# Patient Record
Sex: Female | Born: 1955
Health system: Southern US, Community
[De-identification: ages and names within clinical notes are randomized; demographics above are authoritative.]

## PROBLEM LIST (undated history)

## (undated) DIAGNOSIS — K5792 Diverticulitis of intestine, part unspecified, without perforation or abscess without bleeding: Secondary | ICD-10-CM

## (undated) DIAGNOSIS — K224 Dyskinesia of esophagus: Secondary | ICD-10-CM

## (undated) DIAGNOSIS — Z9882 Breast implant status: Secondary | ICD-10-CM

## (undated) DIAGNOSIS — R011 Cardiac murmur, unspecified: Secondary | ICD-10-CM

## (undated) DIAGNOSIS — E039 Hypothyroidism, unspecified: Secondary | ICD-10-CM

## (undated) DIAGNOSIS — F909 Attention-deficit hyperactivity disorder, unspecified type: Secondary | ICD-10-CM

## (undated) DIAGNOSIS — Z5189 Encounter for other specified aftercare: Secondary | ICD-10-CM

## (undated) DIAGNOSIS — G8929 Other chronic pain: Secondary | ICD-10-CM

## (undated) DIAGNOSIS — Z9889 Other specified postprocedural states: Secondary | ICD-10-CM

## (undated) DIAGNOSIS — I219 Acute myocardial infarction, unspecified: Secondary | ICD-10-CM

## (undated) DIAGNOSIS — F32A Depression, unspecified: Secondary | ICD-10-CM

## (undated) DIAGNOSIS — N3010 Interstitial cystitis (chronic) without hematuria: Secondary | ICD-10-CM

## (undated) DIAGNOSIS — M1990 Unspecified osteoarthritis, unspecified site: Secondary | ICD-10-CM

## (undated) DIAGNOSIS — G610 Guillain-Barre syndrome: Secondary | ICD-10-CM

## (undated) DIAGNOSIS — I341 Nonrheumatic mitral (valve) prolapse: Secondary | ICD-10-CM

## (undated) DIAGNOSIS — N189 Chronic kidney disease, unspecified: Secondary | ICD-10-CM

## (undated) DIAGNOSIS — F419 Anxiety disorder, unspecified: Secondary | ICD-10-CM

## (undated) DIAGNOSIS — T7840XA Allergy, unspecified, initial encounter: Secondary | ICD-10-CM

## (undated) DIAGNOSIS — I639 Cerebral infarction, unspecified: Secondary | ICD-10-CM

## (undated) DIAGNOSIS — I10 Essential (primary) hypertension: Secondary | ICD-10-CM

## (undated) DIAGNOSIS — K219 Gastro-esophageal reflux disease without esophagitis: Secondary | ICD-10-CM

## (undated) DIAGNOSIS — R519 Headache, unspecified: Secondary | ICD-10-CM

## (undated) DIAGNOSIS — D649 Anemia, unspecified: Secondary | ICD-10-CM

## (undated) DIAGNOSIS — F09 Unspecified mental disorder due to known physiological condition: Secondary | ICD-10-CM

## (undated) DIAGNOSIS — B019 Varicella without complication: Secondary | ICD-10-CM

## (undated) DIAGNOSIS — R51 Headache: Secondary | ICD-10-CM

## (undated) DIAGNOSIS — J45909 Unspecified asthma, uncomplicated: Secondary | ICD-10-CM

## (undated) HISTORY — DX: Unspecified osteoarthritis, unspecified site: M19.90

## (undated) HISTORY — DX: Interstitial cystitis (chronic) without hematuria: N30.10

## (undated) HISTORY — DX: Other chronic pain: G89.29

## (undated) HISTORY — DX: Anxiety disorder, unspecified: F41.9

## (undated) HISTORY — DX: Headache, unspecified: R51.9

## (undated) HISTORY — PX: BELPHAROPTOSIS REPAIR: SHX369

## (undated) HISTORY — DX: Breast implant status: Z98.82

## (undated) HISTORY — PX: CARPAL TUNNEL RELEASE: SHX101

## (undated) HISTORY — PX: ABDOMINAL HYSTERECTOMY: SHX81

## (undated) HISTORY — DX: Dyskinesia of esophagus: K22.4

## (undated) HISTORY — PX: BREAST SURGERY: SHX581

## (undated) HISTORY — DX: Essential (primary) hypertension: I10

## (undated) HISTORY — DX: Acute myocardial infarction, unspecified: I21.9

## (undated) HISTORY — DX: Guillain-Barre syndrome: G61.0

## (undated) HISTORY — PX: TONSILLECTOMY: SUR1361

## (undated) HISTORY — DX: Headache: R51

## (undated) HISTORY — DX: Allergy, unspecified, initial encounter: T78.40XA

## (undated) HISTORY — DX: Varicella without complication: B01.9

## (undated) HISTORY — PX: COSMETIC SURGERY: SHX468

## (undated) HISTORY — PX: EYE SURGERY: SHX253

## (undated) HISTORY — PX: COLON SURGERY: SHX602

## (undated) HISTORY — DX: Hypothyroidism, unspecified: E03.9

## (undated) HISTORY — PX: TUBAL LIGATION: SHX77

## (undated) HISTORY — DX: Encounter for other specified aftercare: Z51.89

## (undated) HISTORY — DX: Depression, unspecified: F32.A

## (undated) HISTORY — PX: SPINE SURGERY: SHX786

## (undated) HISTORY — PX: AUGMENTATION MAMMAPLASTY: SUR837

---

## 1980-05-14 DIAGNOSIS — G610 Guillain-Barre syndrome: Secondary | ICD-10-CM

## 1980-05-14 HISTORY — DX: Guillain-Barre syndrome: G61.0

## 1983-05-15 DIAGNOSIS — I341 Nonrheumatic mitral (valve) prolapse: Secondary | ICD-10-CM

## 1983-05-15 HISTORY — DX: Nonrheumatic mitral (valve) prolapse: I34.1

## 2005-05-14 HISTORY — PX: VESICO-VAGINAL FISTULA REPAIR: SHX5129

## 2005-05-14 HISTORY — PX: BOWEL RESECTION: SHX1257

## 2005-05-14 HISTORY — PX: SMALL INTESTINE SURGERY: SHX150

## 2005-05-14 HISTORY — PX: APPENDECTOMY: SHX54

## 2005-08-12 DIAGNOSIS — K224 Dyskinesia of esophagus: Secondary | ICD-10-CM | POA: Insufficient documentation

## 2010-05-14 HISTORY — PX: CHOLECYSTECTOMY: SHX55

## 2013-03-17 ENCOUNTER — Encounter: Payer: Self-pay | Admitting: Emergency Medicine

## 2013-03-17 ENCOUNTER — Emergency Department: Payer: Managed Care, Other (non HMO)

## 2013-03-17 ENCOUNTER — Emergency Department (INDEPENDENT_AMBULATORY_CARE_PROVIDER_SITE_OTHER): Payer: Managed Care, Other (non HMO)

## 2013-03-17 ENCOUNTER — Emergency Department (INDEPENDENT_AMBULATORY_CARE_PROVIDER_SITE_OTHER)
Admission: EM | Admit: 2013-03-17 | Discharge: 2013-03-17 | Disposition: A | Discharge status: Home / Self Care | Payer: Managed Care, Other (non HMO) | Source: Home / Self Care | Attending: Emergency Medicine | Admitting: Emergency Medicine

## 2013-03-17 DIAGNOSIS — R079 Chest pain, unspecified: Secondary | ICD-10-CM

## 2013-03-17 DIAGNOSIS — R1011 Right upper quadrant pain: Secondary | ICD-10-CM

## 2013-03-17 DIAGNOSIS — R1013 Epigastric pain: Secondary | ICD-10-CM

## 2013-03-17 DIAGNOSIS — K2970 Gastritis, unspecified, without bleeding: Secondary | ICD-10-CM

## 2013-03-17 HISTORY — DX: Nonrheumatic mitral (valve) prolapse: I34.1

## 2013-03-17 HISTORY — DX: Unspecified asthma, uncomplicated: J45.909

## 2013-03-17 HISTORY — DX: Diverticulitis of intestine, part unspecified, without perforation or abscess without bleeding: K57.92

## 2013-03-17 HISTORY — DX: Gastro-esophageal reflux disease without esophagitis: K21.9

## 2013-03-17 LAB — POCT CBC W AUTO DIFF (K'VILLE URGENT CARE)

## 2013-03-17 LAB — POCT URINALYSIS DIP (MANUAL ENTRY)
Bilirubin, UA: NEGATIVE
Blood, UA: NEGATIVE
Glucose, UA: NEGATIVE
Leukocytes, UA: NEGATIVE
Nitrite, UA: NEGATIVE
Protein Ur, POC: NEGATIVE
Spec Grav, UA: 1.02 (ref 1.005–1.03)
Urobilinogen, UA: 0.2 (ref 0–1)
pH, UA: 7.5 (ref 5–8)

## 2013-03-17 MED ORDER — PROMETHAZINE HCL 25 MG/ML IJ SOLN
50.0000 mg | Freq: Once | INTRAMUSCULAR | Status: AC
  Administered 2013-03-17: 50 mg via INTRAMUSCULAR

## 2013-03-17 MED ORDER — KETOROLAC TROMETHAMINE 60 MG/2ML IM SOLN
60.0000 mg | Freq: Once | INTRAMUSCULAR | Status: AC
  Administered 2013-03-17: 60 mg via INTRAMUSCULAR

## 2013-03-17 NOTE — ED Notes (Signed)
15 minutes after medication administration she became diaphoretic and pale. Feet elevated, cold compress applied and given beverage. BP 116/74, HR 62 02 98%. Within 5 minutes her color returned to normal and she felt much improved.

## 2013-03-17 NOTE — ED Notes (Signed)
Pt c/o RUQ abd pain radiating to her back, SOB, nausea, LT sided chest pain, HA, LT arm burning, numbness in her extremities x 3wks, worse x 1 day. She took flexeril this morning with no relief.
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
## 2013-03-18 LAB — COMPLETE METABOLIC PANEL WITH GFR
ALT: <8 U/L (ref 0–35)
AST: 14 U/L (ref 0–37)
Albumin: 4.1 g/dL (ref 3.5–5.2)
Alkaline Phosphatase: 65 U/L (ref 39–117)
BUN: 12 mg/dL (ref 6–23)
CO2: 27 mEq/L (ref 19–32)
Calcium: 9 mg/dL (ref 8.4–10.5)
Chloride: 101 mEq/L (ref 96–112)
Creat: 0.89 mg/dL (ref 0.50–1.10)
GFR, Est African American: 83 mL/min
GFR, Est Non African American: 72 mL/min
Glucose, Bld: 79 mg/dL (ref 70–99)
Potassium: 4 mEq/L (ref 3.5–5.3)
Sodium: 138 mEq/L (ref 135–145)
Total Bilirubin: 0.4 mg/dL (ref 0.3–1.2)
Total Protein: 7 g/dL (ref 6.0–8.3)

## 2013-03-18 LAB — AMYLASE: Amylase: 38 U/L (ref 0–105)

## 2013-03-18 LAB — LIPASE: Lipase: <10 U/L (ref 0–75)

## 2013-03-21 ENCOUNTER — Telehealth: Payer: Self-pay | Admitting: Emergency Medicine

## 2014-05-14 HISTORY — PX: CARDIAC CATHETERIZATION: SHX172

## 2015-08-19 ENCOUNTER — Ambulatory Visit (INDEPENDENT_AMBULATORY_CARE_PROVIDER_SITE_OTHER): Payer: 59 | Admitting: Primary Care

## 2015-08-19 ENCOUNTER — Encounter: Payer: Self-pay | Admitting: Primary Care

## 2015-08-19 VITALS — BP 142/82 | HR 81 | Temp 98.3°F | Ht 63.0 in | Wt 147.8 lb

## 2015-08-19 DIAGNOSIS — K224 Dyskinesia of esophagus: Secondary | ICD-10-CM | POA: Diagnosis not present

## 2015-08-19 DIAGNOSIS — G8929 Other chronic pain: Secondary | ICD-10-CM | POA: Insufficient documentation

## 2015-08-19 DIAGNOSIS — E039 Hypothyroidism, unspecified: Secondary | ICD-10-CM

## 2015-08-19 DIAGNOSIS — K219 Gastro-esophageal reflux disease without esophagitis: Secondary | ICD-10-CM | POA: Diagnosis not present

## 2015-08-19 DIAGNOSIS — R519 Headache, unspecified: Secondary | ICD-10-CM | POA: Insufficient documentation

## 2015-08-19 DIAGNOSIS — J45909 Unspecified asthma, uncomplicated: Secondary | ICD-10-CM | POA: Insufficient documentation

## 2015-08-19 DIAGNOSIS — I10 Essential (primary) hypertension: Secondary | ICD-10-CM

## 2015-08-19 DIAGNOSIS — R51 Headache: Secondary | ICD-10-CM

## 2015-08-19 DIAGNOSIS — E2839 Other primary ovarian failure: Secondary | ICD-10-CM

## 2015-08-19 DIAGNOSIS — F09 Unspecified mental disorder due to known physiological condition: Secondary | ICD-10-CM | POA: Insufficient documentation

## 2015-08-19 MED ORDER — AMLODIPINE BESYLATE 10 MG PO TABS: 10.0000 mg | ORAL_TABLET | Freq: Every day | ORAL | Status: DC

## 2015-08-19 MED ORDER — OLMESARTAN MEDOXOMIL 20 MG PO TABS: 20.0000 mg | ORAL_TABLET | Freq: Two times a day (BID) | ORAL | Status: DC

## 2015-08-19 MED ORDER — PANTOPRAZOLE SODIUM 40 MG PO TBEC: 40.0000 mg | DELAYED_RELEASE_TABLET | Freq: Two times a day (BID) | ORAL | Status: DC

## 2015-08-19 NOTE — Assessment & Plan Note (Signed)
Managed on levothyroxine 100 mcg. Last TSH normal in July 2017. Continue current dose.

## 2015-08-19 NOTE — Assessment & Plan Note (Signed)
Long history of, also with nutcracker esophagus. Managed on pantoprazole 40 mg BID as her symptoms are severe.

## 2015-08-19 NOTE — Assessment & Plan Note (Signed)
Managed per GYN on Estradiol since 2009. Complete hysterectomy in 2009.

## 2015-08-19 NOTE — Assessment & Plan Note (Signed)
Managed on Benicar BID and Amlodipine 5 mg daily. BP slightly above goal today, patient has not had second dose of Benicar today, also feeling anxious. Will continue to monitor.

## 2015-08-19 NOTE — Progress Notes (Signed)
Pre visit review using our clinic review tool, if applicable. No additional management support is needed unless otherwise documented below in the visit note. 

## 2015-08-19 NOTE — Assessment & Plan Note (Addendum)
Sounds to be uncontrolled as she's using her albuterol 1-2 times daily. Offered to try other long acting inhalers, she declines. Exam without wheezing today. Follows with Pulmonology in [place].
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
## 2015-08-19 NOTE — Patient Instructions (Signed)
Refills of your medication were sent to [facility].  I will obtain your records for review of your most recent labs.  It was a pleasure to meet you today! Please don't hesitate to call me with any questions. Welcome to [facility]!
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
## 2015-10-13 ENCOUNTER — Encounter: Payer: Self-pay | Admitting: *Deleted

## 2015-10-13 ENCOUNTER — Encounter: Payer: Self-pay | Admitting: Primary Care

## 2015-10-13 ENCOUNTER — Ambulatory Visit (INDEPENDENT_AMBULATORY_CARE_PROVIDER_SITE_OTHER): Payer: 59 | Admitting: Primary Care

## 2015-10-13 VITALS — BP 122/78 | HR 88 | Temp 97.8°F | Ht 63.0 in | Wt 146.1 lb

## 2015-10-13 DIAGNOSIS — R1011 Right upper quadrant pain: Secondary | ICD-10-CM

## 2015-10-13 DIAGNOSIS — J4540 Moderate persistent asthma, uncomplicated: Secondary | ICD-10-CM | POA: Diagnosis not present

## 2015-10-13 LAB — CBC WITH DIFFERENTIAL/PLATELET
Basophils Absolute: 0 10*3/uL (ref 0.0–0.1)
Basophils Relative: 0.5 % (ref 0.0–3.0)
Eosinophils Absolute: 0.1 10*3/uL (ref 0.0–0.7)
Eosinophils Relative: 1 % (ref 0.0–5.0)
HCT: 35.2 % — ABNORMAL LOW (ref 36.0–46.0)
Hemoglobin: 11.7 g/dL — ABNORMAL LOW (ref 12.0–15.0)
Lymphocytes Relative: 43.4 % (ref 12.0–46.0)
Lymphs Abs: 2.4 10*3/uL (ref 0.7–4.0)
MCHC: 33.4 g/dL (ref 30.0–36.0)
MCV: 93.1 fl (ref 78.0–100.0)
Monocytes Absolute: 0.4 10*3/uL (ref 0.1–1.0)
Monocytes Relative: 7.4 % (ref 3.0–12.0)
Neutro Abs: 2.6 10*3/uL (ref 1.4–7.7)
Neutrophils Relative %: 47.7 % (ref 43.0–77.0)
Platelets: 263 10*3/uL (ref 150.0–400.0)
RBC: 3.78 Mil/uL — ABNORMAL LOW (ref 3.87–5.11)
RDW: 14.6 % (ref 11.5–15.5)
WBC: 5.6 10*3/uL (ref 4.0–10.5)

## 2015-10-13 LAB — COMPREHENSIVE METABOLIC PANEL
ALT: 13 U/L (ref 0–35)
AST: 18 U/L (ref 0–37)
Albumin: 4.3 g/dL (ref 3.5–5.2)
Alkaline Phosphatase: 69 U/L (ref 39–117)
BUN: 17 mg/dL (ref 6–23)
CO2: 28 mEq/L (ref 19–32)
Calcium: 9.2 mg/dL (ref 8.4–10.5)
Chloride: 102 mEq/L (ref 96–112)
Creatinine, Ser: 1.14 mg/dL (ref 0.40–1.20)
GFR: 51.67 mL/min — ABNORMAL LOW (ref >60.00)
Glucose, Bld: 73 mg/dL (ref 70–99)
Potassium: 4.3 mEq/L (ref 3.5–5.1)
Sodium: 137 mEq/L (ref 135–145)
Total Bilirubin: 0.3 mg/dL (ref 0.2–1.2)
Total Protein: 7.4 g/dL (ref 6.0–8.3)

## 2015-10-13 LAB — LIPASE: Lipase: 8 U/L — ABNORMAL LOW (ref 11.0–59.0)

## 2015-10-13 MED ORDER — BUDESONIDE 180 MCG/ACT IN AEPB: 1.0000 | INHALATION_SPRAY | Freq: Two times a day (BID) | RESPIRATORY_TRACT | Status: DC

## 2015-10-13 NOTE — Progress Notes (Signed)
Pre visit review using our clinic review tool, if applicable. No additional management support is needed unless otherwise documented below in the visit note. 

## 2015-10-13 NOTE — Patient Instructions (Signed)
Complete lab work prior to leaving today. I will notify you of your results once received.   Start Pulmicort inhaler. Inhale 1 puff into the lungs twice daily every day for asthma.   Use the albuterol inhaler only as needed for wheezing and/or shortness of breath.  Please call me if you experience any reactions similar to the [facility]. I will call you for an update in several weeks.  It was a pleasure to see you today!  Asthma, Adult Asthma is a recurring condition in which the airways tighten and narrow. Asthma can make it difficult to breathe. It can cause coughing, wheezing, and shortness of breath. Asthma episodes, also called asthma attacks, range from minor to life-threatening. Asthma cannot be cured, but medicines and lifestyle changes can help control it. CAUSES Asthma is believed to be caused by inherited (genetic) and environmental factors, but its exact cause is unknown. Asthma may be triggered by allergens, lung infections, or irritants in the air. Asthma triggers are different for each person. Common triggers include:   Animal dander.  Dust mites.  Cockroaches.  Pollen from trees or grass.  Mold.  Smoke.  Air pollutants such as dust, household cleaners, hair sprays, aerosol sprays, paint fumes, strong chemicals, or strong odors.  Cold air, weather changes, and winds (which increase molds and pollens in the air).  Strong emotional expressions such as crying or laughing hard.  Stress.  Certain medicines (such as aspirin) or types of drugs (such as beta-blockers).  Sulfites in foods and drinks. Foods and drinks that may contain sulfites include dried fruit, potato chips, and sparkling grape juice.  Infections or inflammatory conditions such as the flu, a cold, or an inflammation of the nasal membranes (rhinitis).  Gastroesophageal reflux disease (GERD).  Exercise or strenuous activity. SYMPTOMS Symptoms may occur immediately after asthma is triggered or many hours  later. Symptoms include:  Wheezing.  Excessive nighttime or early morning coughing.  Frequent or severe coughing with a common cold.  Chest tightness.  Shortness of breath. DIAGNOSIS  The diagnosis of asthma is made by a review of your medical history and a physical exam. Tests may also be performed. These may include:  Lung function studies. These tests show how much air you breathe in and out.  Allergy tests.  Imaging tests such as X-rays. TREATMENT  Asthma cannot be cured, but it can usually be controlled. Treatment involves identifying and avoiding your asthma triggers. It also involves medicines. There are 2 classes of medicine used for asthma treatment:   Controller medicines. These prevent asthma symptoms from occurring. They are usually taken every day.  Reliever or rescue medicines. These quickly relieve asthma symptoms. They are used as needed and provide short-term relief. Your health care provider will help you create an asthma action plan. An asthma action plan is a written plan for managing and treating your asthma attacks. It includes a list of your asthma triggers and how they may be avoided. It also includes information on when medicines should be taken and when their dosage should be changed. An action plan may also involve the use of a device called a peak flow meter. A peak flow meter measures how well the lungs are working. It helps you monitor your condition. HOME CARE INSTRUCTIONS   Take medicines only as directed by your health care provider. Speak with your health care provider if you have questions about how or when to take the medicines.  Use a peak flow meter as directed by  your health care provider. Record and keep track of readings.  Understand and use the action plan to help minimize or stop an asthma attack without needing to seek medical care.  Control your home environment in the following ways to help prevent asthma attacks:  Do not smoke. Avoid  being exposed to secondhand smoke.  Change your heating and air conditioning filter regularly.  Limit your use of fireplaces and wood stoves.  Get rid of pests (such as roaches and mice) and their droppings.  Throw away plants if you see mold on them.  Clean your floors and dust regularly. Use unscented cleaning products.  Try to have someone else vacuum for you regularly. Stay out of rooms while they are being vacuumed and for a short while afterward. If you vacuum, use a dust mask from a hardware store, a double-layered or microfilter vacuum cleaner bag, or a vacuum cleaner with a HEPA filter.  Replace carpet with wood, tile, or vinyl flooring. Carpet can trap dander and dust.  Use allergy-proof pillows, mattress covers, and box spring covers.  Wash bed sheets and blankets every week in hot water and dry them in a dryer.  Use blankets that are made of polyester or cotton.  Clean bathrooms and kitchens with bleach. If possible, have someone repaint the walls in these rooms with mold-resistant paint. Keep out of the rooms that are being cleaned and painted.  Wash hands frequently. SEEK MEDICAL CARE IF:   You have wheezing, shortness of breath, or a cough even if taking medicine to prevent attacks.  The colored mucus you cough up (sputum) is thicker than usual.  Your sputum changes from clear or white to yellow, green, gray, or bloody.  You have any problems that may be related to the medicines you are taking (such as a rash, itching, swelling, or trouble breathing).  You are using a reliever medicine more than 2-3 times per week.  Your peak flow is still at 50-79% of your personal best after following your action plan for 1 hour.  You have a fever. SEEK IMMEDIATE MEDICAL CARE IF:   You seem to be getting worse and are unresponsive to treatment during an asthma attack.  You are short of breath even at rest.  You get short of breath when doing very little physical  activity.  You have difficulty eating, drinking, or talking due to asthma symptoms.  You develop chest pain.  You develop a fast heartbeat.  You have a bluish color to your lips or fingernails.  You are light-headed, dizzy, or faint.  Your peak flow is less than 50% of your personal best.   This information is not intended to replace advice given to you by your health care provider. Make sure you discuss any questions you have with your health care provider.   Document Released: 04/30/2005 Document Revised: 01/19/2015 Document Reviewed: 11/27/2012 Elsevier Interactive Patient Education [employer].

## 2015-10-13 NOTE — Assessment & Plan Note (Signed)
Uncontrolled, using albuterol inhaler 4 times daily. Discussed importance of controller inhaler, she is agreeable. No wheezing on exam. Will start with low dose Pulmicort twice daily as she had a reaction to Breo in the past. Will follow up with her in 4 weeks for re-evaluation.

## 2015-10-13 NOTE — Progress Notes (Signed)
Subjective:    Patient ID: Katie Robbins, female    DOB: 11-Feb-1956, 60 y.o.   MRN: [id number]  HPI  Katie Robbins is a 60 year old female who presents today with a chief complaint shortness of breath and feeling "off". She follows with pulmonology in [facility] and was provided with a prescription of Breo several weeks ago. She developed a rash and swelling to her neck after she used it for several times. She has since stopped using.   She's noticed an increase in her shortness of breath intermittently for the past several months. She will become short of breath with vacuuming her house some days, and no problem other days. She is currently using her albuterol inhaler 4 times daily.  She doesn't feel very well and would like basic lab work. Yesterday she noticed RUQ abdominal pain that was sharp and nagging in nature. Today she's feeling better. Denies nausea, vomiting, constipation, diarrhea, bloody stools. She had a cholecystomy in the past.   Review of Systems  Constitutional: Positive for fatigue. Negative for fever.  HENT: Negative for rhinorrhea.   Respiratory: Positive for shortness of breath and wheezing.   Cardiovascular: Negative for chest pain.  Gastrointestinal: Negative for nausea, vomiting, abdominal pain, diarrhea and constipation.  Neurological: Negative for dizziness.       Past Medical History  Diagnosis Date  . Asthma   . Diverticulitis   . Hypothyroidism   . Mitral prolapse 1985  . GERD (gastroesophageal reflux disease)   . Interstitial cystitis   . H/O breast implant   . Arthritis   . Chicken pox   . Chronic headaches   . Essential hypertension   . Nutcracker esophagus      Social History   Social History  . Marital Status: Married    Spouse Name: N/A  . Number of Children: N/A  . Years of Education: N/A   Occupational History  . Not on file.   Social History Main Topics  . Smoking status: Never Smoker   . Smokeless tobacco: Not on file  .  Alcohol Use: Yes  . Drug Use: No  . Sexual Activity: Not on file   Other Topics Concern  . Not on file   Social History Narrative    Past Surgical History  Procedure Laterality Date  . Cholecystectomy  2012  . Appendectomy  2007  . Abdominal hysterectomy  2000/2009  . Tonsillectomy    . Small intestine surgery  2007    Bowel resection  . Cholecystectomy  2012  . Cardiac catheterization  2016    Clean  . Vesico-vaginal fistula repair  2007    Family History  Problem Relation Age of Onset  . Hypertension Mother   . GER disease Mother     Allergies  Allergen Reactions  . Nitrofurantoin Hives  . Sulfamethoxazole-Trimethoprim Hives  . Vicodin [Hydrocodone-Acetaminophen] Swelling    Puffy eyes and face  . Bactrim [Sulfamethoxazole-Trimethoprim]   . Cefdinir     Other reaction(s): Other (See Comments) Headache  . Hydrocodone   . Macrobid [Nitrofurantoin Macrocrystal]   . Sulfa Antibiotics   . Erythromycin Rash    Current Outpatient Prescriptions on File Prior to Visit  Medication Sig Dispense Refill  . albuterol (PROAIR HFA) 108 (90 BASE) MCG/ACT inhaler Inhale into the lungs every 6 (six) hours as needed for wheezing or shortness of breath.    . ALPRAZolam (XANAX) 0.5 MG tablet Take 0.5 mg by mouth daily as needed.    Marland Kitchen  amLODipine (NORVASC) 10 MG tablet Take 1 tablet (10 mg total) by mouth daily. 30 tablet 5  . amphetamine-dextroamphetamine (ADDERALL) 20 MG tablet Taking 1/2 to 1 tablet 4 times a day needed    . cyanocobalamin (,VITAMIN B-12,) 1000 MCG/ML injection Inject into the muscle.    . estradiol (ESTRACE) 1 MG tablet Take 1 mg by mouth daily.    Marland Kitchen levothyroxine (SYNTHROID, LEVOTHROID) 100 MCG tablet Take 100 mcg by mouth daily before breakfast.    . olmesartan (BENICAR) 20 MG tablet Take 1 tablet (20 mg total) by mouth 2 (two) times daily. 60 tablet 5  . oxyCODONE-acetaminophen (PERCOCET) 7.5-500 MG per tablet Take 1 tablet by mouth every 4 (four) hours as  needed for pain.    . pantoprazole (PROTONIX) 40 MG tablet Take 1 tablet (40 mg total) by mouth 2 (two) times daily. 60 tablet 5   No current facility-administered medications on file prior to visit.    BP 122/78 mmHg  Pulse 88  Temp(Src) 97.8 F (36.6 C) (Oral)  Ht 5\' 3"  (1.6 m)  Wt 146 lb 1.9 oz (66.28 kg)  BMI 25.89 kg/m2  SpO2 99%    Objective:   Physical Exam  Constitutional: She appears well-nourished.  HENT:  Right Ear: Tympanic membrane and ear canal normal.  Left Ear: Tympanic membrane and ear canal normal.  Nose: Right sinus exhibits no maxillary sinus tenderness and no frontal sinus tenderness. Left sinus exhibits no maxillary sinus tenderness and no frontal sinus tenderness.  Mouth/Throat: Oropharynx is clear and moist.  Eyes: Conjunctivae are normal.  Neck: Neck supple.  Cardiovascular: Normal rate and regular rhythm.   Pulmonary/Chest: Effort normal and breath sounds normal. She has no wheezes. She has no rales.  Abdominal: Soft. Bowel sounds are normal. There is no tenderness. There is no guarding, no tenderness at McBurney's point and negative Murphy's sign.  Lymphadenopathy:    She has no cervical adenopathy.  Skin: Skin is warm and dry.          Assessment & Plan:  Abdominal Pain:  Located to RUQ x 1 day. Improved today. History of cholecystomy. History of abdominal abscess in the past without any symptoms, so she is nervous that that will re-occur, especially since she's not feeling well. It is reasonable to obtain basic lab work today as it has been several years. CBC, CMP, Lipase pending. Exam unremarkable.

## 2015-10-25 ENCOUNTER — Other Ambulatory Visit: Payer: Self-pay | Admitting: Primary Care

## 2015-10-25 NOTE — Telephone Encounter (Signed)
Received faxed refill request from [facility] for  Modified Magic Mouthwash  #[id number]  Take 1 tablespoonful (15 mL's) every 4 to 6 hours as needed for pain or spasms.   Prescription has never been by [facility] yet. Last seen on 10/13/2015. No future appt.

## 2015-10-26 ENCOUNTER — Telehealth: Payer: Self-pay | Admitting: Primary Care

## 2015-10-26 MED ORDER — MAGIC MOUTHWASH: 15.0000 mL | Freq: Three times a day (TID) | ORAL | Status: DC | PRN

## 2015-10-26 NOTE — Telephone Encounter (Signed)
See previous refill encounter 

## 2015-10-26 NOTE — Telephone Encounter (Signed)
Message left for patient to return my call.  

## 2015-10-26 NOTE — Telephone Encounter (Signed)
Spoken to patient and she stated that medication was for her esophagus spasm.  Called in Rx to [facility].

## 2015-10-26 NOTE — Telephone Encounter (Signed)
Pt is returning your call, please call after lunch [phone] Thanks

## 2015-10-26 NOTE — Addendum Note (Signed)
Addended by: Jacqualin Combes on: 10/26/2015 12:57 PM   Modules accepted: Medications

## 2015-10-27 ENCOUNTER — Telehealth: Payer: Self-pay | Admitting: Primary Care

## 2015-10-27 NOTE — Telephone Encounter (Signed)
Message left for patient to return my call.  

## 2015-10-27 NOTE — Telephone Encounter (Signed)
-----   Message from Pleas Koch, NP sent at 10/13/2015  9:26 AM EDT ----- Regarding: Asthma Please check on Katie Robbins since we started her on Pulmicort for her asthma. Has she noticed a difference?

## 2015-10-28 NOTE — Telephone Encounter (Signed)
Message left for patient to return my call.  

## 2015-11-14 ENCOUNTER — Encounter: Payer: Self-pay | Admitting: Internal Medicine

## 2015-11-14 ENCOUNTER — Ambulatory Visit (INDEPENDENT_AMBULATORY_CARE_PROVIDER_SITE_OTHER): Payer: 59 | Admitting: Internal Medicine

## 2015-11-14 VITALS — BP 138/80 | HR 87 | Temp 97.2°F | Wt 152.0 lb

## 2015-11-14 DIAGNOSIS — N3001 Acute cystitis with hematuria: Secondary | ICD-10-CM | POA: Diagnosis not present

## 2015-11-14 DIAGNOSIS — R1084 Generalized abdominal pain: Secondary | ICD-10-CM | POA: Diagnosis not present

## 2015-11-14 DIAGNOSIS — N3000 Acute cystitis without hematuria: Secondary | ICD-10-CM | POA: Insufficient documentation

## 2015-11-14 DIAGNOSIS — R109 Unspecified abdominal pain: Secondary | ICD-10-CM | POA: Insufficient documentation

## 2015-11-14 LAB — POC URINALSYSI DIPSTICK (AUTOMATED)
Bilirubin, UA: NEGATIVE
Glucose, UA: NEGATIVE
Ketones, UA: NEGATIVE
Nitrite, UA: NEGATIVE
Protein, UA: NEGATIVE
Spec Grav, UA: 1.02
Urobilinogen, UA: 4
pH, UA: 6

## 2015-11-14 MED ORDER — CIPROFLOXACIN HCL 250 MG PO TABS: 250.0000 mg | ORAL_TABLET | Freq: Two times a day (BID) | ORAL | Status: DC

## 2015-11-14 NOTE — Addendum Note (Signed)
Addended by: Pilar Grammes on: 11/14/2015 03:29 PM   Modules accepted: Orders, SmartSet

## 2015-11-14 NOTE — Progress Notes (Signed)
Pre visit review using our clinic review tool, if applicable. No additional management support is needed unless otherwise documented below in the visit note. 

## 2015-11-14 NOTE — Assessment & Plan Note (Signed)
Generalized Has functional pain but is worse Due for colonoscopy  Wants to change from [facility]--- will refer here

## 2015-11-14 NOTE — Progress Notes (Signed)
Subjective:    Patient ID: Katie Robbins, female    DOB: Sep 26, 1955, 60 y.o.   MRN: [id number]  HPI Here due to urinary symptoms With friend Idelle Crouch she has UTI Was away last week Noticed some visible blood this AM (trace) Had pain "that goes up" into bladder after voiding Not really painful when urine coming out No fever but feels sweaty No urgency or increased frequency Doesn't usually have post coital sex  Sees Dr Jeffie Pollock regularly Gets scopes for IC  Current Outpatient Prescriptions on File Prior to Visit  Medication Sig Dispense Refill  . albuterol (PROAIR HFA) 108 (90 BASE) MCG/ACT inhaler Inhale into the lungs every 6 (six) hours as needed for wheezing or shortness of breath.    . ALPRAZolam (XANAX) 0.5 MG tablet Take 0.5 mg by mouth daily as needed.    Marland Kitchen amLODipine (NORVASC) 10 MG tablet Take 1 tablet (10 mg total) by mouth daily. 30 tablet 5  . amphetamine-dextroamphetamine (ADDERALL) 20 MG tablet Taking 1/2 to 1 tablet 4 times a day needed    . budesonide (PULMICORT) 180 MCG/ACT inhaler Inhale 1 puff into the lungs 2 (two) times daily. 1 Inhaler 5  . cyanocobalamin (,VITAMIN B-12,) 1000 MCG/ML injection Inject into the muscle.    . estradiol (ESTRACE) 1 MG tablet Take 1 mg by mouth daily.    Marland Kitchen levothyroxine (SYNTHROID, LEVOTHROID) 100 MCG tablet Take 100 mcg by mouth daily before breakfast.    . magic mouthwash SOLN Take 15 mLs by mouth 3 (three) times daily as needed for mouth pain. (Patient taking differently: Take 15 mLs by mouth 3 (three) times daily as needed for mouth pain (Modified Magic Mouthwash). ) 360 mL 0  . olmesartan (BENICAR) 20 MG tablet Take 1 tablet (20 mg total) by mouth 2 (two) times daily. 60 tablet 5  . oxyCODONE-acetaminophen (PERCOCET) 7.5-500 MG per tablet Take 1 tablet by mouth every 4 (four) hours as needed for pain.    . pantoprazole (PROTONIX) 40 MG tablet Take 1 tablet (40 mg total) by mouth 2 (two) times daily. 60 tablet 5   No  current facility-administered medications on file prior to visit.    Allergies  Allergen Reactions  . Nitrofurantoin Hives  . Sulfamethoxazole-Trimethoprim Hives  . Vicodin [Hydrocodone-Acetaminophen] Swelling    Puffy eyes and face  . Bactrim [Sulfamethoxazole-Trimethoprim]   . Cefdinir     Other reaction(s): Other (See Comments) Headache  . Hydrocodone   . Macrobid [Nitrofurantoin Macrocrystal]   . Sulfa Antibiotics   . Erythromycin Rash    Past Medical History  Diagnosis Date  . Asthma   . Diverticulitis   . Hypothyroidism   . Mitral prolapse 1985  . GERD (gastroesophageal reflux disease)   . Interstitial cystitis   . H/O breast implant   . Arthritis   . Chicken pox   . Chronic headaches   . Essential hypertension   . Nutcracker esophagus     Past Surgical History  Procedure Laterality Date  . Cholecystectomy  2012  . Appendectomy  2007  . Abdominal hysterectomy  2000/2009  . Tonsillectomy    . Small intestine surgery  2007    Bowel resection  . Cholecystectomy  2012  . Cardiac catheterization  2016    Clean  . Vesico-vaginal fistula repair  2007    Family History  Problem Relation Age of Onset  . Hypertension Mother   . GER disease Mother     Social History  Social History  . Marital Status: Married    Spouse Name: N/A  . Number of Children: N/A  . Years of Education: N/A   Occupational History  . Not on file.   Social History Main Topics  . Smoking status: Never Smoker   . Smokeless tobacco: Not on file  . Alcohol Use: Yes  . Drug Use: No  . Sexual Activity: Not on file   Other Topics Concern  . Not on file   Social History Narrative   Review of Systems Feels bloated but no N/V Ovaries are out Appetite is okay    Objective:   Physical Exam  Abdominal: Soft. She exhibits no distension. There is no rebound and no guarding.  Moderate tenderness around entire lower abdomen  Musculoskeletal:  No CVA tenderness            Assessment & Plan:

## 2015-11-14 NOTE — Patient Instructions (Signed)
Please start the antibiotic today and take 2 doses if you can. If your symptoms are gone by tomorrow--you can stop after 3 days.

## 2015-11-14 NOTE — Assessment & Plan Note (Signed)
Fairly classic presentation for bladder infection Trace blood and pain Will treat with antibiotic

## 2015-11-22 ENCOUNTER — Other Ambulatory Visit: Payer: Self-pay | Admitting: Primary Care

## 2015-11-22 DIAGNOSIS — E538 Deficiency of other specified B group vitamins: Secondary | ICD-10-CM

## 2015-11-22 DIAGNOSIS — D649 Anemia, unspecified: Secondary | ICD-10-CM

## 2015-11-22 DIAGNOSIS — Z0000 Encounter for general adult medical examination without abnormal findings: Secondary | ICD-10-CM

## 2015-11-22 DIAGNOSIS — I10 Essential (primary) hypertension: Secondary | ICD-10-CM

## 2015-11-22 DIAGNOSIS — E039 Hypothyroidism, unspecified: Secondary | ICD-10-CM

## 2015-11-23 ENCOUNTER — Other Ambulatory Visit (INDEPENDENT_AMBULATORY_CARE_PROVIDER_SITE_OTHER): Payer: 59

## 2015-11-23 DIAGNOSIS — E538 Deficiency of other specified B group vitamins: Secondary | ICD-10-CM

## 2015-11-23 DIAGNOSIS — E039 Hypothyroidism, unspecified: Secondary | ICD-10-CM | POA: Diagnosis not present

## 2015-11-23 DIAGNOSIS — D649 Anemia, unspecified: Secondary | ICD-10-CM

## 2015-11-23 DIAGNOSIS — Z0000 Encounter for general adult medical examination without abnormal findings: Secondary | ICD-10-CM | POA: Diagnosis not present

## 2015-11-23 DIAGNOSIS — I10 Essential (primary) hypertension: Secondary | ICD-10-CM | POA: Diagnosis not present

## 2015-11-23 LAB — COMPREHENSIVE METABOLIC PANEL
ALT: 12 U/L (ref 0–35)
AST: 18 U/L (ref 0–37)
Albumin: 4.1 g/dL (ref 3.5–5.2)
Alkaline Phosphatase: 72 U/L (ref 39–117)
BUN: 18 mg/dL (ref 6–23)
CO2: 28 mEq/L (ref 19–32)
Calcium: 9.2 mg/dL (ref 8.4–10.5)
Chloride: 99 mEq/L (ref 96–112)
Creatinine, Ser: 1.12 mg/dL (ref 0.40–1.20)
GFR: 52.72 mL/min — ABNORMAL LOW (ref >60.00)
Glucose, Bld: 101 mg/dL — ABNORMAL HIGH (ref 70–99)
Potassium: 4.5 mEq/L (ref 3.5–5.1)
Sodium: 135 mEq/L (ref 135–145)
Total Bilirubin: 0.4 mg/dL (ref 0.2–1.2)
Total Protein: 7.4 g/dL (ref 6.0–8.3)

## 2015-11-23 LAB — CBC
HCT: 34.7 % — ABNORMAL LOW (ref 36.0–46.0)
Hemoglobin: 11.8 g/dL — ABNORMAL LOW (ref 12.0–15.0)
MCHC: 34 g/dL (ref 30.0–36.0)
MCV: 92.5 fl (ref 78.0–100.0)
Platelets: 266 10*3/uL (ref 150.0–400.0)
RBC: 3.75 Mil/uL — ABNORMAL LOW (ref 3.87–5.11)
RDW: 13.5 % (ref 11.5–15.5)
WBC: 6.6 10*3/uL (ref 4.0–10.5)

## 2015-11-23 LAB — LIPID PANEL
Cholesterol: 202 mg/dL — ABNORMAL HIGH (ref 0–200)
HDL: 78.4 mg/dL (ref >39.00)
LDL Cholesterol: 107 mg/dL — ABNORMAL HIGH (ref 0–99)
NonHDL: 123.72
Total CHOL/HDL Ratio: 3
Triglycerides: 85 mg/dL (ref 0.0–149.0)
VLDL: 17 mg/dL (ref 0.0–40.0)

## 2015-11-23 LAB — HEMOGLOBIN A1C: Hgb A1c MFr Bld: 5.4 % (ref 4.6–6.5)

## 2015-11-23 LAB — TSH: TSH: 4.64 u[IU]/mL — ABNORMAL HIGH (ref 0.35–4.50)

## 2015-11-23 LAB — VITAMIN B12: Vitamin B-12: 998 pg/mL — ABNORMAL HIGH (ref 211–911)

## 2015-11-28 ENCOUNTER — Ambulatory Visit (INDEPENDENT_AMBULATORY_CARE_PROVIDER_SITE_OTHER): Payer: 59 | Admitting: Primary Care

## 2015-11-28 ENCOUNTER — Encounter: Payer: Self-pay | Admitting: Primary Care

## 2015-11-28 VITALS — BP 146/84 | HR 86 | Temp 98.3°F | Ht 63.0 in | Wt 149.8 lb

## 2015-11-28 DIAGNOSIS — K224 Dyskinesia of esophagus: Secondary | ICD-10-CM

## 2015-11-28 DIAGNOSIS — J45909 Unspecified asthma, uncomplicated: Secondary | ICD-10-CM

## 2015-11-28 DIAGNOSIS — Z0000 Encounter for general adult medical examination without abnormal findings: Secondary | ICD-10-CM | POA: Insufficient documentation

## 2015-11-28 DIAGNOSIS — K219 Gastro-esophageal reflux disease without esophagitis: Secondary | ICD-10-CM

## 2015-11-28 DIAGNOSIS — Z0100 Encounter for examination of eyes and vision without abnormal findings: Secondary | ICD-10-CM

## 2015-11-28 DIAGNOSIS — I10 Essential (primary) hypertension: Secondary | ICD-10-CM

## 2015-11-28 DIAGNOSIS — E039 Hypothyroidism, unspecified: Secondary | ICD-10-CM

## 2015-11-28 DIAGNOSIS — Z23 Encounter for immunization: Secondary | ICD-10-CM

## 2015-11-28 MED ORDER — AMLODIPINE BESYLATE 10 MG PO TABS: 10.0000 mg | ORAL_TABLET | Freq: Every day | ORAL | Status: DC

## 2015-11-28 MED ORDER — ZOSTER VACCINE LIVE 19400 UNT/0.65ML ~~LOC~~ SUSR: 0.6500 mL | Freq: Once | SUBCUTANEOUS | Status: DC

## 2015-11-28 MED ORDER — LEVOTHYROXINE SODIUM 112 MCG PO TABS: 112.0000 ug | ORAL_TABLET | Freq: Every day | ORAL | Status: DC

## 2015-11-28 NOTE — Assessment & Plan Note (Signed)
Stable today in clinic. Continue Benicar and amlodipine.

## 2015-11-28 NOTE — Patient Instructions (Addendum)
You will be contacted regarding your referral to GI and Optometry.  Please let us know if you have not heard back within one week.   Take the Shingles vaccination prescription to the pharmacy for administration. I recommend this as it will prevent shingles.  Complete your mammogram as scheduled.  We've increased your levothyroxine from 100 mcg to 112 mcg. Take 1 tablet by mouth daily, on an empty stomach, with a full glass of water.  Schedule a lab only appointment in 6 weeks for recheck of your Thyroid and Kidney Function.  Follow up in 1 year for repeat physical or sooner if needed.  It was a pleasure to see you today!

## 2015-11-28 NOTE — Assessment & Plan Note (Signed)
TD up-to-date. Prescription for Zostavax printed and handed to patient. Fair diet, does not routinely exercise. Discussed the importance of a healthy diet and regular exercise in order for weight loss and to reduce risk of other medical diseases. Follows with GYN annually for mammogram and annual pelvic exam. Colonoscopy up-to-date per GI. Labs stable. Exam unremarkable. Repeat TSH in 6 weeks. Follow-up in one year.

## 2015-11-28 NOTE — Assessment & Plan Note (Signed)
Would like to establish with GI closer to home as she currently drives to [facility]. Referral placed to GI in [facility] for her to establish.

## 2015-11-28 NOTE — Progress Notes (Signed)
Subjective:    Patient ID: Katie Robbins, female    DOB: 20-Nov-1955, 60 y.o.   MRN: [id number]  HPI  Katie Robbins is a 60 year old female who presents today for complete physical.  Immunizations: -Tetanus: Completed in October 2016 -Influenza: Did not complete last season -Shingles: Has never completed.   Diet: She endorses a healthy diet. Breakfast: Eggs and rice/noodles, fruit, tea Lunch: Left overs, fruit Dinner: Chicken, steak, vegetables, pasta Snacks: None Desserts: Rarely Beverages: Water, occasional ginger ale  Exercise: She does not currently exercise Eye exam: Completed several years ago. Has noticed changes in vision, has not established with optometrist.  Dental exam: Completes semi-annually Colonoscopy: Completed. Up to date. Follows with [facility], would like to see GI more locally.  Pap Smear: Hysterectomy, follows with GYN. Mammogram: Due in August, follows with GYN.   Review of Systems  Constitutional: Positive for fatigue. Negative for unexpected weight change.  HENT: Negative for rhinorrhea and sore throat.   Respiratory: Negative for cough and shortness of breath.   Cardiovascular: Negative for chest pain.  Gastrointestinal: Negative for diarrhea and constipation.       Recent diarrhea and bloating with improvements after probiotics.   Genitourinary: Negative for difficulty urinating and menstrual problem.  Musculoskeletal: Negative for myalgias and arthralgias.  Skin:       Mild redness to anterior neck  Allergic/Immunologic: Negative for environmental allergies.  Neurological: Negative for dizziness, numbness and headaches.  Psychiatric/Behavioral:       Denies concerns for anxiety or depression.       Past Medical History  Diagnosis Date  . Asthma   . Diverticulitis   . Hypothyroidism   . Mitral prolapse 1985  . GERD (gastroesophageal reflux disease)   . Interstitial cystitis   . H/O breast implant   . Arthritis   . Chicken  pox   . Chronic headaches   . Essential hypertension   . Nutcracker esophagus      Social History   Social History  . Marital Status: Married    Spouse Name: N/A  . Number of Children: N/A  . Years of Education: N/A   Occupational History  . Not on file.   Social History Main Topics  . Smoking status: Never Smoker   . Smokeless tobacco: Not on file  . Alcohol Use: Yes  . Drug Use: No  . Sexual Activity: Not on file   Other Topics Concern  . Not on file   Social History Narrative    Past Surgical History  Procedure Laterality Date  . Cholecystectomy  2012  . Appendectomy  2007  . Abdominal hysterectomy  2000/2009  . Tonsillectomy    . Small intestine surgery  2007    Bowel resection  . Cholecystectomy  2012  . Cardiac catheterization  2016    Clean  . Vesico-vaginal fistula repair  2007    Family History  Problem Relation Age of Onset  . Hypertension Mother   . GER disease Mother     Allergies  Allergen Reactions  . Nitrofurantoin Hives  . Sulfamethoxazole-Trimethoprim Hives  . Vicodin [Hydrocodone-Acetaminophen] Swelling    Puffy eyes and face  . Bactrim [Sulfamethoxazole-Trimethoprim]   . Cefdinir     Other reaction(s): Other (See Comments) Headache  . Hydrocodone   . Macrobid [Nitrofurantoin Macrocrystal]   . Sulfa Antibiotics   . Erythromycin Rash    Current Outpatient Prescriptions on File Prior to Visit  Medication Sig Dispense Refill  .  albuterol (PROAIR HFA) 108 (90 BASE) MCG/ACT inhaler Inhale into the lungs every 6 (six) hours as needed for wheezing or shortness of breath.    . ALPRAZolam (XANAX) 0.5 MG tablet Take 0.5 mg by mouth daily as needed.    Marland Kitchen amphetamine-dextroamphetamine (ADDERALL) 20 MG tablet Taking 1/2 to 1 tablet 4 times a day needed    . budesonide (PULMICORT) 180 MCG/ACT inhaler Inhale 1 puff into the lungs 2 (two) times daily. 1 Inhaler 5  . cyanocobalamin (,VITAMIN B-12,) 1000 MCG/ML injection Inject into the muscle.     . estradiol (ESTRACE) 1 MG tablet Take 1 mg by mouth daily.    . magic mouthwash SOLN Take 15 mLs by mouth 3 (three) times daily as needed for mouth pain. (Patient taking differently: Take 15 mLs by mouth 3 (three) times daily as needed for mouth pain (Modified Magic Mouthwash). ) 360 mL 0  . olmesartan (BENICAR) 20 MG tablet Take 1 tablet (20 mg total) by mouth 2 (two) times daily. 60 tablet 5  . oxyCODONE-acetaminophen (PERCOCET) 7.5-500 MG per tablet Take 1 tablet by mouth every 4 (four) hours as needed for pain.    . pantoprazole (PROTONIX) 40 MG tablet Take 1 tablet (40 mg total) by mouth 2 (two) times daily. 60 tablet 5   No current facility-administered medications on file prior to visit.    BP 146/84 mmHg  Pulse 86  Temp(Src) 98.3 F (36.8 C) (Oral)  Ht 5\' 3"  (1.6 m)  Wt 149 lb 12.8 oz (67.949 kg)  BMI 26.54 kg/m2  SpO2 98%    Objective:   Physical Exam  Constitutional: She is oriented to person, place, and time. She appears well-nourished.  HENT:  Right Ear: Tympanic membrane and ear canal normal.  Left Ear: Tympanic membrane and ear canal normal.  Nose: Nose normal.  Mouth/Throat: Oropharynx is clear and moist.  Eyes: Conjunctivae and EOM are normal. Pupils are equal, round, and reactive to light.  Neck: Neck supple. No thyromegaly present.  Cardiovascular: Normal rate and regular rhythm.   No murmur heard. Pulmonary/Chest: Effort normal and breath sounds normal. She has no rales.  Abdominal: Soft. Bowel sounds are normal. There is no tenderness.  Musculoskeletal: Normal range of motion.  Lymphadenopathy:    She has no cervical adenopathy.  Neurological: She is alert and oriented to person, place, and time. She has normal reflexes. No cranial nerve deficit.  Skin: Skin is warm and dry. No rash noted.  Very mild erythema to anterior neck, appears that she has been rubbing.  Psychiatric: She has a normal mood and affect.          Assessment & Plan:

## 2015-11-28 NOTE — Assessment & Plan Note (Signed)
Much improvement since initiation of Pulmicort. No wheezing on exam. Continue Pulmicort.

## 2015-11-28 NOTE — Progress Notes (Signed)
Pre visit review using our clinic review tool, if applicable. No additional management support is needed unless otherwise documented below in the visit note. 

## 2015-11-28 NOTE — Assessment & Plan Note (Signed)
TSH slightly above goal on current regimen of levothyroxine 100 g. Given symptoms of fatigue and hair thinning we will increase dose to 112 g daily. Repeat TSH in 6 weeks.

## 2016-01-05 ENCOUNTER — Other Ambulatory Visit: Payer: Self-pay | Admitting: Primary Care

## 2016-01-05 DIAGNOSIS — I10 Essential (primary) hypertension: Secondary | ICD-10-CM

## 2016-01-09 ENCOUNTER — Other Ambulatory Visit (INDEPENDENT_AMBULATORY_CARE_PROVIDER_SITE_OTHER): Payer: 59

## 2016-01-09 DIAGNOSIS — K224 Dyskinesia of esophagus: Secondary | ICD-10-CM | POA: Diagnosis not present

## 2016-01-09 DIAGNOSIS — E039 Hypothyroidism, unspecified: Secondary | ICD-10-CM

## 2016-01-09 DIAGNOSIS — I10 Essential (primary) hypertension: Secondary | ICD-10-CM | POA: Diagnosis not present

## 2016-01-09 LAB — RENAL FUNCTION PANEL
Albumin: 4.1 g/dL (ref 3.5–5.2)
BUN: 17 mg/dL (ref 6–23)
CO2: 28 mEq/L (ref 19–32)
Calcium: 8.9 mg/dL (ref 8.4–10.5)
Chloride: 102 mEq/L (ref 96–112)
Creatinine, Ser: 1.07 mg/dL (ref 0.40–1.20)
GFR: 55.54 mL/min — ABNORMAL LOW (ref >60.00)
Glucose, Bld: 101 mg/dL — ABNORMAL HIGH (ref 70–99)
Phosphorus: 4.5 mg/dL (ref 2.3–4.6)
Potassium: 4.2 mEq/L (ref 3.5–5.1)
Sodium: 137 mEq/L (ref 135–145)

## 2016-01-09 LAB — TSH: TSH: 4.12 u[IU]/mL (ref 0.35–4.50)

## 2016-01-19 ENCOUNTER — Encounter: Payer: Self-pay | Admitting: Primary Care

## 2016-01-30 ENCOUNTER — Encounter: Payer: Self-pay | Admitting: Primary Care

## 2016-02-22 ENCOUNTER — Other Ambulatory Visit: Payer: Self-pay | Admitting: Primary Care

## 2016-02-22 MED ORDER — MAGIC MOUTHWASH
15.0000 mL | Freq: Three times a day (TID) | ORAL | 0 refills | Status: DC | PRN
Start: 2016-02-22 — End: 2016-11-21

## 2016-02-22 NOTE — Telephone Encounter (Signed)
Received faxed refill request for  Modified magic mouthwash   Last prescribed on 10/26/2015. Last seen on 11/28/2015. Next CPE on 11/28/2016.

## 2016-02-23 NOTE — Telephone Encounter (Signed)
Called in medication to the pharmacy as instructed. 

## 2016-03-06 ENCOUNTER — Other Ambulatory Visit: Payer: Self-pay | Admitting: Primary Care

## 2016-03-06 DIAGNOSIS — I10 Essential (primary) hypertension: Secondary | ICD-10-CM

## 2016-04-11 ENCOUNTER — Encounter: Payer: Self-pay | Admitting: Primary Care

## 2016-04-11 ENCOUNTER — Ambulatory Visit (INDEPENDENT_AMBULATORY_CARE_PROVIDER_SITE_OTHER): Payer: 59 | Admitting: Primary Care

## 2016-04-11 VITALS — BP 148/98 | HR 81 | Temp 98.1°F | Ht 63.0 in | Wt 150.0 lb

## 2016-04-11 DIAGNOSIS — M5010 Cervical disc disorder with radiculopathy, unspecified cervical region: Secondary | ICD-10-CM

## 2016-04-11 MED ORDER — PREDNISONE 20 MG PO TABS: ORAL_TABLET | ORAL | 0 refills | Status: DC

## 2016-04-11 MED ORDER — BUDESONIDE 180 MCG/ACT IN AEPB: 1.0000 | INHALATION_SPRAY | Freq: Two times a day (BID) | RESPIRATORY_TRACT | 5 refills | Status: DC

## 2016-04-11 NOTE — Progress Notes (Signed)
   Subjective:    Patient ID: Katie Robbins, female    DOB: 12-Jan-1956, 60 y.o.   MRN: [id number]  HPI  Katie Robbins is a 60 year old female with history of chronic neck pain, chronic headaches, TBI who presents today with a chief complaint of neck pain and swelling. Her symptoms are located to the bilateral lower neck that have been present intermittently since September 2017. She's also experiencing numbness/tingling to her upper and lower extremities. Simple activities like yawning and brushing her teeth will cause neck pain with numbness/tigling. She denies recent injury/trauma.   She was following with a neurologist in [facility] and once received injections to her neck with improvement. She is following with pain management who ordered a CT/MRI of her neck and chest in November 2017. MRI with evidence of cervical disc degeneration with numerous bulging discs. She was told that she needed to start swimming and exercising her neck in response to the swelling.  She would like to see a local specialist for her symptoms.   Review of Systems  Constitutional: Negative for fever.  Musculoskeletal: Positive for neck pain and neck stiffness.       Neck swelling  Neurological: Positive for numbness.       Objective:   Physical Exam  Constitutional: She is oriented to person, place, and time. She appears well-nourished.  Eyes: EOM are normal. Pupils are equal, round, and reactive to light.  Neck: Muscular tenderness present. No spinous process tenderness present. No neck rigidity. Decreased range of motion present.    Moderate swelling to bilateral lower neck/shoulders  Cardiovascular: Normal rate and regular rhythm.   Pulmonary/Chest: Effort normal and breath sounds normal.  Neurological: She is alert and oriented to person, place, and time. No cranial nerve deficit.  Skin: Skin is warm and dry.          Assessment & Plan:

## 2016-04-11 NOTE — Patient Instructions (Signed)
You will be contacted regarding your referral to Neurosurgery.  Please let us know if you have not heard back within one week.   Start Prednisone. Take 3 tablets for 3 days, then 2 tablets for 3 days, then 1 tablet for 3 days.  It was a pleasure to see you today!

## 2016-04-11 NOTE — Progress Notes (Signed)
Pre visit review using our clinic review tool, if applicable. No additional management support is needed unless otherwise documented below in the visit note. 

## 2016-04-12 DIAGNOSIS — G8929 Other chronic pain: Secondary | ICD-10-CM | POA: Insufficient documentation

## 2016-04-12 DIAGNOSIS — M5010 Cervical disc disorder with radiculopathy, unspecified cervical region: Secondary | ICD-10-CM | POA: Insufficient documentation

## 2016-04-12 NOTE — Assessment & Plan Note (Addendum)
Recent MRI with cervical disc disease and bulging discs. Swelling doesn't appear to be muscle spasm upon palpation. Moderate to severe decrease in ROM to neck. Will treat with prednisone taper given radiculopathy. Referral placed to neurosurgery for further evaluation of disc disease and symptoms.

## 2016-04-16 ENCOUNTER — Other Ambulatory Visit: Payer: Self-pay | Admitting: Primary Care

## 2016-04-16 DIAGNOSIS — K219 Gastro-esophageal reflux disease without esophagitis: Secondary | ICD-10-CM

## 2016-04-20 ENCOUNTER — Telehealth: Payer: Self-pay

## 2016-04-20 DIAGNOSIS — M5010 Cervical disc disorder with radiculopathy, unspecified cervical region: Secondary | ICD-10-CM

## 2016-04-20 NOTE — Telephone Encounter (Signed)
Spoken and notified patient of [name]'s comments. Patient verbalized understanding.  Patient stated that she does not want to add anything else. She stated she will be okay then, she does have some pain medication if needed. Patient stated no appointment has been made yet.

## 2016-04-20 NOTE — Telephone Encounter (Signed)
Pt left v/m;pt was seen 04/11/16; pt finished prednisone dose pak 04/19/16. The first day of taking one pill of prednisone pt said she began to feel worse again. Now pt is in a lot of pain (pt has pain medication) and feeling miserable. Pt wants to know if could get refill of prednisone. Pt request cb. [facility].

## 2016-04-20 NOTE — Telephone Encounter (Signed)
Please notify patient that it's not safe to be on prednisone longer than she's already done. We can try something like diclofenac (NSAID) and muscle relaxer like robaxin. I see she's had a reaction to Tizanidine before. When is she seeing neurosurgery?

## 2016-04-20 NOTE — Telephone Encounter (Signed)
Katie Robbins, will you please check on the status of her neurosurgery referral? Please update patient. Thanks.

## 2016-04-25 ENCOUNTER — Telehealth: Payer: Self-pay | Admitting: Primary Care

## 2016-04-25 NOTE — Telephone Encounter (Signed)
Opened in error

## 2016-04-25 NOTE — Telephone Encounter (Signed)
Spoke with pt she is aware she has an appointment with Dr Saintclair Halsted @ [facility] Tuesday 05/01/16 @ 10:45.  She is also aware that if she gets worse to go to er that dr cram is on call today.  She stated she didn't want any additional meds  She has valium and percet   Pt wanted to thank you so much for all your help

## 2016-04-25 NOTE — Telephone Encounter (Signed)
Pt called checking on referral  She stated her previous md mailed records on 12/6.  If you receive any of pt records please give to me or marion  So we can send to neurosurgery to get her an appointment.  I also [facility] she stated she was getting worse

## 2016-04-25 NOTE — Telephone Encounter (Signed)
Patient Name: Katie Robbins DOB: 23-Feb-1956 Initial Comment having almost constant numbness in her extremities, swelling in throat and neck, headache, Nurse Assessment Nurse: Ronnald Ramp, RN, [facility] Date/Time (Eastern Time): 04/25/2016 10:03:10 AM Confirm and document reason for call. If symptomatic, describe symptoms. ---[name] states she has chronic neck pain and swelling. She has been referred to Neurosurgery but still waiting for paper work from previous treating doctor. She has continued to have numbness in her extremities. Does the patient have any new or worsening symptoms? ---Yes Will a triage be completed? ---No Select reason for no triage. ---Patient declined Please document clinical information provided and list any resource used. ---[name] She has been treated for this and had pain medication. She is mainly calling to see if there is any way to speed up the referral to the Neurosurgery. She also mentioned that she felt better when she was treated with Prednisone but she understands if [name] does not want to represcribe that. She is anxious about her symptoms worsening and how long she may have to wait for referral. Told caller I would forward the message to the provider to see if there were any further recommendations. Guidelines Guideline Title Affirmed Question Affirmed Notes Final Disposition User Clinical Call Ronnald Ramp, RN, [employer]

## 2016-04-25 NOTE — Telephone Encounter (Signed)
Noted. Spoke with Shirlean Mylar who is checking in on status of records for referral.

## 2016-04-25 NOTE — Telephone Encounter (Signed)
Noted  

## 2016-05-01 DIAGNOSIS — M5030 Other cervical disc degeneration, unspecified cervical region: Secondary | ICD-10-CM | POA: Insufficient documentation

## 2016-05-16 DIAGNOSIS — M9981 Other biomechanical lesions of cervical region: Secondary | ICD-10-CM | POA: Diagnosis not present

## 2016-05-16 DIAGNOSIS — G5603 Carpal tunnel syndrome, bilateral upper limbs: Secondary | ICD-10-CM | POA: Diagnosis not present

## 2016-05-16 DIAGNOSIS — G5600 Carpal tunnel syndrome, unspecified upper limb: Secondary | ICD-10-CM | POA: Insufficient documentation

## 2016-05-16 DIAGNOSIS — M4800 Spinal stenosis, site unspecified: Secondary | ICD-10-CM | POA: Insufficient documentation

## 2016-05-23 DIAGNOSIS — R300 Dysuria: Secondary | ICD-10-CM | POA: Diagnosis not present

## 2016-05-23 DIAGNOSIS — N3010 Interstitial cystitis (chronic) without hematuria: Secondary | ICD-10-CM | POA: Diagnosis not present

## 2016-05-23 DIAGNOSIS — R102 Pelvic and perineal pain: Secondary | ICD-10-CM | POA: Diagnosis not present

## 2016-06-05 DIAGNOSIS — M542 Cervicalgia: Secondary | ICD-10-CM | POA: Diagnosis not present

## 2016-06-05 DIAGNOSIS — G5603 Carpal tunnel syndrome, bilateral upper limbs: Secondary | ICD-10-CM | POA: Diagnosis not present

## 2016-06-05 DIAGNOSIS — M5023 Other cervical disc displacement, cervicothoracic region: Secondary | ICD-10-CM | POA: Diagnosis not present

## 2016-06-09 DIAGNOSIS — Z01812 Encounter for preprocedural laboratory examination: Secondary | ICD-10-CM | POA: Diagnosis not present

## 2016-06-11 DIAGNOSIS — M5011 Cervical disc disorder with radiculopathy,  high cervical region: Secondary | ICD-10-CM | POA: Diagnosis not present

## 2016-06-11 DIAGNOSIS — G5601 Carpal tunnel syndrome, right upper limb: Secondary | ICD-10-CM | POA: Diagnosis not present

## 2016-06-11 DIAGNOSIS — M4803 Spinal stenosis, cervicothoracic region: Secondary | ICD-10-CM | POA: Diagnosis not present

## 2016-06-11 DIAGNOSIS — M4722 Other spondylosis with radiculopathy, cervical region: Secondary | ICD-10-CM | POA: Diagnosis not present

## 2016-06-11 DIAGNOSIS — M47892 Other spondylosis, cervical region: Secondary | ICD-10-CM | POA: Diagnosis not present

## 2016-06-11 HISTORY — PX: NECK SURGERY: SHX720

## 2016-07-17 DIAGNOSIS — M5023 Other cervical disc displacement, cervicothoracic region: Secondary | ICD-10-CM | POA: Diagnosis not present

## 2016-08-28 DIAGNOSIS — M542 Cervicalgia: Secondary | ICD-10-CM | POA: Diagnosis not present

## 2016-08-31 ENCOUNTER — Other Ambulatory Visit: Payer: Self-pay | Admitting: Primary Care

## 2016-08-31 DIAGNOSIS — I10 Essential (primary) hypertension: Secondary | ICD-10-CM

## 2016-09-05 ENCOUNTER — Encounter: Payer: Self-pay | Admitting: Primary Care

## 2016-09-05 ENCOUNTER — Ambulatory Visit (INDEPENDENT_AMBULATORY_CARE_PROVIDER_SITE_OTHER): Payer: 59 | Admitting: Primary Care

## 2016-09-05 VITALS — BP 156/84 | HR 86 | Temp 97.7°F | Ht 63.0 in | Wt 150.1 lb

## 2016-09-05 DIAGNOSIS — M069 Rheumatoid arthritis, unspecified: Secondary | ICD-10-CM

## 2016-09-05 DIAGNOSIS — M2550 Pain in unspecified joint: Secondary | ICD-10-CM | POA: Diagnosis not present

## 2016-09-05 DIAGNOSIS — M2540 Effusion, unspecified joint: Secondary | ICD-10-CM

## 2016-09-05 MED ORDER — PREDNISONE 10 MG PO TABS: ORAL_TABLET | ORAL | 0 refills | Status: DC

## 2016-09-05 NOTE — Progress Notes (Signed)
Pre visit review using our clinic review tool, if applicable. No additional management support is needed unless otherwise documented below in the visit note. 

## 2016-09-05 NOTE — Patient Instructions (Addendum)
Start prednisone tablets. Take three tablets for 3 days, then two tablets for 3 days, then one tablet for 5 days.  Refrain from taking any Ibuprofen (Motrin, Advil), naproxen (Aleve) medications while taking prednisone.   It was a pleasure to see you today!

## 2016-09-05 NOTE — Assessment & Plan Note (Signed)
Long history of of RA, infrequent flares overall. Exam today consistent of acute flare. Discussed effects of frequent/long term prednisone use and discouraged this. She agrees ans would just like doses for infrequent flares. Will agree to provide dose today given level of inflammation and failure on Rx NSAID's. Will monitor frequency and send to rheumatology if flares become recurrent.

## 2016-09-05 NOTE — Progress Notes (Signed)
Subjective:    Patient ID: Katie Robbins, female    DOB: 03-16-1956, 61 y.o.   MRN: [id number]  HPI  [name] is a 61 year old female with a history of chronic neck pain and rheumatoid arthritis who presents today with a chief complaint of arthritis. She underwent surgical intervention to her C3-7 in December 2017. She does wear a c-collar intermittently. She does continue to experience some pain to her neck which is mostly muscular.   She thinks that her RA has been acting up for the past 4-6 months as she will wake up with full body pain and moderate swelling to the MCP joints of her bilateral hands. She followed with a rheumatologist in [place] in the past and was managed on numerous medications (Voltaren cream, Meloxicam, etc) without much improvement. She does wear braces intermittently with improvement. She felt really well when she was managed on prednisone for her visit for acute neck pain in December 2017. Most of her pain is to the bilateral hips, knees, hands, elbows.   Review of Systems  Musculoskeletal: Positive for arthralgias and joint swelling.  Neurological: Negative for numbness.       Past Medical History:  Diagnosis Date  . Arthritis   . Asthma   . Chicken pox   . Chronic headaches   . Diverticulitis   . Essential hypertension   . GERD (gastroesophageal reflux disease)   . H/O breast implant   . Hypothyroidism   . Interstitial cystitis   . Mitral prolapse 1985  . Nutcracker esophagus      Social History   Social History  . Marital status: Married    Spouse name: N/A  . Number of children: N/A  . Years of education: N/A   Occupational History  . Not on file.   Social History Main Topics  . Smoking status: Never Smoker  . Smokeless tobacco: Never Used  . Alcohol use Yes  . Drug use: No  . Sexual activity: Not on file   Other Topics Concern  . Not on file   Social History Narrative  . No narrative on file    Past Surgical History:    Procedure Laterality Date  . ABDOMINAL HYSTERECTOMY  2000/2009  . APPENDECTOMY  2007  . CARDIAC CATHETERIZATION  2016   Clean  . CHOLECYSTECTOMY  2012  . CHOLECYSTECTOMY  2012  . SMALL INTESTINE SURGERY  2007   Bowel resection  . TONSILLECTOMY    . VESICO-VAGINAL FISTULA REPAIR  2007    Family History  Problem Relation Age of Onset  . Hypertension Mother   . GER disease Mother     Allergies  Allergen Reactions  . Nitrofurantoin Hives  . Sulfamethoxazole-Trimethoprim Hives  . Tizanidine Other (See Comments)    Bad Mood changs  . Vicodin [Hydrocodone-Acetaminophen] Swelling    Puffy eyes and face  . Bactrim [Sulfamethoxazole-Trimethoprim]   . Cefdinir     Other reaction(s): Other (See Comments) Headache  . Hydrocodone   . Macrobid [Nitrofurantoin Macrocrystal]   . Sulfa Antibiotics   . Erythromycin Rash    Current Outpatient Prescriptions on File Prior to Visit  Medication Sig Dispense Refill  . albuterol (PROAIR HFA) 108 (90 BASE) MCG/ACT inhaler Inhale into the lungs every 6 (six) hours as needed for wheezing or shortness of breath.    Marland Kitchen amLODipine (NORVASC) 10 MG tablet Take 1 tablet (10 mg total) by mouth daily. 90 tablet 3  . amphetamine-dextroamphetamine (ADDERALL) 20 MG tablet Taking  1/2 to 1 tablet 4 times a day needed    . budesonide (PULMICORT) 180 MCG/ACT inhaler Inhale 1 puff into the lungs 2 (two) times daily. 1 Inhaler 5  . cyanocobalamin (,VITAMIN B-12,) 1000 MCG/ML injection Inject into the muscle.    . estradiol (ESTRACE) 1 MG tablet Take 1 mg by mouth daily.    Marland Kitchen levothyroxine (SYNTHROID, LEVOTHROID) 112 MCG tablet Take 1 tablet (112 mcg total) by mouth daily. 90 tablet 3  . magic mouthwash SOLN Take 15 mLs by mouth 3 (three) times daily as needed for mouth pain (Modified Magic Mouthwash). 360 mL 0  . olmesartan (BENICAR) 20 MG tablet TAKE 1 TABLET BY MOUTH TWICE A DAY 180 tablet 1  . pantoprazole (PROTONIX) 40 MG tablet TAKE 1 TABLET BY MOUTH TWICE  A DAY 60 tablet 5  . Zoster Vaccine Live, PF, (ZOSTAVAX) 91694 UNT/0.65ML injection Inject 19,400 Units into the skin once. (Patient not taking: Reported on 04/11/2016) 1 each 0   No current facility-administered medications on file prior to visit.     BP (!) 156/84   Pulse 86   Temp 97.7 F (36.5 C) (Oral)   Ht 5\' 3"  (1.6 m)   Wt 150 lb 1.9 oz (68.1 kg)   SpO2 98%   BMI 26.59 kg/m    Objective:   Physical Exam  Constitutional: She appears well-nourished.  Neck: Neck supple.  Cardiovascular: Normal rate and regular rhythm.   Pulmonary/Chest: Effort normal and breath sounds normal.  Musculoskeletal:  Moderate swelling to MCP joint of right hand to digits 2-4, left hand to digits 2-3. Decrease in ROM. No erythema.  Skin: Skin is warm and dry.          Assessment & Plan:

## 2016-09-25 DIAGNOSIS — I10 Essential (primary) hypertension: Secondary | ICD-10-CM | POA: Diagnosis not present

## 2016-09-25 DIAGNOSIS — M5023 Other cervical disc displacement, cervicothoracic region: Secondary | ICD-10-CM | POA: Diagnosis not present

## 2016-10-02 ENCOUNTER — Other Ambulatory Visit: Payer: Self-pay | Admitting: Primary Care

## 2016-10-02 DIAGNOSIS — K219 Gastro-esophageal reflux disease without esophagitis: Secondary | ICD-10-CM

## 2016-10-22 ENCOUNTER — Telehealth: Payer: Self-pay

## 2016-10-22 DIAGNOSIS — M069 Rheumatoid arthritis, unspecified: Secondary | ICD-10-CM

## 2016-10-22 MED ORDER — PREDNISONE 20 MG PO TABS: ORAL_TABLET | ORAL | 0 refills | Status: DC

## 2016-10-22 NOTE — Telephone Encounter (Signed)
Pt left v/m;pt seen 09/05/16; pt request stronger dose pk of prednisone and a referral to rheumatologist. Pt said the pain is very bad; pt having difficulty lifting herself out of the bed in the mornings due to pain. Pt request cb. [facility].

## 2016-10-22 NOTE — Telephone Encounter (Signed)
Spoken and notified patient of [name]'s comments. Patient verbalized understanding. 

## 2016-10-22 NOTE — Telephone Encounter (Signed)
Message left for patient to return my call.  

## 2016-10-22 NOTE — Telephone Encounter (Signed)
Noted, prednisone course sent to pharmacy. Please notify her that I also placed a referral for rheumatology.

## 2016-11-05 ENCOUNTER — Other Ambulatory Visit: Payer: Self-pay | Admitting: Primary Care

## 2016-11-05 DIAGNOSIS — M542 Cervicalgia: Secondary | ICD-10-CM | POA: Diagnosis not present

## 2016-11-05 DIAGNOSIS — M5023 Other cervical disc displacement, cervicothoracic region: Secondary | ICD-10-CM | POA: Diagnosis not present

## 2016-11-05 DIAGNOSIS — Z0000 Encounter for general adult medical examination without abnormal findings: Secondary | ICD-10-CM

## 2016-11-05 DIAGNOSIS — E039 Hypothyroidism, unspecified: Secondary | ICD-10-CM

## 2016-11-05 DIAGNOSIS — E538 Deficiency of other specified B group vitamins: Secondary | ICD-10-CM

## 2016-11-19 DIAGNOSIS — M542 Cervicalgia: Secondary | ICD-10-CM | POA: Diagnosis not present

## 2016-11-20 DIAGNOSIS — Z86010 Personal history of colonic polyps: Secondary | ICD-10-CM | POA: Diagnosis not present

## 2016-11-20 DIAGNOSIS — R1310 Dysphagia, unspecified: Secondary | ICD-10-CM | POA: Diagnosis not present

## 2016-11-20 DIAGNOSIS — K219 Gastro-esophageal reflux disease without esophagitis: Secondary | ICD-10-CM | POA: Diagnosis not present

## 2016-11-21 ENCOUNTER — Other Ambulatory Visit: Payer: Self-pay | Admitting: Primary Care

## 2016-11-21 MED ORDER — MAGIC MOUTHWASH: 15.0000 mL | Freq: Three times a day (TID) | ORAL | 0 refills | Status: DC | PRN

## 2016-11-21 NOTE — Telephone Encounter (Signed)
Received faxed refill request for magic mouthwash solution   Last prescribed on 02/23/2016. Last seen on 09/05/2016

## 2016-11-22 DIAGNOSIS — M542 Cervicalgia: Secondary | ICD-10-CM | POA: Diagnosis not present

## 2016-11-22 NOTE — Telephone Encounter (Signed)
Called in medication to the pharmacy as instructed. 

## 2016-11-23 ENCOUNTER — Other Ambulatory Visit (INDEPENDENT_AMBULATORY_CARE_PROVIDER_SITE_OTHER): Payer: 59

## 2016-11-23 DIAGNOSIS — Z0000 Encounter for general adult medical examination without abnormal findings: Secondary | ICD-10-CM | POA: Diagnosis not present

## 2016-11-23 DIAGNOSIS — E538 Deficiency of other specified B group vitamins: Secondary | ICD-10-CM

## 2016-11-23 DIAGNOSIS — E039 Hypothyroidism, unspecified: Secondary | ICD-10-CM

## 2016-11-23 LAB — TSH: TSH: 2.41 mIU/L

## 2016-11-24 LAB — LIPID PANEL
Cholesterol: 187 mg/dL (ref <200)
HDL: 72 mg/dL (ref >50)
LDL Cholesterol: 93 mg/dL (ref <100)
Total CHOL/HDL Ratio: 2.6 Ratio (ref <5.0)
Triglycerides: 109 mg/dL (ref <150)
VLDL: 22 mg/dL (ref <30)

## 2016-11-24 LAB — COMPREHENSIVE METABOLIC PANEL
ALT: 8 U/L (ref 6–29)
AST: 13 U/L (ref 10–35)
Albumin: 4 g/dL (ref 3.6–5.1)
Alkaline Phosphatase: 63 U/L (ref 33–130)
BUN: 14 mg/dL (ref 7–25)
CO2: 22 mmol/L (ref 20–31)
Calcium: 8.9 mg/dL (ref 8.6–10.4)
Chloride: 103 mmol/L (ref 98–110)
Creat: 1.04 mg/dL — ABNORMAL HIGH (ref 0.50–0.99)
Glucose, Bld: 117 mg/dL — ABNORMAL HIGH (ref 65–99)
Potassium: 4.1 mmol/L (ref 3.5–5.3)
Sodium: 138 mmol/L (ref 135–146)
Total Bilirubin: 0.3 mg/dL (ref 0.2–1.2)
Total Protein: 6.5 g/dL (ref 6.1–8.1)

## 2016-11-24 LAB — VITAMIN B12: Vitamin B-12: 413 pg/mL (ref 200–1100)

## 2016-11-26 ENCOUNTER — Other Ambulatory Visit: Payer: Self-pay | Admitting: Primary Care

## 2016-11-26 DIAGNOSIS — E039 Hypothyroidism, unspecified: Secondary | ICD-10-CM

## 2016-11-26 DIAGNOSIS — M542 Cervicalgia: Secondary | ICD-10-CM | POA: Diagnosis not present

## 2016-11-28 ENCOUNTER — Encounter: Payer: Self-pay | Admitting: Primary Care

## 2016-11-28 ENCOUNTER — Ambulatory Visit (INDEPENDENT_AMBULATORY_CARE_PROVIDER_SITE_OTHER): Payer: 59 | Admitting: Primary Care

## 2016-11-28 VITALS — BP 124/84 | HR 80 | Temp 98.2°F | Ht 63.0 in | Wt 143.0 lb

## 2016-11-28 DIAGNOSIS — E039 Hypothyroidism, unspecified: Secondary | ICD-10-CM | POA: Diagnosis not present

## 2016-11-28 DIAGNOSIS — E538 Deficiency of other specified B group vitamins: Secondary | ICD-10-CM | POA: Diagnosis not present

## 2016-11-28 DIAGNOSIS — Z0000 Encounter for general adult medical examination without abnormal findings: Secondary | ICD-10-CM

## 2016-11-28 DIAGNOSIS — M069 Rheumatoid arthritis, unspecified: Secondary | ICD-10-CM

## 2016-11-28 DIAGNOSIS — M5010 Cervical disc disorder with radiculopathy, unspecified cervical region: Secondary | ICD-10-CM | POA: Diagnosis not present

## 2016-11-28 DIAGNOSIS — I10 Essential (primary) hypertension: Secondary | ICD-10-CM | POA: Diagnosis not present

## 2016-11-28 DIAGNOSIS — K219 Gastro-esophageal reflux disease without esophagitis: Secondary | ICD-10-CM

## 2016-11-28 MED ORDER — LEVOTHYROXINE SODIUM 112 MCG PO TABS: 112.0000 ug | ORAL_TABLET | Freq: Every day | ORAL | 3 refills | Status: DC

## 2016-11-28 MED ORDER — OLMESARTAN MEDOXOMIL 20 MG PO TABS: 20.0000 mg | ORAL_TABLET | Freq: Two times a day (BID) | ORAL | 1 refills | Status: DC

## 2016-11-28 MED ORDER — CYANOCOBALAMIN 1000 MCG/ML IJ SOLN: INTRAMUSCULAR | 3 refills | Status: DC

## 2016-11-28 NOTE — Assessment & Plan Note (Signed)
Will undergo repeat endoscopy soon per GI. Continue PPI.

## 2016-11-28 NOTE — Assessment & Plan Note (Signed)
Stable in the office today, continue Benicar.

## 2016-11-28 NOTE — Assessment & Plan Note (Signed)
Recent TSH unremarkable, continue levothyroxine 112 mcg.

## 2016-11-28 NOTE — Assessment & Plan Note (Signed)
Overall stable. Continue pulmicort and PRN albuterol.

## 2016-11-28 NOTE — Progress Notes (Signed)
Subjective:    Patient ID: Katie Robbins, female    DOB: October 26, 1955, 61 y.o.   MRN: [id number]  HPI  Ms. Manzer is a 61 year old female who presents today for complete physical.   Immunizations: -Tetanus: October 2016 -Influenza: Did not receive last season   Diet: She endorsees a healthy diet. Breakfast: Eggs, protein, rice/noddles Lunch: Left overs, sandwich Dinner: Meatloaf, pasta, meat, vegetables, potato Snacks: None Desserts: Occasionally. Beverages: Water, grape juice, ginger-ale, little Coke   Exercise: She does not currently exercise. Is active at home. Eye exam: Completed in late 2017 Dental exam: Completes semi-annually Colonoscopy: Due, scheduled for next week. Following with GI through [facility]. Pap Smear: Hysterectomy  Mammogram: Completed in August 2017. Following with GYN, scheduled for August 2018.   Review of Systems  Constitutional: Negative for unexpected weight change.  HENT: Negative for rhinorrhea.   Respiratory: Negative for cough and shortness of breath.   Cardiovascular: Negative for chest pain.       Intermittent palpitations, does have cardiology.   Gastrointestinal: Negative for constipation and diarrhea.  Genitourinary: Negative for difficulty urinating and menstrual problem.  Musculoskeletal: Negative for arthralgias and myalgias.       Significant improvement in neck pain since surgery  Skin: Negative for rash.  Allergic/Immunologic: Negative for environmental allergies.  Neurological: Negative for dizziness, numbness and headaches.       Intermittently tingling to the base of her neck, improved overall  Psychiatric/Behavioral:       Feels well managed on current regimen for ADHD.       Past Medical History:  Diagnosis Date  . Arthritis   . Asthma   . Chicken pox   . Chronic headaches   . Diverticulitis   . Essential hypertension   . GERD (gastroesophageal reflux disease)   . H/O breast implant   .  Hypothyroidism   . Interstitial cystitis   . Mitral prolapse 1985  . Nutcracker esophagus      Social History   Social History  . Marital status: Married    Spouse name: N/A  . Number of children: N/A  . Years of education: N/A   Occupational History  . Not on file.   Social History Main Topics  . Smoking status: Never Smoker  . Smokeless tobacco: Never Used  . Alcohol use Yes  . Drug use: No  . Sexual activity: Not on file   Other Topics Concern  . Not on file   Social History Narrative  . No narrative on file    Past Surgical History:  Procedure Laterality Date  . ABDOMINAL HYSTERECTOMY  2000/2009  . APPENDECTOMY  2007  . CARDIAC CATHETERIZATION  2016   Clean  . CHOLECYSTECTOMY  2012  . CHOLECYSTECTOMY  2012  . SMALL INTESTINE SURGERY  2007   Bowel resection  . TONSILLECTOMY    . VESICO-VAGINAL FISTULA REPAIR  2007    Family History  Problem Relation Age of Onset  . Hypertension Mother   . GER disease Mother     Allergies  Allergen Reactions  . Nitrofurantoin Hives  . Sulfamethoxazole-Trimethoprim Hives  . Tizanidine Other (See Comments)    Bad Mood changs  . Vicodin [Hydrocodone-Acetaminophen] Swelling    Puffy eyes and face  . Bactrim [Sulfamethoxazole-Trimethoprim]   . Hydrocodone   . Macrobid [Nitrofurantoin Macrocrystal]   . Sulfa Antibiotics   . Erythromycin Rash    Current Outpatient Prescriptions on File Prior to Visit  Medication Sig Dispense  Refill  . albuterol (PROAIR HFA) 108 (90 BASE) MCG/ACT inhaler Inhale into the lungs every 6 (six) hours as needed for wheezing or shortness of breath.    Marland Kitchen amLODipine (NORVASC) 10 MG tablet Take 1 tablet (10 mg total) by mouth daily. 90 tablet 3  . amphetamine-dextroamphetamine (ADDERALL) 20 MG tablet Taking 1/2 to 1 tablet 4 times a day needed    . budesonide (PULMICORT) 180 MCG/ACT inhaler Inhale 1 puff into the lungs 2 (two) times daily. 1 Inhaler 5  . cyanocobalamin (,VITAMIN B-12,) 1000  MCG/ML injection Inject into the muscle.    . diazepam (VALIUM) 5 MG tablet Take 5 mg by mouth.    . estradiol (ESTRACE) 1 MG tablet Take 1 mg by mouth daily.    Marland Kitchen levothyroxine (SYNTHROID, LEVOTHROID) 112 MCG tablet TAKE 1 TABLET BY MOUTH DAILY 90 tablet 1  . magic mouthwash SOLN Take 15 mLs by mouth 3 (three) times daily as needed for mouth pain (Modified Magic Mouthwash). 360 mL 0  . olmesartan (BENICAR) 20 MG tablet TAKE 1 TABLET BY MOUTH TWICE A DAY 180 tablet 1  . pantoprazole (PROTONIX) 40 MG tablet TAKE 1 TABLET BY MOUTH TWICE A DAY 60 tablet 5   No current facility-administered medications on file prior to visit.     BP 124/84   Pulse 80   Temp 98.2 F (36.8 C) (Oral)   Ht 5\' 3"  (1.6 m)   Wt 143 lb (64.9 kg)   SpO2 98%   BMI 25.33 kg/m    Objective:   Physical Exam  Constitutional: She is oriented to person, place, and time. She appears well-nourished.  HENT:  Right Ear: Tympanic membrane and ear canal normal.  Left Ear: Tympanic membrane and ear canal normal.  Nose: Nose normal.  Mouth/Throat: Oropharynx is clear and moist.  Eyes: Pupils are equal, round, and reactive to light. Conjunctivae and EOM are normal.  Neck: Neck supple. No thyromegaly present.  Improved ROM to neck since surgery  Cardiovascular: Normal rate and regular rhythm.   No murmur heard. Pulmonary/Chest: Effort normal and breath sounds normal. She has no rales.  Abdominal: Soft. Bowel sounds are normal. There is no tenderness.  Musculoskeletal: Normal range of motion.  Lymphadenopathy:    She has no cervical adenopathy.  Neurological: She is alert and oriented to person, place, and time. She has normal reflexes. No cranial nerve deficit.  Skin: Skin is warm and dry. No rash noted.  Psychiatric: She has a normal mood and affect.          Assessment & Plan:

## 2016-11-28 NOTE — Assessment & Plan Note (Signed)
Referral to rheumatology made last visit, she is working to set up an appointment.

## 2016-11-28 NOTE — Assessment & Plan Note (Signed)
Immunizations UTD. Colonoscopy UTD. Mammogram due in August, she will schedule. Discussed the importance of a healthy diet and regular exercise in order for weight loss, and to reduce the risk of other medical problems. Exam unremarkable. Labs unremarkable. Follow up in 1 year for annual exam.

## 2016-11-28 NOTE — Assessment & Plan Note (Signed)
Significantly improved since surgery, no longer taking pain medications. Working with physical therapy.

## 2016-11-28 NOTE — Patient Instructions (Signed)
Continue your efforts towards a healthy diet and exercise.  Labs look good. Monitor your blood pressure if you reduce to 1 Benicar daily as discussed.  I sent refills to your pharmacy for B12, synthroid, and Benicar.  Follow up in 1 year for your annual exam or sooner if needed.  You look great! Nice to see you!

## 2016-11-29 DIAGNOSIS — M542 Cervicalgia: Secondary | ICD-10-CM | POA: Diagnosis not present

## 2016-12-05 DIAGNOSIS — Z01818 Encounter for other preprocedural examination: Secondary | ICD-10-CM | POA: Diagnosis not present

## 2016-12-05 DIAGNOSIS — Z8371 Family history of colonic polyps: Secondary | ICD-10-CM | POA: Diagnosis not present

## 2016-12-05 LAB — HM COLONOSCOPY

## 2016-12-10 DIAGNOSIS — M542 Cervicalgia: Secondary | ICD-10-CM | POA: Diagnosis not present

## 2016-12-13 ENCOUNTER — Telehealth: Payer: Self-pay

## 2016-12-13 DIAGNOSIS — M542 Cervicalgia: Secondary | ICD-10-CM | POA: Diagnosis not present

## 2016-12-13 DIAGNOSIS — M069 Rheumatoid arthritis, unspecified: Secondary | ICD-10-CM

## 2016-12-13 MED ORDER — PREDNISONE 20 MG PO TABS: ORAL_TABLET | ORAL | 0 refills | Status: DC

## 2016-12-13 NOTE — Telephone Encounter (Signed)
Patient states that she has given Rosaria Ferries some paperwork from x-rays in the past in [place] to fax in support of an appointment with ? Dr. Estanislado Pandy but she has not heard anything about an appointment yet.

## 2016-12-13 NOTE — Telephone Encounter (Signed)
Pt left v/m requesting refill prednisone pak (the strong one) for RA. In v/m pt said Allie Bossier NP has called in prednisone before. Pt in a lot of pain and request prednisone until can see RA doctor. Last annual exam on 11/28/16.Pt request cb. [place]

## 2016-12-13 NOTE — Telephone Encounter (Signed)
When is her appointment with the rheumatologist?  I'll send in one last refill, but she'll have to establish with rheumatology for management of symptoms as prednisone like this is not best practice.

## 2016-12-14 NOTE — Telephone Encounter (Signed)
It is Shirlean Mylar that is working with this patient to get her an appointment with Dr Estanislado Pandy. Their office wants the patients previous records for review before they will see the patient. Her records were just received and faxed to Dr Arlean Hopping office.

## 2016-12-14 NOTE — Telephone Encounter (Signed)
Left message asking pt to call office  °

## 2016-12-14 NOTE — Telephone Encounter (Signed)
Katie Robbins, can you help? Do you know anything about her appointment?

## 2016-12-17 DIAGNOSIS — M542 Cervicalgia: Secondary | ICD-10-CM | POA: Diagnosis not present

## 2016-12-19 DIAGNOSIS — M542 Cervicalgia: Secondary | ICD-10-CM | POA: Diagnosis not present

## 2016-12-24 NOTE — Telephone Encounter (Signed)
Faxed notes to April @ dr deveshwar office.

## 2016-12-27 DIAGNOSIS — R278 Other lack of coordination: Secondary | ICD-10-CM | POA: Diagnosis not present

## 2016-12-27 DIAGNOSIS — M62838 Other muscle spasm: Secondary | ICD-10-CM | POA: Diagnosis not present

## 2016-12-27 DIAGNOSIS — M542 Cervicalgia: Secondary | ICD-10-CM | POA: Diagnosis not present

## 2016-12-27 DIAGNOSIS — M6281 Muscle weakness (generalized): Secondary | ICD-10-CM | POA: Diagnosis not present

## 2016-12-31 ENCOUNTER — Telehealth: Payer: Self-pay | Admitting: Primary Care

## 2016-12-31 DIAGNOSIS — M542 Cervicalgia: Secondary | ICD-10-CM | POA: Diagnosis not present

## 2016-12-31 NOTE — Telephone Encounter (Signed)
Patient said she's having a hard time getting in with Dr.Ramicon. Patient was referred by our office to her for rheumatology. Patient is worried that they won't be able to get enough records from [place] and she's not sure if they're willing to take her on as a patient. Patient is asking for Vallarie Mare to call their office as a courtesy call  and try to speed up the process to see her for her rheumatoid arthritis. Patient said she's in a lot of pain when the prednisone wears off.  Patient said Vallarie Mare doesn't need to call her back unless Vallarie Mare wants to call her back.

## 2017-01-01 ENCOUNTER — Encounter: Payer: Self-pay | Admitting: Primary Care

## 2017-01-02 DIAGNOSIS — M62838 Other muscle spasm: Secondary | ICD-10-CM | POA: Diagnosis not present

## 2017-01-02 DIAGNOSIS — M6281 Muscle weakness (generalized): Secondary | ICD-10-CM | POA: Diagnosis not present

## 2017-01-02 DIAGNOSIS — R102 Pelvic and perineal pain: Secondary | ICD-10-CM | POA: Diagnosis not present

## 2017-01-02 NOTE — Telephone Encounter (Signed)
Katie Robbins, I can see that in the Referral she has been accepted as a patient but they have not scheduled her yet. I left them a voicemail and sent a message to please schedule her ASAP. I also called the patient to let her know that she has been accepted and they should be calling her soon.

## 2017-01-02 NOTE — Telephone Encounter (Signed)
Katie Robbins, can you help me with this?

## 2017-01-02 NOTE — Telephone Encounter (Signed)
Thank you, much appreciated.

## 2017-01-03 DIAGNOSIS — M542 Cervicalgia: Secondary | ICD-10-CM | POA: Diagnosis not present

## 2017-01-07 DIAGNOSIS — R300 Dysuria: Secondary | ICD-10-CM | POA: Diagnosis not present

## 2017-01-07 DIAGNOSIS — R102 Pelvic and perineal pain: Secondary | ICD-10-CM | POA: Diagnosis not present

## 2017-01-07 DIAGNOSIS — B9620 Unspecified Escherichia coli [E. coli] as the cause of diseases classified elsewhere: Secondary | ICD-10-CM | POA: Diagnosis not present

## 2017-01-07 DIAGNOSIS — M6281 Muscle weakness (generalized): Secondary | ICD-10-CM | POA: Diagnosis not present

## 2017-01-07 DIAGNOSIS — N390 Urinary tract infection, site not specified: Secondary | ICD-10-CM | POA: Diagnosis not present

## 2017-01-07 DIAGNOSIS — M62838 Other muscle spasm: Secondary | ICD-10-CM | POA: Diagnosis not present

## 2017-01-09 DIAGNOSIS — Z1231 Encounter for screening mammogram for malignant neoplasm of breast: Secondary | ICD-10-CM | POA: Diagnosis not present

## 2017-01-10 DIAGNOSIS — M542 Cervicalgia: Secondary | ICD-10-CM | POA: Diagnosis not present

## 2017-01-11 ENCOUNTER — Ambulatory Visit (INDEPENDENT_AMBULATORY_CARE_PROVIDER_SITE_OTHER): Payer: 59 | Admitting: Primary Care

## 2017-01-11 ENCOUNTER — Encounter: Payer: Self-pay | Admitting: Primary Care

## 2017-01-11 VITALS — BP 126/86 | HR 78 | Temp 98.2°F | Ht 63.0 in | Wt 138.4 lb

## 2017-01-11 DIAGNOSIS — R739 Hyperglycemia, unspecified: Secondary | ICD-10-CM

## 2017-01-11 DIAGNOSIS — M5010 Cervical disc disorder with radiculopathy, unspecified cervical region: Secondary | ICD-10-CM | POA: Diagnosis not present

## 2017-01-11 DIAGNOSIS — I10 Essential (primary) hypertension: Secondary | ICD-10-CM | POA: Diagnosis not present

## 2017-01-11 DIAGNOSIS — K219 Gastro-esophageal reflux disease without esophagitis: Secondary | ICD-10-CM

## 2017-01-11 DIAGNOSIS — E538 Deficiency of other specified B group vitamins: Secondary | ICD-10-CM

## 2017-01-11 DIAGNOSIS — J4540 Moderate persistent asthma, uncomplicated: Secondary | ICD-10-CM | POA: Diagnosis not present

## 2017-01-11 LAB — VITAMIN B12: Vitamin B-12: 405 pg/mL (ref 211–911)

## 2017-01-11 LAB — HEMOGLOBIN A1C: Hgb A1c MFr Bld: 5.7 % (ref 4.6–6.5)

## 2017-01-11 MED ORDER — BUDESONIDE 180 MCG/ACT IN AEPB: 2.0000 | INHALATION_SPRAY | Freq: Two times a day (BID) | RESPIRATORY_TRACT | 5 refills | Status: DC

## 2017-01-11 NOTE — Progress Notes (Signed)
Subjective:    Patient ID: Katie Robbins, female    DOB: 1955/10/06, 61 y.o.   MRN: [id number]  HPI  Ms. Coghill is a 61 year old female with a history of rheumatoid arthritis, hypothyroidism, asthma, hypertension who presents today with multiple complaints.  1) Shortness of Breath: Also with chest pain, swelling and pain to her hands/fingers. Her chest pain and shortness of breath began three weeks ago. She will experience shortness of breath with walking upstairs, cooking dinner. Her chest pain is located to the left chest that is intermittent with rest and exertion. She is following with cardiology and has not called to notify them of her symptoms. She denies esophageal burning, abdominal pain, radiation of pain.   History of cardiac catheterization in February 2016 that was clear. She is on Adderall 20 mg for which she's been taking 1 tablet twice daily. She did find out that her daughter is pregnant with twins three weeks ago and she's afraid that she won't be physically able to help as she's very slow moving. She thinks most of her symptoms are from anxiety for that reason.   She's been compliant to her Pulmicort 180 mcg twice daily and is using the albuterol inhaler three times daily, every day. She thinks she's noticed wheezing. He's tried Advair in the past which made her "feel bad".   Wt Readings from Last 3 Encounters:  01/11/17 138 lb 6.4 oz (62.8 kg)  11/28/16 143 lb (64.9 kg)  09/05/16 150 lb 1.9 oz (68.1 kg)     Review of Systems  Constitutional: Negative for fatigue and fever.  HENT: Negative for congestion.   Respiratory: Positive for shortness of breath and wheezing. Negative for cough.   Cardiovascular: Positive for chest pain.  Gastrointestinal: Negative for abdominal pain.       Denies esophageal reflux symptoms  Musculoskeletal: Positive for arthralgias.  Skin: Negative for color change.  Psychiatric/Behavioral: The patient is nervous/anxious.        Past  Medical History:  Diagnosis Date  . Arthritis   . Asthma   . Chicken pox   . Chronic headaches   . Diverticulitis   . Essential hypertension   . GERD (gastroesophageal reflux disease)   . H/O breast implant   . Hypothyroidism   . Interstitial cystitis   . Mitral prolapse 1985  . Nutcracker esophagus      Social History   Social History  . Marital status: Married    Spouse name: N/A  . Number of children: N/A  . Years of education: N/A   Occupational History  . Not on file.   Social History Main Topics  . Smoking status: Never Smoker  . Smokeless tobacco: Never Used  . Alcohol use Yes  . Drug use: No  . Sexual activity: Not on file   Other Topics Concern  . Not on file   Social History Narrative  . No narrative on file    Past Surgical History:  Procedure Laterality Date  . ABDOMINAL HYSTERECTOMY  2000/2009  . APPENDECTOMY  2007  . CARDIAC CATHETERIZATION  2016   Clean  . CHOLECYSTECTOMY  2012  . CHOLECYSTECTOMY  2012  . SMALL INTESTINE SURGERY  2007   Bowel resection  . TONSILLECTOMY    . VESICO-VAGINAL FISTULA REPAIR  2007    Family History  Problem Relation Age of Onset  . Hypertension Mother   . GER disease Mother     Allergies  Allergen Reactions  . Nitrofurantoin  Hives  . Sulfamethoxazole-Trimethoprim Hives  . Tizanidine Other (See Comments)    Bad Mood changs  . Vicodin [Hydrocodone-Acetaminophen] Swelling    Puffy eyes and face  . Bactrim [Sulfamethoxazole-Trimethoprim]   . Hydrocodone   . Macrobid [Nitrofurantoin Macrocrystal]   . Sulfa Antibiotics   . Erythromycin Rash    Current Outpatient Prescriptions on File Prior to Visit  Medication Sig Dispense Refill  . albuterol (PROAIR HFA) 108 (90 BASE) MCG/ACT inhaler Inhale into the lungs every 6 (six) hours as needed for wheezing or shortness of breath.    Marland Kitchen amLODipine (NORVASC) 10 MG tablet Take 1 tablet (10 mg total) by mouth daily. 90 tablet 3  . amphetamine-dextroamphetamine  (ADDERALL) 20 MG tablet Taking 1/2 to 1 tablet 4 times a day needed    . cyanocobalamin (,VITAMIN B-12,) 1000 MCG/ML injection Inject 1 ml into the muscle every 3-4 weeks. 14 mL 3  . diazepam (VALIUM) 5 MG tablet Take 5 mg by mouth.    . estradiol (ESTRACE) 1 MG tablet Take 1 mg by mouth daily.    Marland Kitchen levothyroxine (SYNTHROID, LEVOTHROID) 112 MCG tablet Take 1 tablet (112 mcg total) by mouth daily. 90 tablet 3  . magic mouthwash SOLN Take 15 mLs by mouth 3 (three) times daily as needed for mouth pain (Modified Magic Mouthwash). 360 mL 0  . olmesartan (BENICAR) 20 MG tablet Take 1 tablet (20 mg total) by mouth 2 (two) times daily. 180 tablet 1  . pantoprazole (PROTONIX) 40 MG tablet TAKE 1 TABLET BY MOUTH TWICE A DAY 60 tablet 5   No current facility-administered medications on file prior to visit.     BP 126/86   Pulse 78   Temp 98.2 F (36.8 C) (Oral)   Ht 5\' 3"  (1.6 m)   Wt 138 lb 6.4 oz (62.8 kg)   SpO2 98%   BMI 24.52 kg/m    Objective:   Physical Exam  Constitutional: She is oriented to person, place, and time. She appears well-nourished.  Neck: Neck supple.  Cardiovascular: Normal rate and regular rhythm.   Pulmonary/Chest: Effort normal and breath sounds normal.  Neurological: She is alert and oriented to person, place, and time.  Skin: Skin is warm and dry.  Psychiatric: She has a normal mood and affect.          Assessment & Plan:  Shortness of breath and chest pain:  Normal cardiac catheterization in 2016. Offered EKG today, she declines. She would like to speak with her cardiologist first as she has a history of abnormal EKG's and feels like her chest pain is secondary to anxiety. Recommended she discuss Adderall use with her cardiologist. Check A1c given history of hyperglycemia on recent labs. Check vitamin B12 given significant drop from the past 1 year. We will increase Pulmicort given shortness of breath and history of asthma. She will update if no  improvement. Recommended she discuss anxiety with her psychiatrist.  Sheral Flow, NP

## 2017-01-11 NOTE — Patient Instructions (Signed)
Start taking 2 puffs of your Pulmicort twice daily. Please update me if no improvement in 2 weeks.   Use the albuterol inhaler only as needed for shortness of breath.  Call to schedule an appointment with your cardiologist.   Complete lab work prior to leaving today. I will notify you of your results once received.   It was a pleasure to see you today!

## 2017-01-11 NOTE — Assessment & Plan Note (Signed)
Denies symptoms, managed on pantoprazole.

## 2017-01-11 NOTE — Assessment & Plan Note (Signed)
Compliant to Pulmicort daily, frequent use of albuterol. Symptoms of shortness of breath could be secondary to uncontrolled asthma. Exam today without wheezing.   Will increase Pulmicort to 2 puffs twice a day. Discussed to use albuterol sparingly, only as needed. She will update in 2 weeks if no improvement in shortness of breath. She cannot tolerate Advair.

## 2017-01-11 NOTE — Assessment & Plan Note (Signed)
Continued improvement in pain, seeing physical therapy.

## 2017-01-11 NOTE — Assessment & Plan Note (Signed)
Stable in the office today, continue current regimen. 

## 2017-02-21 DIAGNOSIS — M542 Cervicalgia: Secondary | ICD-10-CM | POA: Diagnosis not present

## 2017-02-25 DIAGNOSIS — M542 Cervicalgia: Secondary | ICD-10-CM | POA: Diagnosis not present

## 2017-02-28 DIAGNOSIS — M542 Cervicalgia: Secondary | ICD-10-CM | POA: Diagnosis not present

## 2017-03-06 ENCOUNTER — Ambulatory Visit: Payer: Self-pay | Admitting: *Deleted

## 2017-03-06 ENCOUNTER — Telehealth: Payer: Self-pay | Admitting: *Deleted

## 2017-03-06 ENCOUNTER — Encounter: Payer: Self-pay | Admitting: *Deleted

## 2017-03-06 DIAGNOSIS — I10 Essential (primary) hypertension: Secondary | ICD-10-CM

## 2017-03-06 MED ORDER — OLMESARTAN MEDOXOMIL 20 MG PO TABS: 20.0000 mg | ORAL_TABLET | Freq: Two times a day (BID) | ORAL | 1 refills | Status: DC

## 2017-03-06 NOTE — Telephone Encounter (Signed)
This encounter was created in error - please disregard.

## 2017-03-06 NOTE — Addendum Note (Signed)
Addended by: Jacqualin Combes on: 03/06/2017 05:24 PM   Modules accepted: Orders

## 2017-03-06 NOTE — Telephone Encounter (Signed)
Per Anda Kraft, okay to send Benicar for patient since she is in [place].

## 2017-03-06 NOTE — Telephone Encounter (Signed)
Confirm with [facility] that Rx is ready for pick up for patient.

## 2017-03-06 NOTE — Telephone Encounter (Signed)
Message routed to [facility] regarding medication refill

## 2017-04-02 ENCOUNTER — Other Ambulatory Visit: Payer: Self-pay | Admitting: Primary Care

## 2017-04-02 NOTE — Telephone Encounter (Signed)
Received faxed refill request for albuterol (PROAIR HFA) 108 (90 BASE) MCG/ACT inhaler  Medication have not been prescribed by Anda Kraft. Last seen on 01/11/2017

## 2017-04-06 NOTE — Telephone Encounter (Signed)
How's she doing since we increased her pulmicort dose to 2 puffs BID? How often is she using the albuterol inhaler?

## 2017-04-08 ENCOUNTER — Other Ambulatory Visit: Payer: Self-pay | Admitting: Primary Care

## 2017-04-08 NOTE — Telephone Encounter (Signed)
I believe she's following with GYN, please send refill request to GYN. History of hysterectomy, not sure if she's using this for vasomotor symptoms from menopause.

## 2017-04-08 NOTE — Telephone Encounter (Signed)
Ok to refill? Electronically refill request for medroxyPROGESTERone (DEPO-PROVERA) 150 MG/ML injection   Medication have not been prescribed by Anda Kraft. Last seen on 01/11/2017

## 2017-04-09 DIAGNOSIS — M542 Cervicalgia: Secondary | ICD-10-CM | POA: Diagnosis not present

## 2017-04-09 DIAGNOSIS — R030 Elevated blood-pressure reading, without diagnosis of hypertension: Secondary | ICD-10-CM | POA: Diagnosis not present

## 2017-04-10 DIAGNOSIS — M542 Cervicalgia: Secondary | ICD-10-CM | POA: Diagnosis not present

## 2017-04-12 NOTE — Telephone Encounter (Signed)
Message left for patient to return my call on 04/08/2017 and 04/12/2017

## 2017-04-15 DIAGNOSIS — M542 Cervicalgia: Secondary | ICD-10-CM | POA: Diagnosis not present

## 2017-04-16 DIAGNOSIS — Z01419 Encounter for gynecological examination (general) (routine) without abnormal findings: Secondary | ICD-10-CM | POA: Diagnosis not present

## 2017-04-16 NOTE — Telephone Encounter (Signed)
Message left for patient to return my call.  

## 2017-04-17 ENCOUNTER — Encounter: Payer: Self-pay | Admitting: Primary Care

## 2017-04-17 ENCOUNTER — Ambulatory Visit: Payer: 59 | Admitting: Primary Care

## 2017-04-17 VITALS — BP 144/78 | HR 99 | Temp 98.2°F | Wt 140.8 lb

## 2017-04-17 DIAGNOSIS — M5010 Cervical disc disorder with radiculopathy, unspecified cervical region: Secondary | ICD-10-CM

## 2017-04-17 DIAGNOSIS — J4540 Moderate persistent asthma, uncomplicated: Secondary | ICD-10-CM | POA: Diagnosis not present

## 2017-04-17 DIAGNOSIS — E039 Hypothyroidism, unspecified: Secondary | ICD-10-CM

## 2017-04-17 DIAGNOSIS — I10 Essential (primary) hypertension: Secondary | ICD-10-CM

## 2017-04-17 DIAGNOSIS — M069 Rheumatoid arthritis, unspecified: Secondary | ICD-10-CM | POA: Diagnosis not present

## 2017-04-17 LAB — COMPREHENSIVE METABOLIC PANEL
ALT: 8 U/L (ref 0–35)
AST: 16 U/L (ref 0–37)
Albumin: 4.4 g/dL (ref 3.5–5.2)
Alkaline Phosphatase: 66 U/L (ref 39–117)
BUN: 13 mg/dL (ref 6–23)
CO2: 28 mEq/L (ref 19–32)
Calcium: 8.9 mg/dL (ref 8.4–10.5)
Chloride: 103 mEq/L (ref 96–112)
Creatinine, Ser: 1.16 mg/dL (ref 0.40–1.20)
GFR: 50.39 mL/min — ABNORMAL LOW (ref >60.00)
Glucose, Bld: 123 mg/dL — ABNORMAL HIGH (ref 70–99)
Potassium: 3.9 mEq/L (ref 3.5–5.1)
Sodium: 139 mEq/L (ref 135–145)
Total Bilirubin: 0.4 mg/dL (ref 0.2–1.2)
Total Protein: 7.2 g/dL (ref 6.0–8.3)

## 2017-04-17 LAB — TSH: TSH: 1.35 u[IU]/mL (ref 0.35–4.50)

## 2017-04-17 MED ORDER — ALBUTEROL SULFATE HFA 108 (90 BASE) MCG/ACT IN AERS: 2.0000 | INHALATION_SPRAY | Freq: Four times a day (QID) | RESPIRATORY_TRACT | 5 refills | Status: DC | PRN

## 2017-04-17 NOTE — Assessment & Plan Note (Signed)
Repeat TSH pending

## 2017-04-17 NOTE — Progress Notes (Signed)
Subjective:    Patient ID: Katie Robbins, female    DOB: June 22, 1955, 61 y.o.   MRN: [id number]  HPI  Katie Robbins is a 61 year old female who presents today for medication refill.  1) Asthma: Currently managed on Pulmicort, 2 puffs BID, and albuterol PRN. She's compliant to her Pulmicort twice daily, using albuterol inhaler several times weekly, sometimes once daily.   2) Hypothyroidism: Currently managed on levothyroxine 112 mcg. TSH in July 2018 stable. She has noticed decrease in overall hair loss, and increased strength to her nails. She would like her TSH rechecked today.   3) Essential Hypertension: Currently managed on olmestartan 20 mg and Amlodipine 10 mg. She denies chest pain, dizziness.   BP Readings from Last 3 Encounters:  04/17/17 (!) 144/78  01/11/17 126/86  11/28/16 124/84     Review of Systems  Eyes: Negative for visual disturbance.  Respiratory: Negative for shortness of breath.   Cardiovascular: Negative for chest pain.  Musculoskeletal:       Following with PT for neck rehabilitation   Neurological: Negative for dizziness and headaches.       Past Medical History:  Diagnosis Date  . Arthritis   . Asthma   . Chicken pox   . Chronic headaches   . Diverticulitis   . Essential hypertension   . GERD (gastroesophageal reflux disease)   . H/O breast implant   . Hypothyroidism   . Interstitial cystitis   . Mitral prolapse 1985  . Nutcracker esophagus      Social History   Socioeconomic History  . Marital status: Married    Spouse name: Not on file  . Number of children: Not on file  . Years of education: Not on file  . Highest education level: Not on file  Social Needs  . Financial resource strain: Not on file  . Food insecurity - worry: Not on file  . Food insecurity - inability: Not on file  . Transportation needs - medical: Not on file  . Transportation needs - non-medical: Not on file  Occupational History  . Not on file  Tobacco Use    . Smoking status: Never Smoker  . Smokeless tobacco: Never Used  Substance and Sexual Activity  . Alcohol use: Yes  . Drug use: No  . Sexual activity: Not on file  Other Topics Concern  . Not on file  Social History Narrative  . Not on file    Past Surgical History:  Procedure Laterality Date  . ABDOMINAL HYSTERECTOMY  2000/2009  . APPENDECTOMY  2007  . CARDIAC CATHETERIZATION  2016   Clean  . CHOLECYSTECTOMY  2012  . CHOLECYSTECTOMY  2012  . SMALL INTESTINE SURGERY  2007   Bowel resection  . TONSILLECTOMY    . VESICO-VAGINAL FISTULA REPAIR  2007    Family History  Problem Relation Age of Onset  . Hypertension Mother   . GER disease Mother     Allergies  Allergen Reactions  . Nitrofurantoin Hives  . Sulfamethoxazole-Trimethoprim Hives  . Tizanidine Other (See Comments)    Bad Mood changs  . Vicodin [Hydrocodone-Acetaminophen] Swelling    Puffy eyes and face  . Bactrim [Sulfamethoxazole-Trimethoprim]   . Hydrocodone   . Macrobid [Nitrofurantoin Macrocrystal]   . Sulfa Antibiotics   . Erythromycin Rash    Current Outpatient Medications on File Prior to Visit  Medication Sig Dispense Refill  . amLODipine (NORVASC) 10 MG tablet Take 1 tablet (10 mg total) by mouth daily.  90 tablet 3  . amphetamine-dextroamphetamine (ADDERALL) 20 MG tablet Taking 1/2 to 1 tablet 4 times a day needed    . BD PEN NEEDLE NANO U/F 32G X 4 MM MISC USE ONE  4 TIMES DAILY WITH  INSULIN  PEN 300 each 1  . budesonide (PULMICORT) 180 MCG/ACT inhaler Inhale 2 puffs into the lungs 2 (two) times daily. 1 Inhaler 5  . cyanocobalamin (,VITAMIN B-12,) 1000 MCG/ML injection Inject 1 ml into the muscle every 3-4 weeks. 14 mL 3  . diazepam (VALIUM) 5 MG tablet Take 5 mg by mouth.    . estradiol (ESTRACE) 1 MG tablet Take 1 mg by mouth daily.    Marland Kitchen levothyroxine (SYNTHROID, LEVOTHROID) 112 MCG tablet Take 1 tablet (112 mcg total) by mouth daily. 90 tablet 3  . magic mouthwash SOLN Take 15 mLs by  mouth 3 (three) times daily as needed for mouth pain (Modified Magic Mouthwash). 360 mL 0  . olmesartan (BENICAR) 20 MG tablet Take 1 tablet (20 mg total) by mouth 2 (two) times daily. 60 tablet 1  . pantoprazole (PROTONIX) 40 MG tablet TAKE 1 TABLET BY MOUTH TWICE A DAY 60 tablet 5   No current facility-administered medications on file prior to visit.     BP (!) 144/78 (BP Location: Left Arm, Patient Position: Sitting, Cuff Size: Normal)   Pulse 99   Temp 98.2 F (36.8 C) (Oral)   Wt 140 lb 12 oz (63.8 kg)   SpO2 99%   BMI 24.93 kg/m    Objective:   Physical Exam  Constitutional: She appears well-nourished.  Neck: Neck supple.  Cardiovascular: Normal rate and regular rhythm.  Pulmonary/Chest: Effort normal and breath sounds normal. She has no wheezes.  Skin: Skin is warm and dry.          Assessment & Plan:

## 2017-04-17 NOTE — Assessment & Plan Note (Signed)
Lungs clear today. Overall reduced use of albuterol, using appropriately, refills sent to pharmacy.

## 2017-04-17 NOTE — Assessment & Plan Note (Addendum)
Released from neurosurgery, now following with physical therapy. Doing well. Will be seeing local neurologist.

## 2017-04-17 NOTE — Patient Instructions (Signed)
Complete lab work prior to leaving today. I will notify you of your results once received.   I sent refills for your albuterol to the pharmacy.   Have the pharmacy notify me when you are due for your pulmicort.  We will see you this summer for your physical! It was a pleasure to see you today!

## 2017-04-17 NOTE — Assessment & Plan Note (Signed)
Slightly above goal, normal readings on prior visits and at home. Continue current regimen.

## 2017-04-17 NOTE — Assessment & Plan Note (Signed)
Appointment scheduled in January 2019.

## 2017-04-18 ENCOUNTER — Encounter: Payer: Self-pay | Admitting: Primary Care

## 2017-04-18 DIAGNOSIS — M542 Cervicalgia: Secondary | ICD-10-CM | POA: Diagnosis not present
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
## 2017-05-29 ENCOUNTER — Ambulatory Visit: Payer: 59 | Admitting: Rheumatology

## 2017-05-29 ENCOUNTER — Ambulatory Visit (INDEPENDENT_AMBULATORY_CARE_PROVIDER_SITE_OTHER): Payer: Self-pay

## 2017-05-29 ENCOUNTER — Encounter: Payer: Self-pay | Admitting: Rheumatology

## 2017-05-29 VITALS — BP 122/68 | HR 76 | Resp 14 | Ht 63.0 in | Wt 135.0 lb

## 2017-05-29 DIAGNOSIS — M79641 Pain in right hand: Secondary | ICD-10-CM | POA: Diagnosis not present

## 2017-05-29 DIAGNOSIS — M5030 Other cervical disc degeneration, unspecified cervical region: Secondary | ICD-10-CM

## 2017-05-29 DIAGNOSIS — R5383 Other fatigue: Secondary | ICD-10-CM

## 2017-05-29 DIAGNOSIS — I10 Essential (primary) hypertension: Secondary | ICD-10-CM

## 2017-05-29 DIAGNOSIS — M25562 Pain in left knee: Secondary | ICD-10-CM | POA: Diagnosis not present

## 2017-05-29 DIAGNOSIS — Z8639 Personal history of other endocrine, nutritional and metabolic disease: Secondary | ICD-10-CM | POA: Diagnosis not present

## 2017-05-29 DIAGNOSIS — Z8679 Personal history of other diseases of the circulatory system: Secondary | ICD-10-CM

## 2017-05-29 DIAGNOSIS — M25552 Pain in left hip: Secondary | ICD-10-CM

## 2017-05-29 DIAGNOSIS — Z8709 Personal history of other diseases of the respiratory system: Secondary | ICD-10-CM

## 2017-05-29 DIAGNOSIS — N3010 Interstitial cystitis (chronic) without hematuria: Secondary | ICD-10-CM

## 2017-05-29 DIAGNOSIS — Z9889 Other specified postprocedural states: Secondary | ICD-10-CM

## 2017-05-29 DIAGNOSIS — M19041 Primary osteoarthritis, right hand: Secondary | ICD-10-CM | POA: Diagnosis not present

## 2017-05-29 DIAGNOSIS — Z8739 Personal history of other diseases of the musculoskeletal system and connective tissue: Secondary | ICD-10-CM

## 2017-05-29 DIAGNOSIS — M79642 Pain in left hand: Secondary | ICD-10-CM

## 2017-05-29 DIAGNOSIS — Z9882 Breast implant status: Secondary | ICD-10-CM

## 2017-05-29 DIAGNOSIS — Z8719 Personal history of other diseases of the digestive system: Secondary | ICD-10-CM | POA: Diagnosis not present

## 2017-05-29 DIAGNOSIS — M19042 Primary osteoarthritis, left hand: Secondary | ICD-10-CM

## 2017-05-29 DIAGNOSIS — G8929 Other chronic pain: Secondary | ICD-10-CM

## 2017-05-29 DIAGNOSIS — K219 Gastro-esophageal reflux disease without esophagitis: Secondary | ICD-10-CM

## 2017-05-29 DIAGNOSIS — M1712 Unilateral primary osteoarthritis, left knee: Secondary | ICD-10-CM

## 2017-05-29 NOTE — Patient Instructions (Signed)
Knee Exercises Ask your health care provider which exercises are safe for you. Do exercises exactly as told by your health care provider and adjust them as directed. It is normal to feel mild stretching, pulling, tightness, or discomfort as you do these exercises, but you should stop right away if you feel sudden pain or your pain gets worse.Do not begin these exercises until told by your health care provider. STRETCHING AND RANGE OF MOTION EXERCISES These exercises warm up your muscles and joints and improve the movement and flexibility of your knee. These exercises also help to relieve pain, numbness, and tingling. Exercise A: Knee Extension, Prone 1. Lie on your abdomen on a bed. 2. Place your left / right knee just beyond the edge of the surface so your knee is not on the bed. You can put a towel under your left / right thigh just above your knee for comfort. 3. Relax your leg muscles and allow gravity to straighten your knee. You should feel a stretch behind your left / right knee. 4. Hold this position for __________ seconds. 5. Scoot up so your knee is supported between repetitions. Repeat __________ times. Complete this stretch __________ times a day. Exercise B: Knee Flexion, Active  1. Lie on your back with both knees straight. If this causes back discomfort, bend your left / right knee so your foot is flat on the floor. 2. Slowly slide your left / right heel back toward your buttocks until you feel a gentle stretch in the front of your knee or thigh. 3. Hold this position for __________ seconds. 4. Slowly slide your left / right heel back to the starting position. Repeat __________ times. Complete this exercise __________ times a day. Exercise C: Quadriceps, Prone  1. Lie on your abdomen on a firm surface, such as a bed or padded floor. 2. Bend your left / right knee and hold your ankle. If you cannot reach your ankle or pant leg, loop a belt around your foot and grab the belt  instead. 3. Gently pull your heel toward your buttocks. Your knee should not slide out to the side. You should feel a stretch in the front of your thigh and knee. 4. Hold this position for __________ seconds. Repeat __________ times. Complete this stretch __________ times a day. Exercise D: Hamstring, Supine 1. Lie on your back. 2. Loop a belt or towel over the ball of your left / right foot. The ball of your foot is on the walking surface, right under your toes. 3. Straighten your left / right knee and slowly pull on the belt to raise your leg until you feel a gentle stretch behind your knee. ? Do not let your left / right knee bend while you do this. ? Keep your other leg flat on the floor. 4. Hold this position for __________ seconds. Repeat __________ times. Complete this stretch __________ times a day. STRENGTHENING EXERCISES These exercises build strength and endurance in your knee. Endurance is the ability to use your muscles for a long time, even after they get tired. Exercise E: Quadriceps, Isometric  1. Lie on your back with your left / right leg extended and your other knee bent. Put a rolled towel or small pillow under your knee if told by your health care provider. 2. Slowly tense the muscles in the front of your left / right thigh. You should see your kneecap slide up toward your hip or see increased dimpling just above the knee. This   motion will push the back of the knee toward the floor. 3. For __________ seconds, keep the muscle as tight as you can without increasing your pain. 4. Relax the muscles slowly and completely. Repeat __________ times. Complete this exercise __________ times a day. Exercise F: Straight Leg Raises - Quadriceps 1. Lie on your back with your left / right leg extended and your other knee bent. 2. Tense the muscles in the front of your left / right thigh. You should see your kneecap slide up or see increased dimpling just above the knee. Your thigh may  even shake a bit. 3. Keep these muscles tight as you raise your leg 4-6 inches (10-15 cm) off the floor. Do not let your knee bend. 4. Hold this position for __________ seconds. 5. Keep these muscles tense as you lower your leg. 6. Relax your muscles slowly and completely after each repetition. Repeat __________ times. Complete this exercise __________ times a day. Exercise G: Hamstring, Isometric 1. Lie on your back on a firm surface. 2. Bend your left / right knee approximately __________ degrees. 3. Dig your left / right heel into the surface as if you are trying to pull it toward your buttocks. Tighten the muscles in the back of your thighs to dig as hard as you can without increasing any pain. 4. Hold this position for __________ seconds. 5. Release the tension gradually and allow your muscles to relax completely for __________ seconds after each repetition. Repeat __________ times. Complete this exercise __________ times a day. Exercise H: Hamstring Curls  If told by your health care provider, do this exercise while wearing ankle weights. Begin with __________ weights. Then increase the weight by 1 lb (0.5 kg) increments. Do not wear ankle weights that are more than __________. 1. Lie on your abdomen with your legs straight. 2. Bend your left / right knee as far as you can without feeling pain. Keep your hips flat against the floor. 3. Hold this position for __________ seconds. 4. Slowly lower your leg to the starting position.  Repeat __________ times. Complete this exercise __________ times a day. Exercise I: Squats (Quadriceps) 1. Stand in front of a table, with your feet and knees pointing straight ahead. You may rest your hands on the table for balance but not for support. 2. Slowly bend your knees and lower your hips like you are going to sit in a chair. ? Keep your weight over your heels, not over your toes. ? Keep your lower legs upright so they are parallel with the table  legs. ? Do not let your hips go lower than your knees. ? Do not bend lower than told by your health care provider. ? If your knee pain increases, do not bend as low. 3. Hold the squat position for __________ seconds. 4. Slowly push with your legs to return to standing. Do not use your hands to pull yourself to standing. Repeat __________ times. Complete this exercise __________ times a day. Exercise J: Wall Slides (Quadriceps)  1. Lean your back against a smooth wall or door while you walk your feet out 18-24 inches (46-61 cm) from it. 2. Place your feet hip-width apart. 3. Slowly slide down the wall or door until your knees bend __________ degrees. Keep your knees over your heels, not over your toes. Keep your knees in line with your hips. 4. Hold for __________ seconds. Repeat __________ times. Complete this exercise __________ times a day. Exercise K: Straight Leg Raises -   Hip Abductors 1. Lie on your side with your left / right leg in the top position. Lie so your head, shoulder, knee, and hip line up. You may bend your bottom knee to help you keep your balance. 2. Roll your hips slightly forward so your hips are stacked directly over each other and your left / right knee is facing forward. 3. Leading with your heel, lift your top leg 4-6 inches (10-15 cm). You should feel the muscles in your outer hip lifting. ? Do not let your foot drift forward. ? Do not let your knee roll toward the ceiling. 4. Hold this position for __________ seconds. 5. Slowly return your leg to the starting position. 6. Let your muscles relax completely after each repetition. Repeat __________ times. Complete this exercise __________ times a day. Exercise L: Straight Leg Raises - Hip Extensors 1. Lie on your abdomen on a firm surface. You can put a pillow under your hips if that is more comfortable. 2. Tense the muscles in your buttocks and lift your left / right leg about 4-6 inches (10-15 cm). Keep your knee  straight as you lift your leg. 3. Hold this position for __________ seconds. 4. Slowly lower your leg to the starting position. 5. Let your leg relax completely after each repetition. Repeat __________ times. Complete this exercise __________ times a day. This information is not intended to replace advice given to you by your health care provider. Make sure you discuss any questions you have with your health care provider. Document Released: 03/14/2005 Document Revised: 01/23/2016 Document Reviewed: 03/06/2015 Elsevier Interactive Patient Education  2018 [employer] Hand Exercises Hand exercises can be helpful to almost anyone. These exercises can strengthen the hands, improve flexibility and movement, and increase blood flow to the hands. These results can make work and daily tasks easier. Hand exercises can be especially helpful for people who have joint pain from arthritis or have nerve damage from overuse (carpal tunnel syndrome). These exercises can also help people who have injured a hand. Most of these hand exercises are fairly gentle stretching routines. You can do them often throughout the day. Still, it is a good idea to ask your health care provider which exercises would be best for you. Warming your hands before exercise may help to reduce stiffness. You can do this with gentle massage or by placing your hands in warm water for 15 minutes. Also, make sure you pay attention to your level of hand pain as you begin an exercise routine. Exercises Knuckle Bend Repeat this exercise 5-10 times with each hand. 1. Stand or sit with your arm, hand, and all five fingers pointed straight up. Make sure your wrist is straight. 2. Gently and slowly bend your fingers down and inward until the tips of your fingers are touching the tops of your palm. 3. Hold this position for a few seconds. 4. Extend your fingers out to their original position, all pointing straight up again.  Finger Fan Repeat this  exercise 5-10 times with each hand. 1. Hold your arm and hand out in front of you. Keep your wrist straight. 2. Squeeze your hand into a fist. 3. Hold this position for a few seconds. 4. Fan out, or spread apart, your hand and fingers as much as possible, stretching every joint fully.  Tabletop Repeat this exercise 5-10 times with each hand. 1. Stand or sit with your arm, hand, and all five fingers pointed straight up. Make sure your wrist is straight.   2. Gently and slowly bend your fingers at the knuckles where they meet the hand until your hand is making an upside-down L shape. Your fingers should form a tabletop. 3. Hold this position for a few seconds. 4. Extend your fingers out to their original position, all pointing straight up again.  Making Os Repeat this exercise 5-10 times with each hand. 1. Stand or sit with your arm, hand, and all five fingers pointed straight up. Make sure your wrist is straight. 2. Make an O shape by touching your pointer finger to your thumb. Hold for a few seconds. Then open your hand wide. 3. Repeat this motion with each finger on your hand.  Table Spread Repeat this exercise 5-10 times with each hand. 1. Place your hand on a table with your palm facing down. Make sure your wrist is straight. 2. Spread your fingers out as much as possible. Hold this position for a few seconds. 3. Slide your fingers back together again. Hold for a few seconds.  Ball Grip  Repeat this exercise 10-15 times with each hand. 1. Hold a tennis ball or another soft ball in your hand. 2. While slowly increasing pressure, squeeze the ball as hard as possible. 3. Squeeze as hard as you can for 3-5 seconds. 4. Relax and repeat.  Wrist Curls Repeat this exercise 10-15 times with each hand. 1. Sit in a chair that has armrests. 2. Hold a light weight in your hand, such as a dumbbell that weighs 1-3 pounds (0.5-1.4 kg). Ask your health care provider what weight would be best for  you. 3. Rest your hand just over the end of the chair arm with your palm facing up. 4. Gently pivot your wrist up and down while holding the weight. Do not twist your wrist from side to side.  Contact a health care provider if:  Your hand pain or discomfort gets much worse when you do an exercise.  Your hand pain or discomfort does not improve within 2 hours after you exercise. If you have any of these problems, stop doing these exercises right away. Do not do them again unless your health care provider says that you can. Get help right away if:  You develop sudden, severe hand pain. If this happens, stop doing these exercises right away. Do not do them again unless your health care provider says that you can. This information is not intended to replace advice given to you by your health care provider. Make sure you discuss any questions you have with your health care provider. Document Released: 04/11/2015 Document Revised: 10/06/2015 Document Reviewed: 11/08/2014 Elsevier Interactive Patient Education  2018 [employer]  

## 2017-06-01 LAB — URINALYSIS, ROUTINE W REFLEX MICROSCOPIC
Bilirubin Urine: NEGATIVE
Glucose, UA: NEGATIVE
Hgb urine dipstick: NEGATIVE
Hyaline Cast: NONE SEEN /LPF
Ketones, ur: NEGATIVE
Nitrite: NEGATIVE
Protein, ur: NEGATIVE
RBC / HPF: NONE SEEN /HPF (ref 0–2)
Specific Gravity, Urine: 1.02 (ref 1.001–1.03)
pH: 5.5 (ref 5.0–8.0)

## 2017-06-01 LAB — CYCLIC CITRUL PEPTIDE ANTIBODY, IGG: Cyclic Citrullin Peptide Ab: <16 UNITS

## 2017-06-01 LAB — CBC WITH DIFFERENTIAL/PLATELET
Basophils Absolute: 47 cells/uL (ref 0–200)
Basophils Relative: 0.8 %
Eosinophils Absolute: 59 cells/uL (ref 15–500)
Eosinophils Relative: 1 %
HCT: 37.2 % (ref 35.0–45.0)
Hemoglobin: 12.7 g/dL (ref 11.7–15.5)
Lymphs Abs: 1729 cells/uL (ref 850–3900)
MCH: 30.6 pg (ref 27.0–33.0)
MCHC: 34.1 g/dL (ref 32.0–36.0)
MCV: 89.6 fL (ref 80.0–100.0)
MPV: 9.4 fL (ref 7.5–12.5)
Monocytes Relative: 5.9 %
Neutro Abs: 3717 cells/uL (ref 1500–7800)
Neutrophils Relative %: 63 %
Platelets: 261 10*3/uL (ref 140–400)
RBC: 4.15 10*6/uL (ref 3.80–5.10)
RDW: 12.3 % (ref 11.0–15.0)
Total Lymphocyte: 29.3 %
WBC mixed population: 348 cells/uL (ref 200–950)
WBC: 5.9 10*3/uL (ref 3.8–10.8)

## 2017-06-01 LAB — ANTI-NUCLEAR AB-TITER (ANA TITER): ANA Titer 1: 1:40 {titer} — ABNORMAL HIGH

## 2017-06-01 LAB — HLA-B27 ANTIGEN: HLA-B27 Antigen: NEGATIVE

## 2017-06-01 LAB — SEDIMENTATION RATE: Sed Rate: 2 mm/h (ref 0–30)

## 2017-06-01 LAB — URIC ACID: Uric Acid, Serum: 4.5 mg/dL (ref 2.5–7.0)

## 2017-06-01 LAB — ANA: Anti Nuclear Antibody(ANA): POSITIVE — AB

## 2017-06-01 LAB — ANGIOTENSIN CONVERTING ENZYME: Angiotensin-Converting Enzyme: 58 U/L (ref 9–67)

## 2017-06-01 LAB — RHEUMATOID FACTOR: Rhuematoid fact SerPl-aCnc: <14 IU/mL (ref <14)

## 2017-06-03 NOTE — Progress Notes (Signed)
We'll discuss at follow-up visit

## 2017-06-04 ENCOUNTER — Telehealth: Payer: Self-pay | Admitting: *Deleted

## 2017-06-04 NOTE — Telephone Encounter (Signed)
Copied from [place] [phone]. Topic: General - Other >> Jun 04, 2017  7:51 AM Carolyn Stare wrote: Pt said she had labs ordered by her rheumatology and is asking Allie Bossier to look at the labs.   [phone]

## 2017-06-04 NOTE — Telephone Encounter (Signed)
Please notify patient that I have reviewed her labs.  She will need to have her rheumatologist decipher the meaning of these labs.  Looks like she is scheduled for follow-up with her rheumatologist in February. She can also call their office for results.

## 2017-06-05 NOTE — Telephone Encounter (Signed)
Spoken and notified patient of [name]'s comments. Patient verbalized understanding. 

## 2017-06-11 DIAGNOSIS — M542 Cervicalgia: Secondary | ICD-10-CM | POA: Diagnosis not present

## 2017-06-17 DIAGNOSIS — R1030 Lower abdominal pain, unspecified: Secondary | ICD-10-CM | POA: Diagnosis not present

## 2017-06-17 DIAGNOSIS — R102 Pelvic and perineal pain: Secondary | ICD-10-CM | POA: Diagnosis not present

## 2017-06-17 DIAGNOSIS — N3010 Interstitial cystitis (chronic) without hematuria: Secondary | ICD-10-CM | POA: Diagnosis not present

## 2017-06-19 DIAGNOSIS — M6281 Muscle weakness (generalized): Secondary | ICD-10-CM | POA: Diagnosis not present

## 2017-06-19 DIAGNOSIS — R1030 Lower abdominal pain, unspecified: Secondary | ICD-10-CM | POA: Diagnosis not present

## 2017-06-19 DIAGNOSIS — M62838 Other muscle spasm: Secondary | ICD-10-CM | POA: Diagnosis not present

## 2017-06-24 DIAGNOSIS — R102 Pelvic and perineal pain: Secondary | ICD-10-CM | POA: Diagnosis not present

## 2017-06-24 DIAGNOSIS — M6281 Muscle weakness (generalized): Secondary | ICD-10-CM | POA: Diagnosis not present

## 2017-06-24 DIAGNOSIS — M62838 Other muscle spasm: Secondary | ICD-10-CM | POA: Diagnosis not present

## 2017-06-27 NOTE — Progress Notes (Signed)
Office Visit Note  Patient: Katie Robbins             Date of Birth: Mar 26, 1956           MRN: [id number]             PCP: Pleas Koch, NP Referring: Pleas Koch, NP Visit Date: 07/09/2017 Occupation: '@GUAROCC'$ @    Subjective:  Other (BIL hand pain and swelling )   History of Present Illness: Katie Robbins is a 62 y.o. female with history of intermittent swelling in her hands.  She states recently she has not had any swelling in her hands.  Her left knee and left hip pain has resolved.  States usually her arthritis is migratory and the pain moves around.  Activities of Daily Living:  Patient reports morning stiffness for 0 minute.   Patient Denies nocturnal pain.  Difficulty dressing/grooming: Denies Difficulty climbing stairs: Denies Difficulty getting out of chair: Denies Difficulty using hands for taps, buttons, cutlery, and/or writing: Denies   Review of Systems  Constitutional: Positive for fatigue. Negative for night sweats, weight gain, weight loss and weakness.  HENT: Positive for mouth dryness. Negative for mouth sores, trouble swallowing, trouble swallowing and nose dryness.   Eyes: Positive for dryness. Negative for pain, redness and visual disturbance.  Respiratory: Negative for cough, shortness of breath and difficulty breathing.   Cardiovascular: Positive for hypertension. Negative for chest pain, palpitations, irregular heartbeat and swelling in legs/feet.  Gastrointestinal: Positive for heartburn. Negative for blood in stool, constipation and diarrhea.  Endocrine: Negative for increased urination.  Genitourinary: Negative for vaginal dryness.  Musculoskeletal: Positive for arthralgias, joint pain and joint swelling. Negative for myalgias, muscle weakness, morning stiffness, muscle tenderness and myalgias.  Skin: Negative for color change, rash, hair loss, skin tightness, ulcers and sensitivity to sunlight.  Allergic/Immunologic: Negative for  susceptible to infections.  Neurological: Negative for dizziness, memory loss and night sweats.  Hematological: Negative for swollen glands.  Psychiatric/Behavioral: Positive for depressed mood. Negative for sleep disturbance. The patient is nervous/anxious.     PMFS History:  Patient Active Problem List   Diagnosis Date Noted  . Primary osteoarthritis of left knee 05/29/2017  . S/P carpal tunnel release 05/29/2017  . Primary osteoarthritis of both hands 05/29/2017  . History of bilateral breast implants 05/29/2017  . Interstitial cystitis 05/29/2017  . History of mitral valve prolapse 05/29/2017  . History of diverticulitis 05/29/2017  . Cervical disc disorder with radiculopathy of cervical region 04/12/2016  . Preventative health care 11/28/2015  . Esophageal reflux 08/19/2015  . Essential hypertension 08/19/2015  . Hypothyroidism 08/19/2015  . Asthma 08/19/2015  . Cognitive disorder 08/19/2015  . Chronic headaches 08/19/2015  . Estrogen deficiency 08/19/2015    Past Medical History:  Diagnosis Date  . Arthritis   . Asthma   . Chicken pox   . Chronic headaches   . Diverticulitis   . Essential hypertension   . GERD (gastroesophageal reflux disease)   . H/O breast implant   . Hypothyroidism   . Interstitial cystitis   . Mitral prolapse 1985  . Nutcracker esophagus     Family History  Problem Relation Age of Onset  . Hypertension Mother   . GER disease Mother   . Polymyalgia rheumatica Mother   . Arthritis Mother   . GER disease Father    Past Surgical History:  Procedure Laterality Date  . ABDOMINAL HYSTERECTOMY  2000/2009  . APPENDECTOMY  2007  . CARDIAC CATHETERIZATION  2016   Clean  . CARPAL TUNNEL RELEASE Right   . CHOLECYSTECTOMY  2012  . CHOLECYSTECTOMY  2012  . NECK SURGERY  06/11/2016   plates and screws in neck   . SMALL INTESTINE SURGERY  2007   Bowel resection  . TONSILLECTOMY    . [place]  2007   Social History    Social History Narrative  . Not on file     Objective: Vital Signs: BP (!) 144/88 (BP Location: Left Arm, Patient Position: Sitting, Cuff Size: Normal)   Pulse 80   Resp 14   Ht '5\' 3"'$  (1.6 m)   Wt 136 lb (61.7 kg)   BMI 24.09 kg/m    Physical Exam  Constitutional: She is oriented to person, place, and time. She appears well-developed and well-nourished.  HENT:  Head: Normocephalic and atraumatic.  Eyes: Conjunctivae and EOM are normal.  Neck: Normal range of motion.  Cardiovascular: Normal rate, regular rhythm, normal heart sounds and intact distal pulses.  Pulmonary/Chest: Effort normal and breath sounds normal.  Abdominal: Soft. Bowel sounds are normal.  Lymphadenopathy:    She has no cervical adenopathy.  Neurological: She is alert and oriented to person, place, and time.  Skin: Skin is warm and dry. Capillary refill takes less than 2 seconds.  Psychiatric: She has a normal mood and affect. Her behavior is normal.  Nursing note and vitals reviewed.    Musculoskeletal Exam: She has limited range of motion of her C-spine.  Thoracic and lumbar spine good range of motion.  Shoulder joints elbow joints wrist joints with good range of motion.  She has DIP PIP and CMC thickening bilaterally consistent with osteoarthritis.  No synovitis was noted on MCPs or PIP joints.  Hip joints, knee joints, ankles with good range of motion with no synovitis.  CDAI Exam: No CDAI exam completed.    Investigation: No additional findings.  Component     Latest Ref Rng & Units 05/29/2017  ANA Pattern 1      SPECKLED (A)  ANA Titer 1     titer 1:40 (H)  RA Latex Turbid.     <82 IU/mL <50  Cyclic Citrullin Peptide Ab     UNITS <16  HLA-B27 Antigen     Negative Negative  Uric Acid, Serum     2.5 - 7.0 mg/dL 4.5  Sed Rate     0 - 30 mm/h 2  Angiotensin-Converting Enzyme     9 - 67 U/L 58  Anit Nuclear Antibody(ANA)     NEGATIVE POSITIVE (A)   CBC Latest Ref Rng & Units 05/29/2017  11/23/2015 10/13/2015  WBC 3.8 - 10.8 Thousand/uL 5.9 6.6 5.6  Hemoglobin 11.7 - 15.5 g/dL 12.7 11.8(L) 11.7(L)  Hematocrit 35.0 - 45.0 % 37.2 34.7(L) 35.2(L)  Platelets 140 - 400 Thousand/uL 261 266.0 263.0   CMP Latest Ref Rng & Units 04/17/2017 11/23/2016 01/09/2016  Glucose 70 - 99 mg/dL 123(H) 117(H) 101(H)  BUN 6 - 23 mg/dL '13 14 17  '$ Creatinine 0.40 - 1.20 mg/dL 1.16 1.04(H) 1.07  Sodium 135 - 145 mEq/L 139 138 137  Potassium 3.5 - 5.1 mEq/L 3.9 4.1 4.2  Chloride 96 - 112 mEq/L 103 103 102  CO2 19 - 32 mEq/L '28 22 28  '$ Calcium 8.4 - 10.5 mg/dL 8.9 8.9 8.9  Total Protein 6.0 - 8.3 g/dL 7.2 6.5 -  Total Bilirubin 0.2 - 1.2 mg/dL 0.4 0.3 -  Alkaline Phos 39 - 117 U/L 66  63 -  AST 0 - 37 U/L 16 13 -  ALT 0 - 35 U/L 8 8 -    Imaging: No results found.  Speciality Comments: No specialty comments available.    Procedures:  No procedures performed Allergies: Nitrofurantoin; Sulfamethoxazole-trimethoprim; Tizanidine; Vicodin [hydrocodone-acetaminophen]; Bactrim [sulfamethoxazole-trimethoprim]; Hydrocodone; Macrobid [nitrofurantoin macrocrystal]; Nexium [esomeprazole magnesium]; Prilosec [omeprazole]; Sulfa antibiotics; and Erythromycin   Assessment / Plan:     Visit Diagnoses: History of inflammatory arthritis - No synovitis on exam, RF -, CCP -ultrasound scheduledPreviously treated with MTX.  Patient has no synovitis on examination.  She gives history of intermittent swelling.  Although she has not had any episodes of swelling recently.  I have advised her to take some photographs of her hands in case she develops any joint swelling.  We will also schedule ultrasound of her bilateral hands to evaluate for synovitis.  Primary osteoarthritis of both hands: The clinical and radiographic examination is consistent with osteoarthritis of her hands.  A list of natural anti-inflammatories was given.  And muscle strengthening exercises and joint protection was discussed.  The  Primary  osteoarthritis of left knee: Doing better.  DDD (degenerative disc disease), cervical - C3-C7 fusion on 06/11/16, Dr. Saintclair Halsted.  She has limited range of motion.  S/P carpal tunnel release - Right  Other fatigue  Essential hypertension: Her blood pressure is slightly elevated.  Have advised her to monitor blood pressure closely.  History of diverticulitis  History of hypothyroidism  History of mitral valve prolapse  History of gastroesophageal reflux (GERD)  Interstitial cystitis: She is having a flare.   Orders: No orders of the defined types were placed in this encounter.  No orders of the defined types were placed in this encounter.   Face-to-face time spent with patient was 30 minutes.  Greater than 50% of time was spent in counseling and coordination of care.  Follow-Up Instructions: Return in about 6 months (around 01/06/2018) for Osteoarthritis,DDD.   Bo Merino, MD  Note - This record has been created using Editor, commissioning.  Chart creation errors have been sought, but may not always  have been located. Such creation errors do not reflect on  the standard of medical care.

## 2017-06-28 ENCOUNTER — Ambulatory Visit: Payer: Self-pay | Admitting: Rheumatology

## 2017-07-02 DIAGNOSIS — M6281 Muscle weakness (generalized): Secondary | ICD-10-CM | POA: Diagnosis not present

## 2017-07-02 DIAGNOSIS — M62838 Other muscle spasm: Secondary | ICD-10-CM | POA: Diagnosis not present

## 2017-07-02 DIAGNOSIS — R1030 Lower abdominal pain, unspecified: Secondary | ICD-10-CM | POA: Diagnosis not present

## 2017-07-09 ENCOUNTER — Encounter: Payer: Self-pay | Admitting: Rheumatology

## 2017-07-09 ENCOUNTER — Ambulatory Visit: Payer: 59 | Admitting: Rheumatology

## 2017-07-09 VITALS — BP 144/88 | HR 80 | Resp 14 | Ht 63.0 in | Wt 136.0 lb

## 2017-07-09 DIAGNOSIS — M19042 Primary osteoarthritis, left hand: Secondary | ICD-10-CM | POA: Diagnosis not present

## 2017-07-09 DIAGNOSIS — M5030 Other cervical disc degeneration, unspecified cervical region: Secondary | ICD-10-CM | POA: Diagnosis not present

## 2017-07-09 DIAGNOSIS — M1712 Unilateral primary osteoarthritis, left knee: Secondary | ICD-10-CM | POA: Diagnosis not present

## 2017-07-09 DIAGNOSIS — N3010 Interstitial cystitis (chronic) without hematuria: Secondary | ICD-10-CM

## 2017-07-09 DIAGNOSIS — R5383 Other fatigue: Secondary | ICD-10-CM

## 2017-07-09 DIAGNOSIS — Z8739 Personal history of other diseases of the musculoskeletal system and connective tissue: Secondary | ICD-10-CM

## 2017-07-09 DIAGNOSIS — Z8639 Personal history of other endocrine, nutritional and metabolic disease: Secondary | ICD-10-CM | POA: Diagnosis not present

## 2017-07-09 DIAGNOSIS — Z8679 Personal history of other diseases of the circulatory system: Secondary | ICD-10-CM

## 2017-07-09 DIAGNOSIS — I10 Essential (primary) hypertension: Secondary | ICD-10-CM

## 2017-07-09 DIAGNOSIS — Z9889 Other specified postprocedural states: Secondary | ICD-10-CM

## 2017-07-09 DIAGNOSIS — M19041 Primary osteoarthritis, right hand: Secondary | ICD-10-CM

## 2017-07-09 DIAGNOSIS — Z8719 Personal history of other diseases of the digestive system: Secondary | ICD-10-CM | POA: Diagnosis not present

## 2017-07-09 NOTE — Patient Instructions (Addendum)
Hand Exercises Hand exercises can be helpful to almost anyone. These exercises can strengthen the hands, improve flexibility and movement, and increase blood flow to the hands. These results can make work and daily tasks easier. Hand exercises can be especially helpful for people who have joint pain from arthritis or have nerve damage from overuse (carpal tunnel syndrome). These exercises can also help people who have injured a hand. Most of these hand exercises are fairly gentle stretching routines. You can do them often throughout the day. Still, it is a good idea to ask your health care provider which exercises would be best for you. Warming your hands before exercise may help to reduce stiffness. You can do this with gentle massage or by placing your hands in warm water for 15 minutes. Also, make sure you pay attention to your level of hand pain as you begin an exercise routine. Exercises Knuckle Bend Repeat this exercise 5-10 times with each hand. 1. Stand or sit with your arm, hand, and all five fingers pointed straight up. Make sure your wrist is straight. 2. Gently and slowly bend your fingers down and inward until the tips of your fingers are touching the tops of your palm. 3. Hold this position for a few seconds. 4. Extend your fingers out to their original position, all pointing straight up again.  Finger Fan Repeat this exercise 5-10 times with each hand. 1. Hold your arm and hand out in front of you. Keep your wrist straight. 2. Squeeze your hand into a fist. 3. Hold this position for a few seconds. 4. Fan out, or spread apart, your hand and fingers as much as possible, stretching every joint fully.  Tabletop Repeat this exercise 5-10 times with each hand. 1. Stand or sit with your arm, hand, and all five fingers pointed straight up. Make sure your wrist is straight. 2. Gently and slowly bend your fingers at the knuckles where they meet the hand until your hand is making an  upside-down L shape. Your fingers should form a tabletop. 3. Hold this position for a few seconds. 4. Extend your fingers out to their original position, all pointing straight up again.  Making Os Repeat this exercise 5-10 times with each hand. 1. Stand or sit with your arm, hand, and all five fingers pointed straight up. Make sure your wrist is straight. 2. Make an O shape by touching your pointer finger to your thumb. Hold for a few seconds. Then open your hand wide. 3. Repeat this motion with each finger on your hand.  Table Spread Repeat this exercise 5-10 times with each hand. 1. Place your hand on a table with your palm facing down. Make sure your wrist is straight. 2. Spread your fingers out as much as possible. Hold this position for a few seconds. 3. Slide your fingers back together again. Hold for a few seconds.  Ball Grip  Repeat this exercise 10-15 times with each hand. 1. Hold a tennis ball or another soft ball in your hand. 2. While slowly increasing pressure, squeeze the ball as hard as possible. 3. Squeeze as hard as you can for 3-5 seconds. 4. Relax and repeat.  Wrist Curls Repeat this exercise 10-15 times with each hand. 1. Sit in a chair that has armrests. 2. Hold a light weight in your hand, such as a dumbbell that weighs 1-3 pounds (0.5-1.4 kg). Ask your health care provider what weight would be best for you. 3. Rest your hand just over   just over the end of the chair arm with your palm facing up. 4. Gently pivot your wrist up and down while holding the weight. Do not twist your wrist from side to side.  Contact a health care provider if:  Your hand pain or discomfort gets much worse when you do an exercise.  Your hand pain or discomfort does not improve within 2 hours after you exercise. If you have any of these problems, stop doing these exercises right away. Do not do them again unless your health care provider says that you can. Get help right away if:  You  develop sudden, severe hand pain. If this happens, stop doing these exercises right away. Do not do them again unless your health care provider says that you can. This information is not intended to replace advice given to you by your health care provider. Make sure you discuss any questions you have with your health care provider. Document Released: 04/11/2015 Document Revised: 10/06/2015 Document Reviewed: 11/08/2014 Elsevier Interactive Patient Education  2018 Elsevier Inc. Natural anti-inflammatories  You can purchase these at [employer], [employer] or online.  . Turmeric (capsules)  . Ginger (ginger root or capsules)  . Omega 3 (Fish, flax seeds, chia seeds, walnuts, almonds)  . Tart cherry (dried or extract)   Patient should be under the care of a physician while taking these supplements. This may not be reproduced without the permission of Dr. Rilen Shukla.  

## 2017-07-10 ENCOUNTER — Ambulatory Visit: Payer: Self-pay | Admitting: Rheumatology

## 2017-07-16 DIAGNOSIS — M542 Cervicalgia: Secondary | ICD-10-CM | POA: Diagnosis not present

## 2017-07-16 DIAGNOSIS — R270 Ataxia, unspecified: Secondary | ICD-10-CM | POA: Diagnosis not present

## 2017-07-16 DIAGNOSIS — R32 Unspecified urinary incontinence: Secondary | ICD-10-CM | POA: Insufficient documentation

## 2017-07-17 ENCOUNTER — Ambulatory Visit: Payer: 59 | Admitting: Rheumatology

## 2017-07-17 ENCOUNTER — Ambulatory Visit (INDEPENDENT_AMBULATORY_CARE_PROVIDER_SITE_OTHER): Payer: Self-pay

## 2017-07-17 DIAGNOSIS — M79642 Pain in left hand: Secondary | ICD-10-CM

## 2017-07-17 DIAGNOSIS — M6281 Muscle weakness (generalized): Secondary | ICD-10-CM | POA: Diagnosis not present

## 2017-07-17 DIAGNOSIS — M62838 Other muscle spasm: Secondary | ICD-10-CM | POA: Diagnosis not present

## 2017-07-17 DIAGNOSIS — R1030 Lower abdominal pain, unspecified: Secondary | ICD-10-CM | POA: Diagnosis not present

## 2017-07-17 DIAGNOSIS — M79641 Pain in right hand: Secondary | ICD-10-CM | POA: Diagnosis not present

## 2017-07-17 NOTE — Progress Notes (Signed)
The ultrasound examination of bilateral hands did not show any evidence of inflammatory arthritis.  The findings were consistent with osteoarthritis.  She has severe CMC arthritis of bilateral hands.  Bilateral median nerves are within normal limits. Patient has been advised to contact us in case she gets increased swelling in her joints. Katie Robbins

## 2017-07-18 ENCOUNTER — Telehealth: Payer: Self-pay | Admitting: Primary Care

## 2017-07-18 DIAGNOSIS — K219 Gastro-esophageal reflux disease without esophagitis: Secondary | ICD-10-CM

## 2017-07-18 MED ORDER — PANTOPRAZOLE SODIUM 40 MG PO TBEC: 40.0000 mg | DELAYED_RELEASE_TABLET | Freq: Two times a day (BID) | ORAL | 5 refills | Status: DC

## 2017-07-18 NOTE — Telephone Encounter (Signed)
Copied from [place]. Topic: Quick Communication - Rx Refill/Question >> Jul 18, 2017 12:22 PM Ether Griffins B wrote: Medication: pantoprazole (PROTONIX) 40 MG tablet   Has the patient contacted their pharmacy? Yes.     (Agent: If no, request that the patient contact the pharmacy for the refill.)   Preferred Pharmacy (with phone number or street name): [place], [place]: Please be advised that RX refills may take up to 3 business days. We ask that you follow-up with your pharmacy.

## 2017-07-18 NOTE — Addendum Note (Signed)
Addended by: Matilde Sprang on: 07/18/2017 03:16 PM   Modules accepted: Orders

## 2017-07-18 NOTE — Telephone Encounter (Signed)
Patient called to verify how she takes the Protonix, she says "I take it twice a day." I advised the prescription would be sent to [facility].

## 2017-07-19 ENCOUNTER — Ambulatory Visit: Payer: 59 | Admitting: Primary Care

## 2017-07-19 ENCOUNTER — Encounter: Payer: Self-pay | Admitting: Primary Care

## 2017-07-19 VITALS — BP 130/82 | HR 78 | Temp 98.0°F | Ht 63.0 in | Wt 141.0 lb

## 2017-07-19 DIAGNOSIS — K047 Periapical abscess without sinus: Secondary | ICD-10-CM | POA: Diagnosis not present

## 2017-07-19 MED ORDER — AMOXICILLIN 875 MG PO TABS: 875.0000 mg | ORAL_TABLET | Freq: Two times a day (BID) | ORAL | 0 refills | Status: DC

## 2017-07-19 NOTE — Patient Instructions (Signed)
Start amoxicillin antibiotics for the infection. Take 1 tablet by mouth twice daily for 7 days.  Follow up with your dentist as scheduled.  It was a pleasure to see you today!

## 2017-07-19 NOTE — Progress Notes (Signed)
Subjective:    Patient ID: Katie Robbins, female    DOB: 15-Jun-1955, 62 y.o.   MRN: [id number]  HPI  Katie Robbins is a 62 year old female who presents today with a chief complaint of dental pain.   History of dental cap. She has been diagnosed with a fistula to her left lower molar that has been present for months. She has a dental appointment on March 19th for repair. She's noticed gum tenderness for months, and noticed puss to the left lower molar base about one week ago. She called her dentist who referred her here. She denies pain, fevers, facial swelling.    Review of Systems  Constitutional: Negative for fever.  HENT: Positive for dental problem. Negative for ear pain and facial swelling.        Past Medical History:  Diagnosis Date  . Arthritis   . Asthma   . Chicken pox   . Chronic headaches   . Diverticulitis   . Essential hypertension   . GERD (gastroesophageal reflux disease)   . H/O breast implant   . Hypothyroidism   . Interstitial cystitis   . Mitral prolapse 1985  . Nutcracker esophagus      Social History   Socioeconomic History  . Marital status: Married    Spouse name: Not on file  . Number of children: Not on file  . Years of education: Not on file  . Highest education level: Not on file  Social Needs  . Financial resource strain: Not on file  . Food insecurity - worry: Not on file  . Food insecurity - inability: Not on file  . Transportation needs - medical: Not on file  . Transportation needs - non-medical: Not on file  Occupational History  . Not on file  Tobacco Use  . Smoking status: Never Smoker  . Smokeless tobacco: Never Used  Substance and Sexual Activity  . Alcohol use: Yes    Comment: occasionally   . Drug use: No  . Sexual activity: Not on file  Other Topics Concern  . Not on file  Social History Narrative  . Not on file    Past Surgical History:  Procedure Laterality Date  . ABDOMINAL HYSTERECTOMY  2000/2009  .  APPENDECTOMY  2007  . CARDIAC CATHETERIZATION  2016   Clean  . CARPAL TUNNEL RELEASE Right   . CHOLECYSTECTOMY  2012  . CHOLECYSTECTOMY  2012  . NECK SURGERY  06/11/2016   plates and screws in neck   . SMALL INTESTINE SURGERY  2007   Bowel resection  . TONSILLECTOMY    . VESICO-VAGINAL FISTULA REPAIR  2007    Family History  Problem Relation Age of Onset  . Hypertension Mother   . GER disease Mother   . Polymyalgia rheumatica Mother   . Arthritis Mother   . GER disease Father     Allergies  Allergen Reactions  . Nitrofurantoin Hives  . Sulfamethoxazole-Trimethoprim Hives  . Tizanidine Other (See Comments)    Bad Mood changs  . Vicodin [Hydrocodone-Acetaminophen] Swelling    Puffy eyes and face  . Bactrim [Sulfamethoxazole-Trimethoprim]   . Hydrocodone   . Macrobid [Nitrofurantoin Macrocrystal]   . Nexium [Esomeprazole Magnesium]     Face swelling, hives   . Prilosec [Omeprazole]     Face swelling, hives   . Sulfa Antibiotics   . Erythromycin Rash    Current Outpatient Medications on File Prior to Visit  Medication Sig Dispense Refill  . albuterol (PROAIR  HFA) 108 (90 Base) MCG/ACT inhaler Inhale 2 puffs into the lungs every 6 (six) hours as needed for wheezing or shortness of breath. 1 Inhaler 5  . amLODipine (NORVASC) 10 MG tablet Take 1 tablet (10 mg total) by mouth daily. (Patient taking differently: Take 5 mg by mouth daily. ) 90 tablet 3  . amphetamine-dextroamphetamine (ADDERALL) 20 MG tablet Taking 1/2 to 1 tablet 4 times a day needed    . BD PEN NEEDLE NANO U/F 32G X 4 MM MISC USE ONE  4 TIMES DAILY WITH  INSULIN  PEN 300 each 1  . budesonide (PULMICORT) 180 MCG/ACT inhaler Inhale 2 puffs into the lungs 2 (two) times daily. 1 Inhaler 5  . cyanocobalamin (,VITAMIN B-12,) 1000 MCG/ML injection Inject 1 ml into the muscle every 3-4 weeks. 14 mL 3  . diazepam (VALIUM) 5 MG tablet Take 5 mg by mouth every 6 (six) hours as needed.     Marland Kitchen estradiol (ESTRACE) 1 MG  tablet Take 1 mg by mouth daily.    Marland Kitchen levothyroxine (SYNTHROID, LEVOTHROID) 112 MCG tablet Take 1 tablet (112 mcg total) by mouth daily. 90 tablet 3  . magic mouthwash SOLN Take 15 mLs by mouth 3 (three) times daily as needed for mouth pain (Modified Magic Mouthwash). 360 mL 0  . olmesartan (BENICAR) 20 MG tablet Take 1 tablet (20 mg total) by mouth 2 (two) times daily. 60 tablet 1  . pantoprazole (PROTONIX) 40 MG tablet Take 1 tablet (40 mg total) by mouth 2 (two) times daily. 60 tablet 5   No current facility-administered medications on file prior to visit.     BP 130/82   Pulse 78   Temp 98 F (36.7 C) (Oral)   Ht 5\' 3"  (1.6 m)   Wt 141 lb (64 kg)   SpO2 98%   BMI 24.98 kg/m    Objective:   Physical Exam  Constitutional: She appears well-nourished.  HENT:  Mild erythema to gumline of left lower molars, gap in between left lower molar of concern. No drainage.   Neck: Neck supple.  Cardiovascular: Normal rate and regular rhythm.  Pulmonary/Chest: Effort normal and breath sounds normal.  Lymphadenopathy:    She has no cervical adenopathy.          Assessment & Plan:  Dental Abscess:  History of cap to left lower molar of concern 17 years ago. Exam today with evidence of molar irritation.  Given expulsion of moderate amount of puss, coupled with exam, will treat. Rx for Amoxil course sent to pharmacy.  She will follow up with her dentist as scheduled.  Pleas Koch, NP

## 2017-07-23 DIAGNOSIS — M6281 Muscle weakness (generalized): Secondary | ICD-10-CM | POA: Diagnosis not present

## 2017-07-23 DIAGNOSIS — R102 Pelvic and perineal pain: Secondary | ICD-10-CM | POA: Diagnosis not present

## 2017-07-23 DIAGNOSIS — M62838 Other muscle spasm: Secondary | ICD-10-CM | POA: Diagnosis not present

## 2017-07-30 ENCOUNTER — Telehealth: Payer: Self-pay | Admitting: *Deleted

## 2017-07-30 DIAGNOSIS — R270 Ataxia, unspecified: Secondary | ICD-10-CM | POA: Diagnosis not present

## 2017-07-30 DIAGNOSIS — M50221 Other cervical disc displacement at C4-C5 level: Secondary | ICD-10-CM | POA: Diagnosis not present

## 2017-07-30 DIAGNOSIS — K047 Periapical abscess without sinus: Secondary | ICD-10-CM

## 2017-07-30 NOTE — Telephone Encounter (Signed)
Appreciate the update 

## 2017-07-30 NOTE — Telephone Encounter (Signed)
Copied from [place] [phone]. Topic: General - Other >> Jul 30, 2017 12:51 PM Cecelia Byars, NT wrote: Reason for CRM: Patient called and said she has an infection underneath the cap in here  tooth  and  gum she was given antibiotic   on 07/19/17 by Belenda Cruise for it, ihe tooth will be will be extracted and the infection has gone to the bone this is for information purposes ,the extraction will be on 08/20/17 she has a MRI on today ,she does not need more antbiotics

## 2017-08-01 DIAGNOSIS — M542 Cervicalgia: Secondary | ICD-10-CM | POA: Diagnosis not present

## 2017-08-01 DIAGNOSIS — R270 Ataxia, unspecified: Secondary | ICD-10-CM | POA: Diagnosis not present

## 2017-08-01 NOTE — Telephone Encounter (Signed)
Noted, did the dentist put her on any antibiotics after the procedure? Did she complete the 7 day course of Amoxil I put her on? I'll send something later today, just need some information first.

## 2017-08-01 NOTE — Telephone Encounter (Signed)
Pt states she is going to [place] soon and will be gone for 3 weeks. Pt very nervous that something will happen and she may need abx. She would feel better if she had a few more days of abx because the dentist did a lot of scraping. Pt has appt with Neurosurgeon at 11am. Please advise after 1:30pm.  Call back [place], [place] - [place] [phone] (Phone) [phone] (Fax)

## 2017-08-02 ENCOUNTER — Encounter: Payer: Self-pay | Admitting: Primary Care

## 2017-08-02 NOTE — Telephone Encounter (Signed)
Left message to call office.  PEC, please advise pt of [name]'s message below and let us know her responses. Thanks

## 2017-08-05 ENCOUNTER — Encounter: Payer: Self-pay | Admitting: Primary Care

## 2017-08-06 NOTE — Telephone Encounter (Signed)
Message left for patient to return my call.  

## 2017-08-07 ENCOUNTER — Other Ambulatory Visit: Payer: 59 | Admitting: Rheumatology

## 2017-08-07 NOTE — Telephone Encounter (Signed)
Noted  

## 2017-08-07 NOTE — Telephone Encounter (Signed)
Spoken to patient. She stated that the dentist did not give her any antibiotics. Yes, she did complete the 7 day course.  She stated that she will take the word of her dentist that she will not be needing antibiotics. She stated she feels fine and just have a dry socket.

## 2017-08-19 DIAGNOSIS — M542 Cervicalgia: Secondary | ICD-10-CM | POA: Diagnosis not present

## 2017-08-26 ENCOUNTER — Other Ambulatory Visit: Payer: Self-pay | Admitting: Primary Care

## 2017-08-26 ENCOUNTER — Other Ambulatory Visit: Payer: Self-pay | Admitting: *Deleted

## 2017-08-26 DIAGNOSIS — K224 Dyskinesia of esophagus: Secondary | ICD-10-CM

## 2017-08-26 MED ORDER — AMLODIPINE BESYLATE 10 MG PO TABS: 10.0000 mg | ORAL_TABLET | Freq: Every day | ORAL | 0 refills | Status: DC

## 2017-08-26 NOTE — Telephone Encounter (Signed)
Rx for amlodipine refilled per protocol.  Rx refill request: magic mouth wash  LOV: 04/17/17  PCP: [place]: verified

## 2017-08-26 NOTE — Telephone Encounter (Signed)
Copied from [place] [id number]. Topic: Quick Communication - Rx Refill/Question >> Aug 26, 2017 10:09 AM Bea Graff, NT wrote: Medication: amLODipine (NORVASC) 5mg  60 tablets, magic mouthwash SOLN Has the patient contacted their pharmacy? Yes.   (Agent: If no, request that the patient contact the pharmacy for the refill.) Preferred Pharmacy (with phone number or street name): [place], [place] [phone] (Phone) [phone] (Fax)   Agent: Please be advised that RX refills may take up to 3 business days. We ask that you follow-up with your pharmacy.

## 2017-08-28 MED ORDER — MAGIC MOUTHWASH: 15.0000 mL | Freq: Three times a day (TID) | ORAL | 0 refills | Status: DC | PRN

## 2017-08-28 NOTE — Telephone Encounter (Signed)
Noted, can we fax this to her pharmacy? It won't let me send electronically? Rx placed in Chan's inbox.

## 2017-08-28 NOTE — Telephone Encounter (Signed)
Faxed Rx as instructed. °

## 2017-09-04 ENCOUNTER — Telehealth: Payer: Self-pay | Admitting: Primary Care

## 2017-09-04 DIAGNOSIS — K224 Dyskinesia of esophagus: Secondary | ICD-10-CM

## 2017-09-04 NOTE — Telephone Encounter (Signed)
Her blood pressure has been high and borderline high on prior visits. Has she always been breaking Norvasc 10 mg in half to take 5 mg? When did she start doing that? Does she check her BP at home? What's it running?

## 2017-09-04 NOTE — Telephone Encounter (Signed)
Received faxed from [place] stating  Patient was prevoiusly taking Norvasc 10 mg and breaking it in half. She would now like the 5 mg tablet due to having to waste the other half of the 10 mg tablet. Please advise.  It was last refilled on 08/26/2017 and sig was take 1 tablet (10 mg total) by mouth daily

## 2017-09-05 NOTE — Telephone Encounter (Signed)
Message left for patient to return my call.  

## 2017-09-06 MED ORDER — AMLODIPINE BESYLATE 5 MG PO TABS: ORAL_TABLET | ORAL | 1 refills | Status: DC

## 2017-09-06 NOTE — Telephone Encounter (Signed)
Spoken to patient. Patient stated that she take this for her esophageal spasm not blood presssure. She will most of the time break the 10 mg in half but there days she take a whole 10 mg.  She has been doing this for while. No, she does not check her BP at home.

## 2017-09-06 NOTE — Telephone Encounter (Signed)
Noted, please apologize as I did overlook that detail.  I'll send Amlodipine 5 mg, but I want her to start monitoring her BP at home and report readings at or above 135/90. We will see her this summer for her CPE.

## 2017-09-06 NOTE — Telephone Encounter (Signed)
Message left for patient to return my call.  

## 2017-09-09 NOTE — Telephone Encounter (Signed)
Message left for patient to return my call.  

## 2017-09-12 NOTE — Telephone Encounter (Signed)
Message left for patient to return my call.  

## 2017-09-30 DIAGNOSIS — M542 Cervicalgia: Secondary | ICD-10-CM | POA: Diagnosis not present

## 2017-10-08 DIAGNOSIS — M6281 Muscle weakness (generalized): Secondary | ICD-10-CM | POA: Diagnosis not present

## 2017-10-08 DIAGNOSIS — M62838 Other muscle spasm: Secondary | ICD-10-CM | POA: Diagnosis not present

## 2017-10-08 DIAGNOSIS — R1030 Lower abdominal pain, unspecified: Secondary | ICD-10-CM | POA: Diagnosis not present

## 2017-10-09 ENCOUNTER — Encounter

## 2017-10-09 ENCOUNTER — Encounter: Payer: Self-pay | Admitting: Neurology

## 2017-10-09 ENCOUNTER — Other Ambulatory Visit: Payer: Self-pay

## 2017-10-09 ENCOUNTER — Ambulatory Visit: Payer: 59 | Admitting: Neurology

## 2017-10-09 ENCOUNTER — Encounter: Payer: Self-pay | Admitting: Psychology

## 2017-10-09 VITALS — BP 156/83 | HR 84 | Ht 63.75 in | Wt 141.0 lb

## 2017-10-09 DIAGNOSIS — R413 Other amnesia: Secondary | ICD-10-CM

## 2017-10-09 NOTE — Patient Instructions (Signed)
We will get Neuropsychological testing set up.
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
## 2017-10-11 LAB — COPPER, SERUM: Copper: 213 ug/dL — ABNORMAL HIGH (ref 72–166)

## 2017-10-11 LAB — B. BURGDORFI ANTIBODIES: Lyme IgG/IgM Ab: <0.91 {ISR} (ref 0.00–0.90)

## 2017-10-11 LAB — RPR: RPR Ser Ql: NONREACTIVE

## 2017-10-11 LAB — SEDIMENTATION RATE: Sed Rate: 2 mm/hr (ref 0–40)

## 2017-10-11 LAB — HIV ANTIBODY (ROUTINE TESTING W REFLEX): HIV Screen 4th Generation wRfx: NONREACTIVE

## 2017-10-14 ENCOUNTER — Telehealth: Payer: Self-pay | Admitting: Neurology

## 2017-10-14 NOTE — Telephone Encounter (Signed)
I have received the results of the MRI of the brain and cervical spine done by Dr. Saintclair Halsted.  MRI of the brain was done without contrast and shows no acute or reversible finding, chronic small vessel ischemia changes affecting the pons, cerebellum, thalamus, basal ganglia, and hemispheric white matter, progressive from 2015.  White matter changes are felt to be mild to moderate in nature.  MRI of the cervical spine was done on 30 July 2017.  This shows C3-4 and C5-C7 fusion without residual stenosis mild left foraminal stenosis at the C2-3 level with advanced facet arthrosis.  The degree of white matter changes is not severe, but certainly could be a factor in part with the memory disturbance and gait instability.

## 2017-10-23 DIAGNOSIS — R1030 Lower abdominal pain, unspecified: Secondary | ICD-10-CM | POA: Diagnosis not present

## 2017-10-23 DIAGNOSIS — M62838 Other muscle spasm: Secondary | ICD-10-CM | POA: Diagnosis not present

## 2017-10-23 DIAGNOSIS — M6281 Muscle weakness (generalized): Secondary | ICD-10-CM | POA: Diagnosis not present

## 2017-10-29 ENCOUNTER — Encounter: Payer: Self-pay | Admitting: Primary Care

## 2017-10-30 ENCOUNTER — Encounter: Payer: Self-pay | Admitting: Primary Care

## 2017-10-31 ENCOUNTER — Other Ambulatory Visit: Payer: Self-pay | Admitting: Primary Care

## 2017-10-31 DIAGNOSIS — R7303 Prediabetes: Secondary | ICD-10-CM

## 2017-10-31 DIAGNOSIS — I10 Essential (primary) hypertension: Secondary | ICD-10-CM

## 2017-10-31 DIAGNOSIS — E039 Hypothyroidism, unspecified: Secondary | ICD-10-CM

## 2017-11-20 DIAGNOSIS — M6281 Muscle weakness (generalized): Secondary | ICD-10-CM | POA: Diagnosis not present

## 2017-11-20 DIAGNOSIS — M62838 Other muscle spasm: Secondary | ICD-10-CM | POA: Diagnosis not present

## 2017-11-20 DIAGNOSIS — R1030 Lower abdominal pain, unspecified: Secondary | ICD-10-CM | POA: Diagnosis not present

## 2017-11-22 ENCOUNTER — Encounter: Payer: Self-pay | Admitting: Primary Care

## 2017-11-22 DIAGNOSIS — N3010 Interstitial cystitis (chronic) without hematuria: Secondary | ICD-10-CM

## 2017-11-22 DIAGNOSIS — E2839 Other primary ovarian failure: Secondary | ICD-10-CM

## 2017-11-25 ENCOUNTER — Other Ambulatory Visit (INDEPENDENT_AMBULATORY_CARE_PROVIDER_SITE_OTHER): Payer: 59

## 2017-11-25 DIAGNOSIS — E039 Hypothyroidism, unspecified: Secondary | ICD-10-CM | POA: Diagnosis not present

## 2017-11-25 DIAGNOSIS — E2839 Other primary ovarian failure: Secondary | ICD-10-CM | POA: Diagnosis not present

## 2017-11-25 DIAGNOSIS — R7303 Prediabetes: Secondary | ICD-10-CM | POA: Diagnosis not present

## 2017-11-25 DIAGNOSIS — N3010 Interstitial cystitis (chronic) without hematuria: Secondary | ICD-10-CM

## 2017-11-25 DIAGNOSIS — I10 Essential (primary) hypertension: Secondary | ICD-10-CM | POA: Diagnosis not present

## 2017-11-25 LAB — HEMOGLOBIN A1C: Hgb A1c MFr Bld: 5.7 % (ref 4.6–6.5)

## 2017-11-25 LAB — URINALYSIS, ROUTINE W REFLEX MICROSCOPIC
Bilirubin Urine: NEGATIVE
Hgb urine dipstick: NEGATIVE
Ketones, ur: NEGATIVE
Leukocytes, UA: NEGATIVE
Nitrite: NEGATIVE
Specific Gravity, Urine: 1.025 (ref 1.000–1.030)
Total Protein, Urine: NEGATIVE
Urine Glucose: NEGATIVE
Urobilinogen, UA: 0.2 (ref 0.0–1.0)
pH: 6 (ref 5.0–8.0)

## 2017-11-25 LAB — LIPID PANEL
Cholesterol: 190 mg/dL (ref 0–200)
HDL: 85.1 mg/dL (ref >39.00)
LDL Cholesterol: 82 mg/dL (ref 0–99)
NonHDL: 104.92
Total CHOL/HDL Ratio: 2
Triglycerides: 113 mg/dL (ref 0.0–149.0)
VLDL: 22.6 mg/dL (ref 0.0–40.0)

## 2017-11-25 LAB — COMPREHENSIVE METABOLIC PANEL
ALT: 9 U/L (ref 0–35)
AST: 12 U/L (ref 0–37)
Albumin: 4 g/dL (ref 3.5–5.2)
Alkaline Phosphatase: 75 U/L (ref 39–117)
BUN: 17 mg/dL (ref 6–23)
CO2: 27 mEq/L (ref 19–32)
Calcium: 9 mg/dL (ref 8.4–10.5)
Chloride: 103 mEq/L (ref 96–112)
Creatinine, Ser: 1.18 mg/dL (ref 0.40–1.20)
GFR: 49.31 mL/min — ABNORMAL LOW (ref >60.00)
Glucose, Bld: 114 mg/dL — ABNORMAL HIGH (ref 70–99)
Potassium: 4.2 mEq/L (ref 3.5–5.1)
Sodium: 139 mEq/L (ref 135–145)
Total Bilirubin: 0.3 mg/dL (ref 0.2–1.2)
Total Protein: 7.2 g/dL (ref 6.0–8.3)

## 2017-11-25 LAB — TSH: TSH: 3.22 u[IU]/mL (ref 0.35–4.50)

## 2017-11-27 DIAGNOSIS — M542 Cervicalgia: Secondary | ICD-10-CM | POA: Diagnosis not present

## 2017-11-27 DIAGNOSIS — M62838 Other muscle spasm: Secondary | ICD-10-CM | POA: Diagnosis not present

## 2017-11-27 DIAGNOSIS — M6281 Muscle weakness (generalized): Secondary | ICD-10-CM | POA: Diagnosis not present

## 2017-11-27 DIAGNOSIS — R1030 Lower abdominal pain, unspecified: Secondary | ICD-10-CM | POA: Diagnosis not present

## 2017-11-29 ENCOUNTER — Encounter: Payer: 59 | Admitting: Primary Care

## 2017-11-30 LAB — ESTROGENS, TOTAL: Estrogen: 729.5 pg/mL — ABNORMAL HIGH

## 2017-12-02 ENCOUNTER — Ambulatory Visit (INDEPENDENT_AMBULATORY_CARE_PROVIDER_SITE_OTHER): Payer: 59 | Admitting: Primary Care

## 2017-12-02 ENCOUNTER — Encounter: Payer: Self-pay | Admitting: Primary Care

## 2017-12-02 VITALS — BP 140/78 | HR 77 | Temp 98.3°F | Ht 63.75 in | Wt 143.8 lb

## 2017-12-02 DIAGNOSIS — E039 Hypothyroidism, unspecified: Secondary | ICD-10-CM

## 2017-12-02 DIAGNOSIS — Z0000 Encounter for general adult medical examination without abnormal findings: Secondary | ICD-10-CM | POA: Diagnosis not present

## 2017-12-02 DIAGNOSIS — G8929 Other chronic pain: Secondary | ICD-10-CM

## 2017-12-02 DIAGNOSIS — N3010 Interstitial cystitis (chronic) without hematuria: Secondary | ICD-10-CM | POA: Diagnosis not present

## 2017-12-02 DIAGNOSIS — J4540 Moderate persistent asthma, uncomplicated: Secondary | ICD-10-CM

## 2017-12-02 DIAGNOSIS — I10 Essential (primary) hypertension: Secondary | ICD-10-CM

## 2017-12-02 DIAGNOSIS — K219 Gastro-esophageal reflux disease without esophagitis: Secondary | ICD-10-CM

## 2017-12-02 DIAGNOSIS — R51 Headache: Secondary | ICD-10-CM

## 2017-12-02 DIAGNOSIS — M5010 Cervical disc disorder with radiculopathy, unspecified cervical region: Secondary | ICD-10-CM

## 2017-12-02 DIAGNOSIS — R519 Headache, unspecified: Secondary | ICD-10-CM

## 2017-12-02 DIAGNOSIS — F09 Unspecified mental disorder due to known physiological condition: Secondary | ICD-10-CM

## 2017-12-02 DIAGNOSIS — E2839 Other primary ovarian failure: Secondary | ICD-10-CM

## 2017-12-02 MED ORDER — BUDESONIDE 180 MCG/ACT IN AEPB: 2.0000 | INHALATION_SPRAY | Freq: Two times a day (BID) | RESPIRATORY_TRACT | 5 refills | Status: DC

## 2017-12-02 MED ORDER — BUDESONIDE-FORMOTEROL FUMARATE 160-4.5 MCG/ACT IN AERO: 2.0000 | INHALATION_SPRAY | Freq: Two times a day (BID) | RESPIRATORY_TRACT | 2 refills | Status: DC

## 2017-12-02 NOTE — Assessment & Plan Note (Signed)
Overall doing well. Stopped amlodipine yesterday due to ankle edema, taking for nutcracker esophagus.

## 2017-12-02 NOTE — Progress Notes (Signed)
Subjective:    Patient ID: Katie Robbins, female    DOB: 03/18/1956, 62 y.o.   MRN: [id number]  HPI  Katie Robbins is a 62 year old female who presents today for complete physical. She is following with cardiology, psychiatry, gynecology, Urology, GI.   She stopped her Norvasc this morning due to ankle edema. She is compliant to her olmesartan 20 mg BID. She will see her cardiologist tomorrow to discuss symptoms of exertional shortness of breath. She is compliant to her Pulmicort as prescribed, also using albuterol up to several times daily. She once tried Advair but didn't like the way it made her feel.   Immunizations: -Tetanus: Completed in 2016 -Influenza: Did not complete last season -Pneumonia: Completed in 2016   Diet: She endorses a healthy diet. Breakfast: Eggs, cereal, bagel or english muffin (jam, cream cheese) Lunch: Left overs, restaurants  Dinner: Meat, vegetable, starch Snacks: None Desserts: Nightly, small portions (candy, cookie, ice cream) Beverages: Water, small amount of Coke, alcohol several times weekly   Exercise: Active, no regular exercise routine. Colonoscopy: Completed in 2018 Pap Smear: Hysterectomy  Mammogram: Completed in August 2018. Follows with GYN.   BP Readings from Last 3 Encounters:  12/02/17 140/78  10/09/17 (!) 156/83  07/19/17 130/82      Review of Systems  Constitutional: Negative for unexpected weight change.  HENT: Negative for rhinorrhea.   Respiratory: Positive for shortness of breath. Negative for cough.   Cardiovascular: Negative for chest pain.  Gastrointestinal: Negative for constipation and diarrhea.  Genitourinary: Negative for difficulty urinating.  Musculoskeletal: Negative for arthralgias and myalgias.  Skin: Negative for rash.  Allergic/Immunologic: Negative for environmental allergies.  Neurological: Negative for dizziness, numbness and headaches.  Psychiatric/Behavioral: The patient is not nervous/anxious.        Past Medical History:  Diagnosis Date  . Anxiety   . Arthritis   . Asthma   . Chicken pox   . Chronic headaches   . Diverticulitis   . Essential hypertension   . GERD (gastroesophageal reflux disease)   . H/O breast implant   . Hypothyroidism   . Interstitial cystitis   . Mitral prolapse 1985  . Nutcracker esophagus      Social History   Socioeconomic History  . Marital status: Married    Spouse name: [name]  . Number of children: 2  . Years of education: Some college  . Highest education level: Not on file  Occupational History  . Occupation: Retired  Scientific laboratory technician  . Financial resource strain: Not on file  . Food insecurity:    Worry: Not on file    Inability: Not on file  . Transportation needs:    Medical: Not on file    Non-medical: Not on file  Tobacco Use  . Smoking status: Never Smoker  . Smokeless tobacco: Never Used  Substance and Sexual Activity  . Alcohol use: Yes    Comment: socially  . Drug use: No  . Sexual activity: Not on file  Lifestyle  . Physical activity:    Days per week: Not on file    Minutes per session: Not on file  . Stress: Not on file  Relationships  . Social connections:    Talks on phone: Not on file    Gets together: Not on file    Attends religious service: Not on file    Active member of club or organization: Not on file    Attends meetings of clubs or organizations: Not on  file    Relationship status: Not on file  . Intimate partner violence:    Fear of current or ex partner: Not on file    Emotionally abused: Not on file    Physically abused: Not on file    Forced sexual activity: Not on file  Other Topics Concern  . Not on file  Social History Narrative   Lives with husband   Caffeine use: rare   Right handed     Past Surgical History:  Procedure Laterality Date  . ABDOMINAL HYSTERECTOMY  2000/2009  . APPENDECTOMY  2007  . BOWEL RESECTION  2007  . CARDIAC CATHETERIZATION  2016   Clean  . CARPAL TUNNEL  RELEASE Right   . CHOLECYSTECTOMY  2012  . CHOLECYSTECTOMY  2012  . NECK SURGERY  06/11/2016   plates and screws in neck   . SMALL INTESTINE SURGERY  2007   Bowel resection  . TONSILLECTOMY    . VESICO-VAGINAL FISTULA REPAIR  2007    Family History  Problem Relation Age of Onset  . Hypertension Mother   . GER disease Mother   . Polymyalgia rheumatica Mother   . Arthritis Mother   . GER disease Father     Allergies  Allergen Reactions  . Bactrim [Sulfamethoxazole-Trimethoprim] Hives  . Macrobid [Nitrofurantoin Macrocrystal] Hives  . Tizanidine Other (See Comments)    Bad Mood changs  . Vicodin [Hydrocodone-Acetaminophen] Swelling    Puffy eyes and face  . Hydrocodone   . Nexium [Esomeprazole Magnesium]     Face swelling, hives   . Prilosec [Omeprazole]     Face swelling, hives   . Sulfa Antibiotics   . Erythromycin Rash    Current Outpatient Medications on File Prior to Visit  Medication Sig Dispense Refill  . amLODipine (NORVASC) 5 MG tablet Take 1 tablet by mouth once daily. 90 tablet 1  . amphetamine-dextroamphetamine (ADDERALL) 20 MG tablet Taking 1/2 to 1 tablet 4 times a day needed    . BD PEN NEEDLE NANO U/F 32G X 4 MM MISC USE ONE  4 TIMES DAILY WITH  INSULIN  PEN 300 each 1  . cyanocobalamin (,VITAMIN B-12,) 1000 MCG/ML injection Inject 1 ml into the muscle every 3-4 weeks. 14 mL 3  . diazepam (VALIUM) 5 MG tablet Take 5 mg by mouth every 6 (six) hours as needed.     Marland Kitchen estradiol (ESTRACE) 1 MG tablet Take 1 mg by mouth daily.    Marland Kitchen levothyroxine (SYNTHROID, LEVOTHROID) 112 MCG tablet Take 1 tablet (112 mcg total) by mouth daily. 90 tablet 3  . magic mouthwash SOLN Take 15 mLs by mouth 3 (three) times daily as needed for mouth pain (Modified Magic Mouthwash). 360 mL 0  . olmesartan (BENICAR) 20 MG tablet Take 1 tablet (20 mg total) by mouth 2 (two) times daily. 60 tablet 1  . pantoprazole (PROTONIX) 40 MG tablet Take 1 tablet (40 mg total) by mouth 2 (two) times  daily. 60 tablet 5  . albuterol (PROAIR HFA) 108 (90 Base) MCG/ACT inhaler Inhale 2 puffs into the lungs every 6 (six) hours as needed for wheezing or shortness of breath. (Patient not taking: Reported on 12/02/2017) 1 Inhaler 5   No current facility-administered medications on file prior to visit.     BP 140/78   Pulse 77   Temp 98.3 F (36.8 C) (Oral)   Ht 5' 3.75" (1.619 m)   Wt 143 lb 12 oz (65.2 kg)   SpO2 99%  BMI 24.87 kg/m    Objective:   Physical Exam  Constitutional: She is oriented to person, place, and time. She appears well-nourished.  HENT:  Mouth/Throat: No oropharyngeal exudate.  Eyes: Pupils are equal, round, and reactive to light. EOM are normal.  Neck: Neck supple. No thyromegaly present.  Cardiovascular: Normal rate and regular rhythm.  Respiratory: Effort normal and breath sounds normal.  GI: Soft. Bowel sounds are normal. There is no tenderness.  Musculoskeletal: Normal range of motion.  Neurological: She is alert and oriented to person, place, and time.  Skin: Skin is warm and dry.  Psychiatric: She has a normal mood and affect.           Assessment & Plan:

## 2017-12-02 NOTE — Assessment & Plan Note (Addendum)
Compliant to Pulmicort daily as prescribed, also using albuterol several times daily due to exertional SOB. She will be meeting with her cardiologist tomorrow, will have him rule out cardiac cause.   No wheezing on exam. Given excessive use of albuterol with temporary improvement, suspect she needs LABA/ICS combo. Rx for Symbicort sent to pharmacy.  She will update.

## 2017-12-02 NOTE — Assessment & Plan Note (Signed)
Recent TSH unremarkable. Continue levothyroxine 112 mcg.

## 2017-12-02 NOTE — Assessment & Plan Note (Signed)
Much improved since surgery. Doing well.

## 2017-12-02 NOTE — Assessment & Plan Note (Signed)
Overall stable. Following with neurology.

## 2017-12-02 NOTE — Patient Instructions (Addendum)
Stop budesonide (Pulmicort)  Start budesonide-formoterol (Symbicort). Inhale 2 puffs twice daily. Use the albuterol inhaler as needed as discussed.   Please update me regarding the new inhaler.   Please update me regarding your cardiology visit tomorrow.  Start exercising. You should be getting 150 minutes of exercise weekly.  Make sure to eat a healthy diet and get plenty of vegetables, fruit, whole grains, lean protein.  Ensure you are consuming 64 ounces of water daily.  Follow up in 1 year for your annual exam or sooner.  It was a pleasure to see you today!

## 2017-12-02 NOTE — Assessment & Plan Note (Addendum)
Slightly above goal today, she did stop her Amlodipine yesterday. She will be seeing her cardiologist tomorrow.  BMP unremarkable.

## 2017-12-02 NOTE — Assessment & Plan Note (Signed)
Patient requested for estrogen level to be checked which are around low 700's. She will discuss her fatigue and hot flashes with her GYN. Recommended she try to reduce her estradiol dose as she may be getting too much.

## 2017-12-02 NOTE — Assessment & Plan Note (Signed)
Following with Urology, overall stable.

## 2017-12-02 NOTE — Assessment & Plan Note (Signed)
Doing well on Adderall, tries to take 2 tablets daily rather than three. Following with psychiatry.

## 2017-12-02 NOTE — Assessment & Plan Note (Signed)
Immunizations UTD. Mammogram due this August, she will schedule through GYN. Colonoscopy UTD. Overall fair diet, recommended regular exercise. Exam unremarkable. Labs reviewed.  Follow up in 1 year for CPE.

## 2017-12-04 DIAGNOSIS — M62838 Other muscle spasm: Secondary | ICD-10-CM | POA: Diagnosis not present

## 2017-12-04 DIAGNOSIS — R102 Pelvic and perineal pain: Secondary | ICD-10-CM | POA: Diagnosis not present

## 2017-12-04 DIAGNOSIS — R1030 Lower abdominal pain, unspecified: Secondary | ICD-10-CM | POA: Diagnosis not present

## 2017-12-09 ENCOUNTER — Other Ambulatory Visit: Payer: Self-pay | Admitting: Primary Care

## 2017-12-09 DIAGNOSIS — I10 Essential (primary) hypertension: Secondary | ICD-10-CM

## 2017-12-10 ENCOUNTER — Telehealth: Payer: Self-pay | Admitting: Neurology

## 2017-12-10 DIAGNOSIS — R1030 Lower abdominal pain, unspecified: Secondary | ICD-10-CM | POA: Diagnosis not present

## 2017-12-10 DIAGNOSIS — M62838 Other muscle spasm: Secondary | ICD-10-CM | POA: Diagnosis not present

## 2017-12-10 DIAGNOSIS — M6281 Muscle weakness (generalized): Secondary | ICD-10-CM | POA: Diagnosis not present

## 2017-12-10 NOTE — Telephone Encounter (Signed)
I have received some medical records regarding this patient.  The patient had a neuropsychological evaluation done on 24 June 2013, that showed no abnormal findings, cognitive capacity was normal.  MRI of the cervical spine done on 22 May 2013 showed multilevel facet joint arthritis with mild central stenosis at C6-7 level, no spinal cord compression was noted.  Moderate right foraminal stenosis at the C3-4 level, mild to moderate foraminal narrowing is noted at the C6-7 level.    MRI of the brain was done on 05 June 2013.  This showed mild small vessel ischemic changes, otherwise unremarkable.  The patient had a EMG nerve conduction study done on 01 June 2013, this showed a subacute left C6 and C8 radiculopathy.

## 2017-12-11 ENCOUNTER — Other Ambulatory Visit: Payer: Self-pay | Admitting: Primary Care

## 2017-12-11 DIAGNOSIS — E039 Hypothyroidism, unspecified: Secondary | ICD-10-CM

## 2017-12-24 DIAGNOSIS — I10 Essential (primary) hypertension: Secondary | ICD-10-CM | POA: Diagnosis not present

## 2017-12-24 DIAGNOSIS — R0602 Shortness of breath: Secondary | ICD-10-CM | POA: Diagnosis not present

## 2017-12-25 DIAGNOSIS — M6281 Muscle weakness (generalized): Secondary | ICD-10-CM | POA: Diagnosis not present

## 2017-12-25 DIAGNOSIS — M62838 Other muscle spasm: Secondary | ICD-10-CM | POA: Diagnosis not present

## 2017-12-25 DIAGNOSIS — R1030 Lower abdominal pain, unspecified: Secondary | ICD-10-CM | POA: Diagnosis not present

## 2018-01-01 DIAGNOSIS — N3010 Interstitial cystitis (chronic) without hematuria: Secondary | ICD-10-CM | POA: Diagnosis not present

## 2018-01-02 DIAGNOSIS — R1030 Lower abdominal pain, unspecified: Secondary | ICD-10-CM | POA: Diagnosis not present

## 2018-01-02 DIAGNOSIS — N3010 Interstitial cystitis (chronic) without hematuria: Secondary | ICD-10-CM | POA: Diagnosis not present

## 2018-01-03 ENCOUNTER — Other Ambulatory Visit: Payer: Self-pay | Admitting: Primary Care

## 2018-01-03 DIAGNOSIS — I10 Essential (primary) hypertension: Secondary | ICD-10-CM

## 2018-01-06 ENCOUNTER — Telehealth: Payer: Self-pay | Admitting: Primary Care

## 2018-01-06 NOTE — Telephone Encounter (Signed)
Last prescribed on 08/28/2017 Last office visit on 12/02/2017. Will fax order.

## 2018-01-06 NOTE — Telephone Encounter (Signed)
Pt is requesting refill on 360 mouthwash. She's wondering if she can get 2 refills

## 2018-01-07 ENCOUNTER — Ambulatory Visit: Payer: 59 | Admitting: Rheumatology

## 2018-01-07 NOTE — Telephone Encounter (Signed)
Okay to print and fax.

## 2018-01-08 MED ORDER — MAGIC MOUTHWASH: 15.0000 mL | Freq: Three times a day (TID) | ORAL | 0 refills | Status: DC | PRN

## 2018-01-08 NOTE — Telephone Encounter (Signed)
Done

## 2018-01-14 ENCOUNTER — Emergency Department (HOSPITAL_COMMUNITY)
Admission: EM | Admit: 2018-01-14 | Discharge: 2018-01-14 | Disposition: A | Discharge status: Home / Self Care | Payer: 59 | Attending: Emergency Medicine | Admitting: Emergency Medicine

## 2018-01-14 ENCOUNTER — Encounter (HOSPITAL_COMMUNITY): Payer: Self-pay | Admitting: *Deleted

## 2018-01-14 ENCOUNTER — Telehealth: Payer: Self-pay

## 2018-01-14 ENCOUNTER — Emergency Department (HOSPITAL_COMMUNITY): Payer: 59

## 2018-01-14 DIAGNOSIS — Z79899 Other long term (current) drug therapy: Secondary | ICD-10-CM | POA: Diagnosis not present

## 2018-01-14 DIAGNOSIS — J45909 Unspecified asthma, uncomplicated: Secondary | ICD-10-CM | POA: Diagnosis not present

## 2018-01-14 DIAGNOSIS — E039 Hypothyroidism, unspecified: Secondary | ICD-10-CM | POA: Insufficient documentation

## 2018-01-14 DIAGNOSIS — R0602 Shortness of breath: Secondary | ICD-10-CM | POA: Insufficient documentation

## 2018-01-14 DIAGNOSIS — I10 Essential (primary) hypertension: Secondary | ICD-10-CM | POA: Insufficient documentation

## 2018-01-14 DIAGNOSIS — Z8709 Personal history of other diseases of the respiratory system: Secondary | ICD-10-CM

## 2018-01-14 DIAGNOSIS — R079 Chest pain, unspecified: Secondary | ICD-10-CM | POA: Diagnosis present

## 2018-01-14 LAB — CBC
HCT: 37.6 % (ref 36.0–46.0)
Hemoglobin: 12.1 g/dL (ref 12.0–15.0)
MCH: 30.5 pg (ref 26.0–34.0)
MCHC: 32.2 g/dL (ref 30.0–36.0)
MCV: 94.7 fL (ref 78.0–100.0)
Platelets: 220 10*3/uL (ref 150–400)
RBC: 3.97 MIL/uL (ref 3.87–5.11)
RDW: 13 % (ref 11.5–15.5)
WBC: 5 10*3/uL (ref 4.0–10.5)

## 2018-01-14 LAB — BRAIN NATRIURETIC PEPTIDE: B Natriuretic Peptide: 25.3 pg/mL (ref 0.0–100.0)

## 2018-01-14 LAB — BASIC METABOLIC PANEL
Anion gap: 10 (ref 5–15)
BUN: 13 mg/dL (ref 8–23)
CO2: 24 mmol/L (ref 22–32)
Calcium: 8.9 mg/dL (ref 8.9–10.3)
Chloride: 104 mmol/L (ref 98–111)
Creatinine, Ser: 1.13 mg/dL — ABNORMAL HIGH (ref 0.44–1.00)
GFR calc Af Amer: 59 mL/min — ABNORMAL LOW (ref >60)
GFR calc non Af Amer: 51 mL/min — ABNORMAL LOW (ref >60)
Glucose, Bld: 106 mg/dL — ABNORMAL HIGH (ref 70–99)
Potassium: 4.1 mmol/L (ref 3.5–5.1)
Sodium: 138 mmol/L (ref 135–145)

## 2018-01-14 LAB — I-STAT TROPONIN, ED: Troponin i, poc: 0.01 ng/mL (ref 0.00–0.08)

## 2018-01-14 LAB — D-DIMER, QUANTITATIVE: D-Dimer, Quant: 0.42 ug/mL-FEU (ref 0.00–0.50)

## 2018-01-14 LAB — SEDIMENTATION RATE: Sed Rate: 2 mm/hr (ref 0–22)

## 2018-01-14 MED ORDER — IPRATROPIUM-ALBUTEROL 0.5-2.5 (3) MG/3ML IN SOLN
3.0000 mL | Freq: Once | RESPIRATORY_TRACT | Status: AC
  Administered 2018-01-14: 3 mL via RESPIRATORY_TRACT
  Filled 2018-01-14: qty 3

## 2018-01-14 MED ORDER — ASPIRIN 81 MG PO CHEW: 324.0000 mg | CHEWABLE_TABLET | Freq: Once | ORAL | Status: DC

## 2018-01-14 NOTE — Telephone Encounter (Signed)
I spoke with pt; pt did not go to UC or ED; pt did not call for appt; pt is laying in bed; pt said when last seen the Norvasc was d/c.pt last seen 12/02/17.  BP this morning was 169/99; pt took Norvasc 5 mg and Valium 5 mg this morning at 9:00AM and now BP 172/97. No CP but does have H/A and SOB and dizziness and blurred vision. Pt is not sure if BP cuff is calibrated. Pt said stress test pt had was fine. Pt is not sure what is causing the symptoms and elevated BP. Advised pt should go to ED or UC for eval. Pt said she is not sure if she should take Adderall which was prescribed by psychiatrist because she does not want to crash. Advised pt to ck with psychiatrist about taking Adderall. Pt said she will go to [facility] ED, her husband will take her. FYI to Gentry Fitz NP.

## 2018-01-14 NOTE — ED Notes (Signed)
Pt left without receiving discharge papers.

## 2018-01-14 NOTE — ED Triage Notes (Signed)
Pt in c/o chest pain, shortness of breath, numbness to her lips, bilateral hands and feet- states that has been going on for months, had a stress test a few weeks ago that was negative but patient states she just keeps getting worse

## 2018-01-14 NOTE — ED Provider Notes (Signed)
[facility] EMERGENCY DEPARTMENT Provider Note   CSN: [id number] Arrival date & time: 01/14/18  1115     History   Chief Complaint Chief Complaint  Patient presents with  . Chest Pain    HPI Katie Robbins is a 62 y.o. female.  HPI Patient had stress test at [facility] [name] end of July 2019, reportedly normal. Last ECHO done in [place] 6 years ago per patient.  Patient reports that she has had a long-standing history of asthma.  This was initially exercise-induced asthma.  She is a non-smoker.  He has been taking inhalers as prescribed.  There have been adjustments made without improvement.  Patient does not have fevers or productive cough.  She reports that her shortness of breath can be at rest or with activity.  This just progressively keeps getting slightly worse. Past Medical History:  Diagnosis Date  . Anxiety   . Arthritis   . Asthma   . Chicken pox   . Chronic headaches   . Diverticulitis   . Essential hypertension   . GERD (gastroesophageal reflux disease)   . H/O breast implant   . Hypothyroidism   . Interstitial cystitis   . Mitral prolapse 1985  . Nutcracker esophagus     Patient Active Problem List   Diagnosis Date Noted  . Primary osteoarthritis of left knee 05/29/2017  . S/P carpal tunnel release 05/29/2017  . Primary osteoarthritis of both hands 05/29/2017  . History of bilateral breast implants 05/29/2017  . Interstitial cystitis 05/29/2017  . History of mitral valve prolapse 05/29/2017  . History of diverticulitis 05/29/2017  . Cervical disc disorder with radiculopathy of cervical region 04/12/2016  . Preventative health care 11/28/2015  . Esophageal reflux 08/19/2015  . Essential hypertension 08/19/2015  . Hypothyroidism 08/19/2015  . Asthma 08/19/2015  . Cognitive disorder 08/19/2015  . Chronic headaches 08/19/2015  . Estrogen deficiency 08/19/2015    Past Surgical History:  Procedure Laterality Date  . ABDOMINAL  HYSTERECTOMY  2000/2009  . APPENDECTOMY  2007  . BOWEL RESECTION  2007  . CARDIAC CATHETERIZATION  2016   Clean  . CARPAL TUNNEL RELEASE Right   . CHOLECYSTECTOMY  2012  . CHOLECYSTECTOMY  2012  . NECK SURGERY  06/11/2016   plates and screws in neck   . SMALL INTESTINE SURGERY  2007   Bowel resection  . TONSILLECTOMY    . [place] FISTULA REPAIR  2007     OB History   None      Home Medications    Prior to Admission medications   Medication Sig Start Date End Date Taking? Authorizing Provider  albuterol (PROAIR HFA) 108 (90 Base) MCG/ACT inhaler Inhale 2 puffs into the lungs every 6 (six) hours as needed for wheezing or shortness of breath. Patient not taking: Reported on 12/02/2017 04/17/17   Pleas Koch, NP  amLODipine (NORVASC) 5 MG tablet Take 1 tablet by mouth once daily. 09/06/17   Pleas Koch, NP  amphetamine-dextroamphetamine (ADDERALL) 20 MG tablet Taking 1/2 to 1 tablet 4 times a day needed 07/28/15   [provider]  BD PEN NEEDLE NANO U/F 32G X 4 MM MISC USE ONE  4 TIMES DAILY WITH  INSULIN  PEN 04/08/17   Pleas Koch, NP  budesonide-formoterol [facility]) 160-4.5 MCG/ACT inhaler Inhale 2 puffs into the lungs 2 (two) times daily. 12/02/17   Pleas Koch, NP  cyanocobalamin (,VITAMIN B-12,) 1000 MCG/ML injection Inject 1 ml into the muscle  every 3-4 weeks. 11/28/16   Pleas Koch, NP  diazepam (VALIUM) 5 MG tablet Take 5 mg by mouth every 6 (six) hours as needed.  07/11/16   [provider]  estradiol (ESTRACE) 1 MG tablet Take 1 mg by mouth daily.    [provider]  levothyroxine (SYNTHROID, LEVOTHROID) 112 MCG tablet TAKE 1 TABLET BY MOUTH ONCE A DAY 12/11/17   Pleas Koch, NP  magic mouthwash SOLN Take 15 mLs by mouth 3 (three) times daily as needed for mouth pain (Modified Magic Mouthwash). 01/08/18   Pleas Koch, NP  olmesartan (BENICAR) 20 MG tablet TAKE 1 TABLET BY MOUTH TWICE A DAY 12/10/17    Pleas Koch, NP  pantoprazole (PROTONIX) 40 MG tablet Take 1 tablet (40 mg total) by mouth 2 (two) times daily. 07/18/17   Pleas Koch, NP    Family History Family History  Problem Relation Age of Onset  . Hypertension Mother   . GER disease Mother   . Polymyalgia rheumatica Mother   . Arthritis Mother   . GER disease Father     Social History Social History   Tobacco Use  . Smoking status: Never Smoker  . Smokeless tobacco: Never Used  Substance Use Topics  . Alcohol use: Yes    Comment: socially  . Drug use: No     Allergies   Bactrim [sulfamethoxazole-trimethoprim]; Macrobid [nitrofurantoin macrocrystal]; Tizanidine; Vicodin [hydrocodone-acetaminophen]; Hydrocodone; Nexium [esomeprazole magnesium]; Prilosec [omeprazole]; Sulfa antibiotics; and Erythromycin   Review of Systems Review of Systems 10 Systems reviewed and are negative for acute change except as noted in the HPI.   Physical Exam Updated Vital Signs BP (!) 160/85   Pulse 73   Temp 97.9 F (36.6 C) (Oral)   Resp 13   Ht 5\' 3"  (1.6 m)   Wt 62.6 kg   SpO2 99% Comment: 98-100% on room air while ambulating.   BMI 24.45 kg/m   Physical Exam  Constitutional: She is oriented to person, place, and time.  Patient is clinically well in appearance.  She is nontoxic and alert.  No respiratory distress.  Color is good.  HENT:  Head: Normocephalic and atraumatic.  Mouth/Throat: Oropharynx is clear and moist.  Eyes: EOM are normal.  Neck: Neck supple.  Cardiovascular: Normal rate, regular rhythm, normal heart sounds and intact distal pulses.  Pulmonary/Chest: Effort normal and breath sounds normal.  Abdominal: Soft. She exhibits no distension. There is no tenderness. There is no guarding.  Musculoskeletal: Normal range of motion. She exhibits no edema or tenderness.  Neurological: She is alert and oriented to person, place, and time. No cranial nerve deficit. She exhibits normal muscle tone.  Coordination normal.  Skin: Skin is warm and dry.  Psychiatric: She has a normal mood and affect.     ED Treatments / Results  Labs (all labs ordered are listed, but only abnormal results are displayed) Labs Reviewed  BASIC METABOLIC PANEL - Abnormal; Notable for the following components:      Result Value   Glucose, Bld 106 (*)    Creatinine, Ser 1.13 (*)    GFR calc non Af Amer 51 (*)    GFR calc Af Amer 59 (*)    All other components within normal limits  CBC  D-DIMER, QUANTITATIVE (NOT AT [facility])  BRAIN NATRIURETIC PEPTIDE  SEDIMENTATION RATE  I-STAT TROPONIN, ED    EKG EKG Interpretation  Date/Time:  Tuesday January 14 2018 11:23:20 EDT Ventricular Rate:  81  PR Interval:  144 QRS Duration: 90 QT Interval:  416 QTC Calculation: 483 R Axis:   33 Text Interpretation:  Normal sinus rhythm Normal ECG agree. no STEMI. Confirmed by Charlesetta Shanks [id number]) on 01/14/2018 1:03:39 PM   Radiology Dg Chest 2 View  Result Date: 01/14/2018 CLINICAL DATA:  Chest pain EXAM: CHEST - 2 VIEW COMPARISON:  03/09/2016 chest radiograph. FINDINGS: Partially visualized surgical hardware from ACDF overlying the lower cervical spine. Cholecystectomy clips are seen in the right upper quadrant of the abdomen. Stable cardiomediastinal silhouette with normal heart size. No pneumothorax. No pleural effusion. Lungs appear clear, with no acute consolidative airspace disease and no pulmonary edema. IMPRESSION: No active cardiopulmonary disease. Electronically Signed   By: Ilona Sorrel M.D.   On: 01/14/2018 11:40    Procedures Procedures (including critical care time)  Medications Ordered in ED Medications  ipratropium-albuterol (DUONEB) 0.5-2.5 (3) MG/3ML nebulizer solution 3 mL (3 mLs Nebulization Given 01/14/18 1425)     Initial Impression / Assessment and Plan / ED Course  I have reviewed the triage vital signs and the nursing notes.  Pertinent labs & imaging results that were available during  my care of the patient were reviewed by me and considered in my medical decision making (see chart for details).      Final Clinical Impressions(s) / ED Diagnoses   Final diagnoses:  Shortness of breath  History of asthma   Patient is clinically well in appearance.  She has had years to months of problems with shortness of breath.  She has perceived over the past several months that things have progressively gotten worse.  She has been getting diagnostic evaluation including, per her report, a cardiac stress test in July by her cardiologist.  Today, there are no signs of hypoxia or respiratory distress.  Patient's lung fields are clear without any wheezing.  D-dimer is below cut off without acute risk factors for PE.  No signs of congestive heart failure.  Signs of active bronchospasm.  It is unclear whether the patient has persistent shortness of breath.  I do feel she is stable for continued outpatient management and referral to pulmonology by her PCP if indicated. ED Discharge Orders    None       Charlesetta Shanks, MD 01/14/18 [id number]

## 2018-01-14 NOTE — Telephone Encounter (Signed)
Noted, patient currently at the emergency department. Will await ED notes.

## 2018-01-14 NOTE — Telephone Encounter (Signed)
PLEASE NOTE: All timestamps contained within this report are represented as [place] Standard Time. CONFIDENTIALTY NOTICE: This fax transmission is intended only for the addressee. It contains information that is legally privileged, confidential or otherwise protected from use or disclosure. If you are not the intended recipient, you are strictly prohibited from reviewing, disclosing, copying using or disseminating any of this information or taking any action in reliance on or regarding this information. If you have received this fax in error, please notify us immediately by telephone so that we can arrange for its return to [place]. Phone: [phone], Toll-Free: [phone], Fax: [phone] Page: 1 of 2 Call Id: [id number] [place] Patient Name: Katie Robbins Gender: Female DOB: 1955-10-28 Age: 62 Y 3 M 14 D Return Phone Number: [phone] (Primary) Address[place] [address] Client [place] Night - Client Client Site [facility] - Night Physician [name] - NP Contact Type Call Who Is Calling Patient / Member / Family / Caregiver Call Type Triage / Clinical Relationship To Patient Self Return Phone Number [phone] (Primary) Chief Complaint BREATHING - shortness of breath or sounds breathless Reason for Call Symptomatic / Request for [place] stated their blood pressure is around 169/100 on the top and they are currently out of breath. The caller stated they do not want to go to the ER. [name] stated they took baby asprin and their blood pressure medication at 6pm Translation No Nurse Assessment Nurse: Lovena Le, RN, Truman Hayward Date/Time (Eastern Time): 01/13/2018 9:16:56 PM Confirm and document reason for call. If symptomatic, describe symptoms. ---[name] stated their blood pressure is  around 169/100 on the top and they are currently out of breath. The caller stated they do not want to go to the ER. [name] stated they took baby aspirin and their blood pressure medication benacar at 6pm Does the patient have any new or worsening symptoms? ---Yes Will a triage be completed? ---Yes Related visit to physician within the last 2 weeks? ---No Does the PT have any chronic conditions? (i.e. diabetes, asthma, this includes High risk factors for pregnancy, etc.) ---Yes List chronic conditions. ---HTN stress test was fine thyroid hormone, estradiol Is this a behavioral health or substance abuse call? ---No Guidelines Guideline Title Affirmed Question Affirmed Notes Nurse Date/Time (Eastern Time) High Blood Pressure [2] Systolic BP >= 947 OR Diastolic >= 654 AND [6] cardiac or neurologic symptoms (e.g., chest pain, difficulty breathing, unsteady gait, blurred vision) Margarita Sermons 01/13/2018 9:19:07 PM PLEASE NOTE: All timestamps contained within this report are represented as [place] Standard Time. CONFIDENTIALTY NOTICE: This fax transmission is intended only for the addressee. It contains information that is legally privileged, confidential or otherwise protected from use or disclosure. If you are not the intended recipient, you are strictly prohibited from reviewing, disclosing, copying using or disseminating any of this information or taking any action in reliance on or regarding this information. If you have received this fax in error, please notify us immediately by telephone so that we can arrange for its return to [place]. Phone: [phone], Toll-Free: [phone], Fax: [phone] Page: 2 of 2 Call Id: [id number] [place]. Time Eilene Ghazi Time) Disposition Final User 01/13/2018 9:13:51 PM Send to Urgent Sunny Schlein 01/13/2018 9:25:34 PM Go to ED Now Yes Lovena Le, RN, Windell Hummingbird Disagree/Comply Disagree [name] Understands Yes PreDisposition Call Doctor Care Advice Given  Per Guideline GO TO ED NOW: *  You need to be seen in the Emergency Department. * Go to the ED at ___________ Hospital. * Another adult should drive. CARE ADVICE given per High Blood Pressure (Adult) guideline. Comments User: Alger Simons, RN Date/Time Eilene Ghazi Time): 01/13/2018 9:25:29 PM caller started out by saying she was not going to the ED and nurse triaged her anyway. She continued to say she was not going. Referrals GO TO FACILITY REFUSED GO TO FACILITY REFUSED

## 2018-01-14 NOTE — Discharge Instructions (Signed)
1.  Continue your medications as prescribed by your doctor. 2.  Discussed referral to a pulmonologist with your family doctor as you continue to have shortness of breath without identifiable cause at this time.

## 2018-01-15 ENCOUNTER — Telehealth: Payer: Self-pay | Admitting: Primary Care

## 2018-01-15 NOTE — Telephone Encounter (Signed)
Spoken to patient and stated that she is still SOB and numbness in her arm. She went in detail about her ED visit. Her BP has been 171/90 this morning. Did offer an earlier appt on Tuesday 01/20/2018 but patient wanted to come Thursday 01/23/2018 instead.

## 2018-01-15 NOTE — Telephone Encounter (Signed)
Katie Robbins, will you please call patient to figure out what's going on? Please schedule her at her convenience for ED and BP follow up. Looks like her BP is quite high.

## 2018-01-15 NOTE — Telephone Encounter (Signed)
I did not speak directly to the pt as she hung up while I was checking with Rena at the [facility] office what they wanted to do.   The pt was seen in the ED yesterday for the shortness of breath.   She called in again today c/o of shortness of breath per the agent, Norma.   The pt is only wanting to see Allie Bossier, AGNP-C however there are no appts available with her today.    Norma transferred to call to me and told me the situation.   Pt was on hold with Constance Holster while I was checking with Mearl Latin because she spoke to the pt yesterday regarding this same issue.    In the mean time the pt hung up.  [facility] flow coordinator said Mearl Latin was busy at the moment and to send a note over and they would let Anda Kraft decide what course of action to take.  I routed this note to [facility] office for further disposition.

## 2018-01-15 NOTE — Telephone Encounter (Signed)
Patient would like to be called by 10:30 because she has to leave for physical therapy.

## 2018-01-15 NOTE — Telephone Encounter (Signed)
Patient calling back to speak with someone at the practice. On her way home from PT, ok to call after 1pm.

## 2018-01-15 NOTE — Telephone Encounter (Signed)
Noted, will evaluate on 01/23/18.

## 2018-01-15 NOTE — Telephone Encounter (Signed)
Unable to reach pt by phone.

## 2018-01-20 ENCOUNTER — Telehealth: Payer: Self-pay | Admitting: Primary Care

## 2018-01-20 NOTE — Telephone Encounter (Signed)
Pt dropped off information she wants Katie Robbins to read prior to 9/12 appt. Placed in rx tower

## 2018-01-21 ENCOUNTER — Ambulatory Visit: Payer: 59 | Admitting: Primary Care

## 2018-01-21 NOTE — Telephone Encounter (Signed)
Notified Allie Bossier and she has reviewed papers.

## 2018-01-23 ENCOUNTER — Encounter: Payer: Self-pay | Admitting: Primary Care

## 2018-01-23 ENCOUNTER — Ambulatory Visit: Payer: 59 | Admitting: Primary Care

## 2018-01-23 VITALS — BP 128/64 | HR 93 | Temp 98.2°F | Resp 14 | Ht 63.0 in | Wt 140.2 lb

## 2018-01-23 DIAGNOSIS — E039 Hypothyroidism, unspecified: Secondary | ICD-10-CM

## 2018-01-23 DIAGNOSIS — I10 Essential (primary) hypertension: Secondary | ICD-10-CM

## 2018-01-23 DIAGNOSIS — R5382 Chronic fatigue, unspecified: Secondary | ICD-10-CM

## 2018-01-23 DIAGNOSIS — J4540 Moderate persistent asthma, uncomplicated: Secondary | ICD-10-CM

## 2018-01-23 LAB — VITAMIN B12: Vitamin B-12: 729 pg/mL (ref 211–911)

## 2018-01-23 LAB — VITAMIN D 25 HYDROXY (VIT D DEFICIENCY, FRACTURES): VITD: 47.01 ng/mL (ref 30.00–100.00)

## 2018-01-23 LAB — TSH: TSH: 1.11 u[IU]/mL (ref 0.35–4.50)

## 2018-01-23 NOTE — Patient Instructions (Addendum)
You will be contacted regarding your referral to pulmonology for lung testing and the thyroid ultrasound.  Please let us know if you have not been contacted within one week.   Continue using your budesonide-formoterol (Symbicort) inhaler twice daily as prescribed.   Take your levothyroxine (Synthroid) every morning with water. No food or other medications for one hour. Do not take your pantoprazole (Protonix) within four hours of taking levothyroxine (Synthroid).   Stop by the lab prior to leaving today. I will notify you of your results once received.   It was a pleasure to see you today!

## 2018-01-23 NOTE — Progress Notes (Signed)
Subjective:    Patient ID: Katie Robbins, female    DOB: Aug 09, 1955, 62 y.o.   MRN: [id number]  HPI  [name] is a 61 year old female who presents today for follow up and a chief complaint of chronic fatigue.  1) Essential Hypertension: Currently managed on olmesartan 20 mg and amlodipine 5 mg. She was last evaluated by her cardiologist in mid August 2019 and underwent exercise stress echo with doppler. Her tests showed normal ejection fraction without ischemia and wall motion abnormality. ECG in July 2019 with possible left atrial enlargement, otherwise normal, no t-wave inversion.   BP Readings from Last 3 Encounters:  01/23/18 128/64  01/14/18 (!) 160/85  12/02/17 140/78   She denies chest pain.   2) Chronic Fatigue: Goes to bed around 9 pm, gets up around 7 am most mornings, sometimes will lay in bed until 11 am. She has started to reduce her Adderall and is mostly taking three times daily, sometimes four times daily. She does snore some nights, sometimes wakes during the night but this is due to numbness to her feet and arms.   She is convinced that her thyroid is causing these symptoms. She is currently managed on levothyroxine 112 mcg daily. TSH from July at 3.22. She takes her levothyroxine in the morning along with her estradiol, pantoprazole, and benicar. She takes with water, will eat breakfast 30-60 minutes later. She does have trouble swallowing, she has a history of nutcracker esophagus and GERD.  3) Shortness Of Breath: Compliant to Symbicort twice daily as prescribed, using albuterol twice daily everyday. She feels better on Symbicort when compared to Pulmicort but continues with shortness of breath at rest, light household chores, walking short distances. She denies wheezing. She's never tried Singulair for asthma symptoms. No recent pulmonary function testing. She does not have a pulmonologist.  Evaluated at [facility] on 09/03 with complaints of chest pain and shortness of  breath. She underwent chest xray and labs including d-dimer/troponin which was unremarkable. She was discharged home with recommendations for PCP and pulmonology follow up.  Review of Systems  Constitutional: Positive for fatigue. Negative for fever.  HENT: Negative for congestion.   Eyes: Negative for visual disturbance.  Respiratory: Positive for shortness of breath. Negative for cough and wheezing.   Cardiovascular: Negative for chest pain.  Neurological: Negative for dizziness and headaches.       Past Medical History:  Diagnosis Date  . Anxiety   . Arthritis   . Asthma   . Chicken pox   . Chronic headaches   . Diverticulitis   . Essential hypertension   . GERD (gastroesophageal reflux disease)   . H/O breast implant   . Hypothyroidism   . Interstitial cystitis   . Mitral prolapse 1985  . Nutcracker esophagus      Social History   Socioeconomic History  . Marital status: Married    Spouse name: Katie Robbins  . Number of children: 2  . Years of education: Some college  . Highest education level: Not on file  Occupational History  . Occupation: Retired  Scientific laboratory technician  . Financial resource strain: Not on file  . Food insecurity:    Worry: Not on file    Inability: Not on file  . Transportation needs:    Medical: Not on file    Non-medical: Not on file  Tobacco Use  . Smoking status: Never Smoker  . Smokeless tobacco: Never Used  Substance and Sexual Activity  . Alcohol  use: Yes    Comment: socially  . Drug use: No  . Sexual activity: Not on file  Lifestyle  . Physical activity:    Days per week: Not on file    Minutes per session: Not on file  . Stress: Not on file  Relationships  . Social connections:    Talks on phone: Not on file    Gets together: Not on file    Attends religious service: Not on file    Active member of club or organization: Not on file    Attends meetings of clubs or organizations: Not on file    Relationship status: Not on file  .  Intimate partner violence:    Fear of current or ex partner: Not on file    Emotionally abused: Not on file    Physically abused: Not on file    Forced sexual activity: Not on file  Other Topics Concern  . Not on file  Social History Narrative   Lives with husband   Caffeine use: rare   Right handed     Past Surgical History:  Procedure Laterality Date  . ABDOMINAL HYSTERECTOMY  2000/2009  . APPENDECTOMY  2007  . BOWEL RESECTION  2007  . CARDIAC CATHETERIZATION  2016   Clean  . CARPAL TUNNEL RELEASE Right   . CHOLECYSTECTOMY  2012  . CHOLECYSTECTOMY  2012  . NECK SURGERY  06/11/2016   plates and screws in neck   . SMALL INTESTINE SURGERY  2007   Bowel resection  . TONSILLECTOMY    . VESICO-VAGINAL FISTULA REPAIR  2007    Family History  Problem Relation Age of Onset  . Hypertension Mother   . GER disease Mother   . Polymyalgia rheumatica Mother   . Arthritis Mother   . GER disease Father     Allergies  Allergen Reactions  . Bactrim [Sulfamethoxazole-Trimethoprim] Hives  . Macrobid [Nitrofurantoin Macrocrystal] Hives  . Tizanidine Other (See Comments)    Bad Mood changs  . Vicodin [Hydrocodone-Acetaminophen] Swelling    Puffy eyes and face  . Hydrocodone   . Nexium [Esomeprazole Magnesium]     Face swelling, hives   . Prilosec [Omeprazole]     Face swelling, hives   . Sulfa Antibiotics   . Erythromycin Rash    Current Outpatient Medications on File Prior to Visit  Medication Sig Dispense Refill  . albuterol (PROAIR HFA) 108 (90 Base) MCG/ACT inhaler Inhale 2 puffs into the lungs every 6 (six) hours as needed for wheezing or shortness of breath. 1 Inhaler 5  . amLODipine (NORVASC) 5 MG tablet Take 1 tablet by mouth once daily. 90 tablet 1  . amphetamine-dextroamphetamine (ADDERALL) 20 MG tablet Taking 1/2 to 1 tablet 4 times a day needed    . BD PEN NEEDLE NANO U/F 32G X 4 MM MISC USE ONE  4 TIMES DAILY WITH  INSULIN  PEN 300 each 1  .  budesonide-formoterol (SYMBICORT) 160-4.5 MCG/ACT inhaler Inhale 2 puffs into the lungs 2 (two) times daily. 1 Inhaler 2  . cyanocobalamin (,VITAMIN B-12,) 1000 MCG/ML injection Inject 1 ml into the muscle every 3-4 weeks. 14 mL 3  . diazepam (VALIUM) 5 MG tablet Take 5 mg by mouth every 6 (six) hours as needed.     Marland Kitchen estradiol (ESTRACE) 1 MG tablet Take 1 mg by mouth daily.    Marland Kitchen levothyroxine (SYNTHROID, LEVOTHROID) 112 MCG tablet TAKE 1 TABLET BY MOUTH ONCE A DAY 90 tablet 3  .  magic mouthwash SOLN Take 15 mLs by mouth 3 (three) times daily as needed for mouth pain (Modified Magic Mouthwash). 360 mL 0  . olmesartan (BENICAR) 20 MG tablet TAKE 1 TABLET BY MOUTH TWICE A DAY 60 tablet 1  . pantoprazole (PROTONIX) 40 MG tablet Take 1 tablet (40 mg total) by mouth 2 (two) times daily. 60 tablet 5   No current facility-administered medications on file prior to visit.     BP 128/64   Pulse 93   Temp 98.2 F (36.8 C)   Resp 14   Ht 5\' 3"  (1.6 m)   Wt 140 lb 4 oz (63.6 kg)   SpO2 99%   BMI 24.84 kg/m    Objective:   Physical Exam  Constitutional: She is oriented to person, place, and time. She appears well-nourished.  Neck: Neck supple.  Cardiovascular: Normal rate and regular rhythm.  Respiratory: Effort normal and breath sounds normal. She has no wheezes.  Neurological: She is alert and oriented to person, place, and time.  Skin: Skin is warm and dry.           Assessment & Plan:

## 2018-01-23 NOTE — Assessment & Plan Note (Addendum)
Overall feels better on Symbicort, but continued shortness of breath with low exertion and rest. Exam today without wheezing. Cardiac work up unremarkable.   Given persistent symptoms despite treatment, will refer to pulmonology for PFT's and evaluation.

## 2018-01-23 NOTE — Assessment & Plan Note (Signed)
Taking levothyroxine inappropriately. Discussed to take first thing in AM with water only, wait 1 hour before food and other meds. Take pantoprazole at lunch.   Repeat TSH pending. We will evaluate once she's been taking appropriately. [place] of thyroid pending given reports of difficulty swallowing with throat fullness.

## 2018-01-23 NOTE — Assessment & Plan Note (Signed)
Could be secondary to uncontrolled asthma, does not seem like she has OSA. Could be secondary to inappropriate consumption of levothyroxine, discussed correct instructions for administration.  Strongly recommended regular exercise to improve fatigue. Check vitamin B 12 and D, TSH today.   She will update.

## 2018-01-23 NOTE — Assessment & Plan Note (Signed)
Stable in the office today, continue amlodipine and olmesartan as prescribed.

## 2018-01-28 ENCOUNTER — Ambulatory Visit
Admission: RE | Admit: 2018-01-28 | Discharge: 2018-01-28 | Disposition: A | Discharge status: Home / Self Care | Payer: 59 | Source: Ambulatory Visit | Attending: Primary Care | Admitting: Primary Care

## 2018-01-28 DIAGNOSIS — E039 Hypothyroidism, unspecified: Secondary | ICD-10-CM

## 2018-01-29 ENCOUNTER — Telehealth: Payer: Self-pay | Admitting: Primary Care

## 2018-01-29 ENCOUNTER — Other Ambulatory Visit: Payer: Self-pay | Admitting: Primary Care

## 2018-01-29 NOTE — Telephone Encounter (Signed)
See result note.  

## 2018-01-29 NOTE — Telephone Encounter (Signed)
Medication have not been prescribed by Anda Kraft  Last office visit on 01/23/2018

## 2018-01-29 NOTE — Telephone Encounter (Signed)
Send to GYN 

## 2018-01-29 NOTE — Telephone Encounter (Signed)
Copied from [place] [id number]. Topic: Quick Communication - See Telephone Encounter >> Jan 29, 2018 11:32 AM Rutherford Nail, NT wrote: CRM for notification. See Telephone encounter for: 01/29/18. Patient calling and states that she is waiting on her thyroid ultrasound results. States that she would not be home until after 2pm today if someone could call then. States she has some appointments she will be at today.

## 2018-01-31 ENCOUNTER — Other Ambulatory Visit: Payer: Self-pay | Admitting: Primary Care

## 2018-02-04 ENCOUNTER — Other Ambulatory Visit: Payer: Self-pay

## 2018-02-05 ENCOUNTER — Other Ambulatory Visit: Payer: Self-pay | Admitting: Primary Care

## 2018-02-05 DIAGNOSIS — I10 Essential (primary) hypertension: Secondary | ICD-10-CM

## 2018-02-05 DIAGNOSIS — J4540 Moderate persistent asthma, uncomplicated: Secondary | ICD-10-CM

## 2018-02-05 MED ORDER — OLMESARTAN MEDOXOMIL 20 MG PO TABS: 20.0000 mg | ORAL_TABLET | Freq: Two times a day (BID) | ORAL | 1 refills | Status: DC

## 2018-02-05 NOTE — Telephone Encounter (Signed)
Katie Robbins, will you check on the magic mouthwash? I sent a refill in late August 2019 so she should have enough.

## 2018-02-05 NOTE — Telephone Encounter (Signed)
I just refilled magic mouth wash one month ago, she typically uses this for several months between refills.  Did the Rx go through from 08/28 when ordered?

## 2018-02-05 NOTE — Telephone Encounter (Signed)
Copied from [place] [id number]. Topic: Quick Communication - Rx Refill/Question >> Feb 05, 2018 12:21 PM Burchel, Abbi R wrote: Medication: estradiol (ESTRACE) 1 MG tablet, magic mouthwash SOLN,  olmesartan (BENICAR) 20 MG tablet   Preferred Pharmacy: [facility], [place] [address] [place] [address] Phone: [phone] Fax: [phone]  Pt states her annual GYN exam is scheduled for December, but she will need refills of her estradiol until then.

## 2018-02-05 NOTE — Telephone Encounter (Signed)
There is another encounter regarding refills

## 2018-02-05 NOTE — Telephone Encounter (Signed)
Estradiol needs to go to GYN office.

## 2018-02-05 NOTE — Telephone Encounter (Signed)
Contacted pt regarding refill requests; pt instructed to call GYN for refills of estradiol since he is the prescribing physician; she also says that she checked with the pharmacy and they said the request was sent but they never received a response for magic mouthwash and olmesartan; will route request to office for final disposition   Magic mouthwash refill Last Refill: 01/08/18 # 360 ml Last OV:12/02/17 PCP: [place]: [place], [place]

## 2018-02-06 ENCOUNTER — Encounter: Payer: Self-pay | Admitting: Emergency Medicine

## 2018-02-06 ENCOUNTER — Ambulatory Visit: Payer: 59 | Admitting: Emergency Medicine

## 2018-02-06 VITALS — BP 180/100 | HR 96 | Ht 63.0 in | Wt 142.0 lb

## 2018-02-06 DIAGNOSIS — K219 Gastro-esophageal reflux disease without esophagitis: Secondary | ICD-10-CM | POA: Diagnosis not present

## 2018-02-06 DIAGNOSIS — J4540 Moderate persistent asthma, uncomplicated: Secondary | ICD-10-CM

## 2018-02-06 DIAGNOSIS — R0600 Dyspnea, unspecified: Secondary | ICD-10-CM | POA: Diagnosis not present

## 2018-02-06 NOTE — Assessment & Plan Note (Signed)
She carries a history of asthma but some the characteristics of her dyspnea are inconsistent.  She has difficulty breathing even at rest, and absence of wheezing or cough.  She does not respond to bronchodilator as one would predict.  Chest x-ray is reassuring.  There may be some crossover between these symptoms and the other she is described including her chronic fatigue, her GERD/nutcracker esophagus.  May be some component of deconditioning as well.  I think she needs primary function testing to clarify how much obstructive lung disease is present.  In the meantime we will continue Symbicort as below.

## 2018-02-06 NOTE — Assessment & Plan Note (Signed)
She is having chest pain, difficult to tease out in the setting of her dysphasia, history of nutcracker esophagus.  She needs to be seen by her gastroenterologist to troubleshoot, determine whether she needs esophageal dilation etc.

## 2018-02-06 NOTE — Progress Notes (Signed)
Subjective:    Patient ID: Katie Robbins, female    DOB: 02-23-1956, 62 y.o.   MRN: [id number]  HPI 62 year old never smoker with a history of hypertension, GERD and nutcracker esophagus (has required dilations also), hypothyroidism, OA, cervical disc disease status post surgical repair 05/2016.  She carries a history of asthma that was made as a young adult in the [employer]. States that she has had progressive dyspnea over many years. Worse over the last 3-4 months. She was changed from pulmicort to symbicort in July > may have helped some. She uses albuterol about 2x a day. She sees GI in [facility], hasn't seen them yet about the dysphagia. CXR from 01/14/18 reviewed by me, normal.   She complains of excessive fatigue, describes chest pain that she associates with her nutcracker esophagus, dyspnea even at rest. She has frequent cough with any activity, occasionally productive. She denies any overt GERD sx on her current protonix.   She was seen by Dr. Janace Hoard with ENT 01/21/2018 for dysphasia and food, pills getting stuck in the pharyngeal esophageal phase when she tries to swallow.  Her thyroid testing has been reassuring. There has been some question of whether her protonix has decreased the efficacy of her other meds. She is also being worked up for possible auto-immune disease.   Review of Systems  Constitutional: Negative for fever and unexpected weight change.  HENT: Negative for congestion, dental problem, ear pain, nosebleeds, postnasal drip, rhinorrhea, sinus pressure, sneezing, sore throat and trouble swallowing.   Eyes: Negative for redness and itching.  Respiratory: Positive for cough, chest tightness and shortness of breath. Negative for wheezing.   Cardiovascular: Negative for palpitations and leg swelling.  Gastrointestinal: Negative for nausea and vomiting.  Genitourinary: Negative for dysuria.  Musculoskeletal: Negative for joint swelling.  Skin: Negative for rash.   Neurological: Negative for headaches.  Hematological: Does not bruise/bleed easily.  Psychiatric/Behavioral: Negative for dysphoric mood. The patient is not nervous/anxious.     Past Medical History:  Diagnosis Date  . Anxiety   . Arthritis   . Asthma   . Chicken pox   . Chronic headaches   . Diverticulitis   . Essential hypertension   . GERD (gastroesophageal reflux disease)   . H/O breast implant   . Hypothyroidism   . Interstitial cystitis   . Mitral prolapse 1985  . Nutcracker esophagus      Family History  Problem Relation Age of Onset  . Hypertension Mother   . GER disease Mother   . Polymyalgia rheumatica Mother   . Arthritis Mother   . GER disease Father      Social History   Socioeconomic History  . Marital status: Married    Spouse name: [name]  . Number of children: 2  . Years of education: Some college  . Highest education level: Not on file  Occupational History  . Occupation: Retired  Scientific laboratory technician  . Financial resource strain: Not on file  . Food insecurity:    Worry: Not on file    Inability: Not on file  . Transportation needs:    Medical: Not on file    Non-medical: Not on file  Tobacco Use  . Smoking status: Never Smoker  . Smokeless tobacco: Never Used  Substance and Sexual Activity  . Alcohol use: Yes    Comment: socially  . Drug use: No  . Sexual activity: Not on file  Lifestyle  . Physical activity:    Days per  week: Not on file    Minutes per session: Not on file  . Stress: Not on file  Relationships  . Social connections:    Talks on phone: Not on file    Gets together: Not on file    Attends religious service: Not on file    Active member of club or organization: Not on file    Attends meetings of clubs or organizations: Not on file    Relationship status: Not on file  . Intimate partner violence:    Fear of current or ex partner: Not on file    Emotionally abused: Not on file    Physically abused: Not on file     Forced sexual activity: Not on file  Other Topics Concern  . Not on file  Social History Narrative   Lives with husband   Caffeine use: rare   Right handed   She was in the [place]; she was in [place] She has lived in [place], [place], [place], [place] Formerly lived in a home with black mold.    Allergies  Allergen Reactions  . Bactrim [Sulfamethoxazole-Trimethoprim] Hives  . Macrobid [Nitrofurantoin Macrocrystal] Hives  . Tizanidine Other (See Comments)    Bad Mood changs  . Vicodin [Hydrocodone-Acetaminophen] Swelling    Puffy eyes and face  . Hydrocodone   . Nexium [Esomeprazole Magnesium]     Face swelling, hives   . Prilosec [Omeprazole]     Face swelling, hives   . Sulfa Antibiotics   . Erythromycin Rash     Outpatient Medications Prior to Visit  Medication Sig Dispense Refill  . albuterol (PROAIR HFA) 108 (90 Base) MCG/ACT inhaler Inhale 2 puffs into the lungs every 6 (six) hours as needed for wheezing or shortness of breath. 1 Inhaler 5  . amLODipine (NORVASC) 5 MG tablet Take 1 tablet by mouth once daily. 90 tablet 1  . amphetamine-dextroamphetamine (ADDERALL) 20 MG tablet Taking 1/2 to 1 tablet 4 times a day needed    . BD PEN NEEDLE NANO U/F 32G X 4 MM MISC USE ONE  4 TIMES DAILY WITH  INSULIN  PEN 300 each 1  . cyanocobalamin (,VITAMIN B-12,) 1000 MCG/ML injection Inject 1 ml into the muscle every 3-4 weeks. 14 mL 3  . diazepam (VALIUM) 5 MG tablet Take 5 mg by mouth every 6 (six) hours as needed.     Marland Kitchen estradiol (ESTRACE) 1 MG tablet Take 1 mg by mouth daily.    Marland Kitchen levothyroxine (SYNTHROID, LEVOTHROID) 112 MCG tablet TAKE 1 TABLET BY MOUTH ONCE A DAY 90 tablet 3  . magic mouthwash SOLN Take 15 mLs by mouth 3 (three) times daily as needed for mouth pain (Modified Magic Mouthwash). 360 mL 0  . olmesartan (BENICAR) 20 MG tablet TAKE 1 TABLET BY MOUTH TWICE (2) DAILY 180 tablet 3  . pantoprazole (PROTONIX) 40 MG tablet Take 1 tablet (40 mg total) by mouth 2 (two) times daily. 60 tablet 5   . SYMBICORT 160-4.5 MCG/ACT inhaler INHALE 2 PUFFS INTO THE LUNGS TWICE DAILY 10.2 g 5  . olmesartan (BENICAR) 20 MG tablet Take 1 tablet (20 mg total) by mouth 2 (two) times daily. 60 tablet 1   No facility-administered medications prior to visit.         Objective:   Physical Exam Vitals:   02/06/18 0901  BP: (!) 180/100  Pulse: 96  SpO2: 98%  Weight: 142 lb (64.4 kg)  Height: 5\' 3"  (1.6 m)   Gen: Pleasant, well-nourished,  in no distress, scattered affect, very distractible   ENT: No lesions,  mouth clear,  oropharynx clear, no postnasal drip  Neck: No JVD, no stridor  Lungs: No use of accessory muscles, no wheeze or crackles  Cardiovascular: RRR, heart sounds normal, no murmur or gallops, no peripheral edema  Musculoskeletal: No deformities, no cyanosis or clubbing  Neuro: alert, non focal  Skin: Warm, no lesions or rash      Assessment & Plan:  Dyspnea She carries a history of asthma but some the characteristics of her dyspnea are inconsistent.  She has difficulty breathing even at rest, and absence of wheezing or cough.  She does not respond to bronchodilator as one would predict.  Chest x-ray is reassuring.  There may be some crossover between these symptoms and the other she is described including her chronic fatigue, her GERD/nutcracker esophagus.  May be some component of deconditioning as well.  I think she needs primary function testing to clarify how much obstructive lung disease is present.  In the meantime we will continue Symbicort as below.  Asthma We will continue Symbicort for now.  Again unclear to me whether her progressive dyspnea reflects worsening of her asthma.  Keep albuterol available to use as needed.  If her asthma is progressive than consider contributions of breakthrough reflux, even silent reflux.  Esophageal reflux She is having chest pain, difficult to tease out in the setting of her dysphasia, history of nutcracker esophagus.  She needs  to be seen by her gastroenterologist to troubleshoot, determine whether she needs esophageal dilation etc.  Baltazar Apo, MD, PhD 02/06/2018, 9:33 AM [facility] [phone] or if no answer [phone]

## 2018-02-06 NOTE — Telephone Encounter (Signed)
Addressed in another telephone encounter.

## 2018-02-06 NOTE — Assessment & Plan Note (Signed)
We will continue Symbicort for now.  Again unclear to me whether her progressive dyspnea reflects worsening of her asthma.  Keep albuterol available to use as needed.  If her asthma is progressive than consider contributions of breakthrough reflux, even silent reflux.

## 2018-02-06 NOTE — Telephone Encounter (Signed)
Okay to refill. Recommend she talk with her GI doctor about her symptoms.

## 2018-02-06 NOTE — Telephone Encounter (Signed)
Patient was notified by triage nurse of Tawni Millers comments per another telephone encounter on 02/05/2018.

## 2018-02-06 NOTE — Telephone Encounter (Signed)
Spoken to patient and she stated that she has been having more mouth pain and hard swallow, she been this more. She didn't want do take too much Valium. So she would like a refill.

## 2018-02-06 NOTE — Patient Instructions (Signed)
We will perform full pulmonary function testing. Follow with Dr Lamonte Sakai next available with PFT to review your results together.

## 2018-02-11 ENCOUNTER — Encounter: Payer: Self-pay | Admitting: Primary Care

## 2018-02-11 NOTE — Telephone Encounter (Signed)
Thank you.  Please have her send this information to [place] or bring it to her next visit so that we can included in her clinic chart.

## 2018-02-11 NOTE — Telephone Encounter (Signed)
RB please advise. Thanks.  

## 2018-02-11 NOTE — Telephone Encounter (Signed)
Spoken and notified patient of [name] Clark's comments. Patient verbalized understanding.  

## 2018-02-24 ENCOUNTER — Telehealth: Payer: Self-pay

## 2018-02-24 NOTE — Telephone Encounter (Signed)
Noted  

## 2018-02-24 NOTE — Telephone Encounter (Signed)
Copied from [place] [id number]. Topic: General - Other >> Feb 24, 2018  3:17 PM Yvette Rack wrote: Reason for CRM: Pt states she saw her GI doctor and she got a refill on the 360 mouth swallow and she is being dilated for her throat on Wednesday 02/26/18 at 6:30 am. Cb# [phone]

## 2018-03-05 ENCOUNTER — Other Ambulatory Visit: Payer: Self-pay | Admitting: Primary Care

## 2018-03-05 DIAGNOSIS — E538 Deficiency of other specified B group vitamins: Secondary | ICD-10-CM

## 2018-03-05 NOTE — Telephone Encounter (Signed)
Last prescribed on 11/28/2016 Last office visit on 01/23/2018

## 2018-03-05 NOTE — Telephone Encounter (Signed)
Sent patient mychart message

## 2018-03-07 NOTE — Telephone Encounter (Signed)
Refill sent to pharmacy. Heard back from patient via my chart.

## 2018-03-20 ENCOUNTER — Ambulatory Visit: Payer: 59 | Admitting: Emergency Medicine

## 2018-03-20 ENCOUNTER — Ambulatory Visit (INDEPENDENT_AMBULATORY_CARE_PROVIDER_SITE_OTHER): Payer: 59 | Admitting: Emergency Medicine

## 2018-03-20 ENCOUNTER — Encounter: Payer: Self-pay | Admitting: Emergency Medicine

## 2018-03-20 DIAGNOSIS — J4540 Moderate persistent asthma, uncomplicated: Secondary | ICD-10-CM

## 2018-03-20 DIAGNOSIS — R0600 Dyspnea, unspecified: Secondary | ICD-10-CM | POA: Diagnosis not present

## 2018-03-20 LAB — PULMONARY FUNCTION TEST
DL/VA % pred: 88 %
DL/VA: 4.02 ml/min/mmHg/L
DLCO cor % pred: 81 %
DLCO cor: 17.67 ml/min/mmHg
DLCO unc % pred: 78 %
DLCO unc: 16.92 ml/min/mmHg
FEF 25-75 Post: 2.74 L/sec
FEF 25-75 Pre: 1.47 L/sec
FEF2575-%Change-Post: 86 %
FEF2575-%Pred-Post: 127 %
FEF2575-%Pred-Pre: 68 %
FEV1-%Change-Post: 13 %
FEV1-%Pred-Post: 95 %
FEV1-%Pred-Pre: 84 %
FEV1-Post: 2.21 L
FEV1-Pre: 1.96 L
FEV1FVC-%Change-Post: 3 %
FEV1FVC-%Pred-Pre: 98 %
FEV6-%Change-Post: 9 %
FEV6-%Pred-Post: 96 %
FEV6-%Pred-Pre: 88 %
FEV6-Post: 2.8 L
FEV6-Pre: 2.56 L
FEV6FVC-%Change-Post: 0 %
FEV6FVC-%Pred-Post: 103 %
FEV6FVC-%Pred-Pre: 103 %
FVC-%Change-Post: 9 %
FVC-%Pred-Post: 93 %
FVC-%Pred-Pre: 85 %
FVC-Post: 2.8 L
FVC-Pre: 2.56 L
Post FEV1/FVC ratio: 79 %
Post FEV6/FVC ratio: 100 %
Pre FEV1/FVC ratio: 76 %
Pre FEV6/FVC Ratio: 100 %
RV % pred: 250 %
RV: 4.83 L
TLC % pred: 158 %
TLC: 7.51 L

## 2018-03-20 NOTE — Patient Instructions (Signed)
Please continue Symbicort 2 puffs twice a day.  Remember to rinse and gargle after you use this. Continue your albuterol 2 puffs up to every 4 hours if needed for shortness of breath, chest tightness, wheezing. Your Pneumovax pneumonia shot is up-to-date.  You will need this again after age 62. Get the flu shot this fall. Follow with Dr Lamonte Sakai in 12 months or sooner if you have any problems

## 2018-03-20 NOTE — Assessment & Plan Note (Signed)
Multiple overlapping somatic complaints but her asthma was confirmed on her pulmonary function testing today.  Mild obstruction with a positive bronchodilator response.  She has clinically benefited from the Symbicort and I think we can continue it.  She has albuterol and knows to use it on an as-needed basis.  Contributors to her asthma appear to be fairly well managed including her GERD.  Please continue Symbicort 2 puffs twice a day.  Remember to rinse and gargle after you use this. Continue your albuterol 2 puffs up to every 4 hours if needed for shortness of breath, chest tightness, wheezing. Your Pneumovax pneumonia shot is up-to-date.  You will need this again after age 68. Get the flu shot this fall. Follow with Dr Lamonte Sakai in 12 months or sooner if you have any problems

## 2018-03-20 NOTE — Progress Notes (Signed)
Patient completed full PFT today. 

## 2018-03-20 NOTE — Progress Notes (Signed)
Subjective:    Patient ID: Katie Robbins, female    DOB: April 07, 1956, 62 y.o.   MRN: [id number]  HPI 62 year old never smoker with a history of hypertension, GERD and nutcracker esophagus (has required dilations also), hypothyroidism, OA, cervical disc disease status post surgical repair 05/2016.  She carries a history of asthma that was made as a young adult in the [employer]. States that she has had progressive dyspnea over many years. Worse over the last 3-4 months. She was changed from pulmicort to symbicort in July > may have helped some. She uses albuterol about 2x a day. She sees GI in [facility], hasn't seen them yet about the dysphagia. CXR from 01/14/18 reviewed by me, normal.   She complains of excessive fatigue, describes chest pain that she associates with her nutcracker esophagus, dyspnea even at rest. She has frequent cough with any activity, occasionally productive. She denies any overt GERD sx on her current protonix.   She was seen by Dr. Janace Hoard with ENT 01/21/2018 for dysphasia and food, pills getting stuck in the pharyngeal esophageal phase when she tries to swallow.  Her thyroid testing has been reassuring. There has been some question of whether her protonix has decreased the efficacy of her other meds. She is also being worked up for possible auto-immune disease.   ROV 03/20/18 --this is a 62 year old woman who follows up today for multifactorial dyspnea.  At least part of her dyspnea is likely explained by history of asthma.  She also has nutcracker esophagus with some chest discomfort, difficulty getting a deep breath, difficulty with swallowing and dysphasia.  She underwent pulmonary function testing today which I reviewed.  This shows mild obstruction with a positive bronchodilator response, profoundly hyperinflated lung volumes, decreased diffusion capacity that corrects to normal range when adjusted for alveolar volume.   She has esophageal dilation done 02/26/18 by Dr Alan Ripper,  [place]. Still has GERD. Continued on protonix 40mg  bid. She believes that she is still having discomfort with drinking, eating. Her breathing has been intermittently bad, can be helped by her albuterol via spacer. She remains on Symbicort, takes reliably, gargles after using. Pneumovax up to date. Hasn't had flu shot yet.   Review of Systems  Constitutional: Negative for fever and unexpected weight change.  HENT: Negative for congestion, dental problem, ear pain, nosebleeds, postnasal drip, rhinorrhea, sinus pressure, sneezing, sore throat and trouble swallowing.   Eyes: Negative for redness and itching.  Respiratory: Positive for cough, chest tightness and shortness of breath. Negative for wheezing.   Cardiovascular: Negative for palpitations and leg swelling.  Gastrointestinal: Negative for nausea and vomiting.  Genitourinary: Negative for dysuria.  Musculoskeletal: Negative for joint swelling.  Skin: Negative for rash.  Neurological: Negative for headaches.  Hematological: Does not bruise/bleed easily.  Psychiatric/Behavioral: Negative for dysphoric mood. The patient is not nervous/anxious.     Past Medical History:  Diagnosis Date  . Anxiety   . Arthritis   . Asthma   . Chicken pox   . Chronic headaches   . Diverticulitis   . Essential hypertension   . GERD (gastroesophageal reflux disease)   . Guillain Barr syndrome ([place]) 1982  . H/O breast implant   . Hypothyroidism   . Interstitial cystitis   . Mitral prolapse 1985  . Nutcracker esophagus      Family History  Problem Relation Age of Onset  . Hypertension Mother   . GER disease Mother   . Polymyalgia rheumatica Mother   . Arthritis  Mother   . GER disease Father      Social History   Socioeconomic History  . Marital status: Married    Spouse name: [name]  . Number of children: 2  . Years of education: Some college  . Highest education level: Not on file  Occupational History  . Occupation: Retired  Photographer  . Financial resource strain: Not on file  . Food insecurity:    Worry: Not on file    Inability: Not on file  . Transportation needs:    Medical: Not on file    Non-medical: Not on file  Tobacco Use  . Smoking status: Never Smoker  . Smokeless tobacco: Never Used  Substance and Sexual Activity  . Alcohol use: Yes    Comment: socially  . Drug use: No  . Sexual activity: Not on file  Lifestyle  . Physical activity:    Days per week: Not on file    Minutes per session: Not on file  . Stress: Not on file  Relationships  . Social connections:    Talks on phone: Not on file    Gets together: Not on file    Attends religious service: Not on file    Active member of club or organization: Not on file    Attends meetings of clubs or organizations: Not on file    Relationship status: Not on file  . Intimate partner violence:    Fear of current or ex partner: Not on file    Emotionally abused: Not on file    Physically abused: Not on file    Forced sexual activity: Not on file  Other Topics Concern  . Not on file  Social History Narrative   Lives with husband   Caffeine use: rare   Right handed   She was in the [employer]; she was in [place] She has lived in [place], [place], [place], [place] Formerly lived in a home with black mold.    Allergies  Allergen Reactions  . Bactrim [Sulfamethoxazole-Trimethoprim] Hives  . Macrobid [Nitrofurantoin Macrocrystal] Hives  . Tizanidine Other (See Comments)    Bad Mood changs  . Vicodin [Hydrocodone-Acetaminophen] Swelling    Puffy eyes and face  . Hydrocodone   . Nexium [Esomeprazole Magnesium]     Face swelling, hives   . Prilosec [Omeprazole]     Face swelling, hives   . Sulfa Antibiotics   . Erythromycin Rash     Outpatient Medications Prior to Visit  Medication Sig Dispense Refill  . albuterol (PROAIR HFA) 108 (90 Base) MCG/ACT inhaler Inhale 2 puffs into the lungs every 6 (six) hours as needed for wheezing or shortness of breath. 1 Inhaler  5  . amLODipine (NORVASC) 5 MG tablet Take 1 tablet by mouth once daily. 90 tablet 1  . amphetamine-dextroamphetamine (ADDERALL) 20 MG tablet Taking 1/2 to 1 tablet 4 times a day needed    . BD PEN NEEDLE NANO U/F 32G X 4 MM MISC USE ONE  4 TIMES DAILY WITH  INSULIN  PEN 300 each 1  . cyanocobalamin (,VITAMIN B-12,) 1000 MCG/ML injection INJECT 1 VIAL ( 1ML) INTO THE MUSCLE EVERY 3 TO 4 WEEKS 24 mL 1  . diazepam (VALIUM) 5 MG tablet Take 5 mg by mouth every 6 (six) hours as needed.     Marland Kitchen estradiol (ESTRACE) 1 MG tablet Take 1 mg by mouth daily.    Marland Kitchen levothyroxine (SYNTHROID, LEVOTHROID) 112 MCG tablet TAKE 1 TABLET BY MOUTH ONCE A  DAY 90 tablet 3  . magic mouthwash SOLN Take 15 mLs by mouth 3 (three) times daily as needed for mouth pain (Modified Magic Mouthwash). 360 mL 0  . olmesartan (BENICAR) 20 MG tablet TAKE 1 TABLET BY MOUTH TWICE (2) DAILY 180 tablet 3  . pantoprazole (PROTONIX) 40 MG tablet Take 1 tablet (40 mg total) by mouth 2 (two) times daily. 60 tablet 5  . SYMBICORT 160-4.5 MCG/ACT inhaler INHALE 2 PUFFS INTO THE LUNGS TWICE DAILY 10.2 g 5   No facility-administered medications prior to visit.         Objective:   Physical Exam Vitals:   03/20/18 1431  BP: 118/78  Pulse: 90  SpO2: 99%  Weight: 143 lb (64.9 kg)  Height: 5\' 2"  (1.575 m)   Gen: Pleasant, well-nourished, in no distress, scattered affect, very distractible   ENT: No lesions,  mouth clear,  oropharynx clear, no postnasal drip  Neck: No JVD, no stridor  Lungs: No use of accessory muscles, no wheeze or crackles  Cardiovascular: RRR, heart sounds normal, no murmur or gallops, no peripheral edema  Musculoskeletal: No deformities, no cyanosis or clubbing  Neuro: alert, non focal  Skin: Warm, no lesions or rash      Assessment & Plan:  Asthma Multiple overlapping somatic complaints but her asthma was confirmed on her pulmonary function testing today.  Mild obstruction with a positive bronchodilator  response.  She has clinically benefited from the Symbicort and I think we can continue it.  She has albuterol and knows to use it on an as-needed basis.  Contributors to her asthma appear to be fairly well managed including her GERD.  Please continue Symbicort 2 puffs twice a day.  Remember to rinse and gargle after you use this. Continue your albuterol 2 puffs up to every 4 hours if needed for shortness of breath, chest tightness, wheezing. Your Pneumovax pneumonia shot is up-to-date.  You will need this again after age 13. Get the flu shot this fall. Follow with Dr Lamonte Sakai in 12 months or sooner if you have any problems  Baltazar Apo, MD, PhD 03/20/2018, 3:13 PM [facility] [phone] or if no answer [phone]

## 2018-03-25 ENCOUNTER — Encounter: Payer: 59 | Admitting: Psychology

## 2018-03-25 ENCOUNTER — Encounter

## 2018-04-14 ENCOUNTER — Other Ambulatory Visit: Payer: Self-pay

## 2018-04-14 ENCOUNTER — Encounter: Payer: Self-pay | Admitting: Neurology

## 2018-04-14 ENCOUNTER — Ambulatory Visit: Payer: 59 | Admitting: Neurology

## 2018-04-14 ENCOUNTER — Encounter

## 2018-04-14 VITALS — BP 152/93 | HR 91 | Resp 16 | Ht 62.0 in | Wt 137.0 lb

## 2018-04-14 DIAGNOSIS — R413 Other amnesia: Secondary | ICD-10-CM

## 2018-04-14 DIAGNOSIS — R1312 Dysphagia, oropharyngeal phase: Secondary | ICD-10-CM

## 2018-04-14 NOTE — Progress Notes (Signed)
Reason for visit: Memory disturbance  Katie Robbins is an 62 y.o. female  History of present illness:  Katie Robbins is a 62 year old right-handed white female with a history of a prior concussion in 2015, she claims to have had a change in her cognitive status since that time.  She has difficulty with math functions, she has short-term memory issues, she will stay up until around midnight and then get up at 7 AM.  She denies any problems with fatigue during the day.  She takes Adderall in the morning.  She has poor time management, she has difficulty following the plot of movies when watching TV.  She is concerned about a gradual change in her memory, prior neurocognitive evaluation done in 2015 was normal.  She returns to this office for an evaluation.  The patient was set up for neurocognitive evaluation but this was canceled as the neuropsychologist is leaving.  The patient also reports some sensation of food and pills getting stuck in her throat, she has some discomfort with this.  This is a chronic issue.  Past Medical History:  Diagnosis Date  . Anxiety   . Arthritis   . Asthma   . Chicken pox   . Chronic headaches   . Diverticulitis   . Essential hypertension   . GERD (gastroesophageal reflux disease)   . Guillain Barr syndrome ([place]) 1982  . H/O breast implant   . Hypothyroidism   . Interstitial cystitis   . Mitral prolapse 1985  . Nutcracker esophagus     Past Surgical History:  Procedure Laterality Date  . ABDOMINAL HYSTERECTOMY  2000/2009  . APPENDECTOMY  2007  . BOWEL RESECTION  2007  . CARDIAC CATHETERIZATION  2016   Clean  . CARPAL TUNNEL RELEASE Right   . CHOLECYSTECTOMY  2012  . CHOLECYSTECTOMY  2012  . NECK SURGERY  06/11/2016   plates and screws in neck   . SMALL INTESTINE SURGERY  2007   Bowel resection  . TONSILLECTOMY    . VESICO-VAGINAL FISTULA REPAIR  2007    Family History  Problem Relation Age of Onset  . Hypertension Mother   . GER disease  Mother   . Polymyalgia rheumatica Mother   . Arthritis Mother   . GER disease Father     Social history:  reports that she has never smoked. She has never used smokeless tobacco. She reports that she drinks alcohol. She reports that she does not use drugs.    Allergies  Allergen Reactions  . Bactrim [Sulfamethoxazole-Trimethoprim] Hives  . Macrobid [Nitrofurantoin Macrocrystal] Hives  . Tizanidine Other (See Comments)    Bad Mood changs  . Vicodin [Hydrocodone-Acetaminophen] Swelling    Puffy eyes and face  . Hydrocodone   . Nexium [Esomeprazole Magnesium]     Face swelling, hives   . Prilosec [Omeprazole]     Face swelling, hives   . Sulfa Antibiotics   . Erythromycin Rash    Medications:  Prior to Admission medications   Medication Sig Start Date End Date Taking? Authorizing Provider  albuterol (PROAIR HFA) 108 (90 Base) MCG/ACT inhaler Inhale 2 puffs into the lungs every 6 (six) hours as needed for wheezing or shortness of breath. 04/17/17  Yes Pleas Koch, NP  amLODipine (NORVASC) 5 MG tablet Take 1 tablet by mouth once daily. 09/06/17  Yes Pleas Koch, NP  amphetamine-dextroamphetamine (ADDERALL) 20 MG tablet Taking 1/2 to 1 tablet 4 times a day needed 07/28/15  Yes [provider]  BD PEN NEEDLE NANO U/F 32G X 4 MM MISC USE ONE  4 TIMES DAILY WITH  INSULIN  PEN 04/08/17  Yes Pleas Koch, NP  cyanocobalamin (,VITAMIN B-12,) 1000 MCG/ML injection INJECT 1 VIAL ( 1ML) INTO THE MUSCLE EVERY 3 TO 4 WEEKS 03/07/18  Yes Pleas Koch, NP  diazepam (VALIUM) 5 MG tablet Take 5 mg by mouth every 6 (six) hours as needed.  07/11/16  Yes [provider]  estradiol (ESTRACE) 1 MG tablet Take 1 mg by mouth daily.   Yes [provider]  levothyroxine (SYNTHROID, LEVOTHROID) 112 MCG tablet TAKE 1 TABLET BY MOUTH ONCE A DAY 12/11/17  Yes Pleas Koch, NP  magic mouthwash SOLN Take 15 mLs by mouth 3 (three) times daily as needed for mouth  pain (Modified Magic Mouthwash). 01/08/18  Yes Pleas Koch, NP  olmesartan (BENICAR) 20 MG tablet TAKE 1 TABLET BY MOUTH TWICE (2) DAILY 02/05/18  Yes Pleas Koch, NP  pantoprazole (PROTONIX) 40 MG tablet Take 1 tablet (40 mg total) by mouth 2 (two) times daily. 07/18/17  Yes Pleas Koch, NP  SYMBICORT 160-4.5 MCG/ACT inhaler INHALE 2 PUFFS INTO THE LUNGS TWICE DAILY 02/05/18  Yes Pleas Koch, NP    ROS:  Out of a complete 14 system review of symptoms, the patient complains only of the following symptoms, and all other reviewed systems are negative.  Memory problems Pain with swallowing  Blood pressure (!) 152/93, pulse 91, resp. rate 16, height 5\' 2"  (1.575 m), weight 137 lb (62.1 kg).  Physical Exam  General: The patient is alert and cooperative at the time of the examination.  Skin: No significant peripheral edema is noted.   Neurologic Exam  Mental status: The patient is alert and oriented x 3 at the time of the examination. The Mini-Mental status examination done today shows a total score 26/30.  The patient is able to name 10 four-legged animals in 1 minute.   Cranial nerves: Facial symmetry is present. Speech is normal, no aphasia or dysarthria is noted. Extraocular movements are full. Visual fields are full.  Motor: The patient has good strength in all 4 extremities.  Sensory examination: Soft touch sensation is symmetric on the face, arms, and legs.  Coordination: The patient has good finger-nose-finger and heel-to-shin bilaterally.  Gait and station: The patient has a normal gait. Tandem gait is unsteady. Romberg is unsteady, the patient has a tendency to fall. No drift is seen.  Reflexes: Deep tendon reflexes are symmetric.   Assessment/Plan:  1.  Memory disturbance  2.  Cervicogenic headache  3.  Reports of dysphagia  The patient has ongoing reports of memory problems.  We will re-set up a appointment for neuropsychological testing.   The patient reports some difficulty with swallowing, food and pills are getting stuck in her throat.  We will get a modified barium swallow.  The cervicogenic headaches are better, she is getting dry needling in the neck which is beneficial, she gets only an occasional headache at this point.  She will follow-up in 6 months.   Jill Alexanders MD 04/14/2018 10:51 AM  [facility] [address] [place], [place] [address]  Phone [phone] Fax [phone]

## 2018-04-21 ENCOUNTER — Other Ambulatory Visit (HOSPITAL_COMMUNITY): Payer: Self-pay

## 2018-04-21 ENCOUNTER — Telehealth (HOSPITAL_COMMUNITY): Payer: Self-pay

## 2018-04-21 ENCOUNTER — Encounter (HOSPITAL_COMMUNITY): Payer: Self-pay

## 2018-04-21 DIAGNOSIS — R1310 Dysphagia, unspecified: Secondary | ICD-10-CM

## 2018-04-21 NOTE — Telephone Encounter (Signed)
2nd attempt to contact pt to schedule MBS - lm on vm. Mailed letter.

## 2018-04-21 NOTE — Progress Notes (Deleted)
Good Afternoon,  We have received a referral from Dr.Willis to schedule a Modified Barium Swallow Study. We have attempted to contact by phone to schedule, please call [facility] to schedule. Please see attached brochure for more information.   Sincerely, Acute Rehab

## 2018-04-24 ENCOUNTER — Encounter: Payer: Self-pay | Admitting: Primary Care

## 2018-04-24 ENCOUNTER — Ambulatory Visit: Payer: 59 | Admitting: Primary Care

## 2018-04-24 ENCOUNTER — Encounter: Payer: Self-pay | Admitting: *Deleted

## 2018-04-24 ENCOUNTER — Encounter: Payer: 59 | Admitting: Psychology

## 2018-04-24 DIAGNOSIS — J3089 Other allergic rhinitis: Secondary | ICD-10-CM | POA: Diagnosis not present

## 2018-04-24 NOTE — Assessment & Plan Note (Signed)
Postnasal drip, cough, chest congestion x11 days that is only evident in the morning. Given history of asthma and GERD, suspect allergy involvement. Exam today is unremarkable, she looks very well and feels very well. Will trial Allegra daily along with her inhalers.  She will update in 1 to 2 weeks if no improvement. Return precautions provided.

## 2018-04-24 NOTE — Progress Notes (Signed)
Subjective:    Patient ID: Katie Robbins, female    DOB: 04-03-1956, 62 y.o.   MRN: [id number]  HPI  [name] is a 62 year old female with a history of asthma and GERD who presents today with a chief complaint of cough.  She also reports chest congestion, productive cough with green/yellow sputum. Her symptoms are evident in the morning only, and come afternoon her symptoms are resolved. She is compliant to her Symbicort inhaler twice daily, had to use her albuterol inhaler once. She's taken Benadryl a few times without improvement. She denies sore throat, fatigue, fevers. Overall she feels fine. Her symptoms began 11 days ago.   Review of Systems  Constitutional: Negative for chills, fatigue and fever.  HENT: Positive for congestion and postnasal drip. Negative for ear pain and sore throat.   Respiratory: Positive for cough. Negative for shortness of breath.   Cardiovascular: Negative for chest pain.  Allergic/Immunologic: Positive for environmental allergies.  Neurological: Negative for dizziness and headaches.       Past Medical History:  Diagnosis Date  . Anxiety   . Arthritis   . Asthma   . Chicken pox   . Chronic headaches   . Diverticulitis   . Essential hypertension   . GERD (gastroesophageal reflux disease)   . Guillain Barr syndrome ([place]) 1982  . H/O breast implant   . Hypothyroidism   . Interstitial cystitis   . Mitral prolapse 1985  . Nutcracker esophagus      Social History   Socioeconomic History  . Marital status: Married    Spouse name: Katie Robbins  . Number of children: 2  . Years of education: Some college  . Highest education level: Not on file  Occupational History  . Occupation: Retired  Scientific laboratory technician  . Financial resource strain: Not on file  . Food insecurity:    Worry: Not on file    Inability: Not on file  . Transportation needs:    Medical: Not on file    Non-medical: Not on file  Tobacco Use  . Smoking status: Never Smoker  .  Smokeless tobacco: Never Used  Substance and Sexual Activity  . Alcohol use: Yes    Comment: socially  . Drug use: No  . Sexual activity: Not on file  Lifestyle  . Physical activity:    Days per week: Not on file    Minutes per session: Not on file  . Stress: Not on file  Relationships  . Social connections:    Talks on phone: Not on file    Gets together: Not on file    Attends religious service: Not on file    Active member of club or organization: Not on file    Attends meetings of clubs or organizations: Not on file    Relationship status: Not on file  . Intimate partner violence:    Fear of current or ex partner: Not on file    Emotionally abused: Not on file    Physically abused: Not on file    Forced sexual activity: Not on file  Other Topics Concern  . Not on file  Social History Narrative   Lives with husband   Caffeine use: rare   Right handed     Past Surgical History:  Procedure Laterality Date  . ABDOMINAL HYSTERECTOMY  2000/2009  . APPENDECTOMY  2007  . BOWEL RESECTION  2007  . CARDIAC CATHETERIZATION  2016   Clean  . CARPAL TUNNEL RELEASE Right   .  CHOLECYSTECTOMY  2012  . CHOLECYSTECTOMY  2012  . NECK SURGERY  06/11/2016   plates and screws in neck   . SMALL INTESTINE SURGERY  2007   Bowel resection  . TONSILLECTOMY    . VESICO-VAGINAL FISTULA REPAIR  2007    Family History  Problem Relation Age of Onset  . Hypertension Mother   . GER disease Mother   . Polymyalgia rheumatica Mother   . Arthritis Mother   . GER disease Father     Allergies  Allergen Reactions  . Bactrim [Sulfamethoxazole-Trimethoprim] Hives  . Macrobid [Nitrofurantoin Macrocrystal] Hives  . Tizanidine Other (See Comments)    Bad Mood changs  . Vicodin [Hydrocodone-Acetaminophen] Swelling    Puffy eyes and face  . Hydrocodone   . Nexium [Esomeprazole Magnesium]     Face swelling, hives   . Prilosec [Omeprazole]     Face swelling, hives   . Sulfa Antibiotics   .  Erythromycin Rash    Current Outpatient Medications on File Prior to Visit  Medication Sig Dispense Refill  . albuterol (PROAIR HFA) 108 (90 Base) MCG/ACT inhaler Inhale 2 puffs into the lungs every 6 (six) hours as needed for wheezing or shortness of breath. 1 Inhaler 5  . amLODipine (NORVASC) 5 MG tablet Take 1 tablet by mouth once daily. 90 tablet 1  . amphetamine-dextroamphetamine (ADDERALL) 20 MG tablet Taking 1/2 to 1 tablet 4 times a day needed    . BD PEN NEEDLE NANO U/F 32G X 4 MM MISC USE ONE  4 TIMES DAILY WITH  INSULIN  PEN 300 each 1  . cyanocobalamin (,VITAMIN B-12,) 1000 MCG/ML injection INJECT 1 VIAL ( 1ML) INTO THE MUSCLE EVERY 3 TO 4 WEEKS 24 mL 1  . diazepam (VALIUM) 5 MG tablet Take 5 mg by mouth every 6 (six) hours as needed.     Marland Kitchen estradiol (ESTRACE) 1 MG tablet Take 1 mg by mouth daily.    Marland Kitchen levothyroxine (SYNTHROID, LEVOTHROID) 112 MCG tablet TAKE 1 TABLET BY MOUTH ONCE A DAY 90 tablet 3  . magic mouthwash SOLN Take 15 mLs by mouth 3 (three) times daily as needed for mouth pain (Modified Magic Mouthwash). 360 mL 0  . olmesartan (BENICAR) 20 MG tablet TAKE 1 TABLET BY MOUTH TWICE (2) DAILY 180 tablet 3  . pantoprazole (PROTONIX) 40 MG tablet Take 1 tablet (40 mg total) by mouth 2 (two) times daily. 60 tablet 5  . SYMBICORT 160-4.5 MCG/ACT inhaler INHALE 2 PUFFS INTO THE LUNGS TWICE DAILY 10.2 g 5   No current facility-administered medications on file prior to visit.     BP (!) 144/72   Pulse 74   Temp 98.2 F (36.8 C) (Oral)   Ht 5\' 2"  (1.575 m)   Wt 143 lb 12 oz (65.2 kg)   SpO2 97%   BMI 26.29 kg/m    Objective:   Physical Exam  Constitutional: She appears well-nourished. She does not appear ill.  HENT:  Right Ear: Tympanic membrane and ear canal normal.  Left Ear: Tympanic membrane and ear canal normal.  Nose: No mucosal edema. Right sinus exhibits no maxillary sinus tenderness and no frontal sinus tenderness. Left sinus exhibits no maxillary sinus  tenderness and no frontal sinus tenderness.  Mouth/Throat: Oropharynx is clear and moist.  Neck: Neck supple.  Cardiovascular: Normal rate and regular rhythm.  Respiratory: Effort normal and breath sounds normal. She has no wheezes.  Skin: Skin is warm and dry.  Assessment & Plan:

## 2018-04-24 NOTE — Patient Instructions (Signed)
Start taking Allegra once daily for symptoms.   Please call me in one week if no improvement. Call me sooner if you start running fevers, start feeling bad, your cough progresses.  It was a pleasure to see you today!

## 2018-05-01 ENCOUNTER — Ambulatory Visit (HOSPITAL_COMMUNITY)
Admission: RE | Admit: 2018-05-01 | Discharge: 2018-05-01 | Disposition: A | Discharge status: Home / Self Care | Payer: 59 | Source: Ambulatory Visit | Attending: Neurology | Admitting: Neurology

## 2018-05-01 DIAGNOSIS — R1310 Dysphagia, unspecified: Secondary | ICD-10-CM

## 2018-05-01 DIAGNOSIS — R1312 Dysphagia, oropharyngeal phase: Secondary | ICD-10-CM | POA: Insufficient documentation
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
## 2018-05-29 ENCOUNTER — Telehealth: Payer: Self-pay

## 2018-05-29 ENCOUNTER — Encounter: Payer: Self-pay | Admitting: Internal Medicine

## 2018-05-29 ENCOUNTER — Ambulatory Visit: Payer: 59 | Admitting: Internal Medicine

## 2018-05-29 VITALS — BP 140/82 | HR 80 | Temp 98.2°F | Wt 147.0 lb

## 2018-05-29 DIAGNOSIS — R310 Gross hematuria: Secondary | ICD-10-CM

## 2018-05-29 DIAGNOSIS — R3989 Other symptoms and signs involving the genitourinary system: Secondary | ICD-10-CM | POA: Diagnosis not present

## 2018-05-29 DIAGNOSIS — R300 Dysuria: Secondary | ICD-10-CM | POA: Diagnosis not present

## 2018-05-29 DIAGNOSIS — R350 Frequency of micturition: Secondary | ICD-10-CM | POA: Diagnosis not present

## 2018-05-29 DIAGNOSIS — N3001 Acute cystitis with hematuria: Secondary | ICD-10-CM

## 2018-05-29 LAB — POC URINALSYSI DIPSTICK (AUTOMATED)
Bilirubin, UA: NEGATIVE
Glucose, UA: NEGATIVE
Ketones, UA: NEGATIVE
Nitrite, UA: NEGATIVE
Protein, UA: POSITIVE — AB
Spec Grav, UA: 1.025 (ref 1.010–1.025)
Urobilinogen, UA: 0.2 E.U./dL
pH, UA: 6 (ref 5.0–8.0)

## 2018-05-29 MED ORDER — CEPHALEXIN 500 MG PO CAPS: 500.0000 mg | ORAL_CAPSULE | Freq: Two times a day (BID) | ORAL | 0 refills | Status: DC

## 2018-05-29 NOTE — Telephone Encounter (Signed)
[facility] Patient Name: Katie Robbins Gender: Female DOB: [birth year] Age: 63 Y 65 M 27 D Return Phone Number: [phone] (Primary) [place] [address] Client [place] Night - Client Client Site [place] Physician [name] - NP Contact Type Call Who Is Calling Patient / Member / Family / Caregiver Call Type Triage / Clinical [name] Name [place]ha Relationship To Patient Self Return Phone Number [phone] (Primary) Chief Complaint Urine, Blood In Reason for Call Symptomatic / Request for [place] states they have a bladder infection. They had physical therapy for scar tissue today. Sx include blood in urine. Translation No Nurse Assessment Nurse: Donata Clay, RN, Cindy Date/Time (Eastern Time): 05/28/2018 10:43:20 PM Confirm and document reason for call. If symptomatic, describe symptoms. ---[name] states they have a bladder infection; has blood in urine and pain and burning after urination, pressure, and frequency. Felt a little bit of pain earlier today during her urology PT done for scar tissue today. Has had many UTIs. No fever. No flank pain. Does the patient have any new or worsening symptoms? ---Yes Will a triage be completed? ---Yes Related visit to physician within the last 2 weeks? ---No Does the PT have any chronic conditions? (i.e. diabetes, asthma, this includes High risk factors for pregnancy, etc.) ---No Is this a behavioral health or substance abuse call? ---No Guidelines Guideline Title Affirmed Question Affirmed Notes Nurse Date/Time (Eastern Time) Urine - Blood In Pain or burning with passing urine Larmore, RN, Cindy 05/28/2018 10:46:00 PM Disp. Time Eilene Ghazi Time) Disposition Final User 05/28/2018 10:54:08 PM See PCP within 24 Hours Yes  Larmore, RN, [place] Disagree/Comply Comply [name] Understands Yes PLEASE NOTE: All timestamps contained within this report are represented as [place] Standard Time. CONFIDENTIALTY NOTICE: This fax transmission is intended only for the addressee. It contains information that is legally privileged, confidential or otherwise protected from use or disclosure. If you are not the intended recipient, you are strictly prohibited from reviewing, disclosing, copying using or disseminating any of this information or taking any action in reliance on or regarding this information. If you have received this fax in error, please notify us immediately by telephone so that we can arrange for its return to [place]. Phone: [phone], Toll-Free: [phone], Fax: [phone] Page: 2 of 2 Call Id: [id number] PreDisposition [place] Advice Given Per Guideline SEE PCP WITHIN 24 HOURS: * IF OFFICE WILL BE OPEN: You need to be seen within the next 24 hours. Call your doctor (or NP/PA) when the office opens and make an appointment. SAMPLE: Bring in a sample of the bloody urine. Keep it in the refrigerator until you leave. CALL BACK IF: * Fever occurs * You become worse. CARE ADVICE given per Urine, Blood In (Adult) guideline. Referrals REFERRED TO PCP OFFICE REFERRED TO PCP OFFICE

## 2018-05-29 NOTE — Progress Notes (Signed)
HPI  Pt presents to the clinic today with c/o urinary frequency, dysuria, blood in her urine bladder pressure.  She reports this started yesterday.  She denies urgency, vaginal discharge, irritation or odor.  She denies fever, chills or body aches.  She has not taken anything over-the-counter for her symptoms.  She reports she has a history of interstitial cystitis and does follow-up with urology next month.  She does go to urological physical therapy for scar tissue in her pelvic region.   Review of Systems  Past Medical History:  Diagnosis Date  . Anxiety   . Arthritis   . Asthma   . Chicken pox   . Chronic headaches   . Diverticulitis   . Essential hypertension   . GERD (gastroesophageal reflux disease)   . Guillain Barr syndrome ([place]) 1982  . H/O breast implant   . Hypothyroidism   . Interstitial cystitis   . Mitral prolapse 1985  . Nutcracker esophagus     Family History  Problem Relation Age of Onset  . Hypertension Mother   . GER disease Mother   . Polymyalgia rheumatica Mother   . Arthritis Mother   . GER disease Father     Social History   Socioeconomic History  . Marital status: Married    Spouse name: [name]  . Number of children: 2  . Years of education: Some college  . Highest education level: Not on file  Occupational History  . Occupation: Retired  Scientific laboratory technician  . Financial resource strain: Not on file  . Food insecurity:    Worry: Not on file    Inability: Not on file  . Transportation needs:    Medical: Not on file    Non-medical: Not on file  Tobacco Use  . Smoking status: Never Smoker  . Smokeless tobacco: Never Used  Substance and Sexual Activity  . Alcohol use: Yes    Comment: socially  . Drug use: No  . Sexual activity: Not on file  Lifestyle  . Physical activity:    Days per week: Not on file    Minutes per session: Not on file  . Stress: Not on file  Relationships  . Social connections:    Talks on phone: Not on file     Gets together: Not on file    Attends religious service: Not on file    Active member of club or organization: Not on file    Attends meetings of clubs or organizations: Not on file    Relationship status: Not on file  . Intimate partner violence:    Fear of current or ex partner: Not on file    Emotionally abused: Not on file    Physically abused: Not on file    Forced sexual activity: Not on file  Other Topics Concern  . Not on file  Social History Narrative   Lives with husband   Caffeine use: rare   Right handed     Allergies  Allergen Reactions  . Bactrim [Sulfamethoxazole-Trimethoprim] Hives  . Macrobid [Nitrofurantoin Macrocrystal] Hives  . Tizanidine Other (See Comments)    Bad Mood changs  . Vicodin [Hydrocodone-Acetaminophen] Swelling    Puffy eyes and face  . Hydrocodone   . Nexium [Esomeprazole Magnesium]     Face swelling, hives   . Prilosec [Omeprazole]     Face swelling, hives   . Sulfa Antibiotics   . Erythromycin Rash     Constitutional: Denies fever, malaise, fatigue, headache or abrupt weight  changes.   GU: Pt reports bladder pressure, blood in urine, frequency and pain with urination. Denies burning sensation, odor or discharge. Skin: Denies redness, rashes, lesions or ulcercations.   No other specific complaints in a complete review of systems (except as listed in HPI above).    Objective:   Physical Exam  BP 140/82   Pulse 80   Temp 98.2 F (36.8 C) (Oral)   Wt 147 lb (66.7 kg)   SpO2 98%   BMI 26.89 kg/m  Wt Readings from Last 3 Encounters:  05/29/18 147 lb (66.7 kg)  04/24/18 143 lb 12 oz (65.2 kg)  04/14/18 137 lb (62.1 kg)    General: Appears her stated age, well developed, well nourished in NAD. Abdomen: Soft. Normal bowel sounds. No distention or masses noted.  Tender to palpation over the bladder area. No CVA tenderness.        Assessment & Plan:   Bladder pressure, blood in urine, Frequency, Dysuria secondary to  UTI:  Urinalysis: 3+ leuks, 3+ blood Will send urine culture eRx sent if for Keflex 500 mg BID x 5 days OK to take AZO OTC Drink plenty of fluids  RTC as needed or if symptoms persist. Webb Silversmith, NP

## 2018-05-29 NOTE — Telephone Encounter (Signed)
Pt scheduled appt with R Baity NP 05/29/18 at 9:15.

## 2018-05-29 NOTE — Addendum Note (Signed)
Addended by: Lurlean Nanny on: 05/29/2018 09:47 AM   Modules accepted: Orders

## 2018-05-29 NOTE — Patient Instructions (Signed)
Urinary Tract Infection, Adult A urinary tract infection (UTI) is an infection of any part of the urinary tract. The urinary tract includes:  The kidneys.  The ureters.  The bladder.  The urethra. These organs make, store, and get rid of pee (urine) in the body. What are the causes? This is caused by germs (bacteria) in your genital area. These germs grow and cause swelling (inflammation) of your urinary tract. What increases the risk? You are more likely to develop this condition if:  You have a small, thin tube (catheter) to drain pee.  You cannot control when you pee or poop (incontinence).  You are female, and: ? You use these methods to prevent pregnancy: ? A medicine that kills sperm (spermicide). ? A device that blocks sperm (diaphragm). ? You have low levels of a female hormone (estrogen). ? You are pregnant.  You have genes that add to your risk.  You are sexually active.  You take antibiotic medicines.  You have trouble peeing because of: ? A prostate that is bigger than normal, if you are female. ? A blockage in the part of your body that drains pee from the bladder (urethra). ? A kidney stone. ? A nerve condition that affects your bladder (neurogenic bladder). ? Not getting enough to drink. ? Not peeing often enough.  You have other conditions, such as: ? Diabetes. ? A weak disease-fighting system (immune system). ? Sickle cell disease. ? Gout. ? Injury of the spine. What are the signs or symptoms? Symptoms of this condition include:  Needing to pee right away (urgently).  Peeing often.  Peeing small amounts often.  Pain or burning when peeing.  Blood in the pee.  Pee that smells bad or not like normal.  Trouble peeing.  Pee that is cloudy.  Fluid coming from the vagina, if you are female.  Pain in the belly or lower back. Other symptoms include:  Throwing up (vomiting).  No urge to eat.  Feeling mixed up (confused).  Being tired  and grouchy (irritable).  A fever.  Watery poop (diarrhea). How is this treated? This condition may be treated with:  Antibiotic medicine.  Other medicines.  Drinking enough water. Follow these instructions at home:  Medicines  Take over-the-counter and prescription medicines only as told by your doctor.  If you were prescribed an antibiotic medicine, take it as told by your doctor. Do not stop taking it even if you start to feel better. General instructions  Make sure you: ? Pee until your bladder is empty. ? Do not hold pee for a long time. ? Empty your bladder after sex. ? Wipe from front to back after pooping if you are a female. Use each tissue one time when you wipe.  Drink enough fluid to keep your pee pale yellow.  Keep all follow-up visits as told by your doctor. This is important. Contact a doctor if:  You do not get better after 1-2 days.  Your symptoms go away and then come back. Get help right away if:  You have very bad back pain.  You have very bad pain in your lower belly.  You have a fever.  You are sick to your stomach (nauseous).  You are throwing up. Summary  A urinary tract infection (UTI) is an infection of any part of the urinary tract.  This condition is caused by germs in your genital area.  There are many risk factors for a UTI. These include having a small, thin   tube to drain pee and not being able to control when you pee or poop.  Treatment includes antibiotic medicines for germs.  Drink enough fluid to keep your pee pale yellow. This information is not intended to replace advice given to you by your health care provider. Make sure you discuss any questions you have with your health care provider. Document Released: 10/17/2007 Document Revised: 11/07/2017 Document Reviewed: 11/07/2017 [employer] Interactive Patient Education  2019 [employer]  

## 2018-05-31 LAB — URINE CULTURE
MICRO NUMBER:: 65289
SPECIMEN QUALITY:: ADEQUATE

## 2018-06-11 ENCOUNTER — Other Ambulatory Visit: Payer: Self-pay | Admitting: Primary Care

## 2018-06-11 DIAGNOSIS — K219 Gastro-esophageal reflux disease without esophagitis: Secondary | ICD-10-CM

## 2018-06-19 ENCOUNTER — Encounter: Payer: Self-pay | Admitting: Primary Care

## 2018-06-21 ENCOUNTER — Other Ambulatory Visit: Payer: Self-pay | Admitting: Primary Care

## 2018-06-21 DIAGNOSIS — K224 Dyskinesia of esophagus: Secondary | ICD-10-CM

## 2018-06-23 ENCOUNTER — Ambulatory Visit (INDEPENDENT_AMBULATORY_CARE_PROVIDER_SITE_OTHER): Payer: 59 | Admitting: Psychiatry

## 2018-06-23 DIAGNOSIS — F064 Anxiety disorder due to known physiological condition: Secondary | ICD-10-CM

## 2018-06-23 DIAGNOSIS — F4310 Post-traumatic stress disorder, unspecified: Secondary | ICD-10-CM | POA: Diagnosis not present

## 2018-06-23 DIAGNOSIS — R413 Other amnesia: Secondary | ICD-10-CM | POA: Diagnosis not present

## 2018-06-24 ENCOUNTER — Encounter (HOSPITAL_COMMUNITY): Payer: Self-pay | Admitting: Psychiatry
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
## 2018-07-02 ENCOUNTER — Ambulatory Visit (INDEPENDENT_AMBULATORY_CARE_PROVIDER_SITE_OTHER): Payer: 59 | Admitting: Psychiatry

## 2018-07-02 ENCOUNTER — Encounter (HOSPITAL_COMMUNITY): Payer: Self-pay | Admitting: Psychiatry

## 2018-07-02 DIAGNOSIS — R413 Other amnesia: Secondary | ICD-10-CM | POA: Diagnosis not present

## 2018-07-02 DIAGNOSIS — F4310 Post-traumatic stress disorder, unspecified: Secondary | ICD-10-CM

## 2018-07-02 DIAGNOSIS — F064 Anxiety disorder due to known physiological condition: Secondary | ICD-10-CM | POA: Diagnosis not present
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
## 2018-07-08 ENCOUNTER — Encounter: Payer: Self-pay | Admitting: Primary Care

## 2018-07-08 ENCOUNTER — Ambulatory Visit: Payer: 59 | Admitting: Primary Care

## 2018-07-08 VITALS — BP 128/80 | HR 103 | Temp 98.7°F | Ht 62.0 in | Wt 151.2 lb

## 2018-07-08 DIAGNOSIS — Z1159 Encounter for screening for other viral diseases: Secondary | ICD-10-CM

## 2018-07-08 DIAGNOSIS — E039 Hypothyroidism, unspecified: Secondary | ICD-10-CM | POA: Diagnosis not present

## 2018-07-08 DIAGNOSIS — R5382 Chronic fatigue, unspecified: Secondary | ICD-10-CM

## 2018-07-08 DIAGNOSIS — R51 Headache: Secondary | ICD-10-CM

## 2018-07-08 DIAGNOSIS — F09 Unspecified mental disorder due to known physiological condition: Secondary | ICD-10-CM

## 2018-07-08 DIAGNOSIS — G8929 Other chronic pain: Secondary | ICD-10-CM

## 2018-07-08 DIAGNOSIS — E2839 Other primary ovarian failure: Secondary | ICD-10-CM

## 2018-07-08 DIAGNOSIS — R519 Headache, unspecified: Secondary | ICD-10-CM

## 2018-07-08 DIAGNOSIS — R200 Anesthesia of skin: Secondary | ICD-10-CM

## 2018-07-08 DIAGNOSIS — R202 Paresthesia of skin: Secondary | ICD-10-CM

## 2018-07-08 DIAGNOSIS — R7303 Prediabetes: Secondary | ICD-10-CM | POA: Diagnosis not present

## 2018-07-08 DIAGNOSIS — J4540 Moderate persistent asthma, uncomplicated: Secondary | ICD-10-CM

## 2018-07-08 DIAGNOSIS — I10 Essential (primary) hypertension: Secondary | ICD-10-CM

## 2018-07-08 LAB — COMPREHENSIVE METABOLIC PANEL
ALT: 9 U/L (ref 0–35)
AST: 15 U/L (ref 0–37)
Albumin: 4.3 g/dL (ref 3.5–5.2)
Alkaline Phosphatase: 76 U/L (ref 39–117)
BUN: 17 mg/dL (ref 6–23)
CO2: 26 mEq/L (ref 19–32)
Calcium: 9.2 mg/dL (ref 8.4–10.5)
Chloride: 101 mEq/L (ref 96–112)
Creatinine, Ser: 1.15 mg/dL (ref 0.40–1.20)
GFR: 47.69 mL/min — ABNORMAL LOW (ref >60.00)
Glucose, Bld: 87 mg/dL (ref 70–99)
Potassium: 4.1 mEq/L (ref 3.5–5.1)
Sodium: 137 mEq/L (ref 135–145)
Total Bilirubin: 0.3 mg/dL (ref 0.2–1.2)
Total Protein: 7.6 g/dL (ref 6.0–8.3)

## 2018-07-08 LAB — CBC
HCT: 37.7 % (ref 36.0–46.0)
Hemoglobin: 12.8 g/dL (ref 12.0–15.0)
MCHC: 33.8 g/dL (ref 30.0–36.0)
MCV: 90.7 fl (ref 78.0–100.0)
Platelets: 241 10*3/uL (ref 150.0–400.0)
RBC: 4.16 Mil/uL (ref 3.87–5.11)
RDW: 14 % (ref 11.5–15.5)
WBC: 7.3 10*3/uL (ref 4.0–10.5)

## 2018-07-08 LAB — VITAMIN B12: Vitamin B-12: 639 pg/mL (ref 211–911)

## 2018-07-08 LAB — TSH: TSH: 1.64 u[IU]/mL (ref 0.35–4.50)

## 2018-07-08 LAB — HEMOGLOBIN A1C: Hgb A1c MFr Bld: 5.6 % (ref 4.6–6.5)

## 2018-07-08 NOTE — Progress Notes (Signed)
Subjective:    Patient ID: Katie Robbins, female    DOB: 22-Nov-1955, 63 y.o.   MRN: [id number]  HPI  Ms. Loflin is a 63 year old female with a history of Asthma, hypertension, hypothyroidism, GERD, chronic headaches, Guillain Barre, osteoarthritis, cognitive disorder who presents today wanting to discuss several chronic symptoms.  1) Numbness/Tingling: Since Fall 2019 she's noticed numbness to the bilateral plantar feet at the ball of the foot that she will occur as soon as she wakes in the morning. Numbness to balls of feet will resolve around 11 am to 12 pm daily, sometimes will return in the afternoon.   She will also notice numbness to bilateral upper extremity numbness. She will wake around 4:30 am when her husband gets up, she will grab his pillow to support her arms.  She will fall back asleep and wake around 8:30 AM with nearly complete resolve of numbness/tingling.  She does experience some arthritic type pain to her bilateral hands which is chronic.  She will also notice intermittent numbness that will radiate from the ball of her feet up to through her lower extremities, upper extremities, and cervical spine. Her full body numbness/tinlging began 2-3 weeks ago. Dizziness, imbalance as well, she did note this to her neurologist during her last visit.  She is following with Neurology, Dr. Jannifer Franklin, with her last visit being on April 14, 2018 for memory changes. She has be seeing a neuropsychologist once weekly for the last several weeks. She has an appointment set up with another psychiatrist in late March 2020 for neurocognitive testing. She does plan on continuing her visits with her main psychiatrist, Dr. Erling Cruz who treats her cognitive disorder with Adderall and anxiety with diazepam. She will be seeing Dr. Jannifer Franklin within the next 1-2 months. She has not mentioned her upper and lower extremity numbness to her neurologist. She does get intermittent dry needling to her neck when needed.   History of cervical spine surgery in 2018.  2) Chronic Headaches: Chronic for years. Located to the bilateral temporal region and mid occipital region. She is not on treatment for chronic headaches, she will occasionally take BC powder with meals with improvement. No improvement with Tylenol. Cannot take other NSAID's due to GI history. Once managed on Tizanidine, Topamax and had no improvement. Overall headaches are tolerable and she does not wish to have treatment.   3) Fatigue/Weakness: Chronic. Increased symptoms over the last Fall 2019. She is compliant to her Symbicort daily as prescribed. She is using her albuterol three times weekly on average. Feels "out of body" at times, doesn't feel herself. Feels somewhat depressed but doesn't feel as though she's depressed on a daily basis. She was once managed on amitriptyline in the past for her interstitial cystitis and did well, was removed eventually given cogitative disorder. She does feel anxious but mostly because she wants to know the cause for her numerous symptoms above. She does take diazepam three times daily several times weekly. She goes to bed around 10 pm, will sleep 9-10 hours on average and will still feel tired the following day.   She is following with GYN and is managed on estradiol 1 mg daily.   BP Readings from Last 3 Encounters:  07/08/18 128/80  05/29/18 140/82  04/24/18 (!) 144/72     Review of Systems  Constitutional: Positive for fatigue.  Respiratory: Negative for cough and shortness of breath.   Cardiovascular: Negative for chest pain.  Musculoskeletal: Positive for arthralgias.  Neurological:  Positive for dizziness and headaches.  Psychiatric/Behavioral: The patient is nervous/anxious.        See HPI       Past Medical History:  Diagnosis Date  . Anxiety   . Arthritis   . Asthma   . Chicken pox   . Chronic headaches   . Diverticulitis   . Essential hypertension   . GERD (gastroesophageal reflux disease)    . Guillain Barr syndrome ([place]) 1982  . H/O breast implant   . Hypothyroidism   . Interstitial cystitis   . Mitral prolapse 1985  . Nutcracker esophagus      Social History   Socioeconomic History  . Marital status: Married    Spouse name: [name]  . Number of children: 2  . Years of education: Some college  . Highest education level: Not on file  Occupational History  . Occupation: Retired  Scientific laboratory technician  . Financial resource strain: Not on file  . Food insecurity:    Worry: Not on file    Inability: Not on file  . Transportation needs:    Medical: Not on file    Non-medical: Not on file  Tobacco Use  . Smoking status: Never Smoker  . Smokeless tobacco: Never Used  Substance and Sexual Activity  . Alcohol use: Yes    Comment: socially  . Drug use: No  . Sexual activity: Not on file  Lifestyle  . Physical activity:    Days per week: Not on file    Minutes per session: Not on file  . Stress: Not on file  Relationships  . Social connections:    Talks on phone: Not on file    Gets together: Not on file    Attends religious service: Not on file    Active member of club or organization: Not on file    Attends meetings of clubs or organizations: Not on file    Relationship status: Not on file  . Intimate partner violence:    Fear of current or ex partner: Not on file    Emotionally abused: Not on file    Physically abused: Not on file    Forced sexual activity: Not on file  Other Topics Concern  . Not on file  Social History Narrative   Lives with husband   Caffeine use: rare   Right handed     Past Surgical History:  Procedure Laterality Date  . ABDOMINAL HYSTERECTOMY  2000/2009  . APPENDECTOMY  2007  . BOWEL RESECTION  2007  . CARDIAC CATHETERIZATION  2016   Clean  . CARPAL TUNNEL RELEASE Right   . CHOLECYSTECTOMY  2012  . CHOLECYSTECTOMY  2012  . NECK SURGERY  06/11/2016   plates and screws in neck   . SMALL INTESTINE SURGERY  2007   Bowel  resection  . TONSILLECTOMY    . VESICO-VAGINAL FISTULA REPAIR  2007    Family History  Problem Relation Age of Onset  . Hypertension Mother   . GER disease Mother   . Polymyalgia rheumatica Mother   . Arthritis Mother   . GER disease Father     Allergies  Allergen Reactions  . Bactrim [Sulfamethoxazole-Trimethoprim] Hives  . Macrobid [Nitrofurantoin Macrocrystal] Hives  . Tizanidine Other (See Comments)    Bad Mood changs  . Vicodin [Hydrocodone-Acetaminophen] Swelling    Puffy eyes and face  . Hydrocodone   . Nexium [Esomeprazole Magnesium]     Face swelling, hives   . Prilosec [Omeprazole]  Face swelling, hives   . Sulfa Antibiotics   . Erythromycin Rash    Current Outpatient Medications on File Prior to Visit  Medication Sig Dispense Refill  . albuterol (PROAIR HFA) 108 (90 Base) MCG/ACT inhaler Inhale 2 puffs into the lungs every 6 (six) hours as needed for wheezing or shortness of breath. 1 Inhaler 5  . amLODipine (NORVASC) 5 MG tablet TAKE 1 TABLET BY MOUTH ONCE DAILY 90 tablet 1  . amphetamine-dextroamphetamine (ADDERALL) 20 MG tablet Taking 1/2 to 1 tablet 4 times a day needed    . BD PEN NEEDLE NANO U/F 32G X 4 MM MISC USE ONE  4 TIMES DAILY WITH  INSULIN  PEN 300 each 1  . cyanocobalamin (,VITAMIN B-12,) 1000 MCG/ML injection INJECT 1 VIAL ( 1ML) INTO THE MUSCLE EVERY 3 TO 4 WEEKS 24 mL 1  . diazepam (VALIUM) 5 MG tablet Take 5 mg by mouth every 6 (six) hours as needed.     Marland Kitchen estradiol (ESTRACE) 1 MG tablet Take 1 mg by mouth daily.    Marland Kitchen levothyroxine (SYNTHROID, LEVOTHROID) 112 MCG tablet TAKE 1 TABLET BY MOUTH ONCE A DAY 90 tablet 3  . magic mouthwash SOLN Take 15 mLs by mouth 3 (three) times daily as needed for mouth pain (Modified Magic Mouthwash). 360 mL 0  . olmesartan (BENICAR) 20 MG tablet TAKE 1 TABLET BY MOUTH TWICE (2) DAILY 180 tablet 3  . pantoprazole (PROTONIX) 40 MG tablet TAKE 1 TABLET BY MOUTH TWICE A DAY 60 tablet 2  . SYMBICORT 160-4.5  MCG/ACT inhaler INHALE 2 PUFFS INTO THE LUNGS TWICE DAILY 10.2 g 5   No current facility-administered medications on file prior to visit.     BP 128/80   Pulse (!) 103   Temp 98.7 F (37.1 C) (Oral)   Ht 5\' 2"  (1.575 m)   Wt 151 lb 4 oz (68.6 kg)   SpO2 98%   BMI 27.66 kg/m    Objective:   Physical Exam  Constitutional: She is oriented to person, place, and time. She appears well-nourished.  Eyes: EOM are normal.  Neck: Neck supple.  Cardiovascular: Normal rate and regular rhythm.  Respiratory: Effort normal and breath sounds normal.  Neurological: She is alert and oriented to person, place, and time. No cranial nerve deficit. Coordination normal.  Skin: Skin is warm and dry.  Psychiatric: She has a normal mood and affect.           Assessment & Plan:
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
## 2018-07-08 NOTE — Patient Instructions (Addendum)
Stop by the lab prior to leaving today. I will notify you of your results once received.   I will be in touch with Dr. Jannifer Franklin regarding our visit today. I will be in touch with you via My Chart once I hear back.  It was a pleasure to see you today!

## 2018-07-08 NOTE — Assessment & Plan Note (Signed)
Well-controlled on Symbicort, compliant daily.  Using albuterol appropriately, no recent overuse. Continue current regimen.

## 2018-07-08 NOTE — Assessment & Plan Note (Signed)
Using [facility] powder infrequently with resolved. Declines further evaluation/treatment. Continue to monitor.

## 2018-07-08 NOTE — Assessment & Plan Note (Signed)
Repeat TSH pending. She is taking levothyroxine appropriately.

## 2018-07-08 NOTE — Assessment & Plan Note (Signed)
At goal in the office today, continue current regimen.

## 2018-07-08 NOTE — Assessment & Plan Note (Signed)
Undergoing neuro psychology evaluation weekly, will also see neuropsychiatrist next month. Unsure if some of her symptoms are related. Discussed today that diazepam is not first-line treatment for recurrent anxiety, she will discuss this with her psychiatrist. Recommend she do try to come off of the Adderall if possible as this could be contributing to some symptoms.  She will continue to follow-up with her main psychiatrist who does provide this medication.

## 2018-07-08 NOTE — Assessment & Plan Note (Signed)
Repeat A1c pending. 

## 2018-07-08 NOTE — Assessment & Plan Note (Signed)
Ongoing, likely contributing to some symptoms. Did recommend regular exercise, healthy diet. Check labs today including TSH, CBC, B12.

## 2018-07-08 NOTE — Assessment & Plan Note (Signed)
Follows with GYN, compliant to estradiol as prescribed. She denies GYN complaints.

## 2018-07-09 ENCOUNTER — Telehealth: Payer: Self-pay

## 2018-07-09 ENCOUNTER — Ambulatory Visit (INDEPENDENT_AMBULATORY_CARE_PROVIDER_SITE_OTHER): Payer: 59 | Admitting: Psychiatry

## 2018-07-09 DIAGNOSIS — F064 Anxiety disorder due to known physiological condition: Secondary | ICD-10-CM | POA: Diagnosis not present

## 2018-07-09 DIAGNOSIS — F4310 Post-traumatic stress disorder, unspecified: Secondary | ICD-10-CM

## 2018-07-09 LAB — HEPATITIS C ANTIBODY
Hepatitis C Ab: NONREACTIVE
SIGNAL TO CUT-OFF: 0.02 (ref <1.00)

## 2018-07-09 NOTE — Telephone Encounter (Signed)
Patient returned call and accepted follow up appt for 07/23/18 at 2 pm check in time of 1:30 pm.

## 2018-07-09 NOTE — Telephone Encounter (Signed)
I contacted the patient and left a vm advising for her to call back so we can get her worked in for a revisit.

## 2018-07-09 NOTE — Telephone Encounter (Signed)
-----   Message from Kathrynn Ducking, MD sent at 07/08/2018  5:44 PM EST ----- Will need a revisit with this patient sometime in the next several weeks, nonurgent.

## 2018-07-10 ENCOUNTER — Encounter (HOSPITAL_COMMUNITY): Payer: Self-pay | Admitting: Psychiatry
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
## 2018-07-16 ENCOUNTER — Encounter (HOSPITAL_COMMUNITY): Payer: Self-pay | Admitting: Psychiatry

## 2018-07-16 ENCOUNTER — Ambulatory Visit (INDEPENDENT_AMBULATORY_CARE_PROVIDER_SITE_OTHER): Payer: 59 | Admitting: Psychiatry

## 2018-07-16 DIAGNOSIS — R413 Other amnesia: Secondary | ICD-10-CM

## 2018-07-16 DIAGNOSIS — F4310 Post-traumatic stress disorder, unspecified: Secondary | ICD-10-CM | POA: Diagnosis not present

## 2018-07-16 DIAGNOSIS — F064 Anxiety disorder due to known physiological condition: Secondary | ICD-10-CM
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
## 2018-07-22 DIAGNOSIS — M546 Pain in thoracic spine: Secondary | ICD-10-CM

## 2018-07-22 DIAGNOSIS — G629 Polyneuropathy, unspecified: Secondary | ICD-10-CM | POA: Insufficient documentation

## 2018-07-22 HISTORY — DX: Pain in thoracic spine: M54.6

## 2018-07-23 ENCOUNTER — Encounter (HOSPITAL_COMMUNITY): Payer: Self-pay | Admitting: Psychiatry

## 2018-07-23 ENCOUNTER — Ambulatory Visit: Payer: 59 | Admitting: Neurology

## 2018-07-23 ENCOUNTER — Other Ambulatory Visit: Payer: Self-pay

## 2018-07-23 ENCOUNTER — Ambulatory Visit (INDEPENDENT_AMBULATORY_CARE_PROVIDER_SITE_OTHER): Payer: 59 | Admitting: Psychiatry

## 2018-07-23 ENCOUNTER — Encounter: Payer: Self-pay | Admitting: Neurology

## 2018-07-23 VITALS — BP 147/79 | HR 92 | Ht 62.0 in | Wt 150.0 lb

## 2018-07-23 DIAGNOSIS — R413 Other amnesia: Secondary | ICD-10-CM

## 2018-07-23 DIAGNOSIS — F09 Unspecified mental disorder due to known physiological condition: Secondary | ICD-10-CM

## 2018-07-23 DIAGNOSIS — F064 Anxiety disorder due to known physiological condition: Secondary | ICD-10-CM | POA: Diagnosis not present

## 2018-07-23 DIAGNOSIS — F4310 Post-traumatic stress disorder, unspecified: Secondary | ICD-10-CM | POA: Diagnosis not present

## 2018-07-23 DIAGNOSIS — R202 Paresthesia of skin: Secondary | ICD-10-CM | POA: Diagnosis not present
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
## 2018-07-23 NOTE — Progress Notes (Signed)
Reason for visit: Numbness, gait instability  Katie Robbins is an 63 y.o. female  History of present illness:  Katie Robbins is a 63 year old right-handed white female with a history of a memory disturbance, the patient will be seen and evaluated to a neuropsychologist in the near future for this.  The patient has been seen and followed by Katie Robbins, he recently saw the patient and has ordered MRI evaluation of the spine and has ordered EMG nerve conduction study.  The patient reports some problems with numbness and gait instability since September 2019.  The patient indicates that she will have significant pain in the feet when she bears weight.  She reports at times that the numbness will start in the feet and gradually spread up the legs go over the body and down the arms on both sides, the numbness in the feet is persistent but the numbness in the hands and arms is intermittent.  She has some neck pain and pain off to the left side of the shoulder and headaches, from the back of the head.  The patient is not sleeping well, her husband indicates that she has become more moody with severe mood swings and withdrawal.  The patient also reports some episodes of dizziness.  Given the sensory changes in gait instability problems, the patient was sent to this office for an evaluation.  Past Medical History:  Diagnosis Date  . Anxiety   . Arthritis   . Asthma   . Chicken pox   . Chronic headaches   . Diverticulitis   . Essential hypertension   . GERD (gastroesophageal reflux disease)   . Guillain Barr syndrome ([place]) 1982  . H/O breast implant   . Hypothyroidism   . Interstitial cystitis   . Mitral prolapse 1985  . [place]cracker esophagus     Past Surgical History:  Procedure Laterality Date  . ABDOMINAL HYSTERECTOMY  2000/2009  . APPENDECTOMY  2007  . BOWEL RESECTION  2007  . CARDIAC CATHETERIZATION  2016   Clean  . CARPAL TUNNEL RELEASE Right   . CHOLECYSTECTOMY  2012  .  CHOLECYSTECTOMY  2012  . NECK SURGERY  06/11/2016   plates and screws in neck   . SMALL INTESTINE SURGERY  2007   Bowel resection  . TONSILLECTOMY    . VESICO-VAGINAL FISTULA REPAIR  2007    Family History  Problem Relation Age of Onset  . Hypertension Mother   . GER disease Mother   . Polymyalgia rheumatica Mother   . Arthritis Mother   . GER disease Father     Social history:  reports that she has never smoked. She has never used smokeless tobacco. She reports current alcohol use. She reports that she does not use drugs.    Allergies  Allergen Reactions  . Bactrim [Sulfamethoxazole-Trimethoprim] Hives  . Macrobid [Nitrofurantoin Macrocrystal] Hives  . Tizanidine Other (See Comments)    Bad Mood changs  . Vicodin [Hydrocodone-Acetaminophen] Swelling    Puffy eyes and face  . Hydrocodone   . Nexium [Esomeprazole Magnesium]     Face swelling, hives   . Prilosec [Omeprazole]     Face swelling, hives   . Sulfa Antibiotics   . Erythromycin Rash    Medications:  Prior to Admission medications   Medication Sig Start Date End Date Taking? Authorizing Provider  albuterol (PROAIR HFA) 108 (90 Base) MCG/ACT inhaler Inhale 2 puffs into the lungs every 6 (six) hours as needed for wheezing or shortness of  breath. 04/17/17  Yes Pleas Koch, NP  amLODipine (NORVASC) 5 MG tablet TAKE 1 TABLET BY MOUTH ONCE DAILY 06/23/18  Yes Pleas Koch, NP  amphetamine-dextroamphetamine (ADDERALL) 20 MG tablet Taking 1/2 to 1 tablet 4 times a day needed 07/28/15  Yes [provider]  BD PEN NEEDLE NANO U/F 32G X 4 MM MISC USE ONE  4 TIMES DAILY WITH  INSULIN  PEN 04/08/17  Yes Pleas Koch, NP  cyanocobalamin (,VITAMIN B-12,) 1000 MCG/ML injection INJECT 1 VIAL ( 1ML) INTO THE MUSCLE EVERY 3 TO 4 WEEKS 03/07/18  Yes Pleas Koch, NP  diazepam (VALIUM) 5 MG tablet Take 5 mg by mouth every 6 (six) hours as needed.  07/11/16  Yes [provider]  estradiol  (ESTRACE) 1 MG tablet Take 1 mg by mouth daily.   Yes [provider]  levothyroxine (SYNTHROID, LEVOTHROID) 112 MCG tablet TAKE 1 TABLET BY MOUTH ONCE A DAY 12/11/17  Yes Pleas Koch, NP  magic mouthwash SOLN Take 15 mLs by mouth 3 (three) times daily as needed for mouth pain (Modified Magic Mouthwash). 01/08/18  Yes Pleas Koch, NP  olmesartan (BENICAR) 20 MG tablet TAKE 1 TABLET BY MOUTH TWICE (2) DAILY 02/05/18  Yes Pleas Koch, NP  pantoprazole (PROTONIX) 40 MG tablet TAKE 1 TABLET BY MOUTH TWICE A DAY 06/13/18  Yes Pleas Koch, NP  SYMBICORT 160-4.5 MCG/ACT inhaler INHALE 2 PUFFS INTO THE LUNGS TWICE DAILY 02/05/18  Yes Pleas Koch, NP    ROS:  Out of a complete 14 system review of symptoms, the patient complains only of the following symptoms, and all other reviewed systems are negative.  Fatigue Ringing in the ears, difficulty swallowing Blurred vision Shortness of breath, chest tightness Joint pain, joint swelling, back pain, aching muscles, muscle cramps, walking difficulty, neck pain, neck stiffness Memory loss, dizziness, headache, numbness, weakness Bruising easily Agitation, behavior problem, confusion, decreased concentration, depression, anxiety  Blood pressure (!) 147/79, pulse 92, height 5\' 2"  (1.575 m), weight 150 lb (68 kg).  Physical Exam  General: The patient is alert and cooperative at the time of the examination.  Skin: No significant peripheral edema is noted.   Neurologic Exam  Mental status: The patient is alert and oriented x 3 at the time of the examination. The patient has apparent normal recent and remote memory, with an apparently normal attention span and concentration ability.   Cranial nerves: Facial symmetry is present. Speech is normal, no aphasia or dysarthria is noted. Extraocular movements are full. Visual fields are full.  Motor: The patient has good strength in all 4 extremities.  Sensory  examination: Sensory examination reveals that patient reports complete anesthesia to soft touch, pinprick, and vibration sensation on the arms and legs.  The patient does have vibration sensation on the face, but it is decreased.  Coordination: The patient has good finger-nose-finger and heel-to-shin bilaterally.  Gait and station: The patient has a normal gait. Tandem gait is slightly unsteady. Romberg is negative. No drift is seen.  Reflexes: Deep tendon reflexes are symmetric, and are quite normal, ankle jerk reflexes are well-maintained.   Assessment/Plan:  1.  Bilateral foot pain with weightbearing  2.  Total body numbness  3.  Reports of gait instability  4.  Memory disturbance  The patient indicates that she has pain with weightbearing of the feet, she may have a structural abnormality such as plantar fasciitis or Morton's neuroma in the feet.  The  patient reports numbness that goes throughout the entire body, the clinical examination indicates that she reports anesthesia to pinprick, soft touch, and vibration sensation on the arms and legs suggesting a psychogenic issue.  Her husband also reports significant changes in her mood and a tendency for social withdrawal suggesting increased problems with depression.  The patient is to be seen through a neuropsychologist in the near future.  Katie Robbins will be getting MRI of the spine and EMG nerve conduction study.  Blood work will be checked today.  The clinical examination does not support the presence of a significant peripheral neuropathy.  Jill Alexanders MD 07/23/2018 2:00 PM  [facility] [address] [place], [place] [address]  Phone [phone] Fax [phone]

## 2018-07-25 DIAGNOSIS — N183 Chronic kidney disease, stage 3 unspecified: Secondary | ICD-10-CM

## 2018-07-25 LAB — ANGIOTENSIN CONVERTING ENZYME: Angio Convert Enzyme: 49 U/L (ref 14–82)

## 2018-07-25 LAB — COPPER, SERUM: Copper: 189 ug/dL — ABNORMAL HIGH (ref 72–166)

## 2018-07-25 LAB — B. BURGDORFI ANTIBODIES: Lyme IgG/IgM Ab: <0.91 {ISR} (ref 0.00–0.90)

## 2018-07-25 LAB — RPR: RPR Ser Ql: NONREACTIVE

## 2018-07-25 LAB — RHEUMATOID FACTOR: Rheumatoid fact SerPl-aCnc: 10.5 IU/mL (ref 0.0–13.9)

## 2018-07-25 LAB — ANA W/REFLEX: Anti Nuclear Antibody(ANA): NEGATIVE

## 2018-07-25 LAB — SEDIMENTATION RATE: Sed Rate: 5 mm/hr (ref 0–40)

## 2018-07-30 ENCOUNTER — Other Ambulatory Visit: Payer: Self-pay

## 2018-07-30 ENCOUNTER — Encounter (HOSPITAL_COMMUNITY): Payer: Self-pay | Admitting: Psychiatry

## 2018-07-30 ENCOUNTER — Ambulatory Visit (INDEPENDENT_AMBULATORY_CARE_PROVIDER_SITE_OTHER): Payer: 59 | Admitting: Psychiatry

## 2018-07-30 DIAGNOSIS — F4310 Post-traumatic stress disorder, unspecified: Secondary | ICD-10-CM | POA: Diagnosis not present

## 2018-07-30 DIAGNOSIS — R413 Other amnesia: Secondary | ICD-10-CM

## 2018-07-30 DIAGNOSIS — F064 Anxiety disorder due to known physiological condition: Secondary | ICD-10-CM | POA: Diagnosis not present
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
## 2018-08-06 ENCOUNTER — Other Ambulatory Visit: Payer: Self-pay

## 2018-08-06 ENCOUNTER — Ambulatory Visit (INDEPENDENT_AMBULATORY_CARE_PROVIDER_SITE_OTHER): Payer: 59 | Admitting: Psychiatry

## 2018-08-06 ENCOUNTER — Encounter (HOSPITAL_COMMUNITY): Payer: Self-pay | Admitting: Psychiatry

## 2018-08-06 DIAGNOSIS — F4310 Post-traumatic stress disorder, unspecified: Secondary | ICD-10-CM

## 2018-08-06 DIAGNOSIS — F064 Anxiety disorder due to known physiological condition: Secondary | ICD-10-CM | POA: Diagnosis not present

## 2018-08-06 DIAGNOSIS — R413 Other amnesia: Secondary | ICD-10-CM | POA: Diagnosis not present

## 2018-08-06 NOTE — Progress Notes (Signed)
Virtual Visit via Telephone Note  I connected with Katie Robbins on 08/06/18 at 12:30 PM EDT by telephone and verified that I am speaking with the correct person using two identifiers.   I discussed the limitations, risks, security and privacy concerns of performing an evaluation and management service by telephone and the availability of in person appointments. I also discussed with the patient that there may be a patient responsible charge related to this service. The patient expressed understanding and agreed to proceed.   History of Present Illness: Anxiety Disorder due to general condition; Posttraumatic stress disorder; Memory Difficulties    Observations/Objective: Katie Robbins presented as animated, hopeful and optimistic today, in addition to expressing frustrations about medical condition. She reports appreciating the ability to communicate over the phone due to compromised immune system. Objective is to learn healthier coping skills to managing anger experienced by complications with medical concerns.   Assessment and Plan: Counselor explored mood and feelings around medical concerns. Counselor praised Katie Robbins for her ability to seek out information and answers. Katie Robbins shared connection and appreciation with her husband. Counselor provided psychoeducation on anxiety and depression. Counselor provided materials on anger management and challenged her and her husband to look over in between sessions.  Follow Up Instructions: Follow up with specialist tomorrow to share medical concerns and hear test results. Read information on anger management. Continue following care plan.    I discussed the assessment and treatment plan with the patient. The patient was provided an opportunity to ask questions and all were answered. The patient agreed with the plan and demonstrated an understanding of the instructions.   The patient was advised to call back or seek an in-person evaluation if the symptoms worsen  or if the condition fails to improve as anticipated.  I provided 60 minutes of non-face-to-face time during this encounter.   Katie Auer, LCSW

## 2018-08-07 ENCOUNTER — Other Ambulatory Visit: Payer: Self-pay

## 2018-08-07 ENCOUNTER — Encounter (HOSPITAL_COMMUNITY): Payer: Self-pay | Admitting: Psychiatry

## 2018-08-07 ENCOUNTER — Ambulatory Visit (INDEPENDENT_AMBULATORY_CARE_PROVIDER_SITE_OTHER): Payer: 59 | Admitting: Psychiatry

## 2018-08-07 VITALS — BP 163/92 | HR 92 | Temp 98.1°F | Ht 63.0 in | Wt 150.0 lb

## 2018-08-07 DIAGNOSIS — F0670 Mild neurocognitive disorder due to known physiological condition without behavioral disturbance: Secondary | ICD-10-CM | POA: Insufficient documentation

## 2018-08-07 DIAGNOSIS — IMO0001 Reserved for inherently not codable concepts without codable children: Secondary | ICD-10-CM

## 2018-08-07 DIAGNOSIS — S069XAS Unspecified intracranial injury with loss of consciousness status unknown, sequela: Secondary | ICD-10-CM | POA: Insufficient documentation

## 2018-08-07 DIAGNOSIS — F0630 Mood disorder due to known physiological condition, unspecified: Secondary | ICD-10-CM | POA: Diagnosis not present

## 2018-08-07 DIAGNOSIS — G3184 Mild cognitive impairment, so stated: Secondary | ICD-10-CM

## 2018-08-07 DIAGNOSIS — S069X9S Unspecified intracranial injury with loss of consciousness of unspecified duration, sequela: Secondary | ICD-10-CM | POA: Diagnosis not present
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
## 2018-08-11 ENCOUNTER — Telehealth (HOSPITAL_COMMUNITY): Payer: Self-pay

## 2018-08-11 NOTE — Telephone Encounter (Signed)
Patient is calling asking for a return call, she wants to speak to you about the medication you mentioned. Please call back, thank you

## 2018-08-11 NOTE — Telephone Encounter (Signed)
error 

## 2018-08-13 ENCOUNTER — Other Ambulatory Visit (HOSPITAL_COMMUNITY): Payer: Self-pay | Admitting: Psychiatry

## 2018-08-13 ENCOUNTER — Ambulatory Visit (HOSPITAL_COMMUNITY): Payer: 59 | Admitting: Psychiatry

## 2018-08-13 MED ORDER — LAMOTRIGINE 25 MG PO TABS: ORAL_TABLET | ORAL | 0 refills | Status: DC

## 2018-08-13 MED ORDER — DIAZEPAM 5 MG PO TABS: 5.0000 mg | ORAL_TABLET | Freq: Three times a day (TID) | ORAL | 0 refills | Status: DC | PRN

## 2018-08-13 NOTE — Telephone Encounter (Signed)
Patient would like a call back, they have decided that she would like to start the Lamictal - she will also need refills on her Adderall 20 mg qid and Valium tid prn. Please call patient to discuss, thank you

## 2018-08-14 ENCOUNTER — Other Ambulatory Visit: Payer: Self-pay | Admitting: Primary Care

## 2018-08-14 DIAGNOSIS — K219 Gastro-esophageal reflux disease without esophagitis: Secondary | ICD-10-CM

## 2018-08-20 ENCOUNTER — Other Ambulatory Visit: Payer: Self-pay

## 2018-08-20 ENCOUNTER — Ambulatory Visit (INDEPENDENT_AMBULATORY_CARE_PROVIDER_SITE_OTHER): Payer: 59 | Admitting: Psychiatry

## 2018-08-20 ENCOUNTER — Encounter (HOSPITAL_COMMUNITY): Payer: Self-pay | Admitting: Psychiatry

## 2018-08-20 DIAGNOSIS — R413 Other amnesia: Secondary | ICD-10-CM | POA: Diagnosis not present

## 2018-08-20 DIAGNOSIS — F4310 Post-traumatic stress disorder, unspecified: Secondary | ICD-10-CM

## 2018-08-20 DIAGNOSIS — F064 Anxiety disorder due to known physiological condition: Secondary | ICD-10-CM | POA: Diagnosis not present
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
## 2018-08-27 ENCOUNTER — Other Ambulatory Visit: Payer: Self-pay | Admitting: Primary Care

## 2018-08-27 DIAGNOSIS — J4540 Moderate persistent asthma, uncomplicated: Secondary | ICD-10-CM

## 2018-09-03 ENCOUNTER — Ambulatory Visit (INDEPENDENT_AMBULATORY_CARE_PROVIDER_SITE_OTHER): Payer: 59 | Admitting: Psychiatry

## 2018-09-03 ENCOUNTER — Encounter (HOSPITAL_COMMUNITY): Payer: Self-pay | Admitting: Psychiatry

## 2018-09-03 ENCOUNTER — Other Ambulatory Visit: Payer: Self-pay

## 2018-09-03 ENCOUNTER — Other Ambulatory Visit (HOSPITAL_COMMUNITY): Payer: Self-pay | Admitting: Psychiatry

## 2018-09-03 DIAGNOSIS — F064 Anxiety disorder due to known physiological condition: Secondary | ICD-10-CM

## 2018-09-03 DIAGNOSIS — R413 Other amnesia: Secondary | ICD-10-CM | POA: Diagnosis not present

## 2018-09-03 DIAGNOSIS — F4310 Post-traumatic stress disorder, unspecified: Secondary | ICD-10-CM | POA: Diagnosis not present
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
## 2018-09-05 IMAGING — DX DG CHEST 2V
2 series · 2 of 2 positions shown · non-contrast
Comparison: 03/09/2016 chest radiograph.

CLINICAL DATA: Chest pain

EXAM:
CHEST - 2 VIEW

[chest pa]
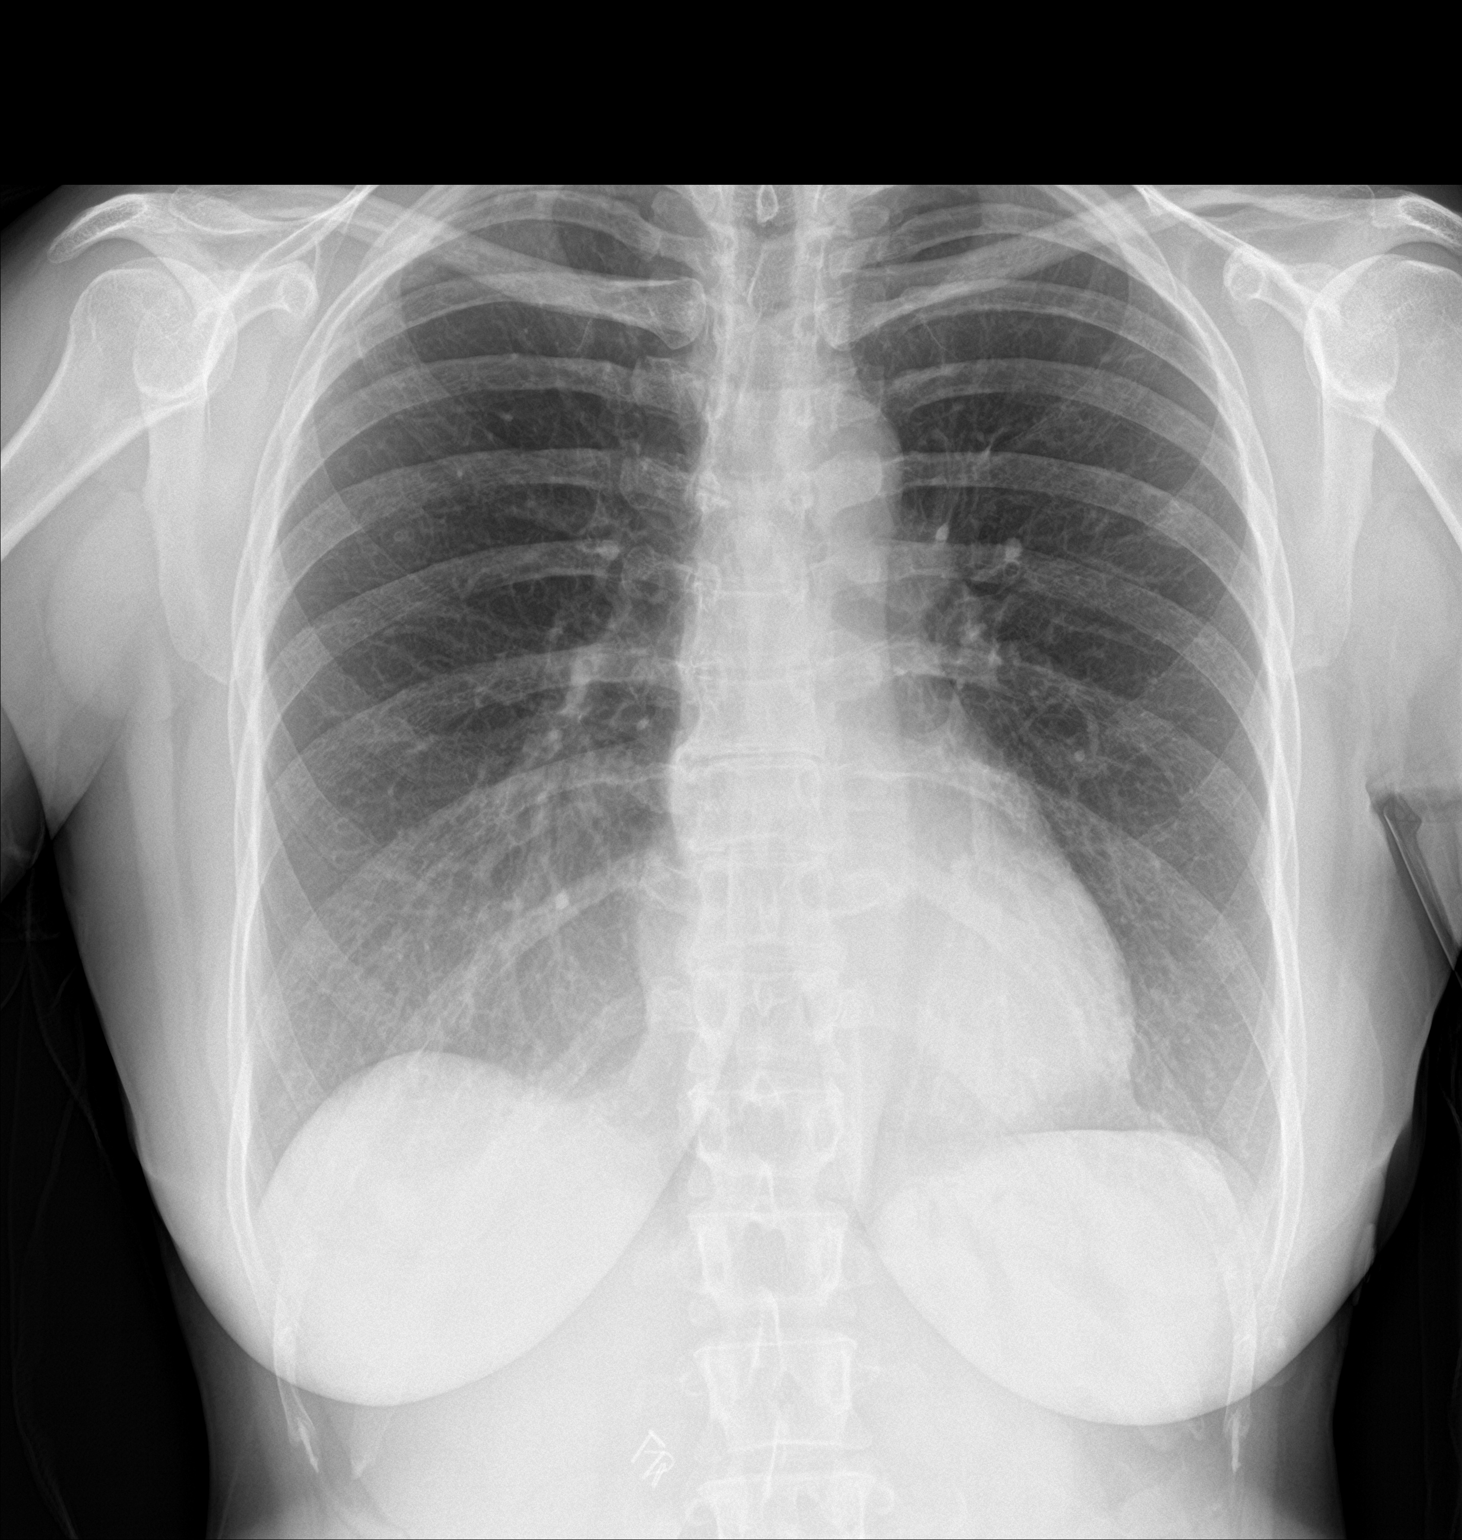

[chest lat]
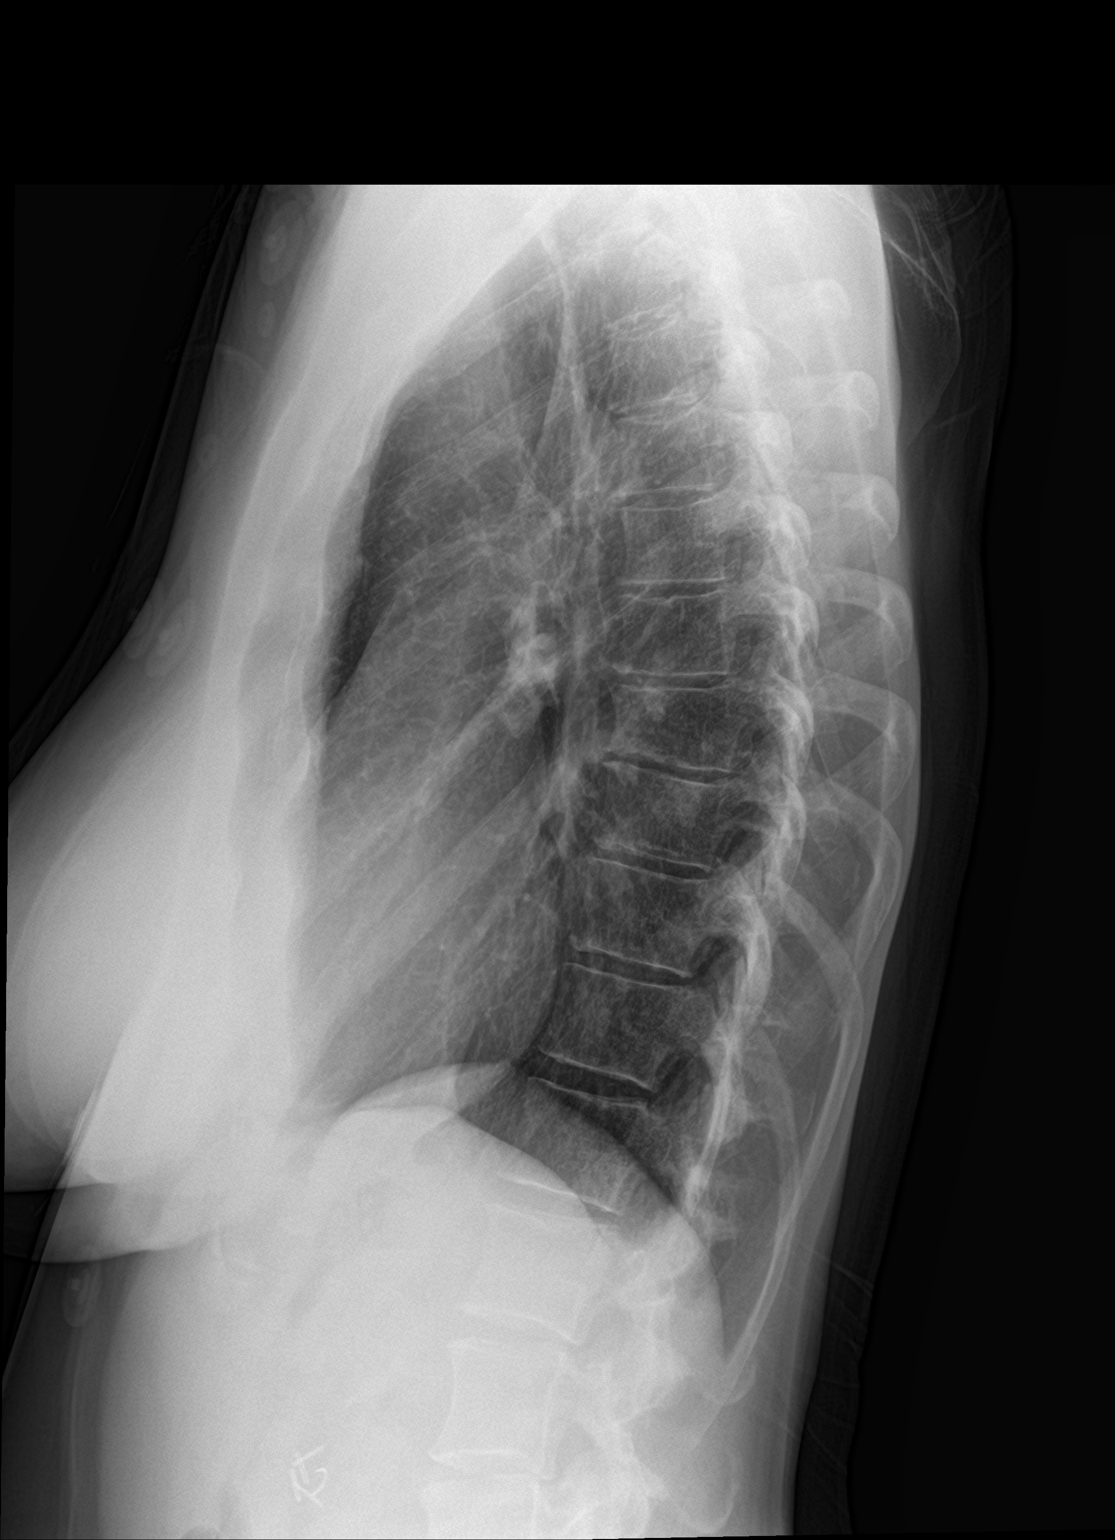

[2 of 2 positions shown; findings below may reference images not displayed]

FINDINGS: Partially visualized surgical hardware from ACDF overlying the lower
cervical spine. Cholecystectomy clips are seen in the right upper
quadrant of the abdomen. Stable cardiomediastinal silhouette with
normal heart size. No pneumothorax. No pleural effusion. Lungs
appear clear, with no acute consolidative airspace disease and no
pulmonary edema.
IMPRESSION: No active cardiopulmonary disease.

## 2018-09-09 ENCOUNTER — Other Ambulatory Visit: Payer: Self-pay

## 2018-09-09 ENCOUNTER — Ambulatory Visit (INDEPENDENT_AMBULATORY_CARE_PROVIDER_SITE_OTHER): Payer: 59 | Admitting: Psychiatry

## 2018-09-09 DIAGNOSIS — F0630 Mood disorder due to known physiological condition, unspecified: Secondary | ICD-10-CM

## 2018-09-09 DIAGNOSIS — S069X9S Unspecified intracranial injury with loss of consciousness of unspecified duration, sequela: Secondary | ICD-10-CM | POA: Diagnosis not present

## 2018-09-09 DIAGNOSIS — G3184 Mild cognitive impairment, so stated: Secondary | ICD-10-CM | POA: Diagnosis not present

## 2018-09-09 DIAGNOSIS — IMO0001 Reserved for inherently not codable concepts without codable children: Secondary | ICD-10-CM

## 2018-09-09 DIAGNOSIS — S069XAS Unspecified intracranial injury with loss of consciousness status unknown, sequela: Secondary | ICD-10-CM

## 2018-09-09 MED ORDER — AMPHETAMINE-DEXTROAMPHETAMINE 20 MG PO TABS: 20.0000 mg | ORAL_TABLET | Freq: Four times a day (QID) | ORAL | 0 refills | Status: DC | PRN

## 2018-09-09 MED ORDER — LAMOTRIGINE 100 MG PO TABS: 100.0000 mg | ORAL_TABLET | Freq: Two times a day (BID) | ORAL | 1 refills | Status: DC

## 2018-09-09 MED ORDER — DIAZEPAM 5 MG PO TABS: 5.0000 mg | ORAL_TABLET | Freq: Three times a day (TID) | ORAL | 0 refills | Status: AC | PRN
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
## 2018-09-12 ENCOUNTER — Telehealth: Payer: Self-pay | Admitting: Primary Care

## 2018-09-12 NOTE — Telephone Encounter (Signed)
Patient would like to see we could send in a prescription for her ADDERALL. She stated she only needed about a week worth of tablets until she could get in contact with the prescribing office.   [place] to patient via Mychart

## 2018-09-12 NOTE — Telephone Encounter (Signed)
Based off of chart review her Adderall was refilled by Dr. Montel Culver on 09/09/18. Will message patient.

## 2018-09-15 ENCOUNTER — Telehealth (HOSPITAL_COMMUNITY): Payer: Self-pay

## 2018-09-15 MED ORDER — AMPHETAMINE-DEXTROAMPHETAMINE 20 MG PO TABS: ORAL_TABLET | ORAL | 0 refills | Status: DC

## 2018-09-15 MED ORDER — AMPHETAMINE-DEXTROAMPHETAMINE 20 MG PO TABS: 20.0000 mg | ORAL_TABLET | Freq: Four times a day (QID) | ORAL | 0 refills | Status: DC | PRN

## 2018-09-15 NOTE — Telephone Encounter (Signed)
This is a [facility] patient, he prescribed her Adderall on 4/28, but it printed. Would you mind resending to [place]? Thank you.

## 2018-09-15 NOTE — Telephone Encounter (Signed)
Done

## 2018-09-15 NOTE — Telephone Encounter (Signed)
rror

## 2018-09-17 ENCOUNTER — Other Ambulatory Visit: Payer: Self-pay

## 2018-09-17 ENCOUNTER — Ambulatory Visit (INDEPENDENT_AMBULATORY_CARE_PROVIDER_SITE_OTHER): Payer: 59 | Admitting: Psychiatry

## 2018-09-17 ENCOUNTER — Encounter (HOSPITAL_COMMUNITY): Payer: Self-pay | Admitting: Psychiatry

## 2018-09-17 DIAGNOSIS — F0630 Mood disorder due to known physiological condition, unspecified: Secondary | ICD-10-CM

## 2018-09-17 DIAGNOSIS — S069X9S Unspecified intracranial injury with loss of consciousness of unspecified duration, sequela: Secondary | ICD-10-CM

## 2018-09-17 DIAGNOSIS — F4310 Post-traumatic stress disorder, unspecified: Secondary | ICD-10-CM

## 2018-09-17 DIAGNOSIS — F064 Anxiety disorder due to known physiological condition: Secondary | ICD-10-CM

## 2018-09-17 DIAGNOSIS — S069XAS Unspecified intracranial injury with loss of consciousness status unknown, sequela: Secondary | ICD-10-CM
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
## 2018-09-24 ENCOUNTER — Telehealth (HOSPITAL_COMMUNITY): Payer: Self-pay

## 2018-09-24 NOTE — Telephone Encounter (Signed)
Try to call her but going into voicemail.  Patient can reduce Lamictal to 100 mg only.  She should also reduce her Adderall to take half tablet twice a day as it can also cause agitation.  Reducing Adderall will help the agitation.  If symptoms do not improve then patient should call back.

## 2018-09-24 NOTE — Telephone Encounter (Signed)
This is a patient of [facility]. Patients husband called and states that patient has become very agitated and angry since going to the full dose of Lamictal (200 mg) Please review and advise, thank you

## 2018-10-01 ENCOUNTER — Other Ambulatory Visit: Payer: Self-pay

## 2018-10-01 ENCOUNTER — Ambulatory Visit (INDEPENDENT_AMBULATORY_CARE_PROVIDER_SITE_OTHER): Payer: 59 | Admitting: Psychiatry

## 2018-10-01 ENCOUNTER — Encounter (HOSPITAL_COMMUNITY): Payer: Self-pay | Admitting: Psychiatry

## 2018-10-01 DIAGNOSIS — F0630 Mood disorder due to known physiological condition, unspecified: Secondary | ICD-10-CM | POA: Diagnosis not present

## 2018-10-01 DIAGNOSIS — F064 Anxiety disorder due to known physiological condition: Secondary | ICD-10-CM

## 2018-10-01 DIAGNOSIS — S069X9S Unspecified intracranial injury with loss of consciousness of unspecified duration, sequela: Secondary | ICD-10-CM

## 2018-10-01 DIAGNOSIS — S069XAS Unspecified intracranial injury with loss of consciousness status unknown, sequela: Secondary | ICD-10-CM

## 2018-10-01 DIAGNOSIS — F4310 Post-traumatic stress disorder, unspecified: Secondary | ICD-10-CM

## 2018-10-01 NOTE — Telephone Encounter (Signed)
I called and left a voicemail for patient with doctor instructions and my number to call back with any questions or concerns
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
## 2018-10-01 NOTE — Telephone Encounter (Signed)
Spoke with patient's husband and he informed me that patient is taking the Lamictal 100mg  as directed. She is taking 50mg  in the morning and 50mg  at night. He stated that seems to help and she appears to be holding steady. He also stated that he is giving patient 3 1/2 doses 70mg  total of the Adderall. He stated the reduction of the Adderall does not help and makes her worse. Patient has an appointment scheduled for 10/16/18 and husband stated that he would like to discuss an antidepressant at that time.

## 2018-10-14 ENCOUNTER — Encounter (HOSPITAL_COMMUNITY): Payer: Self-pay | Admitting: Psychiatry

## 2018-10-14 ENCOUNTER — Ambulatory Visit (INDEPENDENT_AMBULATORY_CARE_PROVIDER_SITE_OTHER): Payer: 59 | Admitting: Psychiatry

## 2018-10-14 ENCOUNTER — Other Ambulatory Visit: Payer: Self-pay

## 2018-10-14 DIAGNOSIS — F0630 Mood disorder due to known physiological condition, unspecified: Secondary | ICD-10-CM | POA: Diagnosis not present

## 2018-10-14 DIAGNOSIS — F064 Anxiety disorder due to known physiological condition: Secondary | ICD-10-CM | POA: Diagnosis not present

## 2018-10-14 DIAGNOSIS — F4310 Post-traumatic stress disorder, unspecified: Secondary | ICD-10-CM

## 2018-10-14 DIAGNOSIS — S069X9S Unspecified intracranial injury with loss of consciousness of unspecified duration, sequela: Secondary | ICD-10-CM | POA: Diagnosis not present

## 2018-10-14 DIAGNOSIS — S069XAS Unspecified intracranial injury with loss of consciousness status unknown, sequela: Secondary | ICD-10-CM

## 2018-10-14 NOTE — Progress Notes (Signed)
Virtual Visit via Video Note  I connected with Katie Robbins on 10/14/18 at 10:00 AM EDT by a video enabled telemedicine application and verified that I am speaking with the correct person using two identifiers.  Location: Patient: Katie Robbins Provider: [name], LCSW   I discussed the limitations of evaluation and management by telemedicine and the availability of in person appointments. The patient expressed understanding and agreed to proceed.  History of Present Illness: Mood disorder as late effect of TBI, Anxiety disorder due to general medical condition and PTSD due to adverse life experiences.    Observations/Objective: Counselor met with Lakaisha for individual therapy via Webex. Counselor assessed MH symptoms and progress on treatment plan goals. Anira denied suicidal ideation or self-harm behaviors. Zilda shared that she was in recovery from carpel tunnel surgery. Counselor and Chelsie discussed how the pain and other physical health issues were impacting her medical health issues. Counselor prompted Damonica to share her thoughts and feelings from her journal entries. Counselor and Alliana identified her triggers, automatic responses and learned the STOP DBT skill. Counselor and Mikaylee discussed medication needs and changes. Loghan would like to talk with her Dr. About changing to an anti-depressant at the next appointment.   Assessment and Plan: Counselor will continue to meet with Marshay to address treatment plan goals. Anely will continue to follow recommendations of providers and implement skills learned in session.  Follow Up Instructions: Counselor will send information for next session via Webex.     I discussed the assessment and treatment plan with the patient. The patient was provided an opportunity to ask questions and all were answered. The patient agreed with the plan and demonstrated an understanding of the instructions.   The patient was advised to call back or seek an  in-person evaluation if the symptoms worsen or if the condition fails to improve as anticipated.  I provided 60 minutes of non-face-to-face time during this encounter.   [name], LCSW

## 2018-10-15 ENCOUNTER — Ambulatory Visit (HOSPITAL_COMMUNITY): Payer: 59 | Admitting: Psychiatry

## 2018-10-16 ENCOUNTER — Ambulatory Visit (INDEPENDENT_AMBULATORY_CARE_PROVIDER_SITE_OTHER): Payer: 59 | Admitting: Psychiatry

## 2018-10-16 ENCOUNTER — Other Ambulatory Visit: Payer: Self-pay

## 2018-10-16 DIAGNOSIS — S069X9D Unspecified intracranial injury with loss of consciousness of unspecified duration, subsequent encounter: Secondary | ICD-10-CM | POA: Diagnosis not present

## 2018-10-16 DIAGNOSIS — S069X9S Unspecified intracranial injury with loss of consciousness of unspecified duration, sequela: Secondary | ICD-10-CM | POA: Diagnosis not present

## 2018-10-16 DIAGNOSIS — F0630 Mood disorder due to known physiological condition, unspecified: Secondary | ICD-10-CM

## 2018-10-16 DIAGNOSIS — G3184 Mild cognitive impairment, so stated: Secondary | ICD-10-CM | POA: Diagnosis not present

## 2018-10-16 DIAGNOSIS — S069XAS Unspecified intracranial injury with loss of consciousness status unknown, sequela: Secondary | ICD-10-CM

## 2018-10-16 DIAGNOSIS — IMO0001 Reserved for inherently not codable concepts without codable children: Secondary | ICD-10-CM

## 2018-10-16 MED ORDER — DIAZEPAM 5 MG PO TABS: 5.0000 mg | ORAL_TABLET | Freq: Three times a day (TID) | ORAL | 1 refills | Status: DC | PRN

## 2018-10-16 MED ORDER — AMPHETAMINE-DEXTROAMPHET ER 30 MG PO CP24: 30.0000 mg | ORAL_CAPSULE | Freq: Two times a day (BID) | ORAL | 0 refills | Status: DC

## 2018-10-16 MED ORDER — OXCARBAZEPINE 150 MG PO TABS: 150.0000 mg | ORAL_TABLET | Freq: Two times a day (BID) | ORAL | 1 refills | Status: DC
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
## 2018-10-23 ENCOUNTER — Telehealth: Payer: Self-pay | Admitting: Primary Care

## 2018-10-23 DIAGNOSIS — Z8739 Personal history of other diseases of the musculoskeletal system and connective tissue: Secondary | ICD-10-CM

## 2018-10-23 NOTE — Telephone Encounter (Signed)
Noted  Referral placed.

## 2018-10-23 NOTE — Telephone Encounter (Signed)
Patient called to request a Referral to [facility] to see Dr Gavin Pound. Best contact number is [phone]

## 2018-10-27 NOTE — Telephone Encounter (Signed)
Referral sent to [facility] to see Dr Trudie Reed.

## 2018-10-29 ENCOUNTER — Ambulatory Visit (INDEPENDENT_AMBULATORY_CARE_PROVIDER_SITE_OTHER): Payer: 59 | Admitting: Primary Care

## 2018-10-29 ENCOUNTER — Ambulatory Visit (INDEPENDENT_AMBULATORY_CARE_PROVIDER_SITE_OTHER): Payer: 59 | Admitting: Psychiatry

## 2018-10-29 ENCOUNTER — Ambulatory Visit: Payer: Self-pay | Admitting: Primary Care

## 2018-10-29 ENCOUNTER — Other Ambulatory Visit: Payer: Self-pay

## 2018-10-29 ENCOUNTER — Encounter: Payer: Self-pay | Admitting: Primary Care

## 2018-10-29 VITALS — Wt 150.0 lb

## 2018-10-29 DIAGNOSIS — R21 Rash and other nonspecific skin eruption: Secondary | ICD-10-CM

## 2018-10-29 DIAGNOSIS — G3184 Mild cognitive impairment, so stated: Secondary | ICD-10-CM

## 2018-10-29 DIAGNOSIS — S069XAS Unspecified intracranial injury with loss of consciousness status unknown, sequela: Secondary | ICD-10-CM

## 2018-10-29 DIAGNOSIS — F064 Anxiety disorder due to known physiological condition: Secondary | ICD-10-CM

## 2018-10-29 DIAGNOSIS — IMO0001 Reserved for inherently not codable concepts without codable children: Secondary | ICD-10-CM

## 2018-10-29 DIAGNOSIS — F0630 Mood disorder due to known physiological condition, unspecified: Secondary | ICD-10-CM

## 2018-10-29 DIAGNOSIS — S069X9D Unspecified intracranial injury with loss of consciousness of unspecified duration, subsequent encounter: Secondary | ICD-10-CM | POA: Diagnosis not present

## 2018-10-29 DIAGNOSIS — F4310 Post-traumatic stress disorder, unspecified: Secondary | ICD-10-CM

## 2018-10-29 DIAGNOSIS — S069X9S Unspecified intracranial injury with loss of consciousness of unspecified duration, sequela: Secondary | ICD-10-CM

## 2018-10-29 MED ORDER — PREDNISONE 20 MG PO TABS: ORAL_TABLET | ORAL | 0 refills | Status: DC

## 2018-10-29 NOTE — Assessment & Plan Note (Signed)
Acute and suspect secondary to oxcarbazepine. Suspect she developed an allergic reaction when she started the medication two weeks ago, but that the prednisone dose pack hid symptoms. Now that she is off the dose pack and has continued with the medication her symptoms prevailed.  Given the wide spread nature of the rash we will treat again with prednisone for 6 days. She will update. Continue off of oxcarbazepine, she has already notified her psychiatrist.

## 2018-10-29 NOTE — Telephone Encounter (Signed)
Patient stated that they are going to be uploading photos of her rash on my chart for you to see before her appt this afternoon .

## 2018-10-29 NOTE — Progress Notes (Signed)
Subjective:    Patient ID: Katie Robbins, female    DOB: Oct 25, 1955, 63 y.o.   MRN: [id number]  HPI  Virtual Visit via Video Note  I connected with Katie Robbins on 10/29/18 at  3:40 PM EDT by a video enabled telemedicine application and verified that I am speaking with the correct person using two identifiers.  Location: Patient: Home Provider: Office   I discussed the limitations of evaluation and management by telemedicine and the availability of in person appointments. The patient expressed understanding and agreed to proceed.  History of Present Illness:  Katie Robbins is a 63 year old female with a history of hypertension, asthma, GERD, chronic arthralgia, Cognitive Disorder, Chronic headaches, Chronic fatigue who presents today with a chief complaint of rash.  She first noticed her rash one week ago to her chest after working out in the yard. Since then her rash has progressed to her anterior and posterior trunk, bilateral upper and lower extremities, and hands. Two weeks ago she was initiated on a prednisone dose pack for her arthritis for which she completed one week ago. She started oxcarbazepine from her psychiatrist two weeks ago, the same time she started the prednisone dose pack. Since she noticed her rash two days ago she stopped taking her oxcarbazepine. Her rash is not itchy or painful. She's not taken anything OTC. Her rash is about the same as it was two days ago, no worse and no better.   She denies changes in soaps, detergents, food. The only change was oxcarbazepine. She denies shortness of breath, wheezing, throat tightness.    Observations/Objective:  Alert and oriented. Appears well, not sickly. No distress. Speaking in complete sentences. See PE.  Assessment and Plan:  See problem based charting.  Follow Up Instructions:  Continue off of oxcarbazepine medication.  Start prednisone for rash. Take 2 tablets for 3 days then 1 tablet for 3 days.  Please  update me in a few days as discussed.  It was a pleasure to see you today!    I discussed the assessment and treatment plan with the patient. The patient was provided an opportunity to ask questions and all were answered. The patient agreed with the plan and demonstrated an understanding of the instructions.   The patient was advised to call back or seek an in-person evaluation if the symptoms worsen or if the condition fails to improve as anticipated.     Pleas Koch, NP    Review of Systems  Constitutional: Negative for fever.  HENT: Negative for trouble swallowing.   Respiratory: Negative for shortness of breath.   Skin: Positive for rash.       Past Medical History:  Diagnosis Date   Anxiety    Arthritis    Asthma    Chicken pox    Chronic headaches    Diverticulitis    Essential hypertension    GERD (gastroesophageal reflux disease)    Guillain Barr syndrome ([place]) 1982   H/O breast implant    Hypothyroidism    Interstitial cystitis    Mitral prolapse 1985   Nutcracker esophagus      Social History   Socioeconomic History   Marital status: Married    Spouse name: [name]   Number of children: 2   Years of education: Some college   Highest education level: Not on file  Occupational History   Occupation: Retired  Scientist, product/process development strain: Not on [employer] insecurity  Worry: Not on file    Inability: Not on file   Transportation needs    Medical: Not on file    Non-medical: Not on file  Tobacco Use   Smoking status: Never Smoker   Smokeless tobacco: Never Used  Substance and Sexual Activity   Alcohol use: Yes    Comment: socially   Drug use: No   Sexual activity: Yes  Lifestyle   Physical activity    Days per week: Not on file    Minutes per session: Not on file   Stress: Not on file  Relationships   Social connections    Talks on phone: Not on file    Gets together: Not on file     Attends religious service: Not on file    Active member of club or organization: Not on file    Attends meetings of clubs or organizations: Not on file    Relationship status: Not on file   Intimate partner violence    Fear of current or ex partner: Not on file    Emotionally abused: Not on file    Physically abused: Not on file    Forced sexual activity: Not on file  Other Topics Concern   Not on file  Social History Narrative   Lives with husband   Caffeine use: rare   Right handed     Past Surgical History:  Procedure Laterality Date   ABDOMINAL HYSTERECTOMY  2000/2009   APPENDECTOMY  2007   BOWEL RESECTION  2007   CARDIAC CATHETERIZATION  2016   Clean   CARPAL TUNNEL RELEASE Right    CHOLECYSTECTOMY  2012   CHOLECYSTECTOMY  2012   NECK SURGERY  06/11/2016   plates and screws in neck    SMALL INTESTINE SURGERY  2007   Bowel resection   TONSILLECTOMY     VESICO-VAGINAL FISTULA REPAIR  2007    Family History  Problem Relation Age of Onset   Hypertension Mother    GER disease Mother    Polymyalgia rheumatica Mother    Arthritis Mother    GER disease Father     Allergies  Allergen Reactions   Bactrim [Sulfamethoxazole-Trimethoprim] Hives   Macrobid [Nitrofurantoin Macrocrystal] Hives   Tizanidine Other (See Comments)    Bad Mood changs   Vicodin [Hydrocodone-Acetaminophen] Swelling    Puffy eyes and face   Hydrocodone    Nexium [Esomeprazole Magnesium]     Face swelling, hives    Prilosec [Omeprazole]     Face swelling, hives    Sulfa Antibiotics    Erythromycin Rash    Current Outpatient Medications on File Prior to Visit  Medication Sig Dispense Refill   amLODipine (NORVASC) 5 MG tablet TAKE 1 TABLET BY MOUTH ONCE DAILY 90 tablet 1   amphetamine-dextroamphetamine (ADDERALL XR) 30 MG 24 hr capsule Take 1 capsule (30 mg total) by mouth 2 (two) times a day for 30 days. Take one at 8 AM and one around 2 PM 60 capsule 0   BD  PEN NEEDLE NANO U/F 32G X 4 MM MISC USE ONE  4 TIMES DAILY WITH  INSULIN  PEN 300 each 1   cyanocobalamin (,VITAMIN B-12,) 1000 MCG/ML injection INJECT 1 VIAL ( 1ML) INTO THE MUSCLE EVERY 3 TO 4 WEEKS 24 mL 1   diazepam (VALIUM) 5 MG tablet Take 1 tablet (5 mg total) by mouth every 8 (eight) hours as needed for anxiety. 90 tablet 1   estradiol (ESTRACE) 1 MG tablet Take  1 mg by mouth daily.     levothyroxine (SYNTHROID, LEVOTHROID) 112 MCG tablet TAKE 1 TABLET BY MOUTH ONCE A DAY 90 tablet 3   magic mouthwash SOLN Take 15 mLs by mouth 3 (three) times daily as needed for mouth pain (Modified Magic Mouthwash). 360 mL 0   olmesartan (BENICAR) 20 MG tablet TAKE 1 TABLET BY MOUTH TWICE (2) DAILY 180 tablet 3   OXcarbazepine (TRILEPTAL) 150 MG tablet Take 1 tablet (150 mg total) by mouth 2 (two) times daily. 60 tablet 1   pantoprazole (PROTONIX) 40 MG tablet TAKE 1 TABLET BY MOUTH TWICE A DAY 60 tablet 2   SYMBICORT 160-4.5 MCG/ACT inhaler INHALE 2 PUFFS INTO THE LUNGS TWICE DAILY 10.2 g 5   VENTOLIN HFA 108 (90 Base) MCG/ACT inhaler INHALE 2 PUFFS INTO THE LUNGS EVERY 6 HOURS AS NEEDED FOR WHEEZING ORSHORTNESS OF BREATH 18 g 0   No current facility-administered medications on file prior to visit.     Wt 150 lb (68 kg)    BMI 26.57 kg/m    Objective:   Physical Exam  Constitutional: She is oriented to person, place, and time. She appears well-nourished. She does not have a sickly appearance. She does not appear ill.  Respiratory: Effort normal. No respiratory distress.  Neurological: She is alert and oriented to person, place, and time.  Skin: Rash noted.  Patient sent several pictures via my chart with what appears to be allergic dermatitis. Obvious erythematous rash to extremities and trunk, scaly rash to palm of left hand.           Assessment & Plan:

## 2018-10-29 NOTE — Patient Instructions (Signed)
Continue off of oxcarbazepine medication.  Start prednisone for rash. Take 2 tablets for 3 days then 1 tablet for 3 days.  Please update me in a few days as discussed.  It was a pleasure to see you today!

## 2018-10-30 ENCOUNTER — Encounter (HOSPITAL_COMMUNITY): Payer: Self-pay | Admitting: Psychiatry

## 2018-10-30 NOTE — Progress Notes (Signed)
Virtual Visit via Video Note  I connected with Katie Robbins on 10/30/18 at  3:00 PM EDT by a video enabled telemedicine application and verified that I am speaking with the correct person using two identifiers.  Location: Patient: Katie Robbins Provider: [name], LCSW   I discussed the limitations of evaluation and management by telemedicine and the availability of in person appointments. The patient expressed understanding and agreed to proceed.  History of Present Illness: Cognitive and Mood impacts from TBI, Anxiety related to medical issue and PTSD   Observations/Objective: Counselor met with Katie Robbins and her husband, Katie Robbins for family therapy via Webex. Counselor assessed MH symptoms and progress on treatment plan goals. Katie Robbins denied suicidal ideation or self-harm behaviors. Katie Robbins shared that she was experiencing a rash from her new psyc medication and that she was in intermittent pain from her wrist surgery. Counselor, Katie Robbins and Start discussed the concerns, root of issue and how she is addressing it. Counselor encouraged her to follow up with medical professionals. Counselor also will reach out to make them aware of the issues. Counselor processed acceptance, mood reactions, communication skills, grief and loss issues and better coping skills with Katie Robbins and Katie Robbins. Together they plan to implement some of the strategies to see if they will alleviate some of the issues. They were able to identify some areas of progress. We discussed future, upcoming appointments and their summer plans.   Assessment and Plan: Counselor will continue to meet with Katie Robbins to address treatment plan goals. Katie Robbins will continue to follow recommendations of providers and implement skills learned in session.  Follow Up Instructions: Counselor will send information for next session via Webex.   I provided 60 minutes of non-face-to-face time during this encounter.   [name], LCSW

## 2018-11-03 ENCOUNTER — Other Ambulatory Visit (HOSPITAL_COMMUNITY): Payer: Self-pay | Admitting: Psychiatry

## 2018-11-03 ENCOUNTER — Telehealth (HOSPITAL_COMMUNITY): Payer: Self-pay

## 2018-11-03 NOTE — Telephone Encounter (Signed)
Patient called requesting a refill on her Adderall xr 30mg . She left for vacation on Friday for 2 weeks and stated she only has enough for 9 days until the 8th or 9th. She has a scheduled appointment for 11/24/18. Please review and advise. Thank you.

## 2018-11-04 ENCOUNTER — Other Ambulatory Visit (HOSPITAL_COMMUNITY): Payer: Self-pay | Admitting: Psychiatry

## 2018-11-04 MED ORDER — AMPHETAMINE-DEXTROAMPHET ER 30 MG PO CP24: 30.0000 mg | ORAL_CAPSULE | Freq: Two times a day (BID) | ORAL | 0 refills | Status: DC

## 2018-11-04 NOTE — Telephone Encounter (Signed)
Done

## 2018-11-05 ENCOUNTER — Encounter: Payer: Self-pay | Admitting: Family Medicine

## 2018-11-05 ENCOUNTER — Ambulatory Visit: Payer: 59 | Admitting: Family Medicine

## 2018-11-05 ENCOUNTER — Other Ambulatory Visit: Payer: Self-pay

## 2018-11-05 VITALS — BP 152/84 | HR 107 | Temp 99.0°F | Ht 62.0 in | Wt 151.8 lb

## 2018-11-05 DIAGNOSIS — T63461A Toxic effect of venom of wasps, accidental (unintentional), initial encounter: Secondary | ICD-10-CM

## 2018-11-05 MED ORDER — PREDNISONE 20 MG PO TABS: ORAL_TABLET | ORAL | 0 refills | Status: DC

## 2018-11-05 MED ORDER — TRIAMCINOLONE ACETONIDE 0.1 % EX CREA: 1.0000 "application " | TOPICAL_CREAM | Freq: Two times a day (BID) | CUTANEOUS | 0 refills | Status: DC

## 2018-11-05 NOTE — Progress Notes (Signed)
Katie Makris T. Kathyann Spaugh, MD Primary Care and Sports Medicine [facility] at [facility] [place], [address] Phone: [phone]  FAX: [name]  MRN [id number]  Date of Birth: [birth year]  Visit Date: 11/05/2018  PCP: Pleas Koch, NP  Referred by: Pleas Koch, NP  Chief Complaint  Patient presents with  . Insect Bite    Wasp stung her on Sunday-Right Hand   Subjective:   Katie Robbins is a 63 y.o. very pleasant female patient who presents with the following:  Wasp sting on Sunday: R distal hand / fingers.  They are swollen, and they have been getting worse.  She does have an area of redness above the third digit near the MCP, is not warm.  She not having any fevers or anything.  She has been putting on some topical Benadryl, she is also taken 25 mg of Benadryl about every 6 hours.  She also been icing and elevating.  CTS 1 month ago  Past Medical History, Surgical History, Social History, Family History, Problem List, Medications, and Allergies have been reviewed and updated if relevant.  Patient Active Problem List   Diagnosis Date Noted  . Rash 10/29/2018  . Mood disorder as late effect of traumatic brain injury ([place]) 08/07/2018  . Mild neurocognitive disorder due to traumatic brain injury ([place]) 08/07/2018  . Prediabetes 07/08/2018  . Numbness and tingling 07/08/2018  . Environmental and seasonal allergies 04/24/2018  . Dyspnea 02/06/2018  . Chronic fatigue 01/23/2018  . Primary osteoarthritis of left knee 05/29/2017  . S/P carpal tunnel release 05/29/2017  . Primary osteoarthritis of both hands 05/29/2017  . History of bilateral breast implants 05/29/2017  . Interstitial cystitis 05/29/2017  . History of mitral valve prolapse 05/29/2017  . History of diverticulitis 05/29/2017  . Cervical disc disorder with radiculopathy of cervical region 04/12/2016  . Preventative health care  11/28/2015  . Esophageal reflux 08/19/2015  . Essential hypertension 08/19/2015  . Hypothyroidism 08/19/2015  . Asthma 08/19/2015  . Cognitive disorder 08/19/2015  . Chronic headaches 08/19/2015  . Estrogen deficiency 08/19/2015    Past Medical History:  Diagnosis Date  . Anxiety   . Arthritis   . Asthma   . Chicken pox   . Chronic headaches   . Diverticulitis   . Essential hypertension   . GERD (gastroesophageal reflux disease)   . Guillain Barr syndrome ([place]) 1982  . H/O breast implant   . Hypothyroidism   . Interstitial cystitis   . Mitral prolapse 1985  . Nutcracker esophagus     Past Surgical History:  Procedure Laterality Date  . ABDOMINAL HYSTERECTOMY  2000/2009  . APPENDECTOMY  2007  . BOWEL RESECTION  2007  . CARDIAC CATHETERIZATION  2016   Clean  . CARPAL TUNNEL RELEASE Right   . CHOLECYSTECTOMY  2012  . CHOLECYSTECTOMY  2012  . NECK SURGERY  06/11/2016   plates and screws in neck   . SMALL INTESTINE SURGERY  2007   Bowel resection  . TONSILLECTOMY    . VESICO-VAGINAL FISTULA REPAIR  2007    Social History   Socioeconomic History  . Marital status: Married    Spouse name: [name]  . Number of children: 2  . Years of education: Some college  . Highest education level: Not on file  Occupational History  . Occupation: Retired  Scientific laboratory technician  . Financial resource strain: Not on file  . Food  insecurity    Worry: Not on file    Inability: Not on file  . Transportation needs    Medical: Not on file    Non-medical: Not on file  Tobacco Use  . Smoking status: Never Smoker  . Smokeless tobacco: Never Used  Substance and Sexual Activity  . Alcohol use: Yes    Comment: socially  . Drug use: No  . Sexual activity: Yes  Lifestyle  . Physical activity    Days per week: Not on file    Minutes per session: Not on file  . Stress: Not on file  Relationships  . Social Herbalist on phone: Not on file    Gets together: Not on file     Attends religious service: Not on file    Active member of club or organization: Not on file    Attends meetings of clubs or organizations: Not on file    Relationship status: Not on file  . Intimate partner violence    Fear of current or ex partner: Not on file    Emotionally abused: Not on file    Physically abused: Not on file    Forced sexual activity: Not on file  Other Topics Concern  . Not on file  Social History Narrative   Lives with husband   Caffeine use: rare   Right handed     Family History  Problem Relation Age of Onset  . Hypertension Mother   . GER disease Mother   . Polymyalgia rheumatica Mother   . Arthritis Mother   . GER disease Father     Allergies  Allergen Reactions  . Bactrim [Sulfamethoxazole-Trimethoprim] Hives  . Macrobid [Nitrofurantoin Macrocrystal] Hives  . Tizanidine Other (See Comments)    Bad Mood changs  . Vicodin [Hydrocodone-Acetaminophen] Swelling    Puffy eyes and face  . Hydrocodone   . Nexium [Esomeprazole Magnesium]     Face swelling, hives   . Prilosec [Omeprazole]     Face swelling, hives   . Sulfa Antibiotics   . Erythromycin Rash    Medication list reviewed and updated in full in [place].   GEN: No acute illnesses, no fevers, chills. GI: No n/v/d, eating normally Pulm: No SOB Interactive and getting along well at home.  Otherwise, ROS is as per the HPI.  Objective:   BP (!) 152/84   Pulse (!) 107   Temp 99 F (37.2 C) (Oral)   Ht 5\' 2"  (1.575 m)   Wt 151 lb 12 oz (68.8 kg)   BMI 27.76 kg/m   GEN: WDWN, NAD, Non-toxic, A & O x 3 HEENT: Atraumatic, Normocephalic. Neck supple. No masses, No LAD. Ears and Nose: No external deformity. EXTR: No c/c/e NEURO Normal gait.  PSYCH: Normally interactive. Conversant. Not depressed or anxious appearing.  Calm demeanor.   She has swelling in the hand on the right, and a minimal swelling on the left.  This is most notable on the dorsum of the right hand.   There is some redness at the third MCP dorsally.  She is able to flex and extend all fingers and has good movement at the wrist on the right.  Laboratory and Imaging Data:  Assessment and Plan:     ICD-10-CM   1. Wasp sting, accidental or unintentional, initial encounter  T63.461A    Ongoing was sting reaction.  Add oral steroids and topical steroids. No sign of infection.  Follow-up: No follow-ups on file.  Meds ordered this encounter  Medications  . predniSONE (DELTASONE) 20 MG tablet    Sig: 2 tabs po daily for 5 days, then 1 tab po daily for 5 days    Dispense:  15 tablet    Refill:  0  . triamcinolone cream (KENALOG) 0.1 %    Sig: Apply 1 application topically 2 (two) times daily.    Dispense:  30 g    Refill:  0   No orders of the defined types were placed in this encounter.   Signed,  Maud Deed. Koltin Wehmeyer, MD   Outpatient Encounter Medications as of 11/05/2018  Medication Sig  . amLODipine (NORVASC) 5 MG tablet TAKE 1 TABLET BY MOUTH ONCE DAILY  . amphetamine-dextroamphetamine (ADDERALL XR) 30 MG 24 hr capsule Take 1 capsule (30 mg total) by mouth 2 (two) times a day for 30 days. Take one at 8 AM and one around 2 PM  . BD PEN NEEDLE NANO U/F 32G X 4 MM MISC USE ONE  4 TIMES DAILY WITH  INSULIN  PEN  . cyanocobalamin (,VITAMIN B-12,) 1000 MCG/ML injection INJECT 1 VIAL ( 1ML) INTO THE MUSCLE EVERY 3 TO 4 WEEKS  . diazepam (VALIUM) 5 MG tablet Take 1 tablet (5 mg total) by mouth every 8 (eight) hours as needed for anxiety.  Marland Kitchen estradiol (ESTRACE) 1 MG tablet Take 1 mg by mouth daily.  Marland Kitchen levothyroxine (SYNTHROID, LEVOTHROID) 112 MCG tablet TAKE 1 TABLET BY MOUTH ONCE A DAY  . magic mouthwash SOLN Take 15 mLs by mouth 3 (three) times daily as needed for mouth pain (Modified Magic Mouthwash).  Marland Kitchen olmesartan (BENICAR) 20 MG tablet TAKE 1 TABLET BY MOUTH TWICE (2) DAILY  . pantoprazole (PROTONIX) 40 MG tablet TAKE 1 TABLET BY MOUTH TWICE A DAY  . SYMBICORT 160-4.5 MCG/ACT  inhaler INHALE 2 PUFFS INTO THE LUNGS TWICE DAILY  . VENTOLIN HFA 108 (90 Base) MCG/ACT inhaler INHALE 2 PUFFS INTO THE LUNGS EVERY 6 HOURS AS NEEDED FOR WHEEZING ORSHORTNESS OF BREATH  . [DISCONTINUED] predniSONE (DELTASONE) 20 MG tablet Take two tablets for 3 days, then one tablet for 3 days.  . predniSONE (DELTASONE) 20 MG tablet 2 tabs po daily for 5 days, then 1 tab po daily for 5 days  . triamcinolone cream (KENALOG) 0.1 % Apply 1 application topically 2 (two) times daily.  . [DISCONTINUED] OXcarbazepine (TRILEPTAL) 150 MG tablet Take 1 tablet (150 mg total) by mouth 2 (two) times daily.   No facility-administered encounter medications on file as of 11/05/2018.

## 2018-11-06 ENCOUNTER — Ambulatory Visit: Payer: 59 | Admitting: Neurology

## 2018-11-12 ENCOUNTER — Ambulatory Visit (HOSPITAL_COMMUNITY): Payer: 59 | Admitting: Psychiatry

## 2018-11-20 ENCOUNTER — Other Ambulatory Visit: Payer: Self-pay | Admitting: Primary Care

## 2018-11-20 DIAGNOSIS — I10 Essential (primary) hypertension: Secondary | ICD-10-CM

## 2018-11-20 DIAGNOSIS — E039 Hypothyroidism, unspecified: Secondary | ICD-10-CM

## 2018-11-20 DIAGNOSIS — R7303 Prediabetes: Secondary | ICD-10-CM

## 2018-11-24 ENCOUNTER — Other Ambulatory Visit: Payer: Self-pay

## 2018-11-24 ENCOUNTER — Ambulatory Visit (INDEPENDENT_AMBULATORY_CARE_PROVIDER_SITE_OTHER): Payer: 59 | Admitting: Psychiatry

## 2018-11-24 DIAGNOSIS — G3184 Mild cognitive impairment, so stated: Secondary | ICD-10-CM

## 2018-11-24 DIAGNOSIS — S069X9S Unspecified intracranial injury with loss of consciousness of unspecified duration, sequela: Secondary | ICD-10-CM

## 2018-11-24 DIAGNOSIS — F0630 Mood disorder due to known physiological condition, unspecified: Secondary | ICD-10-CM

## 2018-11-24 DIAGNOSIS — IMO0001 Reserved for inherently not codable concepts without codable children: Secondary | ICD-10-CM

## 2018-11-24 DIAGNOSIS — S069XAS Unspecified intracranial injury with loss of consciousness status unknown, sequela: Secondary | ICD-10-CM

## 2018-11-24 MED ORDER — DIAZEPAM 5 MG PO TABS: 5.0000 mg | ORAL_TABLET | Freq: Three times a day (TID) | ORAL | 1 refills | Status: DC | PRN

## 2018-11-24 MED ORDER — AMPHETAMINE-DEXTROAMPHETAMINE 20 MG PO TABS: 20.0000 mg | ORAL_TABLET | Freq: Three times a day (TID) | ORAL | 0 refills | Status: DC

## 2018-11-24 MED ORDER — DONEPEZIL HCL 5 MG PO TABS: 5.0000 mg | ORAL_TABLET | Freq: Every day | ORAL | 1 refills | Status: DC
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
## 2018-11-26 ENCOUNTER — Telehealth: Payer: Self-pay

## 2018-11-26 NOTE — Telephone Encounter (Signed)
Left detailed VM w COVID screen and back door lab info   

## 2018-11-28 ENCOUNTER — Other Ambulatory Visit (INDEPENDENT_AMBULATORY_CARE_PROVIDER_SITE_OTHER): Payer: 59

## 2018-11-28 ENCOUNTER — Other Ambulatory Visit: Payer: Self-pay

## 2018-11-28 DIAGNOSIS — I10 Essential (primary) hypertension: Secondary | ICD-10-CM

## 2018-11-28 DIAGNOSIS — R7303 Prediabetes: Secondary | ICD-10-CM | POA: Diagnosis not present

## 2018-11-28 DIAGNOSIS — E039 Hypothyroidism, unspecified: Secondary | ICD-10-CM

## 2018-11-28 LAB — LIPID PANEL
Cholesterol: 209 mg/dL — ABNORMAL HIGH (ref 0–200)
HDL: 77.8 mg/dL (ref >39.00)
LDL Cholesterol: 115 mg/dL — ABNORMAL HIGH (ref 0–99)
NonHDL: 131.48
Total CHOL/HDL Ratio: 3
Triglycerides: 83 mg/dL (ref 0.0–149.0)
VLDL: 16.6 mg/dL (ref 0.0–40.0)

## 2018-11-28 LAB — BASIC METABOLIC PANEL
BUN: 22 mg/dL (ref 6–23)
CO2: 27 mEq/L (ref 19–32)
Calcium: 9.3 mg/dL (ref 8.4–10.5)
Chloride: 105 mEq/L (ref 96–112)
Creatinine, Ser: 1.45 mg/dL — ABNORMAL HIGH (ref 0.40–1.20)
GFR: 36.45 mL/min — ABNORMAL LOW (ref >60.00)
Glucose, Bld: 118 mg/dL — ABNORMAL HIGH (ref 70–99)
Potassium: 4.4 mEq/L (ref 3.5–5.1)
Sodium: 140 mEq/L (ref 135–145)

## 2018-11-28 LAB — TSH: TSH: 0.78 u[IU]/mL (ref 0.35–4.50)

## 2018-11-28 LAB — HEMOGLOBIN A1C: Hgb A1c MFr Bld: 6.1 % (ref 4.6–6.5)

## 2018-12-01 ENCOUNTER — Telehealth: Payer: Self-pay | Admitting: Primary Care

## 2018-12-01 NOTE — Telephone Encounter (Signed)
I have called patient and left a message. Asking patient if we can move her appointment to 3:20 pm on Friday, 12/05/2018

## 2018-12-03 ENCOUNTER — Other Ambulatory Visit: Payer: Self-pay | Admitting: Primary Care

## 2018-12-03 DIAGNOSIS — K219 Gastro-esophageal reflux disease without esophagitis: Secondary | ICD-10-CM

## 2018-12-03 DIAGNOSIS — K224 Dyskinesia of esophagus: Secondary | ICD-10-CM

## 2018-12-03 DIAGNOSIS — E039 Hypothyroidism, unspecified: Secondary | ICD-10-CM

## 2018-12-05 ENCOUNTER — Ambulatory Visit (INDEPENDENT_AMBULATORY_CARE_PROVIDER_SITE_OTHER): Payer: 59 | Admitting: Primary Care

## 2018-12-05 ENCOUNTER — Encounter: Payer: 59 | Admitting: Primary Care

## 2018-12-05 ENCOUNTER — Encounter: Payer: Self-pay | Admitting: Primary Care

## 2018-12-05 ENCOUNTER — Other Ambulatory Visit: Payer: Self-pay

## 2018-12-05 VITALS — BP 126/76 | HR 91 | Temp 98.4°F | Ht 62.0 in | Wt 151.5 lb

## 2018-12-05 DIAGNOSIS — S069XAS Unspecified intracranial injury with loss of consciousness status unknown, sequela: Secondary | ICD-10-CM

## 2018-12-05 DIAGNOSIS — Z23 Encounter for immunization: Secondary | ICD-10-CM | POA: Diagnosis not present

## 2018-12-05 DIAGNOSIS — G3184 Mild cognitive impairment, so stated: Secondary | ICD-10-CM

## 2018-12-05 DIAGNOSIS — R202 Paresthesia of skin: Secondary | ICD-10-CM

## 2018-12-05 DIAGNOSIS — I10 Essential (primary) hypertension: Secondary | ICD-10-CM

## 2018-12-05 DIAGNOSIS — S069X9S Unspecified intracranial injury with loss of consciousness of unspecified duration, sequela: Secondary | ICD-10-CM

## 2018-12-05 DIAGNOSIS — Z0000 Encounter for general adult medical examination without abnormal findings: Secondary | ICD-10-CM | POA: Diagnosis not present

## 2018-12-05 DIAGNOSIS — R5382 Chronic fatigue, unspecified: Secondary | ICD-10-CM

## 2018-12-05 DIAGNOSIS — R7303 Prediabetes: Secondary | ICD-10-CM

## 2018-12-05 DIAGNOSIS — IMO0001 Reserved for inherently not codable concepts without codable children: Secondary | ICD-10-CM

## 2018-12-05 DIAGNOSIS — F0630 Mood disorder due to known physiological condition, unspecified: Secondary | ICD-10-CM

## 2018-12-05 DIAGNOSIS — E039 Hypothyroidism, unspecified: Secondary | ICD-10-CM | POA: Diagnosis not present

## 2018-12-05 DIAGNOSIS — M19041 Primary osteoarthritis, right hand: Secondary | ICD-10-CM

## 2018-12-05 DIAGNOSIS — J4540 Moderate persistent asthma, uncomplicated: Secondary | ICD-10-CM

## 2018-12-05 DIAGNOSIS — K219 Gastro-esophageal reflux disease without esophagitis: Secondary | ICD-10-CM

## 2018-12-05 DIAGNOSIS — R200 Anesthesia of skin: Secondary | ICD-10-CM

## 2018-12-05 DIAGNOSIS — M19042 Primary osteoarthritis, left hand: Secondary | ICD-10-CM

## 2018-12-05 NOTE — Addendum Note (Signed)
Addended by: Jacqualin Combes on: 12/05/2018 04:33 PM   Modules accepted: Orders

## 2018-12-05 NOTE — Assessment & Plan Note (Addendum)
Recent A1C of 6.1 which is an increase from 5.6 in early 2020. Recommended to reduce alcohol/beer and to start exercising.   Repeat A1C in 3 months.

## 2018-12-05 NOTE — Progress Notes (Signed)
Subjective:    Patient ID: Katie Robbins, female    DOB: Sep 20, 1955, 63 y.o.   MRN: [id number]  HPI  Katie Robbins is a 63 year old female who presents today for complete physical.  Immunizations: -Tetanus: Completed in 2016 -Influenza: Due this season  -Pneumonia: Completed in 2016 -Shingles: Never completed  Diet: She endorses a healthy diet. Fruit, cheese, vegetables, pasta, lean protein. Desserts nightly. She is drinking mostly water, little soda, wine/alcohol. Exercise: She is not exercising   Eye exam: Completed 2 years ago.  Dental exam: Completes semi-annually  Colonoscopy: Completed in 2018, due in 2023 Pap Smear: Hysterectomy  Mammogram: Completed in December 2019  BP Readings from Last 3 Encounters:  12/05/18 126/76  11/05/18 (!) 152/84  07/23/18 (!) 147/79      Review of Systems  Constitutional: Negative for unexpected weight change.  HENT: Negative for rhinorrhea.   Respiratory: Negative for cough, shortness of breath and wheezing.   Cardiovascular: Negative for chest pain.  Gastrointestinal: Negative for constipation and diarrhea.  Genitourinary: Negative for difficulty urinating.       Denies hot flashes  Musculoskeletal: Positive for arthralgias. Negative for myalgias.       Chronic hand pain bilaterally.  Skin: Negative for rash.  Allergic/Immunologic: Negative for environmental allergies.  Neurological: Positive for numbness. Negative for dizziness and headaches.       Chronic bilateral hand and foot numbness  Psychiatric/Behavioral:       Follow-up following with neuropsychiatry and therapy       Past Medical History:  Diagnosis Date  . Anxiety   . Arthritis   . Asthma   . Chicken pox   . Chronic headaches   . Diverticulitis   . Essential hypertension   . GERD (gastroesophageal reflux disease)   . Guillain Barr syndrome ([place]) 1982  . H/O breast implant   . Hypothyroidism   . Interstitial cystitis   . Mitral prolapse 1985  .  Nutcracker esophagus      Social History   Socioeconomic History  . Marital status: Married    Spouse name: Katie Robbins  . Number of children: 2  . Years of education: Some college  . Highest education level: Not on file  Occupational History  . Occupation: Retired  Scientific laboratory technician  . Financial resource strain: Not on file  . Food insecurity    Worry: Not on file    Inability: Not on file  . Transportation needs    Medical: Not on file    Non-medical: Not on file  Tobacco Use  . Smoking status: Never Smoker  . Smokeless tobacco: Never Used  Substance and Sexual Activity  . Alcohol use: Yes    Comment: socially  . Drug use: No  . Sexual activity: Yes  Lifestyle  . Physical activity    Days per week: Not on file    Minutes per session: Not on file  . Stress: Not on file  Relationships  . Social Herbalist on phone: Not on file    Gets together: Not on file    Attends religious service: Not on file    Active member of club or organization: Not on file    Attends meetings of clubs or organizations: Not on file    Relationship status: Not on file  . Intimate partner violence    Fear of current or ex partner: Not on file    Emotionally abused: Not on file    Physically abused:  Not on file    Forced sexual activity: Not on file  Other Topics Concern  . Not on file  Social History Narrative   Lives with husband   Caffeine use: rare   Right handed     Past Surgical History:  Procedure Laterality Date  . ABDOMINAL HYSTERECTOMY  2000/2009  . APPENDECTOMY  2007  . BOWEL RESECTION  2007  . CARDIAC CATHETERIZATION  2016   Clean  . CARPAL TUNNEL RELEASE Right   . CHOLECYSTECTOMY  2012  . CHOLECYSTECTOMY  2012  . NECK SURGERY  06/11/2016   plates and screws in neck   . SMALL INTESTINE SURGERY  2007   Bowel resection  . TONSILLECTOMY    . VESICO-VAGINAL FISTULA REPAIR  2007    Family History  Problem Relation Age of Onset  . Hypertension Mother   . GER  disease Mother   . Polymyalgia rheumatica Mother   . Arthritis Mother   . GER disease Father     Allergies  Allergen Reactions  . Bactrim [Sulfamethoxazole-Trimethoprim] Hives  . Macrobid [Nitrofurantoin Macrocrystal] Hives  . Tizanidine Other (See Comments)    Bad Mood changs  . Vicodin [Hydrocodone-Acetaminophen] Swelling    Puffy eyes and face  . Trileptal [Oxcarbazepine] Rash  . Hydrocodone   . Nexium [Esomeprazole Magnesium]     Face swelling, hives   . Prilosec [Omeprazole]     Face swelling, hives   . Sulfa Antibiotics   . Erythromycin Rash    Current Outpatient Medications on File Prior to Visit  Medication Sig Dispense Refill  . amLODipine (NORVASC) 5 MG tablet TAKE 1 TABLET BY MOUTH ONCE DAILY 90 tablet 1  . amphetamine-dextroamphetamine (ADDERALL) 20 MG tablet Take 1 tablet (20 mg total) by mouth 3 (three) times daily after meals. 90 tablet 0  . BD PEN NEEDLE NANO U/F 32G X 4 MM MISC USE ONE  4 TIMES DAILY WITH  INSULIN  PEN 300 each 1  . cyanocobalamin (,VITAMIN B-12,) 1000 MCG/ML injection INJECT 1 VIAL ( 1ML) INTO THE MUSCLE EVERY 3 TO 4 WEEKS 24 mL 1  . diazepam (VALIUM) 5 MG tablet Take 1 tablet (5 mg total) by mouth every 8 (eight) hours as needed for anxiety. 90 tablet 1  . donepezil (ARICEPT) 5 MG tablet Take 1 tablet (5 mg total) by mouth at bedtime. 30 tablet 1  . levothyroxine (SYNTHROID, LEVOTHROID) 112 MCG tablet TAKE 1 TABLET BY MOUTH ONCE A DAY 90 tablet 3  . magic mouthwash SOLN Take 15 mLs by mouth 3 (three) times daily as needed for mouth pain (Modified Magic Mouthwash). 360 mL 0  . olmesartan (BENICAR) 20 MG tablet TAKE 1 TABLET BY MOUTH TWICE (2) DAILY 180 tablet 3  . pantoprazole (PROTONIX) 40 MG tablet TAKE 1 TABLET BY MOUTH TWICE A DAY 60 tablet 2  . SYMBICORT 160-4.5 MCG/ACT inhaler INHALE 2 PUFFS INTO THE LUNGS TWICE DAILY 10.2 g 5  . VENTOLIN HFA 108 (90 Base) MCG/ACT inhaler INHALE 2 PUFFS INTO THE LUNGS EVERY 6 HOURS AS NEEDED FOR WHEEZING  ORSHORTNESS OF BREATH 18 g 0   No current facility-administered medications on file prior to visit.     BP 126/76   Pulse 91   Temp 98.4 F (36.9 C) (Temporal)   Ht 5\' 2"  (1.575 m)   Wt 151 lb 8 oz (68.7 kg)   SpO2 98%   BMI 27.71 kg/m    Objective:   Physical Exam  Constitutional: She is  oriented to person, place, and time. She appears well-nourished.  HENT:  Mouth/Throat: No oropharyngeal exudate.  Eyes: Pupils are equal, round, and reactive to light. EOM are normal.  Neck: Neck supple. No thyromegaly present.  Cardiovascular: Normal rate and regular rhythm.  Respiratory: Effort normal and breath sounds normal.  GI: Soft. Bowel sounds are normal. There is no abdominal tenderness.  Musculoskeletal:     Comments: Decrease in range of motion to left palmar hand, chronic.  Neurological: She is alert and oriented to person, place, and time.  Skin: Skin is warm and dry.  Psychiatric: She has a normal mood and affect.           Assessment & Plan:

## 2018-12-05 NOTE — Assessment & Plan Note (Addendum)
Following with rheumatology, Dr. Trudie Reed. Recently underwent work-up which was unremarkable for rheumatoid arthritis.  She plans to follow-up with Dr. Saintclair Halsted for numbness and tingling

## 2018-12-05 NOTE — Assessment & Plan Note (Signed)
Chronic and ongoing. Lab work-up grossly unremarkable. Recommended she start working on regular exercise. Continue therapy sessions.

## 2018-12-05 NOTE — Assessment & Plan Note (Signed)
Doing well on Symbicort daily as prescribed. Using albuterol PRN and sparingly.  Continue current regimen.

## 2018-12-05 NOTE — Patient Instructions (Addendum)
Work on exercising. You should be getting 150 minutes of moderate intensity exercise weekly.  Limit alcohol and beer when possible.   Complete your mammogram this December when due.  Schedule a lab appointment for 3 months for repeat blood sugar check.  Schedule a nurse visit for 2-6 months from now for your second shingles vaccination.  It was a pleasure to see you today!   Preventive Care 63-63 Years Old, Female Preventive care refers to visits with your health care provider and lifestyle choices that can promote health and wellness. This includes:  A yearly physical exam. This may also be called an annual well check.  Regular dental visits and eye exams.  Immunizations.  Screening for certain conditions.  Healthy lifestyle choices, such as eating a healthy diet, getting regular exercise, not using drugs or products that contain nicotine and tobacco, and limiting alcohol use. What can I expect for my preventive care visit? Physical exam Your health care provider will check your:  Height and weight. This may be used to calculate body mass index (BMI), which tells if you are at a healthy weight.  Heart rate and blood pressure.  Skin for abnormal spots. Counseling Your health care provider may ask you questions about your:  Alcohol, tobacco, and drug use.  Emotional well-being.  Home and relationship well-being.  Sexual activity.  Eating habits.  Work and work Statistician.  Method of birth control.  Menstrual cycle.  Pregnancy history. What immunizations do I need?  Influenza (flu) vaccine  This is recommended every year. Tetanus, diphtheria, and pertussis (Tdap) vaccine  You may need a Td booster every 10 years. Varicella (chickenpox) vaccine  You may need this if you have not been vaccinated. Zoster (shingles) vaccine  You may need this after age 38. Measles, mumps, and rubella (MMR) vaccine  You may need at least one dose of MMR if you were born  in 1957 or later. You may also need a second dose. Pneumococcal conjugate (PCV13) vaccine  You may need this if you have certain conditions and were not previously vaccinated. Pneumococcal polysaccharide (PPSV23) vaccine  You may need one or two doses if you smoke cigarettes or if you have certain conditions. Meningococcal conjugate (MenACWY) vaccine  You may need this if you have certain conditions. Hepatitis A vaccine  You may need this if you have certain conditions or if you travel or work in places where you may be exposed to hepatitis A. Hepatitis B vaccine  You may need this if you have certain conditions or if you travel or work in places where you may be exposed to hepatitis B. Haemophilus influenzae type b (Hib) vaccine  You may need this if you have certain conditions. Human papillomavirus (HPV) vaccine  If recommended by your health care provider, you may need three doses over 6 months. You may receive vaccines as individual doses or as more than one vaccine together in one shot (combination vaccines). Talk with your health care provider about the risks and benefits of combination vaccines. What tests do I need? Blood tests  Lipid and cholesterol levels. These may be checked every 5 years, or more frequently if you are over 11 years old.  Hepatitis C test.  Hepatitis B test. Screening  Lung cancer screening. You may have this screening every year starting at age 82 if you have a 30-pack-year history of smoking and currently smoke or have quit within the past 15 years.  Colorectal cancer screening. All adults should have  this screening starting at age 41 and continuing until age 70. Your health care provider may recommend screening at age 37 if you are at increased risk. You will have tests every 1-10 years, depending on your results and the type of screening test.  Diabetes screening. This is done by checking your blood sugar (glucose) after you have not eaten for a  while (fasting). You may have this done every 1-3 years.  Mammogram. This may be done every 1-2 years. Talk with your health care provider about when you should start having regular mammograms. This may depend on whether you have a family history of breast cancer.  BRCA-related cancer screening. This may be done if you have a family history of breast, ovarian, tubal, or peritoneal cancers.  Pelvic exam and Pap test. This may be done every 3 years starting at age 80. Starting at age 51, this may be done every 5 years if you have a Pap test in combination with an HPV test. Other tests  Sexually transmitted disease (STD) testing.  Bone density scan. This is done to screen for osteoporosis. You may have this scan if you are at high risk for osteoporosis. Follow these instructions at home: Eating and drinking  Eat a diet that includes fresh fruits and vegetables, whole grains, lean protein, and low-fat dairy.  Take vitamin and mineral supplements as recommended by your health care provider.  Do not drink alcohol if: ? Your health care provider tells you not to drink. ? You are pregnant, may be pregnant, or are planning to become pregnant.  If you drink alcohol: ? Limit how much you have to 0-1 drink a day. ? Be aware of how much alcohol is in your drink. In the [place] one drink equals one 12 oz bottle of beer (355 mL), one 5 oz glass of wine (148 mL), or one 1 oz glass of hard liquor (44 mL). Lifestyle  Take daily care of your teeth and gums.  Stay active. Exercise for at least 30 minutes on 5 or more days each week.  Do not use any products that contain nicotine or tobacco, such as cigarettes, e-cigarettes, and chewing tobacco. If you need help quitting, ask your health care provider.  If you are sexually active, practice safe sex. Use a condom or other form of birth control (contraception) in order to prevent pregnancy and STIs (sexually transmitted infections).  If told by your  health care provider, take low-dose aspirin daily starting at age 46. What's next?  Visit your health care provider once a year for a well check visit.  Ask your health care provider how often you should have your eyes and teeth checked.  Stay up to date on all vaccines. This information is not intended to replace advice given to you by your health care provider. Make sure you discuss any questions you have with your health care provider. Document Released: 05/27/2015 Document Revised: 01/09/2018 Document Reviewed: 01/09/2018 Elsevier Patient Education  2020 [employer].

## 2018-12-05 NOTE — Assessment & Plan Note (Signed)
Shingles vaccination provided today.  She will schedule a nurse visit for the second vaccination for 2 to 6 months.  Tetanus up-to-date. Mammogram due this winter. Colonoscopy up-to-date. Encouraged a healthy diet regular exercise. Exam stable. Labs reviewed.

## 2018-12-05 NOTE — Assessment & Plan Note (Addendum)
Following with neuropsychiatry, overall doing well on current regimen. Working to come off of Adderall.  Compliant to donepezil.

## 2018-12-05 NOTE — Telephone Encounter (Signed)
Refill sent to pharmacy.   

## 2018-12-05 NOTE — Assessment & Plan Note (Signed)
Recent TSH stable, taking levothyroxine appropriately.  Continue levothyroxine 112 mcg.

## 2018-12-05 NOTE — Assessment & Plan Note (Signed)
Stable in the office today, continue Amlodipine 5 mg and olmesartan 20 mg daily. BMP reviewed.

## 2018-12-05 NOTE — Assessment & Plan Note (Addendum)
Following with neuropsychiatry.  Doing well on Adderall 60 mg 2-3 times daily and is working to wean herself off slowly. Also compliant to donepezil HS.  Using diazepam as needed for breakthrough anxiety.

## 2018-12-05 NOTE — Assessment & Plan Note (Signed)
Overall stable on current regimen of pantoprazole. Endoscopy UTD per patient. Following with GI.

## 2018-12-05 NOTE — Assessment & Plan Note (Addendum)
Following with neurosurgery for chronic numbness to hands and feet. Also completed full work up for upper and lower extremity numbness per neurology.

## 2018-12-05 NOTE — Telephone Encounter (Signed)
Was holding it until appointment on 12/05/2018

## 2018-12-25 ENCOUNTER — Other Ambulatory Visit: Payer: Self-pay | Admitting: Primary Care

## 2018-12-25 ENCOUNTER — Other Ambulatory Visit (HOSPITAL_COMMUNITY): Payer: Self-pay | Admitting: Psychiatry

## 2018-12-25 DIAGNOSIS — J4540 Moderate persistent asthma, uncomplicated: Secondary | ICD-10-CM

## 2018-12-26 ENCOUNTER — Telehealth (HOSPITAL_COMMUNITY): Payer: Self-pay

## 2018-12-26 ENCOUNTER — Other Ambulatory Visit (HOSPITAL_COMMUNITY): Payer: Self-pay | Admitting: Psychiatry

## 2018-12-26 NOTE — Telephone Encounter (Signed)
I just ordered it yesterday?

## 2018-12-26 NOTE — Telephone Encounter (Signed)
Patient is calling for a refill on her Adderall, she is still using the same pharmacy.

## 2018-12-29 ENCOUNTER — Ambulatory Visit (INDEPENDENT_AMBULATORY_CARE_PROVIDER_SITE_OTHER): Payer: 59 | Admitting: Psychiatry

## 2018-12-29 ENCOUNTER — Other Ambulatory Visit: Payer: Self-pay

## 2018-12-29 DIAGNOSIS — IMO0001 Reserved for inherently not codable concepts without codable children: Secondary | ICD-10-CM

## 2018-12-29 DIAGNOSIS — F410 Panic disorder [episodic paroxysmal anxiety] without agoraphobia: Secondary | ICD-10-CM | POA: Diagnosis not present

## 2018-12-29 DIAGNOSIS — S069X9S Unspecified intracranial injury with loss of consciousness of unspecified duration, sequela: Secondary | ICD-10-CM | POA: Diagnosis not present

## 2018-12-29 DIAGNOSIS — F0630 Mood disorder due to known physiological condition, unspecified: Secondary | ICD-10-CM | POA: Diagnosis not present

## 2018-12-29 DIAGNOSIS — G3184 Mild cognitive impairment, so stated: Secondary | ICD-10-CM

## 2018-12-29 DIAGNOSIS — S069XAS Unspecified intracranial injury with loss of consciousness status unknown, sequela: Secondary | ICD-10-CM

## 2018-12-29 MED ORDER — DIAZEPAM 5 MG PO TABS: 5.0000 mg | ORAL_TABLET | Freq: Three times a day (TID) | ORAL | 1 refills | Status: DC | PRN

## 2018-12-29 MED ORDER — ESCITALOPRAM OXALATE 10 MG PO TABS: ORAL_TABLET | ORAL | 0 refills | Status: DC

## 2018-12-29 MED ORDER — AMPHETAMINE-DEXTROAMPHETAMINE 20 MG PO TABS: 20.0000 mg | ORAL_TABLET | Freq: Three times a day (TID) | ORAL | 0 refills | Status: DC

## 2018-12-29 MED ORDER — DONEPEZIL HCL 10 MG PO TABS: 10.0000 mg | ORAL_TABLET | Freq: Every day | ORAL | 1 refills | Status: DC
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
## 2018-12-31 ENCOUNTER — Other Ambulatory Visit: Payer: Self-pay

## 2018-12-31 ENCOUNTER — Ambulatory Visit (INDEPENDENT_AMBULATORY_CARE_PROVIDER_SITE_OTHER): Payer: 59 | Admitting: Psychiatry

## 2018-12-31 ENCOUNTER — Encounter (HOSPITAL_COMMUNITY): Payer: Self-pay | Admitting: Psychiatry

## 2018-12-31 DIAGNOSIS — F4310 Post-traumatic stress disorder, unspecified: Secondary | ICD-10-CM

## 2018-12-31 DIAGNOSIS — F064 Anxiety disorder due to known physiological condition: Secondary | ICD-10-CM | POA: Diagnosis not present

## 2018-12-31 NOTE — Progress Notes (Signed)
Virtual Visit via Video Note  I connected with [name] on 12/31/18 at  1:00 PM EDT by a video enabled telemedicine application and verified that I am speaking with the correct person using two identifiers.  Location: Patient: Sharel Behne Provider: [name], LCSW   I discussed the limitations of evaluation and management by telemedicine and the availability of in person appointments. The patient expressed understanding and agreed to proceed.  History of Present Illness: Anxiety disorder due to medical conditions and PTSD   Observations/Objective: Counselor met with Adrean for individual therapy via Webex. Counselor assessed MH symptoms and progress on treatment plan goals. Fable denied suicidal ideation or self-harm behaviors. [name] shared that she has seen improvement in communication between her and her husband and that they have been implementing the communication skills discussed at our last session. Counselor explored the changes and improvements. Reyne shared that she has been experiencing more physical health issues and issues with medications. She shared about her concerns and upcoming appointments. Counselor assessed how the health concerns are impacting her mental health. [place]ale shared that she continues to journal, take things slowly, express her needs, and engaging in self-care activities. We will start meeting weekly again to better stabilize her mental health symptoms.   Assessment and Plan: Counselor will continue to meet with patient to address treatment plan goals. Patient will continue to follow recommendations of providers and implement skills learned in session.  Follow Up Instructions: Counselor will send information for next session via Webex.     I discussed the assessment and treatment plan with the patient. The patient was provided an opportunity to ask questions and all were answered. The patient agreed with the plan and demonstrated an understanding of the  instructions.   The patient was advised to call back or seek an in-person evaluation if the symptoms worsen or if the condition fails to improve as anticipated.  I provided 60 minutes of non-face-to-face time during this encounter.   [name], LCSW

## 2019-01-07 ENCOUNTER — Ambulatory Visit (INDEPENDENT_AMBULATORY_CARE_PROVIDER_SITE_OTHER): Payer: 59 | Admitting: Psychiatry

## 2019-01-07 ENCOUNTER — Other Ambulatory Visit: Payer: Self-pay

## 2019-01-07 DIAGNOSIS — F064 Anxiety disorder due to known physiological condition: Secondary | ICD-10-CM

## 2019-01-07 DIAGNOSIS — F4310 Post-traumatic stress disorder, unspecified: Secondary | ICD-10-CM

## 2019-01-07 NOTE — Progress Notes (Signed)
Virtual Visit via Video Note  I connected with Katie Robbins on 01/07/19 at  1:00 PM EDT by a video enabled telemedicine application and verified that I am speaking with the correct person using two identifiers.  Location: Patient: Katie Robbins Provider: [name], LCSW   I discussed the limitations of evaluation and management by telemedicine and the availability of in person appointments. The patient expressed understanding and agreed to proceed.  History of Present Illness: Anxiety Disorder due to medical condition and PTSD   Observations/Objective: Counselor met with Katie Robbins for individual therapy via Webex. Counselor assessed MH symptoms and progress on treatment plan goals. Katie Robbins denied suicidal ideation or self-harm behaviors. Katie Robbins shared that her panic attacks are becoming less frequent and less intense due to a medication change at a recent psych appointment. Counselor assessed the instances of panic attacks and discussed and shared materials on alternative ways to prevent and recover from them. Counselor evaluated life stressors with Katie Robbins reporting continued frustrations with her health conditions-numbness in extremities, pain and becoming tired easily. Katie Robbins did not having more motivation to do more around the house this week. She reported improvements in her relationship and communication with her husband. Counselor celebrated these successes with her and send some additional information on panic attacks for her to review before next session.   Assessment and Plan: Counselor will continue to meet with patient to address treatment plan goals. Patient will continue to follow recommendations of providers and implement skills learned in session.  Follow Up Instructions: Counselor will send information for next session via Webex.     I discussed the assessment and treatment plan with the patient. The patient was provided an opportunity to ask questions and all were answered. The  patient agreed with the plan and demonstrated an understanding of the instructions.   The patient was advised to call back or seek an in-person evaluation if the symptoms worsen or if the condition fails to improve as anticipated.  I provided 53 minutes of non-face-to-face time during this encounter.   [name], LCSW

## 2019-01-08 ENCOUNTER — Encounter (HOSPITAL_COMMUNITY): Payer: Self-pay | Admitting: Psychiatry

## 2019-01-12 ENCOUNTER — Other Ambulatory Visit: Payer: Self-pay | Admitting: Nephrology

## 2019-01-12 DIAGNOSIS — N183 Chronic kidney disease, stage 3 unspecified: Secondary | ICD-10-CM

## 2019-01-13 DIAGNOSIS — R200 Anesthesia of skin: Secondary | ICD-10-CM | POA: Insufficient documentation

## 2019-01-14 ENCOUNTER — Encounter (HOSPITAL_COMMUNITY): Payer: Self-pay | Admitting: Psychiatry

## 2019-01-14 ENCOUNTER — Ambulatory Visit (INDEPENDENT_AMBULATORY_CARE_PROVIDER_SITE_OTHER): Payer: 59 | Admitting: Psychiatry

## 2019-01-14 ENCOUNTER — Other Ambulatory Visit: Payer: Self-pay

## 2019-01-14 DIAGNOSIS — F064 Anxiety disorder due to known physiological condition: Secondary | ICD-10-CM

## 2019-01-14 DIAGNOSIS — F4310 Post-traumatic stress disorder, unspecified: Secondary | ICD-10-CM

## 2019-01-15 NOTE — Progress Notes (Signed)
Virtual Visit via Video Note  I connected with [name] on 01/14/19 at  1:00 PM EDT by a video enabled telemedicine application and verified that I am speaking with the correct person using two identifiers.  Location: Patient: Katie Robbins Provider: [name], LCSW   I discussed the limitations of evaluation and management by telemedicine and the availability of in person appointments. The patient expressed understanding and agreed to proceed.  History of Present Illness: Anxiety Disorder Due to medical condition and PTSD   Observations/Objective: Counselor met with Fiona for individual therapy via Webex. Counselor assessed MH symptoms and progress on treatment plan goals. Georganne denied suicidal ideation or self-harm behaviors. Yanel shared that she had been in a lot of pain and feeling miserable the past week. Counselor explored how her pain is impacting her mental health and relationship. Lanayah shared that she had concerning reports from her recent tests and medical appointments. Counselor gathered information, her thoughts and feelings and her plan for moving forward. Roena shared about medication changes and the positive impacts with her sleep and daytime energy. Counselor encouraged Juliahna to continue following doctors recommendations and to attend follow up appointments. Breonna continues to journal and communicate her needs with her husband.    Assessment and Plan: Counselor will continue to meet with patient to address treatment plan goals. Patient will continue to follow recommendations of providers and implement skills learned in session.  Follow Up Instructions: Counselor will send information for next session via Webex.     I discussed the assessment and treatment plan with the patient. The patient was provided an opportunity to ask questions and all were answered. The patient agreed with the plan and demonstrated an understanding of the instructions.   The patient was  advised to call back or seek an in-person evaluation if the symptoms worsen or if the condition fails to improve as anticipated.  I provided 45 minutes of non-face-to-face time during this encounter.  [name], LCSW

## 2019-01-21 ENCOUNTER — Ambulatory Visit (HOSPITAL_COMMUNITY): Payer: 59 | Admitting: Psychiatry

## 2019-01-21 ENCOUNTER — Other Ambulatory Visit: Payer: Self-pay

## 2019-01-27 ENCOUNTER — Other Ambulatory Visit: Payer: Self-pay

## 2019-01-27 ENCOUNTER — Telehealth: Payer: Self-pay | Admitting: Primary Care

## 2019-01-27 ENCOUNTER — Encounter: Payer: Self-pay | Admitting: Primary Care

## 2019-01-27 ENCOUNTER — Ambulatory Visit: Payer: 59 | Admitting: Primary Care

## 2019-01-27 VITALS — BP 130/70 | HR 94 | Temp 98.6°F | Ht 62.0 in | Wt 154.5 lb

## 2019-01-27 DIAGNOSIS — R200 Anesthesia of skin: Secondary | ICD-10-CM | POA: Diagnosis not present

## 2019-01-27 DIAGNOSIS — R202 Paresthesia of skin: Secondary | ICD-10-CM

## 2019-01-27 DIAGNOSIS — E538 Deficiency of other specified B group vitamins: Secondary | ICD-10-CM | POA: Insufficient documentation

## 2019-01-27 LAB — CBC
HCT: 37.3 % (ref 36.0–46.0)
Hemoglobin: 12.3 g/dL (ref 12.0–15.0)
MCHC: 32.9 g/dL (ref 30.0–36.0)
MCV: 92 fl (ref 78.0–100.0)
Platelets: 248 10*3/uL (ref 150.0–400.0)
RBC: 4.05 Mil/uL (ref 3.87–5.11)
RDW: 14.9 % (ref 11.5–15.5)
WBC: 8 10*3/uL (ref 4.0–10.5)

## 2019-01-27 LAB — VITAMIN B12: Vitamin B-12: 630 pg/mL (ref 211–911)

## 2019-01-27 NOTE — Patient Instructions (Signed)
Stop by the lab prior to leaving today. I will notify you of your results once received.   Stop by the front desk and speak with Rosaria Ferries regarding your referral to Neurology.  It was a pleasure to see you today!

## 2019-01-27 NOTE — Assessment & Plan Note (Addendum)
Chronic and progressing per patient. EMG testing and recent follow up with neurosurgeon completed in early September 2020, we will fax for those notes and testing.  After a call to neurosurgeon's office we discovered that the request to be seen by neurology was not an urgent request and that Dr. Jannifer Franklin is not retiring but going part time. Patient was updated regarding this information. We will move forward with her regularly scheduled appointment with her neurologist. She is now on a wait list for cancellations.   Will recommend she mention her fecal incontinence to her neurosurgeon given her progressive numbness, although this seems to have improved.   Check CBC and B12 today. Other labs are UTD.

## 2019-01-27 NOTE — Telephone Encounter (Signed)
Called Dr Annamaria Boots office and they are sending over the office notes and EMG from her last visit with them on 01/13/19. Spoke with Dr Annamaria Boots secretary and she said they sent a Referral over to [facility] for patient on 01/13/2019 it was not an Urgent request. Patient is on their wait list for a cancellation. Dr Jannifer Franklin is not retiring he will be going part time. We can try to send a New Neurology Referral somewhere else but she wont be seen for at least 3 months. Spoke to the patient and explained what I found out from Dr Karlton Lemon. [facility] says if we feel the patient needs to be seen sooner than 03/06/19 we need to do a Peer to Peer with Dr Jannifer Franklin.

## 2019-01-27 NOTE — Assessment & Plan Note (Signed)
Chronic, compliant to B12 injections monthly. Repeat B12 and CBC pending.
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
## 2019-01-27 NOTE — Telephone Encounter (Signed)
Noted.  Given that the request from neurosurgery wasn't urgent, we will cancel our referral and have her see her neurologist as scheduled.

## 2019-01-28 ENCOUNTER — Ambulatory Visit (INDEPENDENT_AMBULATORY_CARE_PROVIDER_SITE_OTHER): Payer: 59 | Admitting: Psychiatry

## 2019-01-28 ENCOUNTER — Encounter (HOSPITAL_COMMUNITY): Payer: Self-pay | Admitting: Psychiatry

## 2019-01-28 DIAGNOSIS — F4310 Post-traumatic stress disorder, unspecified: Secondary | ICD-10-CM | POA: Diagnosis not present

## 2019-01-28 DIAGNOSIS — F064 Anxiety disorder due to known physiological condition: Secondary | ICD-10-CM

## 2019-01-28 NOTE — Progress Notes (Signed)
Virtual Visit via Video Note  I connected with Katie Robbins on 01/28/19 at  1:00 PM EDT by a video enabled telemedicine application and verified that I am speaking with the correct person using two identifiers.  Location: Patient: Katie Robbins Provider: [name], LCSW   I discussed the limitations of evaluation and management by telemedicine and the availability of in person appointments. The patient expressed understanding and agreed to proceed.  History of Present Illness: Anxiety DO due to general medical condition and PTSD   Observations/Objective: Counselor met with Katie Robbins for individual therapy via Webex. Counselor assessed MH symptoms and progress on treatment plan goals. Katie Robbins denied suicidal ideation or self-harm behaviors. Katie Robbins shared that her health has significantly declined over the past two weeks. She has followed up with doctors and will engage in additional testing to determine root cause and treatment options. The physical issues are causing her mental health to be impacted as well with increased anxiety and depressive symptoms because she is less active and able to complete normal daily tasks and routines. She reports having dizzy spells, incontinence, extreme fatigue and neurological reactions in her extremities. Counselor and Katie Robbins discussed medication concerns. She plans on meeting with psychiatrist soon to address issues. Counselor reviewed coping strategies, offered supportive listening and strategies. Katie Robbins expressed appreciation for the therapeutic relationship and the consistency it gives in maintaining hope and mental health. Katie Robbins shared that on a positive note she has developed a close friendship recently and is enjoying having a social outlet.   Assessment and Plan: Counselor will continue to meet with patient to address treatment plan goals. Patient will continue to follow recommendations of providers and implement skills learned in session.  Follow Up  Instructions: Counselor will send information for next session via Webex.     I discussed the assessment and treatment plan with the patient. The patient was provided an opportunity to ask questions and all were answered. The patient agreed with the plan and demonstrated an understanding of the instructions.   The patient was advised to call back or seek an in-person evaluation if the symptoms worsen or if the condition fails to improve as anticipated.  I provided 60 minutes of non-face-to-face time during this encounter.   [name], LCSW

## 2019-01-29 ENCOUNTER — Ambulatory Visit (INDEPENDENT_AMBULATORY_CARE_PROVIDER_SITE_OTHER): Payer: 59 | Admitting: Psychiatry

## 2019-01-29 ENCOUNTER — Ambulatory Visit
Admission: RE | Admit: 2019-01-29 | Discharge: 2019-01-29 | Disposition: A | Discharge status: Home / Self Care | Payer: 59 | Source: Ambulatory Visit | Attending: Nephrology | Admitting: Nephrology

## 2019-01-29 ENCOUNTER — Other Ambulatory Visit: Payer: Self-pay

## 2019-01-29 DIAGNOSIS — F0630 Mood disorder due to known physiological condition, unspecified: Secondary | ICD-10-CM | POA: Diagnosis not present

## 2019-01-29 DIAGNOSIS — N183 Chronic kidney disease, stage 3 unspecified: Secondary | ICD-10-CM

## 2019-01-29 DIAGNOSIS — S069XAS Unspecified intracranial injury with loss of consciousness status unknown, sequela: Secondary | ICD-10-CM

## 2019-01-29 DIAGNOSIS — S069X9S Unspecified intracranial injury with loss of consciousness of unspecified duration, sequela: Secondary | ICD-10-CM

## 2019-01-29 DIAGNOSIS — F410 Panic disorder [episodic paroxysmal anxiety] without agoraphobia: Secondary | ICD-10-CM | POA: Diagnosis not present

## 2019-01-29 MED ORDER — AMPHETAMINE-DEXTROAMPHETAMINE 20 MG PO TABS: 20.0000 mg | ORAL_TABLET | Freq: Three times a day (TID) | ORAL | 0 refills | Status: DC

## 2019-01-29 MED ORDER — ESCITALOPRAM OXALATE 10 MG PO TABS: 10.0000 mg | ORAL_TABLET | Freq: Every day | ORAL | 0 refills | Status: DC

## 2019-01-29 MED ORDER — DIAZEPAM 5 MG PO TABS: 5.0000 mg | ORAL_TABLET | Freq: Three times a day (TID) | ORAL | 1 refills | Status: AC | PRN
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
## 2019-01-30 ENCOUNTER — Other Ambulatory Visit: Payer: Self-pay | Admitting: Primary Care

## 2019-01-30 DIAGNOSIS — J4540 Moderate persistent asthma, uncomplicated: Secondary | ICD-10-CM

## 2019-02-04 ENCOUNTER — Encounter (HOSPITAL_COMMUNITY): Payer: Self-pay | Admitting: Psychiatry

## 2019-02-04 ENCOUNTER — Other Ambulatory Visit: Payer: Self-pay

## 2019-02-04 ENCOUNTER — Ambulatory Visit (INDEPENDENT_AMBULATORY_CARE_PROVIDER_SITE_OTHER): Payer: 59 | Admitting: Psychiatry

## 2019-02-04 DIAGNOSIS — S069XAS Unspecified intracranial injury with loss of consciousness status unknown, sequela: Secondary | ICD-10-CM

## 2019-02-04 DIAGNOSIS — F064 Anxiety disorder due to known physiological condition: Secondary | ICD-10-CM | POA: Diagnosis not present

## 2019-02-04 DIAGNOSIS — F0630 Mood disorder due to known physiological condition, unspecified: Secondary | ICD-10-CM | POA: Diagnosis not present

## 2019-02-04 DIAGNOSIS — S069X9S Unspecified intracranial injury with loss of consciousness of unspecified duration, sequela: Secondary | ICD-10-CM

## 2019-02-04 NOTE — Progress Notes (Signed)
Virtual Visit via Video Note  I connected with Katie Robbins on 02/04/19 at  1:00 PM EDT by a video enabled telemedicine application and verified that I am speaking with the correct person using two identifiers.  Location: Patient: Katie Robbins Provider: [name], LCSW   I discussed the limitations of evaluation and management by telemedicine and the availability of in person appointments. The patient expressed understanding and agreed to proceed.  History of Present Illness: Anxiety disorder due to general medical condition   Observations/Objective: Counselor met with Katie Robbins for individual therapy via Webex. Counselor assessed MH symptoms and progress on treatment plan goals. Katie Robbins denied suicidal ideation or self-harm behaviors. Katie Robbins shared that she has experienced marked improvement in her health over the past few days. Counselor and Katie Robbins discussed contributing factors to improvement. Katie Robbins stated she has not experienced a panic attack since our last session. However, she continued to feel overwhelmed and exhausted after spending significant time with others or out of the house in public. Katie Robbins reports that her incontinence has improved over the past week, and believes it is due to medication changes. Katie Robbins continues to endorse memory issues, dizzy spells and tiredness. Counselor processed coping skills with Katie Robbins, as well as assessed benefits of newly formed relationship with a peer.   Assessment and Plan: Counselor will continue to meet with patient to address treatment plan goals. Patient will continue to follow recommendations of providers and implement skills learned in session.  Follow Up Instructions: Counselor will send information for next session via Webex.    I discussed the assessment and treatment plan with the patient. The patient was provided an opportunity to ask questions and all were answered. The patient agreed with the plan and demonstrated an understanding of the  instructions.   The patient was advised to call back or seek an in-person evaluation if the symptoms worsen or if the condition fails to improve as anticipated.  I provided 60 minutes of non-face-to-face time during this encounter.   [name], LCSW

## 2019-02-05 ENCOUNTER — Telehealth: Payer: Self-pay | Admitting: Primary Care

## 2019-02-05 DIAGNOSIS — T63461D Toxic effect of venom of wasps, accidental (unintentional), subsequent encounter: Secondary | ICD-10-CM

## 2019-02-05 NOTE — Telephone Encounter (Signed)
Patient wants to know if she needs an Epi pen prescribed to keep around the house due to wasp sting and autoimmune disease.  Patient uses [employer].  Please leave a detailed message on her answering machine with response.

## 2019-02-06 NOTE — Telephone Encounter (Signed)
Has she ever been prescribed an Epi Pen before? Does she understand the purpose and when to use? Does she know how to use?

## 2019-02-10 ENCOUNTER — Ambulatory Visit: Payer: 59

## 2019-02-10 NOTE — Telephone Encounter (Signed)
Patient was a no-show 

## 2019-02-10 NOTE — Telephone Encounter (Signed)
Asked Larene Beach to speak with patient about this today when she comes in for nurse visit.

## 2019-02-11 ENCOUNTER — Ambulatory Visit (INDEPENDENT_AMBULATORY_CARE_PROVIDER_SITE_OTHER): Payer: 59 | Admitting: Psychiatry

## 2019-02-11 ENCOUNTER — Other Ambulatory Visit: Payer: Self-pay

## 2019-02-11 ENCOUNTER — Encounter (HOSPITAL_COMMUNITY): Payer: Self-pay | Admitting: Psychiatry

## 2019-02-11 DIAGNOSIS — F064 Anxiety disorder due to known physiological condition: Secondary | ICD-10-CM | POA: Diagnosis not present

## 2019-02-11 DIAGNOSIS — F4310 Post-traumatic stress disorder, unspecified: Secondary | ICD-10-CM

## 2019-02-11 MED ORDER — EPINEPHRINE 0.3 MG/0.3ML IJ SOAJ: 0.3000 mg | INTRAMUSCULAR | 0 refills | Status: DC | PRN

## 2019-02-11 NOTE — Telephone Encounter (Signed)
Patient reply to message on MyChart

## 2019-02-11 NOTE — Progress Notes (Signed)
Virtual Visit via Video Note  I connected with Katie Robbins on 02/11/19 at  1:00 PM EDT by a video enabled telemedicine application and verified that I am speaking with the correct person using two identifiers.  Location: Patient: Katie Robbins Provider: [name], LCSW   I discussed the limitations of evaluation and management by telemedicine and the availability of in person appointments. The patient expressed understanding and agreed to proceed.  History of Present Illness: PTSD and Anxitey   Observations/Objective: Counselor met with Katie Robbins for individual therapy via Webex. Counselor assessed MH symptoms and progress on treatment plan goals. Katie Robbins denied suicidal ideation or self-harm behaviors. Katie Robbins shared that she continues to be in extreme pain in her feet, legs, hands and arms. Counselor and Katie Robbins discussed ways she can advocate for her care and pain management. Katie Robbins to attempt strategies after session by contacting providers. Counselor discussed safety concerns with health related issues and discussed Katie Robbins's plan for safety. Katie Robbins shared that her husband is able to see her or communicate with her at all times in case of a fall, shortness of breath, inability to move, or balance issues. Counselor explored progress and benefits from her newly formed relationship with friend. Katie Robbins endorsed that the developing friendship has had a positive impact on her self-esteem, social life, mental health and temporary escapes from her pain. Counselor prompted continuation on healthy coping skills, communicating needs to husband, following doctors recommendations and participation in memory apps on her phone for cognitive development and engagement.   Assessment and Plan: Counselor will continue to meet with patient to address treatment plan goals. Patient will continue to follow recommendations of providers and implement skills learned in session.  Follow Up Instructions: Counselor will send  information for next session via Webex.     I discussed the assessment and treatment plan with the patient. The patient was provided an opportunity to ask questions and all were answered. The patient agreed with the plan and demonstrated an understanding of the instructions.   The patient was advised to call back or seek an in-person evaluation if the symptoms worsen or if the condition fails to improve as anticipated.  I provided 60 minutes of non-face-to-face time during this encounter.   [name], LCSW

## 2019-02-11 NOTE — Telephone Encounter (Signed)
Noted and reviewed. Rx sent to pharmacy.

## 2019-02-12 ENCOUNTER — Telehealth: Payer: Self-pay | Admitting: Primary Care

## 2019-02-12 DIAGNOSIS — R200 Anesthesia of skin: Secondary | ICD-10-CM

## 2019-02-12 DIAGNOSIS — R202 Paresthesia of skin: Secondary | ICD-10-CM

## 2019-02-12 DIAGNOSIS — Z9889 Other specified postprocedural states: Secondary | ICD-10-CM

## 2019-02-12 NOTE — Telephone Encounter (Signed)
Patient's husband called.  He would like for Anda Kraft to recommend a Neurologist for patient that's not in the same practice as Dr.Willis.

## 2019-02-16 NOTE — Telephone Encounter (Signed)
Please notify patient and her husband that I can refer her to another neurologist as requested, but it may take several months before she can get seen.  I do recommend she follow-up with Dr. Jannifer Franklin as scheduled in a few weeks until evaluated by another neurologist.

## 2019-02-16 NOTE — Telephone Encounter (Signed)
Message left for patient to return my call.  

## 2019-02-16 NOTE — Telephone Encounter (Signed)
Spoken and notified patient of Katie Robbins's comments. Patient verbalized understanding.  

## 2019-02-20 ENCOUNTER — Encounter: Payer: Self-pay | Admitting: Neurology

## 2019-02-25 ENCOUNTER — Other Ambulatory Visit: Payer: Self-pay

## 2019-02-25 ENCOUNTER — Encounter (HOSPITAL_COMMUNITY): Payer: Self-pay | Admitting: Psychiatry

## 2019-02-25 ENCOUNTER — Ambulatory Visit (INDEPENDENT_AMBULATORY_CARE_PROVIDER_SITE_OTHER): Payer: 59 | Admitting: Psychiatry

## 2019-02-25 DIAGNOSIS — F418 Other specified anxiety disorders: Secondary | ICD-10-CM

## 2019-02-25 DIAGNOSIS — F064 Anxiety disorder due to known physiological condition: Secondary | ICD-10-CM

## 2019-02-25 NOTE — Progress Notes (Signed)
Virtual Visit via Video Note  I connected with Katie Robbins on 02/25/19 at  1:00 PM EDT by a video enabled telemedicine application and verified that I am speaking with the correct person using two identifiers.  Location: Patient: Katie Robbins Provider: [name], LCSW   I discussed the limitations of evaluation and management by telemedicine and the availability of in person appointments. The patient expressed understanding and agreed to proceed.  History of Present Illness: Anxiety disorder due to medical condition   Observations/Objective: Counselor met with Katie Robbins for individual therapy via Webex. Counselor assessed MH symptoms and progress on treatment plan goals. Katie Robbins denied suicidal ideation or self-harm behaviors. Katie Robbins shared that her physical health has continued to decline, to the point that she was unable to walk last week, limbs curl up throughout the night, in extreme pain and medications have caused her to become dizzy and off balance. Counselor explored how that is impacting her daily functioning, self-esteem, mood, and relationships. Katie Robbins feels supported by her husband, friends, family and has taken the initiative to contact other medical providers to be seen sooner. Counselor and Katie Robbins identified there strengths, coping skills, helpful routine and how to be her best advocate. She is hopeful about getting more understanding and treatment in the following week.   Assessment and Plan: Counselor will continue to meet with patient to address treatment plan goals. Patient will continue to follow recommendations of providers and implement skills learned in session.  Follow Up Instructions: Counselor will send information for next session via Webex.     I discussed the assessment and treatment plan with the patient. The patient was provided an opportunity to ask questions and all were answered. The patient agreed with the plan and demonstrated an understanding of the  instructions.   The patient was advised to call back or seek an in-person evaluation if the symptoms worsen or if the condition fails to improve as anticipated.  I provided 60 minutes of non-face-to-face time during this encounter.   [name], LCSW

## 2019-02-27 ENCOUNTER — Other Ambulatory Visit: Payer: Self-pay | Admitting: Primary Care

## 2019-02-27 DIAGNOSIS — I10 Essential (primary) hypertension: Secondary | ICD-10-CM

## 2019-02-27 DIAGNOSIS — J4540 Moderate persistent asthma, uncomplicated: Secondary | ICD-10-CM

## 2019-03-02 ENCOUNTER — Other Ambulatory Visit: Payer: Self-pay | Admitting: Primary Care

## 2019-03-02 DIAGNOSIS — R7303 Prediabetes: Secondary | ICD-10-CM

## 2019-03-02 DIAGNOSIS — N289 Disorder of kidney and ureter, unspecified: Secondary | ICD-10-CM

## 2019-03-05 ENCOUNTER — Telehealth: Payer: Self-pay

## 2019-03-05 NOTE — Telephone Encounter (Signed)
LVM w COVID screen and back lab info

## 2019-03-06 ENCOUNTER — Ambulatory Visit: Payer: 59 | Admitting: Neurology

## 2019-03-09 ENCOUNTER — Other Ambulatory Visit: Payer: 59

## 2019-03-09 ENCOUNTER — Other Ambulatory Visit: Payer: Self-pay

## 2019-03-09 ENCOUNTER — Ambulatory Visit: Payer: 59 | Admitting: Neurology

## 2019-03-09 ENCOUNTER — Encounter: Payer: Self-pay | Admitting: Neurology

## 2019-03-09 ENCOUNTER — Telehealth: Payer: Self-pay

## 2019-03-09 VITALS — BP 123/72 | HR 87 | Temp 97.6°F | Ht 63.0 in | Wt 162.0 lb

## 2019-03-09 DIAGNOSIS — R200 Anesthesia of skin: Secondary | ICD-10-CM | POA: Diagnosis not present

## 2019-03-09 DIAGNOSIS — R202 Paresthesia of skin: Secondary | ICD-10-CM

## 2019-03-09 NOTE — Progress Notes (Signed)
Reason for visit: Memory disturbance, numbness, carpal tunnel syndrome  Katie Robbins is an 63 y.o. female  History of present illness:  Katie Robbins is a 63 year old right-handed white female with a history of carpal tunnel syndrome, status post surgery on the left.  The patient apparently had nerve conduction studies done by Dr. Brien Few and was told that the carpal tunnel had worsened after surgery.  The patient is followed through psychiatry, she has been seen through rheumatology.  She continues to have pain in the arms and legs, she has foot pain with weightbearing.  The last examination done through this office in March 2020 was notable in that the patient was demonstrating total anesthesia of the arms and legs.  This is felt to be due to a psychogenic problem.  The patient has been seen by psychiatry and also has a psychologist who she sees.  She has been placed on Paxil for her depression.  She apparently has a component of fatigue and by the end of the day she is unable to even get up out of a couch or chair to walk.  She has some alteration in her ambulation, taking short shuffling steps, her balance is off but she does not fall.  She denies issues controlling the bowels or the bladder.  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  Past Medical History:  Diagnosis Date  . Anxiety   . Arthritis   . Asthma   . Chicken pox   . Chronic headaches   . Diverticulitis   . Essential hypertension   . GERD (gastroesophageal reflux disease)   . Guillain Barr syndrome ([place]) 1982  . H/O breast implant   . Hypothyroidism   . Interstitial cystitis   . Mitral prolapse 1985  . Nutcracker esophagus     Past Surgical History:  Procedure Laterality Date  . ABDOMINAL HYSTERECTOMY  2000/2009  . APPENDECTOMY  2007  . BOWEL RESECTION  2007  . CARDIAC CATHETERIZATION  2016   Clean  . CARPAL  TUNNEL RELEASE Right   . CHOLECYSTECTOMY  2012  . CHOLECYSTECTOMY  2012  . NECK SURGERY  06/11/2016   plates and screws in neck   . SMALL INTESTINE SURGERY  2007   Bowel resection  . TONSILLECTOMY    . VESICO-VAGINAL FISTULA REPAIR  2007    Family History  Problem Relation Age of Onset  . Hypertension Mother   . GER disease Mother   . Polymyalgia rheumatica Mother   . Arthritis Mother   . GER disease Father     Social history:  reports that she has never smoked. She has never used smokeless tobacco. She reports current alcohol use. She reports that she does not use drugs.    Allergies  Allergen Reactions  . Bactrim [Sulfamethoxazole-Trimethoprim] Hives  . Macrobid [Nitrofurantoin Macrocrystal] Hives  . Tizanidine Other (See Comments)    Bad Mood changs  . Vicodin [Hydrocodone-Acetaminophen] Swelling    Puffy eyes and face  . Trileptal [Oxcarbazepine] Rash  . Hydrocodone   . Nexium [Esomeprazole Magnesium]     Face swelling, hives   . Prilosec [Omeprazole]     Face swelling, hives   . Sulfa Antibiotics   . Erythromycin Rash    Medications:  Prior to Admission medications   Medication Sig Start Date End Date Taking? Authorizing Provider  amLODipine (NORVASC) 5 MG tablet Take 1 tablet by mouth once  daily for blood pressure. 12/05/18  Yes Pleas Koch, NP  BD PEN NEEDLE NANO U/F 32G X 4 MM MISC USE ONE  4 TIMES DAILY WITH  INSULIN  PEN 04/08/17  Yes Pleas Koch, NP  cyanocobalamin (,VITAMIN B-12,) 1000 MCG/ML injection INJECT 1 VIAL ( 1ML) INTO THE MUSCLE EVERY 3 TO 4 WEEKS 03/07/18  Yes Pleas Koch, NP  diazepam (VALIUM) 5 MG tablet Take 1 tablet (5 mg total) by mouth every 8 (eight) hours as needed for anxiety. 01/29/19 03/30/19 Yes Pucilowski, Olgierd A, MD  EPINEPHrine 0.3 mg/0.3 mL IJ SOAJ injection Inject 0.3 mLs (0.3 mg total) into the muscle as needed for anaphylaxis. 02/11/19  Yes Pleas Koch, NP  escitalopram (LEXAPRO) 10 MG tablet Take 1  tablet (10 mg total) by mouth daily. 01/29/19 04/29/19 Yes Pucilowski, Olgierd A, MD  levothyroxine (SYNTHROID) 112 MCG tablet Take 1 tablet by mouth every morning on an empty stomach. No food or other medications for 30 minutes. 12/05/18  Yes Pleas Koch, NP  magic mouthwash SOLN Take 15 mLs by mouth 3 (three) times daily as needed for mouth pain (Modified Magic Mouthwash). 01/08/18  Yes Pleas Koch, NP  olmesartan (BENICAR) 20 MG tablet TAKE 1 TABLET BY MOUTH TWICE A DAY 02/27/19  Yes Pleas Koch, NP  oxyCODONE (OXY IR/ROXICODONE) 5 MG immediate release tablet  02/19/19  Yes [provider]  pantoprazole (PROTONIX) 40 MG tablet TAKE 1 TABLET BY MOUTH TWICE (2) DAILY 12/05/18  Yes Pleas Koch, NP  pregabalin (LYRICA) 75 MG capsule 75 mg 2 (two) times daily.  02/19/19  Yes [provider]  SYMBICORT 160-4.5 MCG/ACT inhaler INHALE 2 PUFFS INTO THE LUNGS TWICE DAILY 02/27/19  Yes Pleas Koch, NP  VENTOLIN HFA 108 (90 Base) MCG/ACT inhaler INHALE 2 PUFFS INTO THE LUNGS EVERY 6 HOURS AS NEEDED FOR WHEEZING ORSHORTNESS OF BREATH. 02/27/19  Yes Pleas Koch, NP  amphetamine-dextroamphetamine (ADDERALL) 20 MG tablet Take 1 tablet (20 mg total) by mouth 3 (three) times daily after meals. 01/29/19 02/28/19  Pucilowski, Marchia Bond, MD    ROS:  Out of a complete 14 system review of symptoms, the patient complains only of the following symptoms, and all other reviewed systems are negative.  Walking difficulty Foot pain Numbness  Blood pressure 123/72, pulse 87, temperature 97.6 F (36.4 C), temperature source Temporal, height 5\' 3"  (1.6 m), weight 162 lb (73.5 kg).  Physical Exam  General: The patient is alert and cooperative at the time of the examination.  Skin: No significant peripheral edema is noted.   Neurologic Exam  Mental status: The patient is alert and oriented x 3 at the time of the examination. The Mini-Mental status examination  done today shows a total score 26/30.   Cranial nerves: Facial symmetry is present. Speech is normal, no aphasia or dysarthria is noted. Extraocular movements are full. Visual fields are full.  The patient has decreased but present pinprick sensation and vibration sensation on the forehead.  Motor: The patient has good strength in all 4 extremities.  Sensory examination: The patient has total anesthesia for pinprick, soft touch, vibration sensation and position sensation on all 4 extremities.  Coordination: The patient has good finger-nose-finger and heel-to-shin bilaterally.  Gait and station: The is able to walk independently, taking short shuffling steps, she does not fall.  Romberg is negative but is unsteady.  Tandem gait is unsteady.  Reflexes: Deep tendon reflexes are symmetric, but are  normal throughout, the patient has well-maintained ankle jerk reflexes bilaterally.   Assessment/Plan:  1.  Bilateral foot pain with weightbearing  2.  Psychogenic sensory complaints  3.  Reported memory disturbance  Psychiatry believes that the patient has an organic brain issue associated with a posttraumatic brain injury.  The patient clearly has psychogenic sensory complaints, no further neurologic work-up is indicated at this time.  I will make a referral to a podiatrist for evaluation of her foot pain.  The patient will follow-up here if needed.  Jill Alexanders MD 03/09/2019 10:34 AM  [facility] [address] [place], [place] [address]  Phone [phone] Fax [phone]

## 2019-03-09 NOTE — Telephone Encounter (Signed)
[facility] Night - Client Nonclinical Telephone Record AccessNurse Client [facility] Night - Client Client Site [place] - Night Physician AA - PHYSICIAN, Verita Schneiders- MD Contact Type Call Who Is Calling Patient / Member / Family / Caregiver [name] Name [name] Phone Number [phone] Patient Name Katie Robbins Patient DOB 07/14/1955 Call Type Message Only Information Provided Reason for Call Request to [facility] Appointment Initial Comment [name] has lab work scheduled for 8:10 AM today, however, forgot to fast for this. Labs scheduled for blood sugar check and caller ate some sweets last night as well as a late dinner. Afraid this will throw the test Additional Comment Office hours provided. Will call later this morning to reschedule. Call Closed By: Marinus Maw Transaction Date/Time: 03/09/2019 7:19:06 AM (ET)

## 2019-03-11 ENCOUNTER — Ambulatory Visit (INDEPENDENT_AMBULATORY_CARE_PROVIDER_SITE_OTHER): Payer: 59 | Admitting: Psychiatry

## 2019-03-11 ENCOUNTER — Other Ambulatory Visit: Payer: Self-pay

## 2019-03-11 ENCOUNTER — Encounter (HOSPITAL_COMMUNITY): Payer: Self-pay | Admitting: Psychiatry

## 2019-03-11 DIAGNOSIS — F064 Anxiety disorder due to known physiological condition: Secondary | ICD-10-CM

## 2019-03-11 DIAGNOSIS — F413 Other mixed anxiety disorders: Secondary | ICD-10-CM | POA: Diagnosis not present

## 2019-03-11 NOTE — Progress Notes (Signed)
Virtual Visit via Video Note  I connected with Katie Robbins on 03/11/19 at  1:00 PM EDT by a video enabled telemedicine application and verified that I am speaking with the correct person using two identifiers.  Location: Patient: Katie Robbins Provider: [name], LCSW   I discussed the limitations of evaluation and management by telemedicine and the availability of in person appointments. The patient expressed understanding and agreed to proceed.  History of Present Illness: Anxiety fur to general medical condition   Observations/Objective: Counselor met with Katie Robbins for individual therapy via Webex. Counselor assessed MH symptoms and progress on treatment plan goals. Zya presented with moderate anxiety and moderate depression. Clotine denied suicidal ideation or self-harm behaviors. Katie Robbins shared that her health continues to decline, discussing medical and treatment challenges. Counselor explored thoughts and feelings. We reviewed coping strategies and on solution focused interventions. Counselor explored support system and assessed daily functioning. Counselor advised for Katie Robbins to continue following doctor's recommendations and follow up with specialist appointments.   Assessment and Plan: Counselor will continue to meet with patient to address treatment plan goals. Patient will continue to follow recommendations of providers and implement skills learned in session.  Follow Up Instructions: Counselor will send information for next session via Webex.   I discussed the assessment and treatment plan with the patient. The patient was provided an opportunity to ask questions and all were answered. The patient agreed with the plan and demonstrated an understanding of the instructions.   The patient was advised to call back or seek an in-person evaluation if the symptoms worsen or if the condition fails to improve as anticipated.  I provided 55 minutes of non-face-to-face time during this  encounter.   [name], LCSW

## 2019-03-12 ENCOUNTER — Encounter: Payer: Self-pay | Admitting: Family Medicine

## 2019-03-12 ENCOUNTER — Ambulatory Visit: Payer: 59 | Admitting: Family Medicine

## 2019-03-12 ENCOUNTER — Other Ambulatory Visit: Payer: Self-pay

## 2019-03-12 VITALS — BP 130/66 | HR 100 | Temp 97.6°F | Ht 63.0 in | Wt 160.3 lb

## 2019-03-12 DIAGNOSIS — R300 Dysuria: Secondary | ICD-10-CM | POA: Diagnosis not present

## 2019-03-12 LAB — POC URINALSYSI DIPSTICK (AUTOMATED)
Bilirubin, UA: NEGATIVE
Glucose, UA: NEGATIVE
Ketones, UA: NEGATIVE
Nitrite, UA: NEGATIVE
Protein, UA: POSITIVE — AB
Spec Grav, UA: 1.015 (ref 1.010–1.025)
Urobilinogen, UA: 0.2 E.U./dL
pH, UA: 6 (ref 5.0–8.0)

## 2019-03-12 MED ORDER — CEPHALEXIN 500 MG PO CAPS: 500.0000 mg | ORAL_CAPSULE | Freq: Two times a day (BID) | ORAL | 0 refills | Status: DC

## 2019-03-12 NOTE — Progress Notes (Signed)
Dysuria: yes, frequency, burning with urination.   duration of symptoms: about 1 week ago.   abdominal pain:yes, lower abd pain.   Fevers: no back pain: no Vomiting: no Patient denies PCN allergy.   H/o IC noted but this was atypical for patient.    Meds, vitals, and allergies reviewed.  Per HPI unless specifically indicated in ROS section   GEN: nad, alert and oriented HEENT: ncat NECK: supple CV: rrr.  PULM: ctab, no inc wob ABD: soft, +bs, suprapubic area not tender EXT: no edema SKIN: well perfused.  BACK: no CVA pain

## 2019-03-12 NOTE — Patient Instructions (Signed)
Drink plenty of water and start the antibiotics today.  We'll contact you with your lab report.  Take care.   

## 2019-03-14 LAB — URINE CULTURE
MICRO NUMBER:: 1045043
SPECIMEN QUALITY:: ADEQUATE

## 2019-03-16 DIAGNOSIS — R300 Dysuria: Secondary | ICD-10-CM | POA: Insufficient documentation

## 2019-03-16 MED ORDER — AMOXICILLIN-POT CLAVULANATE 875-125 MG PO TABS: 1.0000 | ORAL_TABLET | Freq: Two times a day (BID) | ORAL | 0 refills | Status: DC

## 2019-03-16 NOTE — Assessment & Plan Note (Signed)
Presumed UTI, cystitis.  Discussed with patient.  Plan was to start Keflex.  Will need to change to Augmentin based on urine culture, which was resulted after the office visit.  See notes on labs.  Still okay for outpatient follow-up.

## 2019-03-17 ENCOUNTER — Other Ambulatory Visit (HOSPITAL_COMMUNITY): Payer: Self-pay | Admitting: Psychiatry

## 2019-03-18 ENCOUNTER — Encounter: Payer: Self-pay | Admitting: Podiatry

## 2019-03-18 ENCOUNTER — Other Ambulatory Visit: Payer: Self-pay

## 2019-03-18 ENCOUNTER — Ambulatory Visit (INDEPENDENT_AMBULATORY_CARE_PROVIDER_SITE_OTHER): Payer: 59 | Admitting: Psychiatry

## 2019-03-18 ENCOUNTER — Encounter (HOSPITAL_COMMUNITY): Payer: Self-pay | Admitting: Psychiatry

## 2019-03-18 ENCOUNTER — Ambulatory Visit: Payer: 59 | Admitting: Podiatry

## 2019-03-18 ENCOUNTER — Other Ambulatory Visit: Payer: Self-pay | Admitting: Podiatry

## 2019-03-18 ENCOUNTER — Ambulatory Visit (INDEPENDENT_AMBULATORY_CARE_PROVIDER_SITE_OTHER): Payer: 59

## 2019-03-18 DIAGNOSIS — G629 Polyneuropathy, unspecified: Secondary | ICD-10-CM

## 2019-03-18 DIAGNOSIS — M779 Enthesopathy, unspecified: Secondary | ICD-10-CM

## 2019-03-18 DIAGNOSIS — M2042 Other hammer toe(s) (acquired), left foot: Secondary | ICD-10-CM

## 2019-03-18 DIAGNOSIS — M7752 Other enthesopathy of left foot: Secondary | ICD-10-CM | POA: Diagnosis not present

## 2019-03-18 DIAGNOSIS — F4310 Post-traumatic stress disorder, unspecified: Secondary | ICD-10-CM | POA: Diagnosis not present

## 2019-03-18 DIAGNOSIS — F411 Generalized anxiety disorder: Secondary | ICD-10-CM | POA: Diagnosis not present

## 2019-03-18 DIAGNOSIS — F064 Anxiety disorder due to known physiological condition: Secondary | ICD-10-CM

## 2019-03-18 NOTE — Progress Notes (Signed)
   Subjective:    Patient ID: Katie Robbins, female    DOB: [birth year], 63 y.o.   MRN: [id number]  HPI    Review of Systems  Respiratory: Positive for chest tightness and shortness of breath.   All other systems reviewed and are negative.      Objective:   Physical Exam        Assessment & Plan:

## 2019-03-18 NOTE — Progress Notes (Signed)
Virtual Visit via Video Note  I connected with Katie Robbins on 03/18/19 at  1:00 PM EST by a video enabled telemedicine application and verified that I am speaking with the correct person using two identifiers.  Location: Patient: Katie Robbins Provider: [name], LCSW   I discussed the limitations of evaluation and management by telemedicine and the availability of in person appointments. The patient expressed understanding and agreed to proceed.  History of Present Illness: Anxiety due to medical condition.   Observations/Objective: Counselor met with Katie Robbins for individual therapy via Webex. Counselor assessed MH symptoms and progress on treatment plan goals. Katie Robbins presented with moderate depression and moderate anxiety. Katie Robbins denied suicidal ideation or self-harm behaviors. Katie Robbins shared that she continues to feel defeated about resolving and better understanding the best treatments for her physical issues. Counselor assessed daily functioning, med compliance, and connection with support system. Katie Robbins is having difficulties completing daily tasks without extreme fatigue, numbness in extremities, balance problems, memory difficulties and swelling. Katie Robbins expressed frustrations and how her self-esteem and self-confidence has been impacted. Counselor reviewed CBT skills and coping strategies. Katie Robbins plans to spend time with family, which will be a part of her self-care routine and to reconnect with her supports. Counselor and Katie Robbins discussed safety precautions related to her traveling.   Assessment and Plan: Counselor will continue to meet with patient to address treatment plan goals. Patient will continue to follow recommendations of providers and implement skills learned in session.  Follow Up Instructions: Counselor will send information for next session via Webex.     I discussed the assessment and treatment plan with the patient. The patient was provided an opportunity to ask questions  and all were answered. The patient agreed with the plan and demonstrated an understanding of the instructions.   The patient was advised to call back or seek an in-person evaluation if the symptoms worsen or if the condition fails to improve as anticipated.  I provided 38 minutes of non-face-to-face time during this encounter.   [name], LCSW

## 2019-03-18 NOTE — Progress Notes (Signed)
Subjective:   Patient ID: Katie Robbins, female   DOB: 63 y.o.   MRN: [id number]   HPI Patient presents stating she is getting pain in her left foot over her right foot and states it is been inflamed and also that she noticed that her second toe has really lifted in the air over the last 6 months to a year.  Patient does not smoke and would like to be more active   Review of Systems  All other systems reviewed and are negative.       Objective:  Physical Exam Vitals signs and nursing note reviewed.  Constitutional:      Appearance: She is well-developed.  Pulmonary:     Effort: Pulmonary effort is normal.  Musculoskeletal: Normal range of motion.  Skin:    General: Skin is warm.  Neurological:     Mental Status: She is alert.     Neurovascular status intact muscle strength found to be adequate range of motion within normal limits with patient found to have discomfort second metatarsal phalangeal joint left with elevated second toe moderate rigid contracture and mild discomfort on the right foot not to the same degree     Assessment:  Hammertoe deformity with inflammatory capsulitis and possible flexor plate stretch or tear second MPJ left with discomfort also noted right of a mild nature     Plan:  H&P both conditions reviewed and today I focused on the left I did proximal nerve block.  I then went ahead did sterile prep aspirated the joint got out a small amount of clear fluid and injected with quarter cc dexamethasone Kenalog and applied thick plantar pad to take pressure off the joint surface.  Also padding applied right we will see back again in the next 4 to 6 weeks and hopefully this will give her good relief of symptoms and ultimately could require digital fusion but we would like to avoid that at all cost  X-rays indicate that there is a rigid contracture digit to left with no other significant pathology noted

## 2019-03-30 ENCOUNTER — Ambulatory Visit: Payer: 59 | Admitting: Neurology

## 2019-03-30 ENCOUNTER — Encounter: Payer: Self-pay | Admitting: Neurology

## 2019-03-30 ENCOUNTER — Other Ambulatory Visit: Payer: Self-pay

## 2019-03-30 VITALS — BP 148/78 | Ht 63.0 in | Wt 158.0 lb

## 2019-03-30 DIAGNOSIS — R200 Anesthesia of skin: Secondary | ICD-10-CM

## 2019-03-30 DIAGNOSIS — R202 Paresthesia of skin: Secondary | ICD-10-CM | POA: Diagnosis not present

## 2019-03-30 NOTE — Patient Instructions (Signed)
We will touch base after we receive and review your EMG.

## 2019-03-30 NOTE — Progress Notes (Signed)
[facility] Clinic Note - Initial Visit   Date: 03/30/19  Katie Robbins MRN: [id number] DOB: 1955/11/11   Dear Malena Edman, NP:  Thank you for your kind referral of Katie Robbins for consultation of generalized paresthesias and pain. Although her history is well known to you, please allow [place] to reiterate it for the purpose of our medical record. The patient was accompanied to the clinic by husband.    History of Present Illness: Katie Robbins is a 63 y.o. right-handed female with hypertension, asthma, hypothyroidism, OA, chronic fatigue syndrome, TBI with mood disorder, anxiety, and cervical canal stenosis s/p ACDF at C3-4, C5-6 and C6-7 presenting for evaluation of generalized paresthesias and pain.  Starting in 2018, she developed numbness tingling over the feet which has progressed into her mid-thigh.  In the summer of 2020, she  developed numbness in the hands which has progressed all the way up to the biceps.  She also complains of diffuse sharp pain all over her arms and legs.  She was found to have bilateral CTS and underwent bilateral release which helped her wrist pain, but no change to her numbness of the arms and hands.  Most recently, she was prescribed Lyrica 75mg  twice daily and hydrocodone for pain, which has helped. She also complaints of generalized fatigue, poor attention/concentration, memory problems, imbalance, and generalized weakness.   She was evaluated by Dr. Jannifer Franklin for these complaints from 2019 - 2020.  She has been extensively evaluated for these symptoms with MRI brain, MRI cervical spine, and NCS/EMG.  Imaging did not find any structural pathology to explain her diffuse symptoms.  NCS/EMG was performed twice at Dr. Windy Carina office and has been requested for me to review.  Per Dr. Jannifer Franklin' notes, she has had prior EMG  In 2015 which shows subacute left C6 and C8 radiculopathy.  Dr. Jannifer Franklin suggested that her symptoms were psychogenic and she did  not accept this as a possibility and is here for a second opinion.    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  She lives at home with husband.  She denies any stress or anxiety, however, she is seeing both a counselor and a psychiatrist.    Out-side paper records, electronic medical record, and images have been reviewed where available and summarized as:  Lab Results  Component Value Date   HGBA1C 6.1 11/28/2018   Lab Results  Component Value Date   VITAMINB12 630 01/27/2019   Lab Results  Component Value Date   TSH 0.78 11/28/2018   Lab Results  Component Value Date   ESRSEDRATE 5 07/23/2018    Past Medical History:  Diagnosis Date   Anxiety    Arthritis    Asthma    Chicken pox    Chronic headaches    Diverticulitis    Essential hypertension    GERD (gastroesophageal reflux disease)    Guillain Barr syndrome ([place]) 1982   H/O breast implant    Hypothyroidism    Interstitial cystitis    Mitral prolapse 1985   Nutcracker esophagus     Past Surgical History:  Procedure Laterality Date   ABDOMINAL HYSTERECTOMY  2000/2009   APPENDECTOMY  2007   BOWEL RESECTION  2007   CARDIAC CATHETERIZATION  2016   Clean   CARPAL TUNNEL RELEASE Right    CHOLECYSTECTOMY  2012   CHOLECYSTECTOMY  2012   NECK SURGERY  06/11/2016   plates and screws in neck    SMALL INTESTINE  SURGERY  2007   Bowel resection   TONSILLECTOMY     VESICO-VAGINAL FISTULA REPAIR  2007     Medications:  Outpatient Encounter Medications as of 03/30/2019  Medication Sig   amLODipine (NORVASC) 5 MG tablet Take 1 tablet by mouth once daily for blood pressure.   amphetamine-dextroamphetamine (ADDERALL) 20 MG tablet TAKE 1 TABLET BY MOUTH 3 TIMES DAILY AFTER MEALS.   BD PEN NEEDLE NANO U/F 32G X 4 MM MISC USE ONE  4 TIMES DAILY WITH  INSULIN  PEN   cyanocobalamin (,VITAMIN B-12,) 1000 MCG/ML injection INJECT 1 VIAL ( 1ML) INTO THE MUSCLE EVERY 3 TO 4 WEEKS    diazepam (VALIUM) 5 MG tablet Take 1 tablet (5 mg total) by mouth every 8 (eight) hours as needed for anxiety.   EPINEPHrine 0.3 mg/0.3 mL IJ SOAJ injection Inject 0.3 mLs (0.3 mg total) into the muscle as needed for anaphylaxis.   escitalopram (LEXAPRO) 10 MG tablet Take 1 tablet (10 mg total) by mouth daily.   levothyroxine (SYNTHROID) 112 MCG tablet Take 1 tablet by mouth every morning on an empty stomach. No food or other medications for 30 minutes.   magic mouthwash SOLN Take 15 mLs by mouth 3 (three) times daily as needed for mouth pain (Modified Magic Mouthwash).   olmesartan (BENICAR) 20 MG tablet TAKE 1 TABLET BY MOUTH TWICE A DAY   oxyCODONE (OXY IR/ROXICODONE) 5 MG immediate release tablet    pantoprazole (PROTONIX) 40 MG tablet TAKE 1 TABLET BY MOUTH TWICE (2) DAILY   pregabalin (LYRICA) 75 MG capsule 75 mg 2 (two) times daily.    SYMBICORT 160-4.5 MCG/ACT inhaler INHALE 2 PUFFS INTO THE LUNGS TWICE DAILY   VENTOLIN HFA 108 (90 Base) MCG/ACT inhaler INHALE 2 PUFFS INTO THE LUNGS EVERY 6 HOURS AS NEEDED FOR WHEEZING ORSHORTNESS OF BREATH.   [DISCONTINUED] amoxicillin-clavulanate (AUGMENTIN) 875-125 MG tablet Take 1 tablet by mouth 2 (two) times daily.   No facility-administered encounter medications on file as of 03/30/2019.     Allergies:  Allergies  Allergen Reactions   Bactrim [Sulfamethoxazole-Trimethoprim] Hives   Macrobid [Nitrofurantoin Macrocrystal] Hives   Tizanidine Other (See Comments)    Bad Mood changs   Vicodin [Hydrocodone-Acetaminophen] Swelling    Puffy eyes and face   Trileptal [Oxcarbazepine] Rash   Hydrocodone    Nexium [Esomeprazole Magnesium]     Face swelling, hives    Prilosec [Omeprazole]     Face swelling, hives    Sulfa Antibiotics    Erythromycin Rash    Family History: Family History  Problem Relation Age of Onset   Hypertension Mother    GER disease Mother    Polymyalgia rheumatica Mother    Arthritis Mother      GER disease Father     Social History: Social History   Tobacco Use   Smoking status: Never Smoker   Smokeless tobacco: Never Used  Substance Use Topics   Alcohol use: Yes    Comment: socially   Drug use: No   Social History   Social History Narrative   Lives with husband   Caffeine use: rare   Right handed   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      Review of Systems:  CONSTITUTIONAL: No fevers, chills, night sweats, or weight loss.   EYES: No visual changes or eye pain ENT: No hearing changes.  No history of nose bleeds.   RESPIRATORY: No cough, wheezing and shortness of breath.   CARDIOVASCULAR: Negative for chest pain, and palpitations.  GI: Negative for abdominal discomfort, blood in stools or black stools.  No recent change in bowel habits.   GU:  No history of incontinence.   MUSCLOSKELETAL: No history of joint pain or swelling.  No myalgias.   SKIN: Negative for lesions, rash, and itching.   HEMATOLOGY/ONCOLOGY: Negative for prolonged bleeding, bruising easily, and swollen nodes.  No history of cancer.   ENDOCRINE: Negative for cold or heat intolerance, polydipsia or goiter.   PSYCH:  No depression or anxiety symptoms.   NEURO: As Above.   Vital Signs:  BP (!) 148/78    Ht 5\' 3"  (1.6 m)    Wt 158 lb (71.7 kg)    BMI 27.99 kg/m    General Medical Exam:   General:  Anxious-appearing, tearful at times, mild agitation.   Eyes/ENT: see cranial nerve examination.   Neck:   No carotid bruits. Respiratory:  Clear to auscultation, good air entry bilaterally.   Cardiac:  Regular rate and rhythm, no murmur.   Extremities:  No deformities, edema, or skin discoloration.  Skin:  No rashes or lesions.  Neurological Exam: MENTAL STATUS including orientation to time, place, person is normal.  She has scattered attention and is slow to answer questions and hesitates when asked to perform simple tasks (i.e.touch your nose).   Speech is not dysarthric.  CRANIAL NERVES: II:  No visual  field defects.    III-IV-VI: Pupils equal round and reactive to light.  Normal conjugate, extra-ocular eye movements in all directions of gaze.  No nystagmus.  No ptosis.   V:  Normal facial sensation.    VII:  Normal facial symmetry and movements.   VIII:  Normal hearing and vestibular function.   IX-X:  Normal palatal movement.   XI:  Normal shoulder shrug and head rotation.   XII:  Normal tongue strength and range of motion, no deviation or fasciculation.  MOTOR:  There is a global pattern of give-way weakness, but with encouragement and repeated effort all muscle groups are 5/5  No atrophy, fasciculations or abnormal movements.  No pronator drift.   Upper Extremity:  Right  Left  Deltoid  5/5   5/5   Biceps  5/5   5/5   Triceps  5/5   5/5   Infraspinatus 5/5  5/5  Medial pectoralis 5/5  5/5  Wrist extensors  5/5   5/5   Wrist flexors  5/5   5/5   Finger extensors  5/5   5/5   Finger flexors  5/5   5/5   Dorsal interossei  5/5   5/5   Abductor pollicis  5/5   5/5   Tone (Ashworth scale)  0  0   Lower Extremity:  Right  Left  Hip flexors  5/5   5/5   Hip extensors  5/5   5/5   Adductor 5/5  5/5  Abductor 5/5  5/5  Knee flexors  5/5   5/5   Knee extensors  5/5   5/5   Dorsiflexors  5/5   5/5   Plantarflexors  5/5   5/5   Toe extensors  5/5   5/5   Toe flexors  5/5   5/5   Tone (Ashworth scale)  0  0   MSRs:   Reflexes are brisk and symmetric throughout Right        Left                  brachioradialis 2+  2+  biceps 2+  2+  triceps 2+  2+  patellar 2+  2+  ankle jerk 2+  2+  Hoffman no  no  plantar response down  down   SENSORY:  Sensation to all modalities is absent in the extremities including temperature, pin prick and vibration.  There is no sensory level.  She reports no sensation over the back.  COORDINATION/GAIT: Normal finger-to- nose-finger.  Inconsistent finger tapping. Gait is appears non-physiological, with short steps, trying to reach for the table,  unassisted.  IMPRESSION: Diffuse paresthesias which extend beyond any neurological distribution in the setting of preserved motor strength and reflexes is not typical for neuropathy and suggestive of somatoform disorder, as mentioned by Dr. Jannifer Franklin.  She does not have Guillaine-Barre syndrome as distal strength and reflexes are normal.  I had a very long discussion with patient regarding neuroanatomy and neural pathways and how her symptoms do not fit what we see with neurological conditions.  Widespread pain and symptoms is likely due to chronic pain syndrome such as fibromyalgia and overlay of stress reaction.  I suggested that we can repeat NCS/EMG, but they tell me that it was recently performed at Dr. Windy Carina office.  I will have them sign a release and review the NCS/EMGs and give my opinion.    Thank you for allowing me to participate in patient's care.  If I can answer any additional questions, I would be pleased to do so.    Sincerely,    Mahesh Sizemore K. Posey Pronto, DO

## 2019-03-31 ENCOUNTER — Ambulatory Visit (INDEPENDENT_AMBULATORY_CARE_PROVIDER_SITE_OTHER): Payer: 59 | Admitting: Psychiatry

## 2019-03-31 DIAGNOSIS — S069X9S Unspecified intracranial injury with loss of consciousness of unspecified duration, sequela: Secondary | ICD-10-CM

## 2019-03-31 DIAGNOSIS — F410 Panic disorder [episodic paroxysmal anxiety] without agoraphobia: Secondary | ICD-10-CM

## 2019-03-31 DIAGNOSIS — S069XAS Unspecified intracranial injury with loss of consciousness status unknown, sequela: Secondary | ICD-10-CM

## 2019-03-31 DIAGNOSIS — F0630 Mood disorder due to known physiological condition, unspecified: Secondary | ICD-10-CM | POA: Diagnosis not present

## 2019-03-31 MED ORDER — DIAZEPAM 5 MG PO TABS: 5.0000 mg | ORAL_TABLET | Freq: Three times a day (TID) | ORAL | 2 refills | Status: AC | PRN

## 2019-03-31 MED ORDER — DULOXETINE HCL 30 MG PO CPEP: ORAL_CAPSULE | ORAL | 0 refills | Status: DC

## 2019-03-31 MED ORDER — AMPHETAMINE-DEXTROAMPHETAMINE 20 MG PO TABS: ORAL_TABLET | ORAL | 0 refills | Status: DC
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
## 2019-04-29 ENCOUNTER — Ambulatory Visit: Payer: 59 | Admitting: Podiatry

## 2019-05-11 ENCOUNTER — Other Ambulatory Visit (HOSPITAL_COMMUNITY): Payer: Self-pay | Admitting: Psychiatry

## 2019-05-12 ENCOUNTER — Ambulatory Visit (INDEPENDENT_AMBULATORY_CARE_PROVIDER_SITE_OTHER): Payer: 59 | Admitting: Psychiatry

## 2019-05-12 ENCOUNTER — Other Ambulatory Visit: Payer: Self-pay

## 2019-05-12 DIAGNOSIS — S069X9S Unspecified intracranial injury with loss of consciousness of unspecified duration, sequela: Secondary | ICD-10-CM | POA: Diagnosis not present

## 2019-05-12 DIAGNOSIS — S069XAS Unspecified intracranial injury with loss of consciousness status unknown, sequela: Secondary | ICD-10-CM

## 2019-05-12 DIAGNOSIS — F410 Panic disorder [episodic paroxysmal anxiety] without agoraphobia: Secondary | ICD-10-CM

## 2019-05-12 DIAGNOSIS — F0630 Mood disorder due to known physiological condition, unspecified: Secondary | ICD-10-CM

## 2019-05-12 MED ORDER — AMPHETAMINE-DEXTROAMPHETAMINE 20 MG PO TABS: ORAL_TABLET | ORAL | 0 refills | Status: DC

## 2019-05-12 MED ORDER — ESCITALOPRAM OXALATE 10 MG PO TABS: 10.0000 mg | ORAL_TABLET | Freq: Every day | ORAL | 2 refills | Status: DC
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
## 2019-05-13 ENCOUNTER — Ambulatory Visit: Payer: 59 | Admitting: Podiatry

## 2019-05-13 ENCOUNTER — Other Ambulatory Visit: Payer: Self-pay

## 2019-05-13 ENCOUNTER — Encounter: Payer: Self-pay | Admitting: Podiatry

## 2019-05-13 DIAGNOSIS — G629 Polyneuropathy, unspecified: Secondary | ICD-10-CM | POA: Diagnosis not present

## 2019-05-13 DIAGNOSIS — M2042 Other hammer toe(s) (acquired), left foot: Secondary | ICD-10-CM

## 2019-05-13 DIAGNOSIS — M779 Enthesopathy, unspecified: Secondary | ICD-10-CM | POA: Diagnosis not present

## 2019-05-13 NOTE — Progress Notes (Signed)
Subjective:   Patient ID: Katie Robbins, female   DOB: 64 y.o.   MRN: [id number]   HPI Patient states that the area that was work on is doing better and I am still concerned about my digital deformity and pain that I get on the pads of both feet   ROS      Objective:  Physical Exam  Neurovascular status intact with patient second digit left still moderately elevated with redness on top of the toe with trauma to the joint itself.  There is mild discomfort in the pads of both feet localized     Assessment:  Inflammatory capsulitis of the localized nature with probability for flexor plate stretch with elevated second toe left that has responded to conservative treatment that I had done     Plan:  H&P reviewed condition and discussed that we may need to make a different type of orthotic next year.  I explained this to patient and I reviewed and also discussed possibility for digital fusion shortening osteotomy if symptoms were to get worse or persist.  Patient will be seen back to reevaluate after extensive review today

## 2019-05-14 ENCOUNTER — Other Ambulatory Visit (HOSPITAL_COMMUNITY): Payer: Self-pay | Admitting: Psychiatry

## 2019-05-14 MED ORDER — AMPHETAMINE-DEXTROAMPHETAMINE 20 MG PO TABS: ORAL_TABLET | ORAL | 0 refills | Status: DC

## 2019-05-18 ENCOUNTER — Encounter (HOSPITAL_COMMUNITY): Payer: Self-pay | Admitting: Psychiatry

## 2019-05-18 ENCOUNTER — Other Ambulatory Visit: Payer: Self-pay

## 2019-05-18 ENCOUNTER — Ambulatory Visit (INDEPENDENT_AMBULATORY_CARE_PROVIDER_SITE_OTHER): Payer: 59 | Admitting: Psychiatry

## 2019-05-18 DIAGNOSIS — S069XAS Unspecified intracranial injury with loss of consciousness status unknown, sequela: Secondary | ICD-10-CM

## 2019-05-18 DIAGNOSIS — S069X9S Unspecified intracranial injury with loss of consciousness of unspecified duration, sequela: Secondary | ICD-10-CM | POA: Diagnosis not present

## 2019-05-18 DIAGNOSIS — F064 Anxiety disorder due to known physiological condition: Secondary | ICD-10-CM

## 2019-05-18 DIAGNOSIS — F0630 Mood disorder due to known physiological condition, unspecified: Secondary | ICD-10-CM

## 2019-05-18 NOTE — Progress Notes (Signed)
Virtual Visit via Video Note  I connected with [name] on 05/18/19 at  2:00 PM EST by a video enabled telemedicine application and verified that I am speaking with the correct person using two identifiers.  Location: Patient: Katie Robbins Provider: [name], LCSW   I discussed the limitations of evaluation and management by telemedicine and the availability of in person appointments. The patient expressed understanding and agreed to proceed.  History of Present Illness: Mood Disorder due to TBI and Anxiety due to medical condition   Observations/Objective: Counselor met with Katie Robbins for individual therapy via Webex. Counselor assessed MH symptoms and progress on treatment plan goals. Katie Robbins presents with mild depression and moderate anxiety. Katie Robbins denied suicidal ideation or self-harm behaviors. Donald shared that due to medical conditions she has been experiencing fluctuating moods, extreme fatigue, memory problems, irritability and low self-confidence. Counselor processed thoughts and feelings using CBT and MI interventions, identifying helpful coping strategies to address concerns. Counselor recommends follow up with providers and suggested looking into Neurofeedback/[facility] to do supplemental testing. Counselor to connect [place] with support group links and information for additional support/treatment.   Assessment and Plan: Counselor will continue to meet with patient to address treatment plan goals. Patient will continue to follow recommendations of providers and implement skills learned in session.  Follow Up Instructions: Counselor will send information for next session via Webex.     I discussed the assessment and treatment plan with the patient. The patient was provided an opportunity to ask questions and all were answered. The patient agreed with the plan and demonstrated an understanding of the instructions.   The patient was advised to call back or seek an  in-person evaluation if the symptoms worsen or if the condition fails to improve as anticipated.  I provided 50 minutes of non-face-to-face time during this encounter.   [name], LCSW

## 2019-05-22 ENCOUNTER — Other Ambulatory Visit: Payer: Self-pay

## 2019-05-22 DIAGNOSIS — R200 Anesthesia of skin: Secondary | ICD-10-CM

## 2019-05-22 DIAGNOSIS — R202 Paresthesia of skin: Secondary | ICD-10-CM

## 2019-05-22 NOTE — Telephone Encounter (Signed)
Received message requesting refill on Lyrica for patient. Person that left a message was a gentleman but did not leave his name, I assume it is her husband and he asked for a call back when this is done. I can not tell that this medication has been filled by Katie Robbins before. Please review. Thank you

## 2019-05-22 NOTE — Telephone Encounter (Signed)
I believe this goes to podiatry? Vallarie Mare who has been filling this?  Can we send to prescribing MD?

## 2019-05-25 ENCOUNTER — Ambulatory Visit (INDEPENDENT_AMBULATORY_CARE_PROVIDER_SITE_OTHER): Payer: 59 | Admitting: Psychiatry

## 2019-05-25 ENCOUNTER — Encounter (HOSPITAL_COMMUNITY): Payer: Self-pay | Admitting: Psychiatry

## 2019-05-25 ENCOUNTER — Other Ambulatory Visit: Payer: Self-pay

## 2019-05-25 DIAGNOSIS — F0630 Mood disorder due to known physiological condition, unspecified: Secondary | ICD-10-CM

## 2019-05-25 DIAGNOSIS — S069X9S Unspecified intracranial injury with loss of consciousness of unspecified duration, sequela: Secondary | ICD-10-CM | POA: Diagnosis not present

## 2019-05-25 DIAGNOSIS — S069XAS Unspecified intracranial injury with loss of consciousness status unknown, sequela: Secondary | ICD-10-CM

## 2019-05-25 DIAGNOSIS — F064 Anxiety disorder due to known physiological condition: Secondary | ICD-10-CM | POA: Diagnosis not present

## 2019-05-25 NOTE — Progress Notes (Signed)
Virtual Visit via Video Note  I connected with Katie Robbins on 05/25/19 at  2:00 PM EST by a video enabled telemedicine application and verified that I am speaking with the correct person using two identifiers.  Location: Patient: Katie Robbins Provider: [name], LCSW   I discussed the limitations of evaluation and management by telemedicine and the availability of in person appointments. The patient expressed understanding and agreed to proceed.  History of Present Illness: Mood Disorder related to TBI and anxiety due to medical conditions   Observations/Objective: Counselor met with Katie Robbins for individual therapy via Webex. Counselor assessed MH symptoms and progress on treatment plan goals. Katie Robbins presents with moderate anxiety and moderate depression. Katie Robbins denied suicidal ideation or self-harm behaviors. Katie Robbins shared that since our last session she had been extremely ill due to inability to fill and administer a medication. Counselor assessed her daily functioning, with Katie Robbins describing inability to get out of bed, needing husband to assist her in walking, household responsibilities and eating. Katie Robbins endorsed that her symptoms have since improved, as she was able to start taking medications. Counselor assessed how mood and mental health has been impacted, discussing strategies in improved communication of needs with lowering emotional and reactive responses. Counselor and [place] processed needs in relation to discharge planning.   Assessment and Plan: Counselor will continue to meet with patient to address treatment plan goals. Patient will continue to follow recommendations of providers and implement skills learned in session.   Follow Up Instructions: Counselor will send information for next session via Webex.     I discussed the assessment and treatment plan with the patient. The patient was provided an opportunity to ask questions and all were answered. The patient agreed with the  plan and demonstrated an understanding of the instructions.   The patient was advised to call back or seek an in-person evaluation if the symptoms worsen or if the condition fails to improve as anticipated.  I provided 60 minutes of non-face-to-face time during this encounter.   [name], LCSW

## 2019-05-25 NOTE — Telephone Encounter (Signed)
Patient states that she has been getting this from her Neurosurgeon, Dr. Saintclair Halsted. She states that he is just very hard to get up with for refills, as he has multiple surgeries through out the week and he is very busy, and she always runs out of her medicines before he can refill them - so she thought it would be easier if she could get this refilled through [facility]?

## 2019-05-25 NOTE — Telephone Encounter (Signed)
Message left for patient to return my call.  

## 2019-05-26 NOTE — Telephone Encounter (Signed)
Based off of PMP aware It looks like Dr. Saintclair Halsted sent refills for this on 05/22/19. Have her check with her pharmacy.

## 2019-05-27 NOTE — Telephone Encounter (Signed)
Message left for patient to return my call.  

## 2019-06-01 ENCOUNTER — Other Ambulatory Visit: Payer: Self-pay

## 2019-06-01 ENCOUNTER — Ambulatory Visit (INDEPENDENT_AMBULATORY_CARE_PROVIDER_SITE_OTHER): Payer: 59 | Admitting: Psychiatry

## 2019-06-01 ENCOUNTER — Encounter (HOSPITAL_COMMUNITY): Payer: Self-pay | Admitting: Psychiatry

## 2019-06-01 DIAGNOSIS — F064 Anxiety disorder due to known physiological condition: Secondary | ICD-10-CM

## 2019-06-01 DIAGNOSIS — S069X9S Unspecified intracranial injury with loss of consciousness of unspecified duration, sequela: Secondary | ICD-10-CM

## 2019-06-01 DIAGNOSIS — S069XAS Unspecified intracranial injury with loss of consciousness status unknown, sequela: Secondary | ICD-10-CM

## 2019-06-01 DIAGNOSIS — F0630 Mood disorder due to known physiological condition, unspecified: Secondary | ICD-10-CM

## 2019-06-01 NOTE — Progress Notes (Signed)
Virtual Visit via Video Note  I connected with Katie Robbins on 06/01/19 at  2:00 PM EST by a video enabled telemedicine application and verified that I am speaking with the correct person using two identifiers.  Location: Patient: Katie Robbins Provider: [name], LCSW   I discussed the limitations of evaluation and management by telemedicine and the availability of in person appointments. The patient expressed understanding and agreed to proceed.  History of Present Illness: Mood Disorder due to TBI and Anxiety due to medical condition   Observations/Objective: Counselor met with Katie Robbins for individual therapy via Webex. Counselor assessed MH symptoms and progress on treatment plan goals. Katie Robbins presents with moderate anxiety and moderate depression. Oma denied suicidal ideation or self-harm behaviors. Avalin shared that she continues to notice that she is "moody" and "snappy" with her husband, even when she does not want to be. Counselor shared psychoeducation about the impact of mood expression post TBI. Counselor provided Katie Robbins and her husband with 3 articles and 2 books to purchase to work more on their understanding and application of skills in these areas Counselor assessed daily functioning, with Katie Robbins reporting chronic fatigue, increased sleep during the day from sleeping in, pain in her feet and extremities, along with swelling and nerve responses. Counselor promoted taking medications as prescribed, following doctors recommendations and looking into more guidance from specialists.   Assessment and Plan: Counselor will continue to meet with patient to address treatment plan goals. Patient will continue to follow recommendations of providers and implement skills learned in session.  Follow Up Instructions: Counselor will send information for next session via Webex.     I discussed the assessment and treatment plan with the patient. The patient was provided an opportunity to ask  questions and all were answered. The patient agreed with the plan and demonstrated an understanding of the instructions.   The patient was advised to call back or seek an in-person evaluation if the symptoms worsen or if the condition fails to improve as anticipated.  I provided 55 minutes of non-face-to-face time during this encounter.   [name], LCSW

## 2019-06-04 ENCOUNTER — Other Ambulatory Visit (HOSPITAL_COMMUNITY): Payer: Self-pay | Admitting: Psychiatry

## 2019-06-04 ENCOUNTER — Other Ambulatory Visit: Payer: Self-pay | Admitting: Primary Care

## 2019-06-04 DIAGNOSIS — E538 Deficiency of other specified B group vitamins: Secondary | ICD-10-CM

## 2019-06-04 MED ORDER — AMPHETAMINE-DEXTROAMPHETAMINE 20 MG PO TABS: ORAL_TABLET | ORAL | 0 refills | Status: DC

## 2019-06-05 NOTE — Telephone Encounter (Signed)
Last prescribed on 02/25/2018 . Last appointment on  01/27/2019. No future appointment

## 2019-06-05 NOTE — Telephone Encounter (Signed)
Refill sent to pharmacy.   

## 2019-06-08 ENCOUNTER — Other Ambulatory Visit: Payer: Self-pay

## 2019-06-08 ENCOUNTER — Ambulatory Visit (INDEPENDENT_AMBULATORY_CARE_PROVIDER_SITE_OTHER): Payer: 59 | Admitting: Psychiatry

## 2019-06-08 ENCOUNTER — Encounter (HOSPITAL_COMMUNITY): Payer: Self-pay | Admitting: Psychiatry

## 2019-06-08 DIAGNOSIS — F064 Anxiety disorder due to known physiological condition: Secondary | ICD-10-CM

## 2019-06-08 DIAGNOSIS — F0630 Mood disorder due to known physiological condition, unspecified: Secondary | ICD-10-CM

## 2019-06-08 DIAGNOSIS — S069X9S Unspecified intracranial injury with loss of consciousness of unspecified duration, sequela: Secondary | ICD-10-CM | POA: Diagnosis not present

## 2019-06-08 DIAGNOSIS — S069XAS Unspecified intracranial injury with loss of consciousness status unknown, sequela: Secondary | ICD-10-CM

## 2019-06-08 NOTE — Progress Notes (Signed)
Virtual Visit via Video Note  I connected with Katie Robbins on 06/08/19 at  2:00 PM EST by a video enabled telemedicine application and verified that I am speaking with the correct person using two identifiers.  Individual Client Notes:  Location: Patient: Patient Home Provider: Home Office   I discussed the limitations of evaluation and management by telemedicine and the availability of in person appointments. The patient expressed understanding and agreed to proceed.  History of Present Illness: Mood Disorder due to TBI and Anxiety due to medical condition   Treatment Plan Goals: Katie Robbins will learn coping skills to better manage anxiety and depressive symptoms to increase level of functioning. She would like to improve self-esteem, and accept her new idenity post-TBI.   Observations/Objective: Counselor met with Katie Robbins for individual therapy via Webex. Counselor assessed MH symptoms and progress on treatment plan goals, with patient reporting continued struggle to accept lack of ability to function as she did before the TBI, less energy, more pain, mood fluctuations, and change in appearance. Katie Robbins presents with moderate depression and moderate anxiety. Katie Robbins denied suicidal ideation or self-harm behaviors.   Katie Robbins shared that she has regressed in issues of elimination and balance issues, which has impacted her emotions, self-confidence and self-esteem, causing embarrassment and anger toward health conditions and lack of medical clarity on accurate diagnosis and treatment, per her report. Counselor explored thoughts, feelings and impact on daily functioning using CBT interventions. Counselor promoted following up with providers, communicating needs and feelings with her spouse and continuation of anger journal. Katie Robbins responded favorably to implementation of skills and strategies.   Assessment and Plan: Counselor will continue to meet with patient to address treatment plan goals. Patient will  continue to follow recommendations of providers and implement skills learned in session.  Follow Up Instructions: Counselor will send information for next session via Webex.    The patient was advised to call back or seek an in-person evaluation if the symptoms worsen or if the condition fails to improve as anticipated.  I provided 55 minutes of non-face-to-face time during this encounter.   [name], LCSW

## 2019-06-22 ENCOUNTER — Other Ambulatory Visit: Payer: Self-pay

## 2019-06-22 ENCOUNTER — Ambulatory Visit (INDEPENDENT_AMBULATORY_CARE_PROVIDER_SITE_OTHER): Payer: 59 | Admitting: Psychiatry

## 2019-06-22 ENCOUNTER — Encounter (HOSPITAL_COMMUNITY): Payer: Self-pay | Admitting: Psychiatry

## 2019-06-22 DIAGNOSIS — F064 Anxiety disorder due to known physiological condition: Secondary | ICD-10-CM

## 2019-06-22 DIAGNOSIS — S069X9S Unspecified intracranial injury with loss of consciousness of unspecified duration, sequela: Secondary | ICD-10-CM | POA: Diagnosis not present

## 2019-06-22 DIAGNOSIS — S069XAS Unspecified intracranial injury with loss of consciousness status unknown, sequela: Secondary | ICD-10-CM

## 2019-06-22 DIAGNOSIS — F0630 Mood disorder due to known physiological condition, unspecified: Secondary | ICD-10-CM

## 2019-06-22 NOTE — Progress Notes (Signed)
Virtual Visit via Video Note  I connected with Katie Robbins on 06/22/19 at  1:00 PM EST by a video enabled telemedicine application and verified that I am speaking with the correct person using two identifiers.  Location: Patient: Patient Home Provider: Home Office   I discussed the limitations of evaluation and management by telemedicine and the availability of in person appointments. The patient expressed understanding and agreed to proceed.  History of Present Illness: Mood Disorder related to TBI and Anxiety due to medical conditions   Treatment Plan Goals: Katie Robbins will learn coping skills to better manage anxiety and depressive symptoms to increase level of functioning. She would like to improve self-esteem, and accept hernew identity post-TBI.  Observations/Objective: Counselor met with Katie Robbins for individual therapy via Webex. Counselor assessed MH symptoms and progress on treatment plan goals, with patient reporting increase in irritability, memory issues, frustrations and ongoing sleep issues.  Katie Robbins presents with moderate depression and moderate anxiety. Katie Robbins denied suicidal ideation or self-harm behaviors.   Katie Robbins shared that she has been feeling "lousy", her sleep routine is off, which is causing medication management to the off, which is causing more memory issues, pain and miscommunication with her spouse, which causes agitation and frustration from both she and her husband. Counselor assessed daily functioning and we worked to develop strategies to form more structure and routine to decrease above symptoms and issues. Katie Robbins believes she can implement the tweaks in her daily habits and routines to be more balanced and healthy in managing her physical and mental health. She plans to discuss medication questions with her psychiatrist and primary care. Counselor and Katie Robbins discussed and reviewed concepts from the post-TBI workbook [place] purchased and has been completing. She plans to make  notes and process information additionally with her husband to improve communication and application of skills/concepts. Counselor and Sola discussed discharge planning. Katie Robbins will be transferred to a [facility] provider and plans to attend support groups and wellness academy at [facility].    Assessment and Plan: Counselor will continue to meet with patient to address treatment plan goals. Patient will continue to follow recommendations of providers and implement skills learned in session.  Follow Up Instructions: Counselor will send information for next session via Webex.    The patient was advised to call back or seek an in-person evaluation if the symptoms worsen or if the condition fails to improve as anticipated.  I provided 55 minutes of non-face-to-face time during this encounter.   [name], LCSW

## 2019-06-22 NOTE — Addendum Note (Signed)
Addended by: [name] on: 06/22/2019 05:18 PM   Modules accepted: Level of Service

## 2019-06-30 ENCOUNTER — Encounter: Payer: Self-pay | Admitting: Primary Care

## 2019-06-30 ENCOUNTER — Other Ambulatory Visit: Payer: Self-pay

## 2019-06-30 ENCOUNTER — Ambulatory Visit: Payer: 59 | Admitting: Primary Care

## 2019-06-30 VITALS — BP 134/70 | HR 94 | Temp 97.1°F | Ht 63.0 in | Wt 163.5 lb

## 2019-06-30 DIAGNOSIS — R7303 Prediabetes: Secondary | ICD-10-CM

## 2019-06-30 DIAGNOSIS — F410 Panic disorder [episodic paroxysmal anxiety] without agoraphobia: Secondary | ICD-10-CM

## 2019-06-30 DIAGNOSIS — E039 Hypothyroidism, unspecified: Secondary | ICD-10-CM

## 2019-06-30 DIAGNOSIS — G3184 Mild cognitive impairment, so stated: Secondary | ICD-10-CM

## 2019-06-30 DIAGNOSIS — R300 Dysuria: Secondary | ICD-10-CM

## 2019-06-30 DIAGNOSIS — R202 Paresthesia of skin: Secondary | ICD-10-CM

## 2019-06-30 DIAGNOSIS — S069X9S Unspecified intracranial injury with loss of consciousness of unspecified duration, sequela: Secondary | ICD-10-CM | POA: Diagnosis not present

## 2019-06-30 DIAGNOSIS — M19041 Primary osteoarthritis, right hand: Secondary | ICD-10-CM | POA: Diagnosis not present

## 2019-06-30 DIAGNOSIS — N183 Chronic kidney disease, stage 3 unspecified: Secondary | ICD-10-CM

## 2019-06-30 DIAGNOSIS — IMO0001 Reserved for inherently not codable concepts without codable children: Secondary | ICD-10-CM

## 2019-06-30 DIAGNOSIS — M19042 Primary osteoarthritis, left hand: Secondary | ICD-10-CM

## 2019-06-30 DIAGNOSIS — R200 Anesthesia of skin: Secondary | ICD-10-CM

## 2019-06-30 LAB — POC URINALSYSI DIPSTICK (AUTOMATED)
Blood, UA: NEGATIVE
Glucose, UA: NEGATIVE
Leukocytes, UA: NEGATIVE
Nitrite, UA: NEGATIVE
Protein, UA: NEGATIVE
Spec Grav, UA: 1.025 (ref 1.010–1.025)
Urobilinogen, UA: 0.2 E.U./dL
pH, UA: 5 (ref 5.0–8.0)

## 2019-06-30 LAB — BASIC METABOLIC PANEL
BUN: 21 mg/dL (ref 6–23)
CO2: 27 mEq/L (ref 19–32)
Calcium: 9.2 mg/dL (ref 8.4–10.5)
Chloride: 102 mEq/L (ref 96–112)
Creatinine, Ser: 1.12 mg/dL (ref 0.40–1.20)
GFR: 49.02 mL/min — ABNORMAL LOW (ref >60.00)
Glucose, Bld: 97 mg/dL (ref 70–99)
Potassium: 4.5 mEq/L (ref 3.5–5.1)
Sodium: 136 mEq/L (ref 135–145)

## 2019-06-30 LAB — HEMOGLOBIN A1C: Hgb A1c MFr Bld: 6 % (ref 4.6–6.5)

## 2019-06-30 LAB — TSH: TSH: 0.04 u[IU]/mL — ABNORMAL LOW (ref 0.35–4.50)

## 2019-06-30 NOTE — Assessment & Plan Note (Signed)
Working with psychiatry and therapy. Continue Lexapro and Adderall.

## 2019-06-30 NOTE — Assessment & Plan Note (Signed)
Chronic and persistent to bilateral upper and lower extremities. Now on Lyrica that was initiated by neurosurgery a few months ago. She is working on weaning off.

## 2019-06-30 NOTE — Assessment & Plan Note (Signed)
Continues to follow with psychiatry and therapy. Continue Adderall 20 mg TID and Lexapro 10 mg.

## 2019-06-30 NOTE — Addendum Note (Signed)
Addended by: Jacqualin Combes on: 06/30/2019 10:40 AM   Modules accepted: Orders

## 2019-06-30 NOTE — Assessment & Plan Note (Signed)
Following with nephrology. Continue ARB. Appointment due in late February 2021.

## 2019-06-30 NOTE — Assessment & Plan Note (Signed)
Repeat TSH pending. Compliant to levothyroxine 112 mcg.

## 2019-06-30 NOTE — Patient Instructions (Addendum)
Stop by the lab prior to leaving today. I will notify you of your results once received.   Continue to work on a healthy diet. Increase activity level when able.  Ensure you are consuming 64 ounces of water daily.  It was a pleasure to see you today!

## 2019-06-30 NOTE — Assessment & Plan Note (Signed)
Repeat A1C pending.  Discussed the importance of a healthy diet and regular exercise in order for weight loss, and to reduce the risk of any potential medical problems.  

## 2019-06-30 NOTE — Progress Notes (Signed)
Subjective:    Patient ID: Katie Robbins, female    DOB: 1955-10-26, 64 y.o.   MRN: [id number]  HPI  This visit occurred during the SARS-CoV-2 public health emergency.  Safety protocols were in place, including screening questions prior to the visit, additional usage of staff PPE, and extensive cleaning of exam room while observing appropriate contact time as indicated for disinfecting solutions.   Ms. 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   Currently following with psychiatry, therapy, neurosurgery, neurology, nephrology, GYN.   December was a tough month, she could hardly get out of bed. She was prescribed oxycodone and Lyrica by her neurosurgeon. She has recently reduced her Lyrica dose to once daily in the morning, off of the oxycodone. She finds that her pain is worse when rising after sitting still for prolonged periods of time. Also pain with increased activity, for example running a few errands. Tylenol doesn't help with pain, cannot take NSAID's due to GI upset.   She is struggling with difficulty sleeping at times, once she shuts her brain off she'll sleep 10-12 hours. She will go to bed around 9:30 pm, lays awake for 1-2 hours. She doesn't watch TV or look at a screen within one hour of bed. She mostly reads. Doesn't feel drowsy with Lyrica. Her psychiatrist added Lexapro 10 mg in late December 2020, she continues with Adderall 20 mg TID.   Also noticing intermittent urinary stress incontinence that occurs once every few weeks. Last night she wet her pajamas after coughing.   BP Readings from Last 3 Encounters:  06/30/19 134/70  03/30/19 (!) 148/78  03/12/19 130/66   Wt Readings from Last 3 Encounters:  06/30/19 163 lb 8 oz  (74.2 kg)  03/30/19 158 lb (71.7 kg)  03/12/19 160 lb 5 oz (72.7 kg)   The 10-year ASCVD risk score Mikey Bussing DC Jr., et al., 2013) is: 5.7%   Values used to calculate the score:     Age: 64 years     Sex: Female     Is Non-Hispanic African American: No     Diabetic: No     Tobacco smoker: No     Systolic Blood Pressure: [id number] mmHg     Is BP treated: Yes     HDL Cholesterol: 77.8 mg/dL     Total Cholesterol: 209 mg/dL    Review of Systems  Constitutional: Positive for fatigue.  Musculoskeletal: Positive for arthralgias and joint swelling.  Neurological: Positive for headaches.  Psychiatric/Behavioral: Positive for sleep disturbance.       Past Medical History:  Diagnosis Date  . Anxiety   . Arthritis   . Asthma   . Chicken pox   . Chronic headaches   . Diverticulitis   . Essential hypertension   . GERD (gastroesophageal reflux disease)   . Guillain Barr syndrome ([place]) 1982  . H/O breast implant   . Hypothyroidism   . Interstitial cystitis   . Mitral prolapse 1985  . Nutcracker esophagus      Social History   Socioeconomic History  . Marital status: Married    Spouse name: [name]  . Number of children: 2  . Years of education: Some college  . Highest education level: Not on file  Occupational History  . Occupation: Retired  Tobacco Use  .  Smoking status: Never Smoker  . Smokeless tobacco: Never Used  Substance and Sexual Activity  . Alcohol use: Yes    Comment: socially  . Drug use: No  . Sexual activity: Yes  Other Topics Concern  . 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     Social Determinants of Health   Financial Resource Strain:   . Difficulty of Paying Living Expenses: Not on file  Food Insecurity:   . Worried About Charity fundraiser in the Last Year: Not on file  . Ran Out of Food in the Last Year: Not on file  Transportation Needs:   . Lack of Transportation  (Medical): Not on file  . Lack of Transportation (Non-Medical): Not on file  Physical Activity:   . Days of Exercise per Week: Not on file  . Minutes of Exercise per Session: Not on file  Stress:   . Feeling of Stress : Not on file  Social Connections:   . Frequency of Communication with Friends and Family: Not on file  . Frequency of Social Gatherings with Friends and Family: Not on file  . Attends Religious Services: Not on file  . Active Member of Clubs or Organizations: Not on file  . Attends Archivist Meetings: Not on file  . Marital Status: Not on file  Intimate Partner Violence:   . Fear of Current or Ex-Partner: Not on file  . Emotionally Abused: Not on file  . Physically Abused: Not on file  . Sexually Abused: Not on file    Past Surgical History:  Procedure Laterality Date  . ABDOMINAL HYSTERECTOMY  2000/2009  . APPENDECTOMY  2007  . BOWEL RESECTION  2007  . CARDIAC CATHETERIZATION  2016   Clean  . CARPAL TUNNEL RELEASE Right   . CHOLECYSTECTOMY  2012  . CHOLECYSTECTOMY  2012  . NECK SURGERY  06/11/2016   plates and screws in neck   . SMALL INTESTINE SURGERY  2007   Bowel resection  . TONSILLECTOMY    . VESICO-VAGINAL FISTULA REPAIR  2007    Family History  Problem Relation Age of Onset  . Hypertension Mother   . GER disease Mother   . Polymyalgia rheumatica Mother   . Arthritis Mother   . GER disease Father     Allergies  Allergen Reactions  . Bactrim [Sulfamethoxazole-Trimethoprim] Hives  . Macrobid [Nitrofurantoin Macrocrystal] Hives  . Tizanidine Other (See Comments)    Bad Mood changs  . Vicodin [Hydrocodone-Acetaminophen] Swelling    Puffy eyes and face  . Trileptal [Oxcarbazepine] Rash  . Hydrocodone   . Nexium [Esomeprazole Magnesium]     Face swelling, hives   . Prilosec [Omeprazole]     Face swelling, hives   . Sulfa Antibiotics   . Erythromycin Rash    Current Outpatient Medications on File Prior to Visit  Medication  Sig Dispense Refill  . amLODipine (NORVASC) 5 MG tablet Take 1 tablet by mouth once daily for blood pressure. 90 tablet 3  . amphetamine-dextroamphetamine (ADDERALL) 20 MG tablet TAKE 1 TABLET BY MOUTH 3 TIMES DAILY AFTER MEALS. 90 tablet 0  . BD PEN NEEDLE NANO U/F 32G X 4 MM MISC USE ONE  4 TIMES DAILY WITH  INSULIN  PEN 300 each 1  . cyanocobalamin (,VITAMIN B-12,) 1000 MCG/ML injection INJECT 1 VIAL (1 ML) INTO THE MUSCLE EVERY 3 TO 4 WEEKS  24 mL 1  . EPINEPHrine 0.3 mg/0.3 mL IJ SOAJ injection Inject 0.3 mLs (0.3 mg total) into the muscle as needed for anaphylaxis. 1 each 0  . escitalopram (LEXAPRO) 10 MG tablet Take 1 tablet (10 mg total) by mouth daily. 30 tablet 2  . levothyroxine (SYNTHROID) 112 MCG tablet Take 1 tablet by mouth every morning on an empty stomach. No food or other medications for 30 minutes. 90 tablet 3  . magic mouthwash SOLN Take 15 mLs by mouth 3 (three) times daily as needed for mouth pain (Modified Magic Mouthwash). 360 mL 0  . olmesartan (BENICAR) 20 MG tablet TAKE 1 TABLET BY MOUTH TWICE A DAY 180 tablet 3  . pantoprazole (PROTONIX) 40 MG tablet TAKE 1 TABLET BY MOUTH TWICE (2) DAILY 180 tablet 3  . pregabalin (LYRICA) 75 MG capsule Take 75 mg by mouth daily.     . SYMBICORT 160-4.5 MCG/ACT inhaler INHALE 2 PUFFS INTO THE LUNGS TWICE DAILY 10.2 g 5  . VENTOLIN HFA 108 (90 Base) MCG/ACT inhaler INHALE 2 PUFFS INTO THE LUNGS EVERY 6 HOURS AS NEEDED FOR WHEEZING ORSHORTNESS OF BREATH. 18 g 0   No current facility-administered medications on file prior to visit.    BP 134/70   Pulse 94   Temp (!) 97.1 F (36.2 C) (Temporal)   Ht 5\' 3"  (1.6 m)   Wt 163 lb 8 oz (74.2 kg)   SpO2 100%   BMI 28.96 kg/m    Objective:   Physical Exam  Constitutional: She appears well-nourished.  Cardiovascular: Normal rate and regular rhythm.  Respiratory: Effort normal and breath sounds normal.  Musculoskeletal:     Cervical back: Neck supple.  Skin: Skin is warm and dry.   Psychiatric: She has a normal mood and affect.           Assessment & Plan:

## 2019-06-30 NOTE — Assessment & Plan Note (Signed)
Continued. Recommended she wean off of Lyrica if possible. She will continue to try to wean off.

## 2019-07-01 ENCOUNTER — Other Ambulatory Visit: Payer: Self-pay | Admitting: Primary Care

## 2019-07-01 DIAGNOSIS — E039 Hypothyroidism, unspecified: Secondary | ICD-10-CM

## 2019-07-01 MED ORDER — LEVOTHYROXINE SODIUM 100 MCG PO TABS: ORAL_TABLET | ORAL | 0 refills | Status: DC

## 2019-07-02 ENCOUNTER — Other Ambulatory Visit: Payer: Self-pay

## 2019-07-02 ENCOUNTER — Ambulatory Visit (INDEPENDENT_AMBULATORY_CARE_PROVIDER_SITE_OTHER): Payer: 59 | Admitting: Psychiatry

## 2019-07-02 DIAGNOSIS — F410 Panic disorder [episodic paroxysmal anxiety] without agoraphobia: Secondary | ICD-10-CM | POA: Diagnosis not present

## 2019-07-02 DIAGNOSIS — S069XAS Unspecified intracranial injury with loss of consciousness status unknown, sequela: Secondary | ICD-10-CM

## 2019-07-02 DIAGNOSIS — F0630 Mood disorder due to known physiological condition, unspecified: Secondary | ICD-10-CM

## 2019-07-02 DIAGNOSIS — S069X9S Unspecified intracranial injury with loss of consciousness of unspecified duration, sequela: Secondary | ICD-10-CM

## 2019-07-02 MED ORDER — AMPHETAMINE-DEXTROAMPHETAMINE 20 MG PO TABS: ORAL_TABLET | ORAL | 0 refills | Status: DC

## 2019-07-02 MED ORDER — ESCITALOPRAM OXALATE 10 MG PO TABS: 10.0000 mg | ORAL_TABLET | Freq: Every day | ORAL | 2 refills | Status: DC

## 2019-07-02 MED ORDER — DIAZEPAM 5 MG PO TABS: 5.0000 mg | ORAL_TABLET | Freq: Three times a day (TID) | ORAL | 2 refills | Status: DC | PRN
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
## 2019-07-06 ENCOUNTER — Ambulatory Visit (INDEPENDENT_AMBULATORY_CARE_PROVIDER_SITE_OTHER): Payer: 59 | Admitting: Psychiatry

## 2019-07-06 ENCOUNTER — Encounter (HOSPITAL_COMMUNITY): Payer: Self-pay | Admitting: Psychiatry

## 2019-07-06 ENCOUNTER — Other Ambulatory Visit: Payer: Self-pay

## 2019-07-06 DIAGNOSIS — S069X9S Unspecified intracranial injury with loss of consciousness of unspecified duration, sequela: Secondary | ICD-10-CM | POA: Diagnosis not present

## 2019-07-06 DIAGNOSIS — F0630 Mood disorder due to known physiological condition, unspecified: Secondary | ICD-10-CM

## 2019-07-06 DIAGNOSIS — S069XAS Unspecified intracranial injury with loss of consciousness status unknown, sequela: Secondary | ICD-10-CM

## 2019-07-06 DIAGNOSIS — F064 Anxiety disorder due to known physiological condition: Secondary | ICD-10-CM

## 2019-07-06 NOTE — Progress Notes (Signed)
Virtual Visit via Video Note  I connected with Katie Robbins on 07/06/19 at  1:00 PM EST by a video enabled telemedicine application and verified that I am speaking with the correct person using two identifiers.  Location: Patient: Patient Home Provider: Home Office   I discussed the limitations of evaluation and management by telemedicine and the availability of in person appointments. The patient expressed understanding and agreed to proceed.  History of Present Illness: Mood Disorder related to TBI and Anxiety due to medical conditions  Treatment Plan Goals: [name] will learn coping skills to better manage anxiety and depressive symptoms to increase level of functioning. She would like to improve self-esteem, and accept hernew identity post-TBI.  Observations/Objective: Counselor met with Katie Robbins for individual therapy via Webex. Counselor assessed MH symptoms and progress on treatment plan goals, with patient reporting new findings about physical health diagnosis that explains her increased depression and difficulties with daily functioning. Katie Robbins presents with mild depression and mild anxiety. [name] denied suicidal ideation or self-harm behaviors.   Katie Robbins shared that she recently experienced such pain and impairment in functioning that she sought testing from her medical provider, which resulted in a new diagnosis of Hypothyroidism. She stated that medication changes were made and since her functioning has improved and symptoms have decreased. Counselor processed thoughts and feelings regarding the diagnosis. Counselor shared psychoeducation about the presentation of symptoms in mental health as associated with physical health. Katie Robbins expressed feeling positive about moving forward with treating and better understanding the impacts of her new diagnosis. Counselor and [name] discussed discharge planning, strategies for maintaining mental health and treatment goal progress. Counselor connected  Katie Robbins with her new therapist, Katie Robbins to arrange next meeting.   Assessment and Plan: New Counselor will continue to meet with patient to address treatment plan goals. Patient will continue to follow recommendations of providers and implement skills learned in session.  Follow Up Instructions: New Counselor will send information for next session via Webex.    The patient was advised to call back or seek an in-person evaluation if the symptoms worsen or if the condition fails to improve as anticipated.  I provided 35 minutes of non-face-to-face time during this encounter.   Katie Auer, LCSW

## 2019-07-21 ENCOUNTER — Encounter (HOSPITAL_COMMUNITY): Payer: Self-pay | Admitting: Psychiatry

## 2019-07-21 ENCOUNTER — Other Ambulatory Visit: Payer: Self-pay

## 2019-07-21 ENCOUNTER — Ambulatory Visit (INDEPENDENT_AMBULATORY_CARE_PROVIDER_SITE_OTHER): Payer: 59 | Admitting: Psychiatry

## 2019-07-21 DIAGNOSIS — F0630 Mood disorder due to known physiological condition, unspecified: Secondary | ICD-10-CM | POA: Diagnosis not present

## 2019-07-21 DIAGNOSIS — S069XAS Unspecified intracranial injury with loss of consciousness status unknown, sequela: Secondary | ICD-10-CM

## 2019-07-21 DIAGNOSIS — F064 Anxiety disorder due to known physiological condition: Secondary | ICD-10-CM | POA: Diagnosis not present

## 2019-07-21 DIAGNOSIS — S069X9S Unspecified intracranial injury with loss of consciousness of unspecified duration, sequela: Secondary | ICD-10-CM | POA: Diagnosis not present
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
## 2019-07-29 ENCOUNTER — Other Ambulatory Visit: Payer: Self-pay | Admitting: Primary Care

## 2019-07-29 DIAGNOSIS — J4540 Moderate persistent asthma, uncomplicated: Secondary | ICD-10-CM

## 2019-08-06 ENCOUNTER — Ambulatory Visit (HOSPITAL_COMMUNITY): Payer: 59 | Admitting: Psychiatry

## 2019-08-13 ENCOUNTER — Ambulatory Visit (INDEPENDENT_AMBULATORY_CARE_PROVIDER_SITE_OTHER): Payer: 59 | Admitting: Psychiatry

## 2019-08-13 ENCOUNTER — Other Ambulatory Visit (HOSPITAL_COMMUNITY): Payer: Self-pay | Admitting: Psychiatry

## 2019-08-13 ENCOUNTER — Other Ambulatory Visit: Payer: Self-pay

## 2019-08-13 DIAGNOSIS — F0630 Mood disorder due to known physiological condition, unspecified: Secondary | ICD-10-CM

## 2019-08-13 DIAGNOSIS — S069X9S Unspecified intracranial injury with loss of consciousness of unspecified duration, sequela: Secondary | ICD-10-CM | POA: Diagnosis not present

## 2019-08-13 DIAGNOSIS — S069XAS Unspecified intracranial injury with loss of consciousness status unknown, sequela: Secondary | ICD-10-CM

## 2019-08-13 NOTE — Progress Notes (Signed)
Virtual Visit via Telephone Note  I connected with Katie Robbins on 08/13/19 at 2:15 PM EDT by telephone and verified that I am speaking with the correct person using two identifiers.   I discussed the limitations, risks, security and privacy concerns of performing an evaluation and management service by telephone and the availability of in person appointments. I also discussed with the patient that there may be a patient responsible charge related to this service. The patient expressed understanding and agreed to proceed.   I provided 40 minutes of non-face-to-face time during this encounter.   [name], LCSW    THERAPIST PROGRESS NOTE  Session Time: Thursday 08/13/2019 2:15 PM -  2:55 PM   Participation Level: Active  Behavioral Response: AlertEuthymic/talkative  Type of Therapy: Individual Therapy  Treatment Goals addressed:  Patient will learn coping skills to better manage anxiety and depressivesymptoms to increase level of functioning. She would like to improve self-esteem, and accept her newidentitypost-TBI.  Interventions: CBT and Supportive  Summary: Katie Robbins is a 64 y.o. female who is referred for services for continuity of care while her previous therapist is on medical leave.  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 She reports being impatient, becoming irritated with husband easily, having difficulty understanding people's conversations, pattern of interrupting conversations, being impulsive and saying things she thinks she shouldn't say, and a pattern of avoiding places.   Patient last was seen via virtual visit for assessment appointment about 2 weeks ago.  She presents with mild anxiety and mild depression.  She reports increased involvement in activity and improved mood.  She also reports increased energy since a change in her medication for thyroid disease.  She reports working in the yard 2 days last week.   She also cooked breakfast.  She has been spending more time with a friend and reports going on a walk in the woods as well as going out to dinner.  She is looking forward to going to see family in [place].  She is pleased regarding improved communication with husband.  She reports being more aware of how she may misinterpret information and is making efforts to try to journal her feelings/thoughts.    Suicidal/Homicidal: Nowithout intent/plan  Therapist Response: Established rapport, reviewed symptoms, discussed stressors, facilitated expression of thoughts and feelings, validated feelings, discussed patient's support system, praised and reinforced patient's increased involvement in activity and increased awareness of thoughts and feelings, discussed effects, assisted patient began to examine her pattern of interaction with husband, encouraged patient to continue current efforts as well as use of journaling, began to explore relaxation techniques  Plan: Return again in 2 weeks.  Diagnosis: Axis I: Mood disorder as late effect of TBI, Anxiety Disorder due to general medical condition        [name], LCSW 08/13/2019

## 2019-08-20 ENCOUNTER — Other Ambulatory Visit: Payer: Self-pay | Admitting: Primary Care

## 2019-08-20 DIAGNOSIS — E039 Hypothyroidism, unspecified: Secondary | ICD-10-CM

## 2019-08-21 ENCOUNTER — Telehealth: Payer: Self-pay | Admitting: *Deleted

## 2019-08-21 NOTE — Telephone Encounter (Signed)
Noted  

## 2019-08-21 NOTE — Telephone Encounter (Signed)
Patient called stating that she is flying out today and would like a letter to have regarding her conditions. Patient stated that she has a balance problem and sometimes they do not want her to get on the plane because of her balance. Patient stated that she knows this is a short notice. Patient stated that she would like to pick it up by noon on her way to the airport. Patient stated that her carry-on is heavy and adds to her balance problems.  Called patient back and advised her that Allie Bossier NP is with patients this morning and unable to do a letter until later today. Patient stated that she understood and realized it was a short notice. Patient stated that she is on my chart and will try to print something out to take with her to the airport. Advised patient if she needs something like this in the future to please give Anda Kraft a few days notice and she understood.

## 2019-08-27 ENCOUNTER — Ambulatory Visit (HOSPITAL_COMMUNITY): Payer: 59 | Admitting: Psychiatry

## 2019-09-01 ENCOUNTER — Other Ambulatory Visit (INDEPENDENT_AMBULATORY_CARE_PROVIDER_SITE_OTHER): Payer: 59

## 2019-09-01 ENCOUNTER — Other Ambulatory Visit: Payer: Self-pay

## 2019-09-01 DIAGNOSIS — E039 Hypothyroidism, unspecified: Secondary | ICD-10-CM | POA: Diagnosis not present

## 2019-09-01 LAB — TSH: TSH: 7.05 u[IU]/mL — ABNORMAL HIGH (ref 0.35–4.50)

## 2019-09-02 ENCOUNTER — Emergency Department (HOSPITAL_COMMUNITY): Payer: 59

## 2019-09-02 ENCOUNTER — Encounter (HOSPITAL_COMMUNITY): Payer: Self-pay | Admitting: Emergency Medicine

## 2019-09-02 ENCOUNTER — Telehealth: Payer: Self-pay | Admitting: *Deleted

## 2019-09-02 ENCOUNTER — Emergency Department (HOSPITAL_COMMUNITY)
Admission: EM | Admit: 2019-09-02 | Discharge: 2019-09-02 | Disposition: A | Discharge status: Home / Self Care | Payer: 59 | Attending: Emergency Medicine | Admitting: Emergency Medicine

## 2019-09-02 DIAGNOSIS — I129 Hypertensive chronic kidney disease with stage 1 through stage 4 chronic kidney disease, or unspecified chronic kidney disease: Secondary | ICD-10-CM | POA: Diagnosis not present

## 2019-09-02 DIAGNOSIS — J45909 Unspecified asthma, uncomplicated: Secondary | ICD-10-CM | POA: Insufficient documentation

## 2019-09-02 DIAGNOSIS — Z79899 Other long term (current) drug therapy: Secondary | ICD-10-CM | POA: Insufficient documentation

## 2019-09-02 DIAGNOSIS — R109 Unspecified abdominal pain: Secondary | ICD-10-CM

## 2019-09-02 DIAGNOSIS — E039 Hypothyroidism, unspecified: Secondary | ICD-10-CM | POA: Insufficient documentation

## 2019-09-02 DIAGNOSIS — R1030 Lower abdominal pain, unspecified: Secondary | ICD-10-CM | POA: Diagnosis present

## 2019-09-02 DIAGNOSIS — N183 Chronic kidney disease, stage 3 unspecified: Secondary | ICD-10-CM | POA: Diagnosis not present

## 2019-09-02 DIAGNOSIS — Z9049 Acquired absence of other specified parts of digestive tract: Secondary | ICD-10-CM | POA: Diagnosis not present

## 2019-09-02 LAB — COMPREHENSIVE METABOLIC PANEL
ALT: 9 U/L (ref 0–44)
AST: 15 U/L (ref 15–41)
Albumin: 3.8 g/dL (ref 3.5–5.0)
Alkaline Phosphatase: 105 U/L (ref 38–126)
Anion gap: 11 (ref 5–15)
BUN: 10 mg/dL (ref 8–23)
CO2: 24 mmol/L (ref 22–32)
Calcium: 8.7 mg/dL — ABNORMAL LOW (ref 8.9–10.3)
Chloride: 98 mmol/L (ref 98–111)
Creatinine, Ser: 1.01 mg/dL — ABNORMAL HIGH (ref 0.44–1.00)
GFR calc Af Amer: >60 mL/min (ref >60)
GFR calc non Af Amer: 59 mL/min — ABNORMAL LOW (ref >60)
Glucose, Bld: 133 mg/dL — ABNORMAL HIGH (ref 70–99)
Potassium: 3.9 mmol/L (ref 3.5–5.1)
Sodium: 133 mmol/L — ABNORMAL LOW (ref 135–145)
Total Bilirubin: 0.6 mg/dL (ref 0.3–1.2)
Total Protein: 7.5 g/dL (ref 6.5–8.1)

## 2019-09-02 LAB — CBC
HCT: 39.2 % (ref 36.0–46.0)
Hemoglobin: 12.8 g/dL (ref 12.0–15.0)
MCH: 30.3 pg (ref 26.0–34.0)
MCHC: 32.7 g/dL (ref 30.0–36.0)
MCV: 92.7 fL (ref 80.0–100.0)
Platelets: 217 10*3/uL (ref 150–400)
RBC: 4.23 MIL/uL (ref 3.87–5.11)
RDW: 14.2 % (ref 11.5–15.5)
WBC: 10.1 10*3/uL (ref 4.0–10.5)
nRBC: 0 % (ref 0.0–0.2)

## 2019-09-02 LAB — LIPASE, BLOOD: Lipase: 19 U/L (ref 11–51)

## 2019-09-02 LAB — URINALYSIS, ROUTINE W REFLEX MICROSCOPIC
Bacteria, UA: NONE SEEN
Bilirubin Urine: NEGATIVE
Glucose, UA: NEGATIVE mg/dL
Ketones, ur: 5 mg/dL — AB
Leukocytes,Ua: NEGATIVE
Nitrite: NEGATIVE
Protein, ur: NEGATIVE mg/dL
Specific Gravity, Urine: 1.021 (ref 1.005–1.030)
pH: 5 (ref 5.0–8.0)

## 2019-09-02 LAB — WET PREP, GENITAL
Clue Cells Wet Prep HPF POC: NONE SEEN
Sperm: NONE SEEN
Trich, Wet Prep: NONE SEEN
Yeast Wet Prep HPF POC: NONE SEEN

## 2019-09-02 MED ORDER — OXYCODONE-ACETAMINOPHEN 5-325 MG PO TABS: 1.0000 | ORAL_TABLET | Freq: Four times a day (QID) | ORAL | 0 refills | Status: DC | PRN

## 2019-09-02 MED ORDER — MORPHINE SULFATE (PF) 4 MG/ML IV SOLN
4.0000 mg | Freq: Once | INTRAVENOUS | Status: AC
  Administered 2019-09-02: 4 mg via INTRAVENOUS
  Filled 2019-09-02: qty 1

## 2019-09-02 MED ORDER — SODIUM CHLORIDE 0.9% FLUSH: 3.0000 mL | Freq: Once | INTRAVENOUS | Status: DC

## 2019-09-02 MED ORDER — PHENAZOPYRIDINE HCL 200 MG PO TABS: 200.0000 mg | ORAL_TABLET | Freq: Three times a day (TID) | ORAL | 0 refills | Status: DC

## 2019-09-02 MED ORDER — IOHEXOL 300 MG/ML  SOLN
100.0000 mL | Freq: Once | INTRAMUSCULAR | Status: AC | PRN
  Administered 2019-09-02: 100 mL via INTRAVENOUS

## 2019-09-02 MED ORDER — ONDANSETRON HCL 4 MG/2ML IJ SOLN
4.0000 mg | Freq: Once | INTRAMUSCULAR | Status: AC
  Administered 2019-09-02: 4 mg via INTRAVENOUS
  Filled 2019-09-02: qty 2

## 2019-09-02 MED ORDER — SODIUM CHLORIDE 0.9 % IV BOLUS
1000.0000 mL | Freq: Once | INTRAVENOUS | Status: AC
  Administered 2019-09-02: 1000 mL via INTRAVENOUS

## 2019-09-02 NOTE — ED Provider Notes (Signed)
Patient is a 64 year old female who was transferred to me at shift change from [employer].  For more detailed information please see prior PA-C's note.  Patient presented today with 2 days of suprapubic pain.  She has had a benign work-up.  Negative CT of the abdomen.  She does have a history of interstitial cystitis and was concerned this could be the cause of her symptoms.  Wet prep was performed and was pending at shift change.  Physical Exam  BP (!) 145/73   Pulse 81   Temp 99 F (37.2 C) (Oral)   Resp 18   Ht 5\' 3"  (1.6 m)   Wt 70.3 kg   SpO2 98%   BMI 27.46 kg/m   Physical Exam Vitals and nursing note reviewed.  Constitutional:      General: She is not in acute distress.    Appearance: She is well-developed and normal weight. She is not ill-appearing, toxic-appearing or diaphoretic.  HENT:     Head: Normocephalic and atraumatic.  Eyes:     Pupils: Pupils are equal, round, and reactive to light.  Cardiovascular:     Rate and Rhythm: Normal rate.  Pulmonary:     Effort: Pulmonary effort is normal.  Abdominal:     General: Abdomen is flat.     Tenderness: There is abdominal tenderness in the suprapubic area.  Skin:    General: Skin is warm and dry.     Capillary Refill: Capillary refill takes less than 2 seconds.  Neurological:     General: No focal deficit present.     Mental Status: She is alert and oriented to person, place, and time.  Psychiatric:        Mood and Affect: Mood normal.        Behavior: Behavior normal.     ED Course/Procedures   Procedures  MDM  Patient is a pleasant 64 year old female that was given to me at shift change from [facility].  She had a benign work-up including basic labs as well as a CT of her abdomen and pelvis.  She has a history of interstitial cystitis and was concerned that her suprapubic pain was due to this.  She had a pending wet prep which has since resulted and is negative.  I discussed this with patient.  Her  questions were answered and she was amicable with being discharged at this time.  She understands to follow-up with her primary care provider regarding this visit.  I additionally recommended she could follow-up with her urologist to discuss this visit.  Vital signs stable.       Rayna Sexton, PA-C 09/02/19 1713    Carmin Muskrat, MD 09/02/19 [phone]

## 2019-09-02 NOTE — Telephone Encounter (Signed)
Patient's husband called stating that his wife is doubled over in bed with lower abdominal pain. Patient's husband stated that her pain level is about a 9-10. Mr. Blanch Media stated that his wife has a history of diverticulitis. Patient's husband stated that his wife had diarrhea about 6 times during the night. Mr. Bloyer stated that his wife did an AZO test yesterday and it showed leukocytes and her temperature is 98.9. Patient's husband stated that they did take their second Moderna vaccine yesterday, but she was having some abdominal pain prior to the vaccine, but the diarrhea did start last night. After speaking to Allie Bossier NP patient's husband was advised that she does need to be evaluated and should take her to the ER. Advised Mr. Aloia that patient may need some scans done. Patient's husband stated that he will take her to [facility] ER now. Advised Mr. Slominski to let his wife know that Anda Kraft had sent her a my chart message regarding her thyroid test and will wait on her response.

## 2019-09-02 NOTE — Telephone Encounter (Signed)
Noted  

## 2019-09-02 NOTE — Discharge Instructions (Addendum)
You have been seen today for abdominal pain. Please read and follow all provided instructions. Return to the emergency room for worsening condition or new concerning symptoms.    Your work-up today was normal.  All your blood work was within normal limits.  Your CT scan did not show any abnormalities or signs of infection in your abdomen or pelvis.  1. Medications:  -Prescription sent to your pharmacy for percocet. This is a narcotic pain medicine and should be taken for severe pain only.  Do not drive or work when taking as it can make you drowsy.  This medication has Tylenol in it so do not take any additional Tylenol at the same time.  The maximum amount of Tylenol in 1 day is 4 g. -Prescription also sent to pharmacy for Pyridium.  This is used to treat bladder spasms.  Please take as prescribed.  Continue usual home medications Take medications as prescribed. Please review all of the medicines and only take them if you do not have an allergy to them.   2. Treatment: rest, drink plenty of fluids  3. Follow Up:  Please follow up with primary care provider by scheduling an appointment as soon as possible for a visit     It is also a possibility that you have an allergic reaction to any of the medicines that you have been prescribed - Everybody reacts differently to medications and while MOST people have no trouble with most medicines, you may have a reaction such as nausea, vomiting, rash, swelling, shortness of breath. If this is the case, please stop taking the medicine immediately and contact your physician.  ?

## 2019-09-02 NOTE — ED Provider Notes (Cosign Needed)
[facility]ERGENCY DEPARTMENT Provider Note   CSN: [id number] Arrival date & time: 09/02/19  1023     History Chief Complaint  Patient presents with  . Abdominal Pain    Katie Robbins is a 64 y.o. female with past medical history significant for anxiety, arthritis, diverticulitis, hypertension, DM Barr syndrome, hypertension, interstitial cystitis, bowel resection presents to emergency department today with chief complaint of progressively worsening abdominal pain x2 days.  Patient states she had intercourse x3 days ago without dyspareunia.  The next morning when she woke up she had severe suprapubic abdominal pain.  The pain radiates to lower quadrants of her abdomen.  She states the pain has been constant.  She describes it as a throbbing and sharp sensation.  She rates the pain 8 out of 10 in severity.  She has not tried taking anything for her pain prior to arrival.  She does also endorse associated nausea without emesis.  She did have 5 episodes of diarrhea, did not notice any blood in her stool.  She denies any fever, chills, chest pain, shortness of breath, back pain, flank pain, dysuria, gross hematuria, urinary frequency, pelvic pain, vaginal discharge, abnormal vaginal bleeding, rash.  She states she saw her GYN for annual visit on 07/27/2019.  She had negative STD testing at that visit.  Abdominal surgical history includes bowel resection, appendectomy, cholecystectomy, hysterectomy  History provided by patient and spouse with additional history obtained from chart review.     Past Medical History:  Diagnosis Date  . Anxiety   . Arthritis   . Asthma   . Chicken pox   . Chronic headaches   . Diverticulitis   . Essential hypertension   . GERD (gastroesophageal reflux disease)   . Guillain Barr syndrome ([place]) 1982  . H/O breast implant   . Hypertension   . Hypothyroidism   . Interstitial cystitis   . Interstitial cystitis   . Mitral prolapse 1985  .  Nutcracker esophagus     Patient Active Problem List   Diagnosis Date Noted  . CKD (chronic kidney disease) stage 3, GFR 30-59 ml/min 06/30/2019  . Dysuria 03/16/2019  . Vitamin B 12 deficiency 01/27/2019  . Panic disorder 12/29/2018  . Rash 10/29/2018  . Mood disorder as late effect of traumatic brain injury ([place]) 08/07/2018  . 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 Prediabetes 07/08/2018  . Numbness and tingling 07/08/2018  . Environmental and seasonal allergies 04/24/2018  . Dyspnea 02/06/2018  . Chronic fatigue 01/23/2018  . Primary osteoarthritis of left knee 05/29/2017  . S/P carpal tunnel release 05/29/2017  . Primary osteoarthritis of both hands 05/29/2017  . History of bilateral breast implants 05/29/2017  . Interstitial cystitis 05/29/2017  . History of mitral valve prolapse 05/29/2017  . History of diverticulitis 05/29/2017  . Cervical disc disorder with radiculopathy of cervical region 04/12/2016  . Preventative health care 11/28/2015  . Esophageal reflux 08/19/2015  . Essential hypertension 08/19/2015  . Hypothyroidism 08/19/2015  . Asthma 08/19/2015  . Chronic headaches 08/19/2015  . Estrogen deficiency 08/19/2015    Past Surgical History:  Procedure Laterality Date  . ABDOMINAL HYSTERECTOMY  2000/2009  . APPENDECTOMY  2007  . BOWEL RESECTION  2007  . CARDIAC CATHETERIZATION  2016   Clean  . CARPAL TUNNEL RELEASE Right   . CHOLECYSTECTOMY  2012  . CHOLECYSTECTOMY  2012  . NECK SURGERY  06/11/2016   plates and screws in neck   .  SMALL INTESTINE SURGERY  2007   Bowel resection  . TONSILLECTOMY    . [place] FISTULA REPAIR  2007     OB History   No obstetric history on file.     Family History  Problem Relation Age of Onset  . Hypertension Mother   . GER disease Mother   . Polymyalgia rheumatica Mother   . Arthritis Mother   . GER disease Father     Social History   Tobacco Use  . Smoking  status: Never Smoker  . Smokeless tobacco: Never Used  Substance Use Topics  . Alcohol use: Yes    Comment: socially  . Drug use: No    Home Medications Prior to Admission medications   Medication Sig Start Date End Date Taking? Authorizing Provider  amLODipine (NORVASC) 5 MG tablet Take 1 tablet by mouth once daily for blood pressure. 12/05/18  Yes Pleas Koch, NP  amphetamine-dextroamphetamine (ADDERALL) 20 MG tablet TAKE 1 TABLET BY MOUTH 3 TIMES DAILY AFTER MEALS Patient taking differently: Take 20 mg by mouth 3 (three) times daily after meals.  08/14/19  Yes Pucilowski, Olgierd A, MD  cyanocobalamin (,VITAMIN B-12,) 1000 MCG/ML injection INJECT 1 VIAL (1 ML) INTO THE MUSCLE EVERY 3 TO 4 WEEKS Patient taking differently: Inject 1,000 mcg into the muscle See admin instructions. Every 3 to 4 weeks 06/05/19  Yes Pleas Koch, NP  diazepam (VALIUM) 5 MG tablet Take 1 tablet (5 mg total) by mouth 3 (three) times daily as needed for anxiety. 07/02/19 09/30/19 Yes Pucilowski, Olgierd A, MD  EPINEPHrine 0.3 mg/0.3 mL IJ SOAJ injection Inject 0.3 mLs (0.3 mg total) into the muscle as needed for anaphylaxis. 02/11/19  Yes Pleas Koch, NP  escitalopram (LEXAPRO) 10 MG tablet Take 1 tablet (10 mg total) by mouth daily. 07/02/19 09/30/19 Yes Pucilowski, Olgierd A, MD  estradiol (ESTRACE) 1 MG tablet Take 1 mg by mouth daily.   Yes [provider]  levothyroxine (SYNTHROID) 100 MCG tablet Take 1 tablet by mouth every morning on an empty stomach with water only.  No food or other medications for 30 minutes. 07/01/19  Yes Pleas Koch, NP  magic mouthwash SOLN Take 15 mLs by mouth 3 (three) times daily as needed for mouth pain (Modified Magic Mouthwash). 01/08/18  Yes Pleas Koch, NP  olmesartan (BENICAR) 20 MG tablet TAKE 1 TABLET BY MOUTH TWICE A DAY Patient taking differently: Take 20 mg by mouth in the morning and at bedtime.  02/27/19  Yes Pleas Koch, NP   pantoprazole (PROTONIX) 40 MG tablet TAKE 1 TABLET BY MOUTH TWICE (2) DAILY Patient taking differently: Take 40 mg by mouth 2 (two) times daily.  12/05/18  Yes Pleas Koch, NP  SYMBICORT 160-4.5 MCG/ACT inhaler INHALE 2 PUFFS INTO THE LUNGS TWICE DAILY Patient taking differently: Inhale 2 puffs into the lungs in the morning and at bedtime.  02/27/19  Yes Pleas Koch, NP  VENTOLIN HFA 108 (90 Base) MCG/ACT inhaler INHALE 2 PUFFS INTO THE LUNGS EVERY 6 HOURS AS NEEDED FOR WHEEZING OR SHORTNESS OF BREATH. Patient taking differently: Inhale 2 puffs into the lungs every 6 (six) hours as needed for wheezing or shortness of breath.  07/30/19  Yes Pleas Koch, NP  BD PEN NEEDLE NANO U/F 32G X 4 MM MISC USE ONE  4 TIMES DAILY WITH  INSULIN  PEN 04/08/17   Pleas Koch, NP  oxyCODONE-acetaminophen (PERCOCET/ROXICET) 5-325 MG tablet Take 1 tablet  by mouth every 6 (six) hours as needed for severe pain. 09/02/19   [place] Anagnos E, PA-C  phenazopyridine (PYRIDIUM) 200 MG tablet Take 1 tablet (200 mg total) by mouth 3 (three) times daily. 09/02/19   Naryiah Schley E, PA-C    Allergies    Bactrim [sulfamethoxazole-trimethoprim], Macrobid [nitrofurantoin macrocrystal], Tizanidine, Vicodin [hydrocodone-acetaminophen], Trileptal [oxcarbazepine], Hydrocodone, Nexium [esomeprazole magnesium], Prilosec [omeprazole], Sulfa antibiotics, Wasp venom, and Erythromycin  Review of Systems   Review of Systems  All other systems are reviewed and are negative for acute change except as noted in the HPI.   Physical Exam Updated Vital Signs BP (!) 160/88 (BP Location: Left Arm)   Pulse 91   Temp 99 F (37.2 C) (Oral)   Resp 17   Ht 5\' 3"  (1.6 m)   Wt 70.3 kg   SpO2 100%   BMI 27.46 kg/m   Physical Exam Vitals and nursing note reviewed.  Constitutional:      General: She is not in acute distress.    Appearance: She is not ill-appearing.  HENT:     Head: Normocephalic and  atraumatic.     Right Ear: Tympanic membrane and external ear normal.     Left Ear: Tympanic membrane and external ear normal.     Nose: Nose normal.     Mouth/Throat:     Mouth: Mucous membranes are moist.     Pharynx: Oropharynx is clear.  Eyes:     General: No scleral icterus.       Right eye: No discharge.        Left eye: No discharge.     Extraocular Movements: Extraocular movements intact.     Conjunctiva/sclera: Conjunctivae normal.     Pupils: Pupils are equal, round, and reactive to light.  Neck:     Vascular: No JVD.  Cardiovascular:     Rate and Rhythm: Normal rate and regular rhythm.     Pulses: Normal pulses.          Radial pulses are 2+ on the right side and 2+ on the left side.     Heart sounds: Normal heart sounds.  Pulmonary:     Comments: Lungs clear to auscultation in all fields. Symmetric chest rise. No wheezing, rales, or rhonchi. Abdominal:     Tenderness: There is no right CVA tenderness or left CVA tenderness.     Comments: Abdomen is soft, non-distended, exquisite tenderness to suprapubic region. No rigidity, no guarding. No peritoneal signs.  Normoactive bowel sounds.  Genitourinary:    Comments: Normal external genitalia. No pain with speculum insertion. No rash or lesions.  Normal amount of thin white discharge seen in vaginal vault. On bimanual examination no adnexal tenderness or cervical motion tenderness. Chaperone Psychologist, clinical present during exam.  Musculoskeletal:        General: Normal range of motion.     Cervical back: Normal range of motion.  Skin:    General: Skin is warm and dry.     Capillary Refill: Capillary refill takes less than 2 seconds.  Neurological:     Mental Status: She is oriented to person, place, and time.     GCS: GCS eye subscore is 4. GCS verbal subscore is 5. GCS motor subscore is 6.     Comments: Fluent speech, no facial droop.  Psychiatric:        Behavior: Behavior normal.      ED Results / Procedures /  Treatments   Labs (all labs ordered are  listed, but only abnormal results are displayed) Labs Reviewed  COMPREHENSIVE METABOLIC PANEL - Abnormal; Notable for the following components:      Result Value   Sodium 133 (*)    Glucose, Bld 133 (*)    Creatinine, Ser 1.01 (*)    Calcium 8.7 (*)    GFR calc non Af Amer 59 (*)    All other components within normal limits  URINALYSIS, ROUTINE W REFLEX MICROSCOPIC - Abnormal; Notable for the following components:   APPearance HAZY (*)    Hgb urine dipstick SMALL (*)    Ketones, ur 5 (*)    All other components within normal limits  URINE CULTURE  WET PREP, GENITAL  LIPASE, BLOOD  CBC  GC/CHLAMYDIA PROBE AMP ([place]) NOT AT [facility]    EKG None  Radiology CT ABDOMEN PELVIS W CONTRAST  Result Date: 09/02/2019 CLINICAL DATA:  Pt reports lower abd pain that began yesterday morning, states that she had intercourse Monday night and upon waking yesterday the pain was so bad she could hardly walk. Denies bleeding or discharge. Hx of interstitial cystitis EXAM: CT ABDOMEN AND PELVIS WITH CONTRAST TECHNIQUE: Multidetector CT imaging of the abdomen and pelvis was performed using the standard protocol following bolus administration of intravenous contrast. CONTRAST:  150mL OMNIPAQUE IOHEXOL 300 MG/ML  SOLN COMPARISON:  None. FINDINGS: Lower chest: Clear lung bases.  Heart normal in size. Hepatobiliary: Normal liver. Status post cholecystectomy. Intra and extrahepatic bile duct dilation, common bile duct measuring a maximum of 11 mm, mildly increased compared to the prior CT. Pancreas: Mild dilation of the pancreatic duct 3 mm increased from the prior exam. No pancreatic mass or inflammation. Spleen: Normal in size without focal abnormality. Adrenals/Urinary Tract: No adrenal masses. Kidneys normal in position. There is symmetric renal enhancement and excretion. No masses or stones. No hydronephrosis. Ureters are normal in course and in caliber. Bladder is  mildly distended. Bladder is unremarkable Stomach/Bowel: Normal stomach. Small bowel is normal in caliber. No wall thickening or inflammation. Colon is normal in caliber with no wall thickening or inflammation. Low sigmoid colon bowel anastomosis staple line. There are few left colon diverticula. Normal appendix visualized. Vascular/Lymphatic: No significant vascular findings are present. No enlarged abdominal or pelvic lymph nodes. Reproductive: Status post hysterectomy. No adnexal masses. Other: No abdominal wall hernia or abnormality. No abdominopelvic ascites. Musculoskeletal: No fracture or acute finding. No osteoblastic or osteolytic lesions. IMPRESSION: 1. No acute findings within the abdomen or pelvis. No findings to account for lower abdominal pain. 2. Intra and extrahepatic bile duct dilation with mild dilation of the pancreatic duct, without dilation increased from the prior exam, but presumed chronic in this post cholecystectomy patient. Electronically Signed   By: Lajean Manes M.D.   On: 09/02/2019 14:56    Procedures Procedures (including critical care time)  Medications Ordered in ED Medications  sodium chloride flush (NS) 0.9 % injection 3 mL (3 mLs Intravenous Not Given 09/02/19 1117)  morphine 4 MG/ML injection 4 mg (4 mg Intravenous Given 09/02/19 1251)  ondansetron (ZOFRAN) injection 4 mg (4 mg Intravenous Given 09/02/19 1251)  sodium chloride 0.9 % bolus 1,000 mL (1,000 mLs Intravenous New Bag/Given 09/02/19 1349)  iohexol (OMNIPAQUE) 300 MG/ML solution 100 mL (100 mLs Intravenous Contrast Given 09/02/19 1404)    ED Course  I have reviewed the triage vital signs and the nursing notes.  Pertinent labs & imaging results that were available during my care of the patient were reviewed by me  and considered in my medical decision making (see chart for details).    MDM Rules/Calculators/A&P                      Patient presents to the ED with complaints of abdominal pain. Patient  nontoxic appearing, in no apparent distress, vitals WNL. On exam patient tender to suprapubic pain, no peritoneal signs. Will evaluate with labs and CT A/P. Analgesics, anti-emetics, and fluids administered.   Labs reviewed and grossly unremarkable. No leukocytosis, no anemia, no significant electrolyte derangements. LFTs, renal function, and lipase WNL. Urinalysis without obvious infection. CT A/P without any findings to explain patient's pain.  It as noted that her common bile duct is dilated however this is in the setting of a cholecystectomy in 2012.  On repeat abdominal exam patient remains without peritoneal signs, doubt cholecystitis, pancreatitis, diverticulitis, bowel obstruction/perforation even reassuring work-up and CT scan.  Pelvic exam was performed with chaperone present.  There was minimal amount of thin white discharge in the vaginal vault.  No cervical motion tenderness, no adnexal tenderness.  And has been discussed with ED attending Dr. Laverta Baltimore who agrees with plan of care.  Patient care transferred to L. Joldersma PA-C at the end of my shift pending wet prep. If wet prep is negative patient can be discharged with short course of percocet for pain and Pyridium for bladder spasms.  I have already prescribe these medications sent into her pharmacy. I have reviewed the PDMP during this encounter.  Has no recent narcotic prescriptions.  Patient does not wish to have prophylactic STD treatment as she had recent STD testing that was negative at GYN last month.  She is aware she will be contacted if the results are positive and she will need to let her partner know.  She plans to follow-up with PCP for further evaluation of her pain. Patient presentation, ED course, and plan of care discussed with review of all pertinent labs and imaging. Please see his note for further details regarding further ED course and disposition.    Portions of this note were generated with [employer].  Dictation errors may occur despite best attempts at proofreading.  Final Clinical Impression(s) / ED Diagnoses Final diagnoses:  Abdominal pain, unspecified abdominal location    Rx / DC Orders ED Discharge Orders         Ordered    phenazopyridine (PYRIDIUM) 200 MG tablet  3 times daily     09/02/19 1606    oxyCODONE-acetaminophen (PERCOCET/ROXICET) 5-325 MG tablet  Every 6 hours PRN     09/02/19 1606           Arisha Gervais, [place] E, PA-C 09/02/19 1612

## 2019-09-02 NOTE — ED Triage Notes (Signed)
Pt reports lower abd pain that began yesterday morning, states that she had intercourse Monday night and upon waking yesterday the pain was so bad she could hardly walk. Denies bleeding or discharge. Hx of interstitial cystitis. Denies painful urination or frequency.

## 2019-09-03 ENCOUNTER — Other Ambulatory Visit: Payer: Self-pay | Admitting: Primary Care

## 2019-09-03 DIAGNOSIS — E039 Hypothyroidism, unspecified: Secondary | ICD-10-CM

## 2019-09-03 LAB — GC/CHLAMYDIA PROBE AMP (~~LOC~~) NOT AT ARMC
Chlamydia: NEGATIVE
Comment: NEGATIVE
Comment: NORMAL
Neisseria Gonorrhea: NEGATIVE

## 2019-09-03 LAB — URINE CULTURE: Culture: <10000 — AB

## 2019-09-04 NOTE — Telephone Encounter (Signed)
Last prescribed on 07/01/2019 . Last appointment on 06/30/2019. Next future appointment on 09/10/2019

## 2019-09-07 ENCOUNTER — Other Ambulatory Visit: Payer: Self-pay

## 2019-09-07 ENCOUNTER — Telehealth (INDEPENDENT_AMBULATORY_CARE_PROVIDER_SITE_OTHER): Payer: 59 | Admitting: Psychiatry

## 2019-09-07 DIAGNOSIS — F410 Panic disorder [episodic paroxysmal anxiety] without agoraphobia: Secondary | ICD-10-CM | POA: Diagnosis not present

## 2019-09-07 DIAGNOSIS — F0630 Mood disorder due to known physiological condition, unspecified: Secondary | ICD-10-CM | POA: Diagnosis not present

## 2019-09-07 DIAGNOSIS — S069XAS Unspecified intracranial injury with loss of consciousness status unknown, sequela: Secondary | ICD-10-CM

## 2019-09-07 DIAGNOSIS — S069X9S Unspecified intracranial injury with loss of consciousness of unspecified duration, sequela: Secondary | ICD-10-CM

## 2019-09-07 MED ORDER — AMPHETAMINE-DEXTROAMPHETAMINE 20 MG PO TABS: 20.0000 mg | ORAL_TABLET | Freq: Three times a day (TID) | ORAL | 0 refills | Status: DC

## 2019-09-07 MED ORDER — ESCITALOPRAM OXALATE 20 MG PO TABS: 20.0000 mg | ORAL_TABLET | Freq: Every day | ORAL | 2 refills | Status: DC

## 2019-09-07 MED ORDER — DIAZEPAM 5 MG PO TABS: 5.0000 mg | ORAL_TABLET | Freq: Three times a day (TID) | ORAL | 2 refills | Status: DC | PRN
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
## 2019-09-10 ENCOUNTER — Ambulatory Visit: Payer: 59 | Admitting: Primary Care

## 2019-09-10 ENCOUNTER — Other Ambulatory Visit: Payer: Self-pay

## 2019-09-10 ENCOUNTER — Encounter: Payer: Self-pay | Admitting: Primary Care

## 2019-09-10 VITALS — BP 130/72 | HR 90 | Temp 97.0°F | Ht 63.0 in | Wt 162.0 lb

## 2019-09-10 DIAGNOSIS — R1030 Lower abdominal pain, unspecified: Secondary | ICD-10-CM | POA: Diagnosis not present

## 2019-09-10 DIAGNOSIS — E039 Hypothyroidism, unspecified: Secondary | ICD-10-CM

## 2019-09-10 LAB — T4, FREE: Free T4: 0.97 ng/dL (ref 0.60–1.60)

## 2019-09-10 LAB — TSH: TSH: 3.97 u[IU]/mL (ref 0.35–4.50)

## 2019-09-10 NOTE — Assessment & Plan Note (Addendum)
Recent TSH of 7 on levothyroxine 100 mcg. She is taking her levothyroxine correctly. Will start by repeating TSH today, add Free T4 to ensure we didn't get a falsely his reading.  112 mcg is too much with reduction in TSH to 0.04.   Continue levothyroxine 100 mcg.

## 2019-09-10 NOTE — Patient Instructions (Signed)
Stop by the lab prior to leaving today. I will notify you of your results once received.   Be sure to take your levothyroxine (thyroid medication) every morning on an empty stomach with water only. No food or other medications for 30 minutes. No heartburn medication, iron pills, calcium, vitamin D, or magnesium pills within four hours of taking levothyroxine.   It was a pleasure to see you today!  

## 2019-09-10 NOTE — Progress Notes (Signed)
Subjective:    Patient ID: Katie Robbins, female    DOB: Jan 02, 1956, 64 y.o.   MRN: [id number]  HPI  This visit occurred during the SARS-CoV-2 public health emergency.  Safety protocols were in place, including screening questions prior to the visit, additional usage of staff PPE, and extensive cleaning of exam room while observing appropriate contact time as indicated for disinfecting solutions.   Katie Robbins is a 64 year old female with a history of TBI, hypothyroidism, ADHD, asthma, mild neurocognitive disorder, osteoarthritis, interstitial cystitis, chronic headaches, CKD who presents today for ED follow up and follow up of hypothyroidism.   She presented to [facility] on 09/02/19 with a chief complaint of abdominal pain that had progressed over the last 2 days. She had intercourse three days prior without pain. Also with nausea without vomiting, and diarrhea.   During her stay in the ED she underwent CT abdomen/pelvis which did not show acute process. She underwent pelvic exam with mild vaginal discharge. Wet prep negative so she was discharged home later that day.   Since her ED visit she's feeling better. Her abdominal pain has reduced, she denies nausea, diarrhea.   She is taking her levothyroxine every morning on an empty stomach with water only. No food or other mediations for 1 hour. No PPI or vitamins for four hours or later. Recent TSH of 7. She has been on levothyroxine 100 mcg for years, was on 112 mcg for a short period of time with a decrease in TSH to 0.04. she has never been on two different doses of levothyroxine.   She continues to feel fatigued, also with new daily headaches for the last three months. She was completing dry needling prior to Covid-19 pandemic which was very helpful for headaches. She plans on scheduling another appointment.   BP Readings from Last 3 Encounters:  09/10/19 130/72  09/02/19 138/82  06/30/19 134/70   Wt Readings from Last 3 Encounters:    09/10/19 162 lb (73.5 kg)  09/02/19 155 lb (70.3 kg)  06/30/19 163 lb 8 oz (74.2 kg)     Review of Systems  Constitutional: Negative for fever.  Respiratory: Negative for shortness of breath.   Gastrointestinal: Negative for abdominal pain, diarrhea and nausea.       Past Medical History:  Diagnosis Date  . Anxiety   . Arthritis   . Asthma   . Chicken pox   . Chronic headaches   . Diverticulitis   . Essential hypertension   . GERD (gastroesophageal reflux disease)   . Guillain Barr syndrome ([place]) 1982  . H/O breast implant   . Hypertension   . Hypothyroidism   . Interstitial cystitis   . Interstitial cystitis   . Mitral prolapse 1985  . Nutcracker esophagus      Social History   Socioeconomic History  . Marital status: Married    Spouse name: [name]  . Number of children: 2  . Years of education: Some college  . Highest education level: Not on file  Occupational History  . Occupation: Retired  Tobacco Use  . Smoking status: Never Smoker  . Smokeless tobacco: Never Used  Substance and Sexual Activity  . Alcohol use: Yes    Comment: socially  . Drug use: No  . Sexual activity: Yes    Birth control/protection: Surgical  Other Topics Concern  . Not on file  Social History Narrative   Lives with husband   Caffeine use: rare   Right handed  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     Social Determinants of Health   Financial Resource Strain:   . Difficulty of Paying Living Expenses:   Food Insecurity:   . Worried About Charity fundraiser in the Last Year:   . Arboriculturist in the Last Year:   Transportation Needs:   . Film/video editor (Medical):   Marland Kitchen Lack of Transportation (Non-Medical):   Physical Activity:   . Days of Exercise per Week:   . Minutes of Exercise per Session:   Stress:   . Feeling of Stress :   Social Connections:   . Frequency of Communication with Friends and Family:   . Frequency of Social Gatherings with Friends and Family:   . Attends  Religious Services:   . Active Member of Clubs or Organizations:   . Attends Archivist Meetings:   Marland Kitchen Marital Status:   Intimate Partner Violence:   . Fear of Current or Ex-Partner:   . Emotionally Abused:   Marland Kitchen Physically Abused:   . Sexually Abused:     Past Surgical History:  Procedure Laterality Date  . ABDOMINAL HYSTERECTOMY  2000/2009  . APPENDECTOMY  2007  . BOWEL RESECTION  2007  . CARDIAC CATHETERIZATION  2016   Clean  . CARPAL TUNNEL RELEASE Right   . CHOLECYSTECTOMY  2012  . CHOLECYSTECTOMY  2012  . NECK SURGERY  06/11/2016   plates and screws in neck   . SMALL INTESTINE SURGERY  2007   Bowel resection  . TONSILLECTOMY    . VESICO-VAGINAL FISTULA REPAIR  2007    Family History  Problem Relation Age of Onset  . Hypertension Mother   . GER disease Mother   . Polymyalgia rheumatica Mother   . Arthritis Mother   . GER disease Father     Allergies  Allergen Reactions  . Bactrim [Sulfamethoxazole-Trimethoprim] Hives  . Macrobid [Nitrofurantoin Macrocrystal] Hives  . Tizanidine Other (See Comments)    Bad Mood changs  . Vicodin [Hydrocodone-Acetaminophen] Swelling    Puffy eyes and face  . Trileptal [Oxcarbazepine] Rash  . Hydrocodone   . Nexium [Esomeprazole Magnesium]     Face swelling, hives   . Prilosec [Omeprazole]     Face swelling, hives   . Sulfa Antibiotics   . Wasp Venom Hives    swelling  . Erythromycin Rash    Current Outpatient Medications on File Prior to Visit  Medication Sig Dispense Refill  . amLODipine (NORVASC) 5 MG tablet Take 1 tablet by mouth once daily for blood pressure. 90 tablet 3  . [START ON 09/14/2019] amphetamine-dextroamphetamine (ADDERALL) 20 MG tablet Take 1 tablet (20 mg total) by mouth 3 (three) times daily after meals. 90 tablet 0  . [START ON 10/15/2019] amphetamine-dextroamphetamine (ADDERALL) 20 MG tablet Take 1 tablet (20 mg total) by mouth in the morning, at noon, and at bedtime. 90 tablet 0  . [START ON  11/14/2019] amphetamine-dextroamphetamine (ADDERALL) 20 MG tablet Take 1 tablet (20 mg total) by mouth in the morning, at noon, and at bedtime. 90 tablet 0  . BD PEN NEEDLE NANO U/F 32G X 4 MM MISC USE ONE  4 TIMES DAILY WITH  INSULIN  PEN 300 each 1  . cyanocobalamin (,VITAMIN B-12,) 1000 MCG/ML injection INJECT 1 VIAL (1 ML) INTO THE MUSCLE EVERY 3 TO 4 WEEKS (Patient taking differently: Inject 1,000 mcg into the muscle See admin instructions. Every 3 to 4 weeks) 24 mL 1  . diazepam (VALIUM) 5 MG  tablet Take 1 tablet (5 mg total) by mouth 3 (three) times daily as needed for anxiety. 90 tablet 2  . EPINEPHrine 0.3 mg/0.3 mL IJ SOAJ injection Inject 0.3 mLs (0.3 mg total) into the muscle as needed for anaphylaxis. 1 each 0  . escitalopram (LEXAPRO) 20 MG tablet Take 1 tablet (20 mg total) by mouth daily. (Patient taking differently: Take 40 mg by mouth daily. ) 30 tablet 2  . estradiol (ESTRACE) 1 MG tablet Take 1 mg by mouth daily.    Marland Kitchen levothyroxine (SYNTHROID) 100 MCG tablet TAKE 1 TABLET BY MOUTH ONCE A DAY. TAKE ON AN EMPTY STOMACH WITH A GLASS OF WATER ATLEAST 30-60 MIN BEFORE BREAKFAST 90 tablet 0  . magic mouthwash SOLN Take 15 mLs by mouth 3 (three) times daily as needed for mouth pain (Modified Magic Mouthwash). 360 mL 0  . olmesartan (BENICAR) 20 MG tablet TAKE 1 TABLET BY MOUTH TWICE A DAY (Patient taking differently: Take 20 mg by mouth in the morning and at bedtime. ) 180 tablet 3  . oxyCODONE-acetaminophen (PERCOCET/ROXICET) 5-325 MG tablet Take 1 tablet by mouth every 6 (six) hours as needed for severe pain. 10 tablet 0  . pantoprazole (PROTONIX) 40 MG tablet TAKE 1 TABLET BY MOUTH TWICE (2) DAILY (Patient taking differently: Take 40 mg by mouth 2 (two) times daily. ) 180 tablet 3  . SYMBICORT 160-4.5 MCG/ACT inhaler INHALE 2 PUFFS INTO THE LUNGS TWICE DAILY (Patient taking differently: Inhale 2 puffs into the lungs in the morning and at bedtime. ) 10.2 g 5  . VENTOLIN HFA 108 (90 Base)  MCG/ACT inhaler INHALE 2 PUFFS INTO THE LUNGS EVERY 6 HOURS AS NEEDED FOR WHEEZING OR SHORTNESS OF BREATH. (Patient taking differently: Inhale 2 puffs into the lungs every 6 (six) hours as needed for wheezing or shortness of breath. ) 18 g 0   No current facility-administered medications on file prior to visit.    BP 130/72   Pulse 90   Temp (!) 97 F (36.1 C) (Temporal)   Ht 5\' 3"  (1.6 m)   Wt 162 lb (73.5 kg)   SpO2 98%   BMI 28.70 kg/m    Objective:   Physical Exam  Constitutional: She appears well-nourished.  Cardiovascular: Normal rate and regular rhythm.  Respiratory: Effort normal and breath sounds normal.  GI: Soft. Bowel sounds are normal.  Mild tenderness to suprapubic region   Skin: Skin is warm and dry.           Assessment & Plan:  Abdominal Pain:  Acute episode with diarrhea, nausea earlier this week two days after intercourse.  Work up in the ED without cause for symptoms. Doing much better now. Continue to monitor.   Labs, imaging, notes reviewed.   Pleas Koch, NP

## 2019-09-17 ENCOUNTER — Telehealth (INDEPENDENT_AMBULATORY_CARE_PROVIDER_SITE_OTHER): Payer: 59 | Admitting: Psychiatry

## 2019-09-17 ENCOUNTER — Other Ambulatory Visit: Payer: Self-pay

## 2019-09-17 DIAGNOSIS — F0630 Mood disorder due to known physiological condition, unspecified: Secondary | ICD-10-CM | POA: Diagnosis not present

## 2019-09-17 DIAGNOSIS — S069X9S Unspecified intracranial injury with loss of consciousness of unspecified duration, sequela: Secondary | ICD-10-CM

## 2019-09-17 DIAGNOSIS — S069XAS Unspecified intracranial injury with loss of consciousness status unknown, sequela: Secondary | ICD-10-CM
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
## 2019-09-28 ENCOUNTER — Ambulatory Visit (HOSPITAL_COMMUNITY): Payer: 59 | Admitting: Psychiatry

## 2019-10-01 ENCOUNTER — Ambulatory Visit (INDEPENDENT_AMBULATORY_CARE_PROVIDER_SITE_OTHER): Payer: 59 | Admitting: Psychiatry

## 2019-10-01 ENCOUNTER — Other Ambulatory Visit: Payer: Self-pay

## 2019-10-01 DIAGNOSIS — S069X9S Unspecified intracranial injury with loss of consciousness of unspecified duration, sequela: Secondary | ICD-10-CM

## 2019-10-01 DIAGNOSIS — S069XAS Unspecified intracranial injury with loss of consciousness status unknown, sequela: Secondary | ICD-10-CM

## 2019-10-01 DIAGNOSIS — F0630 Mood disorder due to known physiological condition, unspecified: Secondary | ICD-10-CM | POA: Diagnosis not present

## 2019-10-01 NOTE — Progress Notes (Signed)
Virtual Visit via Video Note  I connected with Katie Robbins on 10/01/19 at 10:00 AM EDT by a video enabled telemedicine application and verified that I am speaking with the correct person using two identifiers.   I discussed the limitations of evaluation and management by telemedicine and the availability of in person appointments. The patient expressed understanding and agreed to proceed.  I provided 50  minutes of non-face-to-face time during this encounter.   [name], LCSW    THERAPIST PROGRESS NOTE  Session Time: Thursday 10/01/2019 10:00 AM - 10:50 AM   Participation Level: Active  Behavioral Response: AlertEuthymic/talkative  Type of Therapy: Individual Therapy  Treatment Goals addressed:  Patient will learn coping skills to better manage anxiety and depressivesymptoms to increase level of functioning. She would like to improve self-esteem, and accept her newidentitypost-TBI.  Interventions: CBT and Supportive  Summary: Katie Robbins is a 64 y.o. female who is referred for services for continuity of care while her previous therapist is on medical leave.  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 She reports being impatient, becoming irritated with husband easily, having difficulty understanding people's conversations, pattern of interrupting conversations, being impulsive and saying things she thinks she shouldn't say, and a pattern of avoiding places.   Patient last was seen via virtual visit about 2 weeks ago.  She continues to  present with mild anxiety and mild depression.  She  States mood has  been up and down and experiencing irritability mainly due to pain. She reports pain in her shoulders was triggered by overexerting self.  She reports recognizing this affected her interaction with husband and reports using a snappy tone of voice.  However, she is pleased she recognized this and later apologized to husband.  She  still has difficulty accepting her changed physical functioning but has begun to try to pace self .  She still expresses guilt she cannot do what she used to do.  She has maintained involvement in various activities including doing household tasks, cooking dinner at times and socialization with family and friends.  She expresses more positive comments about self in session.   Suicidal/Homicidal: Nowithout intent/plan  Therapist Response: reviewed symptoms, praised and reinforced patient's continued behavioral activation, praised and reinforced patient's completion of homework, discussed effects, praised and reinforced patient's increased efforts to pace self and link with her values to promote consistency in pacing self, assisted patient identify and verbalize positive aspects of self and day to day functioning, developed plan with patient to continue to write one compliment about self and journal   Plan: Return again in 2 weeks.  Diagnosis: Axis I: Mood disorder as late effect of TBI, Anxiety Disorder due to general medical condition        [name], LCSW 10/01/2019

## 2019-10-03 ENCOUNTER — Other Ambulatory Visit (HOSPITAL_COMMUNITY): Payer: Self-pay | Admitting: Psychiatry

## 2019-10-20 ENCOUNTER — Ambulatory Visit (INDEPENDENT_AMBULATORY_CARE_PROVIDER_SITE_OTHER): Payer: 59 | Admitting: Psychiatry

## 2019-10-20 ENCOUNTER — Other Ambulatory Visit: Payer: Self-pay

## 2019-10-20 DIAGNOSIS — S069XAS Unspecified intracranial injury with loss of consciousness status unknown, sequela: Secondary | ICD-10-CM

## 2019-10-20 DIAGNOSIS — S069X9S Unspecified intracranial injury with loss of consciousness of unspecified duration, sequela: Secondary | ICD-10-CM | POA: Diagnosis not present

## 2019-10-20 DIAGNOSIS — F0630 Mood disorder due to known physiological condition, unspecified: Secondary | ICD-10-CM

## 2019-10-20 DIAGNOSIS — F410 Panic disorder [episodic paroxysmal anxiety] without agoraphobia: Secondary | ICD-10-CM | POA: Diagnosis not present
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
## 2019-10-22 DIAGNOSIS — Z6827 Body mass index (BMI) 27.0-27.9, adult: Secondary | ICD-10-CM | POA: Insufficient documentation

## 2019-11-03 ENCOUNTER — Other Ambulatory Visit: Payer: Self-pay

## 2019-11-03 ENCOUNTER — Ambulatory Visit (INDEPENDENT_AMBULATORY_CARE_PROVIDER_SITE_OTHER): Payer: 59 | Admitting: Psychiatry

## 2019-11-03 DIAGNOSIS — S069XAS Unspecified intracranial injury with loss of consciousness status unknown, sequela: Secondary | ICD-10-CM

## 2019-11-03 DIAGNOSIS — S069X9S Unspecified intracranial injury with loss of consciousness of unspecified duration, sequela: Secondary | ICD-10-CM | POA: Diagnosis not present

## 2019-11-03 DIAGNOSIS — F0630 Mood disorder due to known physiological condition, unspecified: Secondary | ICD-10-CM
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
## 2019-11-10 ENCOUNTER — Other Ambulatory Visit: Payer: Self-pay

## 2019-11-10 ENCOUNTER — Telehealth (INDEPENDENT_AMBULATORY_CARE_PROVIDER_SITE_OTHER): Payer: 59 | Admitting: Psychiatry

## 2019-11-10 DIAGNOSIS — F410 Panic disorder [episodic paroxysmal anxiety] without agoraphobia: Secondary | ICD-10-CM | POA: Diagnosis not present

## 2019-11-10 DIAGNOSIS — F0630 Mood disorder due to known physiological condition, unspecified: Secondary | ICD-10-CM | POA: Diagnosis not present

## 2019-11-10 DIAGNOSIS — S069X9S Unspecified intracranial injury with loss of consciousness of unspecified duration, sequela: Secondary | ICD-10-CM

## 2019-11-10 DIAGNOSIS — S069XAS Unspecified intracranial injury with loss of consciousness status unknown, sequela: Secondary | ICD-10-CM

## 2019-11-10 MED ORDER — AMPHETAMINE-DEXTROAMPHETAMINE 20 MG PO TABS: 20.0000 mg | ORAL_TABLET | Freq: Three times a day (TID) | ORAL | 0 refills | Status: DC

## 2019-11-10 MED ORDER — SERTRALINE HCL 100 MG PO TABS: ORAL_TABLET | ORAL | 0 refills | Status: DC

## 2019-11-10 MED ORDER — DIAZEPAM 5 MG PO TABS: 5.0000 mg | ORAL_TABLET | Freq: Three times a day (TID) | ORAL | 2 refills | Status: DC | PRN
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
## 2019-11-27 ENCOUNTER — Other Ambulatory Visit: Payer: Self-pay | Admitting: Primary Care

## 2019-11-27 ENCOUNTER — Encounter: Payer: Self-pay | Admitting: Primary Care

## 2019-11-27 ENCOUNTER — Ambulatory Visit: Payer: 59 | Admitting: Primary Care

## 2019-11-27 ENCOUNTER — Other Ambulatory Visit: Payer: Self-pay

## 2019-11-27 DIAGNOSIS — E039 Hypothyroidism, unspecified: Secondary | ICD-10-CM

## 2019-11-27 DIAGNOSIS — G3184 Mild cognitive impairment, so stated: Secondary | ICD-10-CM

## 2019-11-27 DIAGNOSIS — S069X9S Unspecified intracranial injury with loss of consciousness of unspecified duration, sequela: Secondary | ICD-10-CM | POA: Diagnosis not present

## 2019-11-27 DIAGNOSIS — R7303 Prediabetes: Secondary | ICD-10-CM

## 2019-11-27 DIAGNOSIS — I10 Essential (primary) hypertension: Secondary | ICD-10-CM

## 2019-11-27 DIAGNOSIS — K224 Dyskinesia of esophagus: Secondary | ICD-10-CM

## 2019-11-27 DIAGNOSIS — J4540 Moderate persistent asthma, uncomplicated: Secondary | ICD-10-CM

## 2019-11-27 DIAGNOSIS — N183 Chronic kidney disease, stage 3 unspecified: Secondary | ICD-10-CM

## 2019-11-27 DIAGNOSIS — S069XAS Unspecified intracranial injury with loss of consciousness status unknown, sequela: Secondary | ICD-10-CM

## 2019-11-27 DIAGNOSIS — M5010 Cervical disc disorder with radiculopathy, unspecified cervical region: Secondary | ICD-10-CM

## 2019-11-27 DIAGNOSIS — E2839 Other primary ovarian failure: Secondary | ICD-10-CM

## 2019-11-27 DIAGNOSIS — F0630 Mood disorder due to known physiological condition, unspecified: Secondary | ICD-10-CM

## 2019-11-27 DIAGNOSIS — IMO0001 Reserved for inherently not codable concepts without codable children: Secondary | ICD-10-CM

## 2019-11-27 DIAGNOSIS — K219 Gastro-esophageal reflux disease without esophagitis: Secondary | ICD-10-CM

## 2019-11-27 LAB — T4, FREE: Free T4: 0.84 ng/dL (ref 0.60–1.60)

## 2019-11-27 LAB — LIPID PANEL
Cholesterol: 200 mg/dL (ref 0–200)
HDL: 78.5 mg/dL (ref >39.00)
LDL Cholesterol: 87 mg/dL (ref 0–99)
NonHDL: 121.93
Total CHOL/HDL Ratio: 3
Triglycerides: 177 mg/dL — ABNORMAL HIGH (ref 0.0–149.0)
VLDL: 35.4 mg/dL (ref 0.0–40.0)

## 2019-11-27 LAB — BASIC METABOLIC PANEL
BUN: 19 mg/dL (ref 6–23)
CO2: 27 mEq/L (ref 19–32)
Calcium: 9 mg/dL (ref 8.4–10.5)
Chloride: 99 mEq/L (ref 96–112)
Creatinine, Ser: 1.09 mg/dL (ref 0.40–1.20)
GFR: 50.51 mL/min — ABNORMAL LOW (ref >60.00)
Glucose, Bld: 92 mg/dL (ref 70–99)
Potassium: 4.5 mEq/L (ref 3.5–5.1)
Sodium: 133 mEq/L — ABNORMAL LOW (ref 135–145)

## 2019-11-27 LAB — HEMOGLOBIN A1C: Hgb A1c MFr Bld: 5.7 % (ref 4.6–6.5)

## 2019-11-27 LAB — TSH: TSH: 3.84 u[IU]/mL (ref 0.35–4.50)

## 2019-11-27 NOTE — Progress Notes (Signed)
Subjective:    Patient ID: Katie Robbins, female    DOB: 11/22/1955, 64 y.o.   MRN: [id number]  HPI  This visit occurred during the SARS-CoV-2 public health emergency.  Safety protocols were in place, including screening questions prior to the visit, additional usage of staff PPE, and extensive cleaning of exam room while observing appropriate contact time as indicated for disinfecting solutions.   [name] is a 64 year old female with a history of asthma, hypothyroidism, osteoarthritis, chronic headaches, CKD, prediabetes, mood disorder from TBI, GERD who presents today for follow up.  She would like to have her thyroid rechecked given recent symptoms of increased fatigue and weight gain. She is compliant to her levothyroxine and is taking 100 mcg Sunday through Thursday and 112 mcg on Friday and Saturday.  She is taking her levothyroxine every morning on an empty stomach with water only. No other medications or food for 1-2 hours. She waits to take PPI until lunch time.   Doing well overall. Continues to follow with neurosurgery for her neck, has also resumed physical therapy for dry needling.   Following with psychiatry and psychology, no longer on Lexapro, now on Zoloft and increased to 100 mg last week. Using Valium sparingly. Compliant to Adderall TID.   BP Readings from Last 3 Encounters:  11/27/19 128/70  09/10/19 130/72  09/02/19 138/82     Review of Systems  Constitutional: Positive for fatigue.  Eyes: Negative for visual disturbance.  Respiratory: Negative for shortness of breath.   Cardiovascular: Negative for palpitations.  Musculoskeletal: Positive for arthralgias.  Psychiatric/Behavioral: The patient is not nervous/anxious.        Past Medical History:  Diagnosis Date  . Anxiety   . Arthritis   . Asthma   . Chicken pox   . Chronic headaches   . Diverticulitis   . Essential hypertension   . GERD (gastroesophageal reflux disease)   . Guillain Barr  syndrome ([place]) 1982  . H/O breast implant   . Hypertension   . Hypothyroidism   . Interstitial cystitis   . Interstitial cystitis   . Mitral prolapse 1985  . Nutcracker esophagus      Social History   Socioeconomic History  . Marital status: Married    Spouse name: Katie Robbins  . Number of children: 2  . Years of education: Some college  . Highest education level: Not on file  Occupational History  . Occupation: Retired  Tobacco Use  . Smoking status: Never Smoker  . Smokeless tobacco: Never Used  Vaping Use  . Vaping Use: Never used  Substance and Sexual Activity  . Alcohol use: Yes    Comment: socially  . Drug use: No  . Sexual activity: Yes    Birth control/protection: Surgical  Other Topics Concern  . 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     Social Determinants of Health   Financial Resource Strain:   . Difficulty of Paying Living Expenses:   Food Insecurity:   . Worried About Charity fundraiser in the Last Year:   . Arboriculturist in the Last Year:   Transportation Needs:   . Film/video editor (Medical):   Marland Kitchen Lack of Transportation (Non-Medical):   Physical Activity:   . Days of Exercise per Week:   . Minutes of Exercise per Session:   Stress:   . Feeling  of Stress :   Social Connections:   . Frequency of Communication with Friends and Family:   . Frequency of Social Gatherings with Friends and Family:   . Attends Religious Services:   . Active Member of Clubs or Organizations:   . Attends Archivist Meetings:   Marland Kitchen Marital Status:   Intimate Partner Violence:   . Fear of Current or Ex-Partner:   . Emotionally Abused:   Marland Kitchen Physically Abused:   . Sexually Abused:     Past Surgical History:  Procedure Laterality Date  . ABDOMINAL HYSTERECTOMY  2000/2009  . APPENDECTOMY  2007  . BOWEL RESECTION  2007  . CARDIAC CATHETERIZATION  2016   Clean  . CARPAL TUNNEL  RELEASE Right   . CHOLECYSTECTOMY  2012  . CHOLECYSTECTOMY  2012  . NECK SURGERY  06/11/2016   plates and screws in neck   . SMALL INTESTINE SURGERY  2007   Bowel resection  . TONSILLECTOMY    . VESICO-VAGINAL FISTULA REPAIR  2007    Family History  Problem Relation Age of Onset  . Hypertension Mother   . GER disease Mother   . Polymyalgia rheumatica Mother   . Arthritis Mother   . GER disease Father     Allergies  Allergen Reactions  . Bactrim [Sulfamethoxazole-Trimethoprim] Hives  . Macrobid [Nitrofurantoin Macrocrystal] Hives  . Tizanidine Other (See Comments)    Bad Mood changs  . Vicodin [Hydrocodone-Acetaminophen] Swelling    Puffy eyes and face  . Trileptal [Oxcarbazepine] Rash  . Hydrocodone   . Nexium [Esomeprazole Magnesium]     Face swelling, hives   . Prilosec [Omeprazole]     Face swelling, hives   . Sulfa Antibiotics   . Wasp Venom Hives    swelling  . Erythromycin Rash    Current Outpatient Medications on File Prior to Visit  Medication Sig Dispense Refill  . amLODipine (NORVASC) 5 MG tablet Take 1 tablet by mouth once daily for blood pressure. 90 tablet 3  . amphetamine-dextroamphetamine (ADDERALL) 20 MG tablet Take 1 tablet (20 mg total) by mouth in the morning, at noon, and at bedtime. 90 tablet 0  . BD PEN NEEDLE NANO U/F 32G X 4 MM MISC USE ONE  4 TIMES DAILY WITH  INSULIN  PEN 300 each 1  . cyanocobalamin (,VITAMIN B-12,) 1000 MCG/ML injection INJECT 1 VIAL (1 ML) INTO THE MUSCLE EVERY 3 TO 4 WEEKS (Patient taking differently: Inject 1,000 mcg into the muscle See admin instructions. Every 3 to 4 weeks) 24 mL 1  . [START ON 12/07/2019] diazepam (VALIUM) 5 MG tablet Take 1 tablet (5 mg total) by mouth 3 (three) times daily as needed for anxiety. 90 tablet 2  . EPINEPHrine 0.3 mg/0.3 mL IJ SOAJ injection Inject 0.3 mLs (0.3 mg total) into the muscle as needed for anaphylaxis. 1 each 0  . estradiol (ESTRACE) 1 MG tablet Take 1 mg by mouth daily.    Marland Kitchen  levothyroxine (SYNTHROID) 100 MCG tablet TAKE 1 TABLET BY MOUTH ONCE A DAY. TAKE ON AN EMPTY STOMACH WITH A GLASS OF WATER ATLEAST 30-60 MIN BEFORE BREAKFAST 90 tablet 0  . levothyroxine (SYNTHROID) 112 MCG tablet Taking 112 mcg by mouth on Friday and Saturday.    . magic mouthwash SOLN Take 15 mLs by mouth 3 (three) times daily as needed for mouth pain (Modified Magic Mouthwash). 360 mL 0  . olmesartan (BENICAR) 20 MG tablet TAKE 1 TABLET BY MOUTH TWICE A DAY (Patient  taking differently: Take 20 mg by mouth in the morning and at bedtime. ) 180 tablet 3  . oxyCODONE-acetaminophen (PERCOCET/ROXICET) 5-325 MG tablet Take 1 tablet by mouth every 6 (six) hours as needed for severe pain. 10 tablet 0  . pantoprazole (PROTONIX) 40 MG tablet TAKE 1 TABLET BY MOUTH TWICE (2) DAILY (Patient taking differently: Take 40 mg by mouth 2 (two) times daily. ) 180 tablet 3  . sertraline (ZOLOFT) 100 MG tablet Take 0.5 tablets (50 mg total) by mouth daily for 7 days, THEN 1 tablet (100 mg total) daily. 63.5 tablet 0  . SYMBICORT 160-4.5 MCG/ACT inhaler INHALE 2 PUFFS INTO THE LUNGS TWICE DAILY (Patient taking differently: Inhale 2 puffs into the lungs in the morning and at bedtime. ) 10.2 g 5  . VENTOLIN HFA 108 (90 Base) MCG/ACT inhaler INHALE 2 PUFFS INTO THE LUNGS EVERY 6 HOURS AS NEEDED FOR WHEEZING OR SHORTNESS OF BREATH. (Patient taking differently: Inhale 2 puffs into the lungs every 6 (six) hours as needed for wheezing or shortness of breath. ) 18 g 0  . amphetamine-dextroamphetamine (ADDERALL) 20 MG tablet Take 1 tablet (20 mg total) by mouth 3 (three) times daily after meals. 90 tablet 0  . amphetamine-dextroamphetamine (ADDERALL) 20 MG tablet Take 1 tablet (20 mg total) by mouth in the morning, at noon, and at bedtime. 90 tablet 0   No current facility-administered medications on file prior to visit.    BP 128/70   Pulse 86   Temp (!) 95.9 F (35.5 C) (Temporal)   Ht 5\' 3"  (1.6 m)   Wt 161 lb 8 oz (73.3  kg)   SpO2 98%   BMI 28.61 kg/m    Objective:   Physical Exam Cardiovascular:     Rate and Rhythm: Normal rate and regular rhythm.  Pulmonary:     Effort: Pulmonary effort is normal.     Breath sounds: Normal breath sounds.  Musculoskeletal:     Cervical back: Neck supple.  Skin:    General: Skin is warm and dry.  Neurological:     Mental Status: She is alert.  Psychiatric:        Mood and Affect: Mood normal.            Assessment & Plan:

## 2019-11-27 NOTE — Assessment & Plan Note (Signed)
Doing well on Symbicort BID, lungs clear today. Continue same.

## 2019-11-27 NOTE — Patient Instructions (Addendum)
Stop by the lab prior to leaving today. I will notify you of your results once received.   Schedule your mammogram as discussed.   Be sure to exercise 150 minutes weekly.  Continue to work on a healthy diet. Ensure you are consuming 64 ounces of water daily.  It was a pleasure to see you today!

## 2019-11-27 NOTE — Assessment & Plan Note (Signed)
Following with nephrology, no recent visit. Repeat renal function pending today.

## 2019-11-27 NOTE — Assessment & Plan Note (Signed)
Last TSH within normal range, she is noticing some symptoms. She is taking her levothyroxine doses correctly. Compliant to both 100 mcg and 112 mcg.  Repeat TSH pending.

## 2019-11-27 NOTE — Assessment & Plan Note (Signed)
Now on Zoloft for which was just increased to 100 mg. Continues on Adderall TID. Following with therapy and neuropsychology. Continue valium as needed.

## 2019-11-27 NOTE — Assessment & Plan Note (Signed)
Following with neurosurgery, also participating in dry needling with PT.

## 2019-11-27 NOTE — Assessment & Plan Note (Signed)
Repeat A1C pending. 

## 2019-11-27 NOTE — Assessment & Plan Note (Addendum)
Following with GYN, compliant to estradiol 1 mg daily. Continue same. Mammogram due and she will schedule.

## 2019-11-27 NOTE — Assessment & Plan Note (Signed)
Working with psychiatry and therapy, recently increased on Zoloft to 100 mg. Continue Zoloft, Addreall and PRN valium. Appointment due soon.

## 2019-11-27 NOTE — Assessment & Plan Note (Signed)
Doing well on pantoprazole BID, continue same.

## 2019-11-27 NOTE — Assessment & Plan Note (Signed)
Well controlled in the office today, continue amlodipine and olmesartan. CMP pending.

## 2019-11-30 ENCOUNTER — Other Ambulatory Visit: Payer: Self-pay

## 2019-11-30 ENCOUNTER — Ambulatory Visit (INDEPENDENT_AMBULATORY_CARE_PROVIDER_SITE_OTHER): Payer: 59 | Admitting: Psychiatry

## 2019-11-30 DIAGNOSIS — F0630 Mood disorder due to known physiological condition, unspecified: Secondary | ICD-10-CM | POA: Diagnosis not present

## 2019-11-30 DIAGNOSIS — S069X9S Unspecified intracranial injury with loss of consciousness of unspecified duration, sequela: Secondary | ICD-10-CM

## 2019-11-30 DIAGNOSIS — S069XAS Unspecified intracranial injury with loss of consciousness status unknown, sequela: Secondary | ICD-10-CM

## 2019-11-30 NOTE — Progress Notes (Signed)
Virtual Visit via Video Note  I connected with Katie Robbins on 11/30/19 at 11:08 AM EDT  by a video enabled telemedicine application and verified that I am speaking with the correct person using two identifiers.   I discussed the limitations of evaluation and management by telemedicine and the availability of in person appointments. The patient expressed understanding and agreed to proceed.   I provided 42 minutes of non-face-to-face time during this encounter.   [name], LCSW    THERAPIST PROGRESS NOTE  Location:  Patient - Home/ Provider - [facility] [place] office  Session Time: Monday 11/30/2019 11:08 AM - 11:50 AM    Participation Level: Active  Behavioral Response: Alert/Anxious/Talkative   Type of Therapy: Individual Therapy  Treatment Goals addressed:  Patient will learn coping skills to better manage anxiety and depressivesymptoms to increase level of functioning. She would like to improve self-esteem, and accept her newidentitypost-TBI.  Interventions: CBT and Supportive  Summary: Katie Robbins is a 64 y.o. female who is referred for services for continuity of care while her previous therapist is on medical leave.  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 She reports being impatient, becoming irritated with husband easily, having difficulty understanding people's conversations, pattern of interrupting conversations, being impulsive and saying things she thinks she shouldn't say, and a pattern of avoiding places.   Patient last was seen via virtual visit about 4 weeks ago.  She continues to  present with mild anxiety and mild depression.  She reports discontinuing Lexapro and starting to take Zoloft as instructed by her psychiatrist Dr. Montel Culver. Per her report, she is feeling better and has been less angry as well as less combative.  She reports improved interaction with husband.  She also has been seeing  her doctor regarding neck pain and reports this has been reduced.  She has resumed interest and involvement in activities including housework as well as socializing with friends.  She continues to experience poor sleep hygiene and says she has difficulty going to bed.  She reports wanting to have time for self.  However, she reports this interferes with her morning routine as she wakes up tired.  Patient reports difficulty scheduling time for self during the day as she has a pattern of taking care of tasks.her thought patterns regarding completing task include should and ought.   Suicidal/Homicidal: Nowithout intent/plan  Therapist Response: reviewed symptoms, praised and reinforced patient's involvement in activity/socialization, assisted patient identify thoughts and processes that inhibited establishing consistent bedtime and improving sleep hygiene, discussed patient pacing self and finding balance between responsibilities/self-care-pleasurable activities, discussed using a daily/weekly planner to assist patient with scheduling/budgeting her time to pace self and achieve balance, will email patient blank daily/weekly planner, assisted patient identify/challenge/ and replace thought patterns about task completion/time for self with healthy alternative,     Plan: Return again in 2 weeks.  Diagnosis: Axis I: Mood disorder as late effect of TBI, Anxiety Disorder due to general medical condition        [name], LCSW 11/30/2019

## 2019-12-02 NOTE — Telephone Encounter (Signed)
Vallarie Mare, I sent her a result note regarding her thyroid medication and she has not yet gotten back with me. See my result note and see if she'd like to increase the 112 mcg to three days, and take the 100 mcg four days.

## 2019-12-02 NOTE — Telephone Encounter (Signed)
Message left for patient to return my call.  

## 2019-12-03 NOTE — Telephone Encounter (Signed)
Message left for patient to return my call.  

## 2019-12-07 NOTE — Telephone Encounter (Signed)
Message left for patient to return my call.  

## 2019-12-09 NOTE — Telephone Encounter (Signed)
Message left for patient to return my call.  

## 2019-12-11 MED ORDER — LEVOTHYROXINE SODIUM 112 MCG PO TABS: ORAL_TABLET | ORAL | 1 refills | Status: DC

## 2019-12-11 NOTE — Telephone Encounter (Signed)
Message left for patient to return my call.  

## 2019-12-11 NOTE — Telephone Encounter (Signed)
Noted, prescriptions changed and updated, sent to pharmacy.

## 2019-12-11 NOTE — Telephone Encounter (Signed)
Spoken and notified patient of Tawni Millers comments. Verbalized understanding. Patient stated that she viewed the comments on MyChart and already doing that was instructed. Please refill

## 2019-12-14 ENCOUNTER — Ambulatory Visit (INDEPENDENT_AMBULATORY_CARE_PROVIDER_SITE_OTHER): Payer: 59 | Admitting: Psychiatry

## 2019-12-14 ENCOUNTER — Other Ambulatory Visit: Payer: Self-pay

## 2019-12-14 ENCOUNTER — Other Ambulatory Visit (HOSPITAL_COMMUNITY): Payer: Self-pay | Admitting: Psychiatry

## 2019-12-14 DIAGNOSIS — S069X9S Unspecified intracranial injury with loss of consciousness of unspecified duration, sequela: Secondary | ICD-10-CM

## 2019-12-14 DIAGNOSIS — F0630 Mood disorder due to known physiological condition, unspecified: Secondary | ICD-10-CM

## 2019-12-14 DIAGNOSIS — S069XAS Unspecified intracranial injury with loss of consciousness status unknown, sequela: Secondary | ICD-10-CM

## 2019-12-14 NOTE — Progress Notes (Signed)
Virtual Visit via Video Note  I connected with [name] on 12/14/19 at 11:00 AM EDT by a video enabled telemedicine application and verified that I am speaking with the correct person using two identifiers.   I discussed the limitations of evaluation and management by telemedicine and the availability of in person appointments. The patient expressed understanding and agreed to proceed.  I provided 40 minutes of non-face-to-face time during this encounter.     [name], LCSW    THERAPIST PROGRESS NOTE  Location:  Patient - Home/ Provider - [place] office  Session Time: Monday 8/2/20212021 11:05 AM - 11:45 AM   Participation Level: Active  Behavioral Response: Alert/Anxious/Talkative   Type of Therapy: Individual Therapy  Treatment Goals addressed:  Patient will learn coping skills to better manage anxiety and depressivesymptoms to increase level of functioning. She would like to improve self-esteem, and accept her newidentitypost-TBI.  Interventions: CBT and Supportive  Summary: Jarrah Seher is a 64 y.o. female who is referred for services for continuity of care while her previous therapist is on medical leave.  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 She reports being impatient, becoming irritated with husband easily, having difficulty understanding people's conversations, pattern of interrupting conversations, being impulsive and saying things she thinks she shouldn't say, and a pattern of avoiding places.   Patient last was seen via virtual visit about 4 weeks ago.  She continues to  present with mild anxiety and mild depression.  She reports continuing to feel better and says the Zoloft has been very helpful.  She reports coping better.  She reports using the daily/weekly planner and says it has been helpful in pacing  self as well as having more balance.  She also has improved her daily structure.   She also is beginning to have more realistic expectations of self and is starting to become more aware of should and ought thought patterns. She is beginning to let some some things go regarding perfectionistic standards of completing tasks and says this has been helpful.  She has improved sleep hygiene by going to bed a little earlier.  She is very pleased that her sister-in-law has been visiting for the past week.  Patient reports increased involvement in activity and is enjoying the time with her sister-in-law.  She reports still experiencing anxiety at times and almost feeling panicked if she has to rush.  Suicidal/Homicidal: Nowithout intent/plan  Therapist Response: reviewed symptoms, praised and reinforced patient's use of daily-weekly planner, discussed effects, praised and reinforced patient's involvement in activity/socialization, praised and reinforced patient's increased awareness of thought patterns and effects on her behavior, praised and reinforced patient's efforts to choose a different response to should and ought thoughts, discussed effects, continue to discuss pacing self regarding needs when scheduling to address feelings of panic related to rushing, also reviewed stress response and use of deep breathing to trigger relaxation response   Plan: Return again in 2 weeks.  Diagnosis: Axis I: Mood disorder as late effect of TBI, Anxiety Disorder due to general medical condition        [name], LCSW 12/14/2019

## 2019-12-21 ENCOUNTER — Telehealth (INDEPENDENT_AMBULATORY_CARE_PROVIDER_SITE_OTHER): Payer: 59 | Admitting: Psychiatry

## 2019-12-21 ENCOUNTER — Other Ambulatory Visit: Payer: Self-pay

## 2019-12-21 DIAGNOSIS — F0630 Mood disorder due to known physiological condition, unspecified: Secondary | ICD-10-CM | POA: Diagnosis not present

## 2019-12-21 DIAGNOSIS — S069X9S Unspecified intracranial injury with loss of consciousness of unspecified duration, sequela: Secondary | ICD-10-CM

## 2019-12-21 DIAGNOSIS — S069XAS Unspecified intracranial injury with loss of consciousness status unknown, sequela: Secondary | ICD-10-CM

## 2019-12-21 DIAGNOSIS — F410 Panic disorder [episodic paroxysmal anxiety] without agoraphobia: Secondary | ICD-10-CM | POA: Diagnosis not present

## 2019-12-21 MED ORDER — AMPHETAMINE-DEXTROAMPHETAMINE 20 MG PO TABS: 20.0000 mg | ORAL_TABLET | Freq: Three times a day (TID) | ORAL | 0 refills | Status: DC

## 2019-12-21 MED ORDER — SERTRALINE HCL 100 MG PO TABS: 100.0000 mg | ORAL_TABLET | Freq: Every day | ORAL | 0 refills | Status: DC
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
## 2019-12-28 ENCOUNTER — Ambulatory Visit (INDEPENDENT_AMBULATORY_CARE_PROVIDER_SITE_OTHER): Payer: 59 | Admitting: Psychiatry

## 2019-12-28 ENCOUNTER — Other Ambulatory Visit: Payer: Self-pay

## 2019-12-28 DIAGNOSIS — S069X9S Unspecified intracranial injury with loss of consciousness of unspecified duration, sequela: Secondary | ICD-10-CM

## 2019-12-28 DIAGNOSIS — F0630 Mood disorder due to known physiological condition, unspecified: Secondary | ICD-10-CM | POA: Diagnosis not present

## 2019-12-28 DIAGNOSIS — S069XAS Unspecified intracranial injury with loss of consciousness status unknown, sequela: Secondary | ICD-10-CM
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
## 2019-12-29 DIAGNOSIS — E039 Hypothyroidism, unspecified: Secondary | ICD-10-CM

## 2019-12-30 ENCOUNTER — Other Ambulatory Visit: Payer: Self-pay | Admitting: Primary Care

## 2019-12-30 DIAGNOSIS — K219 Gastro-esophageal reflux disease without esophagitis: Secondary | ICD-10-CM

## 2020-01-28 ENCOUNTER — Ambulatory Visit: Payer: 59 | Admitting: Primary Care

## 2020-01-28 ENCOUNTER — Other Ambulatory Visit: Payer: Self-pay

## 2020-01-28 ENCOUNTER — Encounter: Payer: Self-pay | Admitting: Primary Care

## 2020-01-28 VITALS — BP 116/62 | HR 68 | Temp 97.6°F | Ht 63.0 in | Wt 157.0 lb

## 2020-01-28 DIAGNOSIS — G8929 Other chronic pain: Secondary | ICD-10-CM

## 2020-01-28 DIAGNOSIS — S069X9S Unspecified intracranial injury with loss of consciousness of unspecified duration, sequela: Secondary | ICD-10-CM

## 2020-01-28 DIAGNOSIS — F0630 Mood disorder due to known physiological condition, unspecified: Secondary | ICD-10-CM

## 2020-01-28 DIAGNOSIS — E039 Hypothyroidism, unspecified: Secondary | ICD-10-CM | POA: Diagnosis not present

## 2020-01-28 DIAGNOSIS — S069XAS Unspecified intracranial injury with loss of consciousness status unknown, sequela: Secondary | ICD-10-CM

## 2020-01-28 DIAGNOSIS — I10 Essential (primary) hypertension: Secondary | ICD-10-CM

## 2020-01-28 DIAGNOSIS — Z1231 Encounter for screening mammogram for malignant neoplasm of breast: Secondary | ICD-10-CM | POA: Diagnosis not present

## 2020-01-28 DIAGNOSIS — K219 Gastro-esophageal reflux disease without esophagitis: Secondary | ICD-10-CM

## 2020-01-28 DIAGNOSIS — R519 Headache, unspecified: Secondary | ICD-10-CM | POA: Diagnosis not present

## 2020-01-28 DIAGNOSIS — F410 Panic disorder [episodic paroxysmal anxiety] without agoraphobia: Secondary | ICD-10-CM

## 2020-01-28 LAB — CBC WITH DIFFERENTIAL/PLATELET
Basophils Absolute: 0.1 10*3/uL (ref 0.0–0.1)
Basophils Relative: 0.7 % (ref 0.0–3.0)
Eosinophils Absolute: 0.1 10*3/uL (ref 0.0–0.7)
Eosinophils Relative: 1.6 % (ref 0.0–5.0)
HCT: 35.8 % — ABNORMAL LOW (ref 36.0–46.0)
Hemoglobin: 12 g/dL (ref 12.0–15.0)
Lymphocytes Relative: 26.1 % (ref 12.0–46.0)
Lymphs Abs: 2.2 10*3/uL (ref 0.7–4.0)
MCHC: 33.6 g/dL (ref 30.0–36.0)
MCV: 91.5 fl (ref 78.0–100.0)
Monocytes Absolute: 0.6 10*3/uL (ref 0.1–1.0)
Monocytes Relative: 7 % (ref 3.0–12.0)
Neutro Abs: 5.4 10*3/uL (ref 1.4–7.7)
Neutrophils Relative %: 64.6 % (ref 43.0–77.0)
Platelets: 260 10*3/uL (ref 150.0–400.0)
RBC: 3.91 Mil/uL (ref 3.87–5.11)
RDW: 14 % (ref 11.5–15.5)
WBC: 8.3 10*3/uL (ref 4.0–10.5)

## 2020-01-28 LAB — TSH: TSH: 2.85 u[IU]/mL (ref 0.35–4.50)

## 2020-01-28 LAB — C-REACTIVE PROTEIN: CRP: 1.7 mg/dL (ref 0.5–20.0)

## 2020-01-28 LAB — SEDIMENTATION RATE: Sed Rate: 10 mm/hr (ref 0–30)

## 2020-01-28 MED ORDER — MAGIC MOUTHWASH: 15.0000 mL | Freq: Three times a day (TID) | ORAL | 0 refills | Status: DC | PRN

## 2020-01-28 NOTE — Assessment & Plan Note (Signed)
She is taking both doses of her levothyroxine correctly.  TSH today within normal range.  Continue current regimen.

## 2020-01-28 NOTE — Progress Notes (Signed)
Subjective:    Patient ID: Katie Robbins, female    DOB: 30-Jan-1956, 64 y.o.   MRN: [id number]  HPI  This visit occurred during the SARS-CoV-2 public health emergency.  Safety protocols were in place, including screening questions prior to the visit, additional usage of staff PPE, and extensive cleaning of exam room while observing appropriate contact time as indicated for disinfecting solutions.   Katie Robbins is a 64 year old female with a significant medical history including neurocognitive disorder secondary to TBI, hypothyroidism, CKD, ADHD, prediabetes, osteoarthritis, GERD, asthma, panic disorder who presents today to discuss several symptoms. She is also due for repeat TSH.   She is also needing a refill of her magic mouthwash for which she uses for to numb her esophagus as needed for pain and spasms.  History of nutcracker esophagus.  Symptoms include left sided temporal headaches that radiate up and down. This began early Summer 2021. She's noticed the left temporal region being tender upon palpation at times.  Her mother has a history of GCA, underwent biopsy for confirmation. She is currently following with physical therapy weekly for dry needling of her neck.   She is compliant to her levothyroxine doses of 100 mcg Sunday through Wednesday and 112 mcg Thursday through Saturday.  She is taking correctly.  Also with continued panic attacks that occur when she has to go out in public or when she feels bad. Also with continued depression. Follows with neuropsychiatry and therapy, managed on Zoloft 100 mg, Adderall 20 mg, diazepam. She is scheduled to see Dr. Montel Culver in early October. She sees her therapist every other week. Since starting Zoloft she's noticed feeling more off balanced, diarrhea, intermittent dizziness.  She intends on mentioning this to her psychiatrist.  BP Readings from Last 3 Encounters:  01/28/20 116/62  11/27/19 128/70  09/10/19 130/72   Wt Readings from Last  3 Encounters:  01/28/20 157 lb (71.2 kg)  11/27/19 161 lb 8 oz (73.3 kg)  09/10/19 162 lb (73.5 kg)     Review of Systems  Skin: Negative for color change.  Neurological: Positive for headaches.  Psychiatric/Behavioral:       See HPI       Past Medical History:  Diagnosis Date  . Anxiety   . Arthritis   . Asthma   . Chicken pox   . Chronic headaches   . Diverticulitis   . Essential hypertension   . GERD (gastroesophageal reflux disease)   . Guillain Barr syndrome ([place]) 1982  . H/O breast implant   . Hypertension   . Hypothyroidism   . Interstitial cystitis   . Interstitial cystitis   . Mitral prolapse 1985  . Nutcracker esophagus      Social History   Socioeconomic History  . Marital status: Married    Spouse name: Katie Robbins  . Number of children: 2  . Years of education: Some college  . Highest education level: Not on file  Occupational History  . Occupation: Retired  Tobacco Use  . Smoking status: Never Smoker  . Smokeless tobacco: Never Used  Vaping Use  . Vaping Use: Never used  Substance and Sexual Activity  . Alcohol use: Yes    Comment: socially  . Drug use: No  . Sexual activity: Yes    Birth control/protection: Surgical  Other Topics Concern  . 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  Social Determinants of Health   Financial Resource Strain:   . Difficulty of Paying Living Expenses: Not on file  Food Insecurity:   . Worried About Charity fundraiser in the Last Year: Not on file  . Ran Out of Food in the Last Year: Not on file  Transportation Needs:   . Lack of Transportation (Medical): Not on file  . Lack of Transportation (Non-Medical): Not on file  Physical Activity:   . Days of Exercise per Week: Not on file  . Minutes of Exercise per Session: Not on file  Stress:   . Feeling of Stress : Not on file  Social Connections:   . Frequency of Communication  with Friends and Family: Not on file  . Frequency of Social Gatherings with Friends and Family: Not on file  . Attends Religious Services: Not on file  . Active Member of Clubs or Organizations: Not on file  . Attends Archivist Meetings: Not on file  . Marital Status: Not on file  Intimate Partner Violence:   . Fear of Current or Ex-Partner: Not on file  . Emotionally Abused: Not on file  . Physically Abused: Not on file  . Sexually Abused: Not on file    Past Surgical History:  Procedure Laterality Date  . ABDOMINAL HYSTERECTOMY  2000/2009  . APPENDECTOMY  2007  . BOWEL RESECTION  2007  . CARDIAC CATHETERIZATION  2016   Clean  . CARPAL TUNNEL RELEASE Right   . CHOLECYSTECTOMY  2012  . CHOLECYSTECTOMY  2012  . NECK SURGERY  06/11/2016   plates and screws in neck   . SMALL INTESTINE SURGERY  2007   Bowel resection  . TONSILLECTOMY    . VESICO-VAGINAL FISTULA REPAIR  2007    Family History  Problem Relation Age of Onset  . Hypertension Mother   . GER disease Mother   . Polymyalgia rheumatica Mother   . Arthritis Mother   . GER disease Father     Allergies  Allergen Reactions  . Bactrim [Sulfamethoxazole-Trimethoprim] Hives  . Macrobid [Nitrofurantoin Macrocrystal] Hives  . Tizanidine Other (See Comments)    Bad Mood changs  . Vicodin [Hydrocodone-Acetaminophen] Swelling    Puffy eyes and face  . Trileptal [Oxcarbazepine] Rash  . Hydrocodone   . Nexium [Esomeprazole Magnesium]     Face swelling, hives   . Prilosec [Omeprazole]     Face swelling, hives   . Sulfa Antibiotics   . Wasp Venom Hives    swelling  . Erythromycin Rash    Current Outpatient Medications on File Prior to Visit  Medication Sig Dispense Refill  . amLODipine (NORVASC) 5 MG tablet TAKE 1 TABLET BY MOUTH ONCE DAILY FOR BLOOD PRESSURE 90 tablet 3  . amphetamine-dextroamphetamine (ADDERALL) 20 MG tablet TAKE 1 TABLET BY MOUTH IN THE MORNING, AT NOON, AND AT BEDTIME 90 tablet 0  .  [START ON 02/12/2020] amphetamine-dextroamphetamine (ADDERALL) 20 MG tablet Take 1 tablet (20 mg total) by mouth 3 (three) times daily after meals. 90 tablet 0  . amphetamine-dextroamphetamine (ADDERALL) 20 MG tablet Take 1 tablet (20 mg total) by mouth in the morning, at noon, and at bedtime. 90 tablet 0  . cyanocobalamin (,VITAMIN B-12,) 1000 MCG/ML injection INJECT 1 VIAL (1 ML) INTO THE MUSCLE EVERY 3 TO 4 WEEKS (Patient taking differently: Inject 1,000 mcg into the muscle See admin instructions. Every 3 to 4 weeks) 24 mL 1  . diazepam (VALIUM) 5 MG tablet Take 1 tablet (5  mg total) by mouth 3 (three) times daily as needed for anxiety. 90 tablet 2  . EPINEPHrine 0.3 mg/0.3 mL IJ SOAJ injection Inject 0.3 mLs (0.3 mg total) into the muscle as needed for anaphylaxis. 1 each 0  . estradiol (ESTRACE) 1 MG tablet Take 1 mg by mouth daily.    Marland Kitchen levothyroxine (SYNTHROID) 100 MCG tablet TAKE 1 TABLET BY MOUTH ON Sunday, Monday, Tuesday, Wednesday on AN EMPTY STOMACH WITH WATER ONLY. Take at least 30-60 MIN BEFORE BREAKFAST. 48 tablet 1  . levothyroxine (SYNTHROID) 112 MCG tablet Take 1 tablet by mouth on Thursday, Friday, Saturday on an empty stomach with water only.  No food or other medications for 30 minutes. 36 tablet 1  . olmesartan (BENICAR) 20 MG tablet TAKE 1 TABLET BY MOUTH TWICE A DAY (Patient taking differently: Take 20 mg by mouth in the morning and at bedtime. ) 180 tablet 3  . pantoprazole (PROTONIX) 40 MG tablet TAKE 1 TABLET BY MOUTH TWICE DAILY 180 tablet 3  . sertraline (ZOLOFT) 100 MG tablet Take 1 tablet (100 mg total) by mouth daily. 90 tablet 0  . SYMBICORT 160-4.5 MCG/ACT inhaler INHALE 2 PUFFS INTO THE LUNGS TWICE DAILY (Patient taking differently: Inhale 2 puffs into the lungs in the morning and at bedtime. ) 10.2 g 5  . VENTOLIN HFA 108 (90 Base) MCG/ACT inhaler INHALE 2 PUFFS INTO THE LUNGS EVERY 6 HOURS AS NEEDED FOR WHEEZING OR SHORTNESS OF BREATH. (Patient taking differently:  Inhale 2 puffs into the lungs every 6 (six) hours as needed for wheezing or shortness of breath. ) 18 g 0   No current facility-administered medications on file prior to visit.    BP 116/62   Pulse 68   Temp 97.6 F (36.4 C) (Temporal)   Ht 5\' 3"  (1.6 m)   Wt 157 lb (71.2 kg)   SpO2 98%   BMI 27.81 kg/m    Objective:   Physical Exam Cardiovascular:     Rate and Rhythm: Normal rate and regular rhythm.  Pulmonary:     Effort: Pulmonary effort is normal.     Breath sounds: Normal breath sounds.  Musculoskeletal:     Cervical back: Neck supple.  Skin:    General: Skin is warm and dry.  Psychiatric:        Mood and Affect: Mood normal.            Assessment & Plan:

## 2020-01-28 NOTE — Patient Instructions (Addendum)
Stop by the lab prior to leaving today. I will notify you of your results once received.   It was a pleasure to see you today!   You have an appointment scheduled for: []   2D Mammogram  [x]   3D Mammogram  []   Bone Density   Call for appointment    Your appointment will at the following location  [x]   [facility]      [place] [place], [place]            Make sure to wear two peace clothing  No lotions powders or deodorants the day of the appointment Make sure to bring picture ID and insurance card.  Bring list of medications you are currently taking including any supplements.

## 2020-01-28 NOTE — Assessment & Plan Note (Signed)
Continued.  Continue with biweekly therapy, recommended she mention this to her psychiatrist at her upcoming visit.

## 2020-01-28 NOTE — Assessment & Plan Note (Signed)
Well-controlled in the office today, do not suspect this to be contributing to her headache.  Continue to monitor.

## 2020-01-28 NOTE — Assessment & Plan Note (Signed)
Seems to be experiencing side effects from Zoloft, we discussed that she will mention this to her psychiatrist at her upcoming visit.  Continue with biweekly therapy.

## 2020-01-28 NOTE — Assessment & Plan Note (Signed)
Chronic generalized headaches, but over the last 3 months she has developed a new type of headache to the left temporal region.  Given history of GCA and her mother we need to rule this out.  She was tender on exam to left temporal region.  Labs today including sed rate, CRP, CBC unremarkable. Will likely need to obtain imaging of the head to evaluate.  Hold off on temporal artery biopsy given negative labs and without acute symptoms.  Will contact patient regarding head imaging.

## 2020-02-01 ENCOUNTER — Telehealth: Payer: Self-pay | Admitting: Primary Care

## 2020-02-01 NOTE — Telephone Encounter (Signed)
Patient called in stating she is returning call from [place] in regards to results. Please advise.

## 2020-02-01 NOTE — Telephone Encounter (Signed)
See lab notes

## 2020-02-02 ENCOUNTER — Other Ambulatory Visit: Payer: Self-pay | Admitting: Primary Care

## 2020-02-02 DIAGNOSIS — R519 Headache, unspecified: Secondary | ICD-10-CM

## 2020-02-03 ENCOUNTER — Ambulatory Visit
Admission: RE | Admit: 2020-02-03 | Discharge: 2020-02-03 | Disposition: A | Discharge status: Home / Self Care | Payer: 59 | Source: Ambulatory Visit | Attending: Primary Care | Admitting: Primary Care

## 2020-02-03 ENCOUNTER — Telehealth: Payer: Self-pay

## 2020-02-03 ENCOUNTER — Encounter: Payer: Self-pay | Admitting: Primary Care

## 2020-02-03 ENCOUNTER — Other Ambulatory Visit: Payer: Self-pay

## 2020-02-03 DIAGNOSIS — Z1231 Encounter for screening mammogram for malignant neoplasm of breast: Secondary | ICD-10-CM

## 2020-02-03 DIAGNOSIS — R140 Abdominal distension (gaseous): Secondary | ICD-10-CM

## 2020-02-03 NOTE — Telephone Encounter (Signed)
Patient dropped off family history information. Forms are located in a envelope and on the cart in the front.

## 2020-02-03 NOTE — Telephone Encounter (Addendum)
Yes, that is fine. We will need to obtain point-of-care urinalysis and urine culture. Please give her instructions on clean-catch urine.  She may need to come pick up the kit.

## 2020-02-03 NOTE — Telephone Encounter (Signed)
Patient contacted the office and states that she was seen in the office on 01/28/20 and had labs done. She states she meant to mention to Anda Kraft that she is having lots of lower abdominal pressure, and thinks she has a UTI. She states that she would like to come in and drop off a urine sample if [place] agrees. Please advise.

## 2020-02-04 ENCOUNTER — Other Ambulatory Visit: Payer: Self-pay

## 2020-02-04 ENCOUNTER — Other Ambulatory Visit (INDEPENDENT_AMBULATORY_CARE_PROVIDER_SITE_OTHER): Payer: 59

## 2020-02-04 DIAGNOSIS — R300 Dysuria: Secondary | ICD-10-CM

## 2020-02-04 LAB — POC URINALSYSI DIPSTICK (AUTOMATED)
Bilirubin, UA: NEGATIVE
Blood, UA: NEGATIVE
Glucose, UA: NEGATIVE
Ketones, UA: NEGATIVE
Leukocytes, UA: NEGATIVE
Nitrite, UA: NEGATIVE
Protein, UA: POSITIVE — AB
Spec Grav, UA: 1.025 (ref 1.010–1.025)
Urobilinogen, UA: 0.2 E.U./dL
pH, UA: 6 (ref 5.0–8.0)

## 2020-02-04 NOTE — Telephone Encounter (Signed)
Patient called gave information. She will come by and give urine sample. I have put in order and made lab appointment.

## 2020-02-04 NOTE — Telephone Encounter (Addendum)
I placed some forms in your inbox from her, this is her mother's medical history. I was not sure if she wanted it back, but I did review and update her chart.  Please have her remind me, who does she see now for GI? What does she see them for?  I am happy to place the referral, I just need some additional information.

## 2020-02-04 NOTE — Telephone Encounter (Signed)
Did not see this is it in your box?

## 2020-02-04 NOTE — Addendum Note (Signed)
Addended by: Francella Solian on: 02/04/2020 02:06 PM   Modules accepted: Orders

## 2020-02-04 NOTE — Telephone Encounter (Signed)
Patient also requested referral to Dr. Collene Mares. She has ongoing GI issues.

## 2020-02-04 NOTE — Telephone Encounter (Signed)
I did receive this, and have updated her chart. I put her mothers health history form back in your inbox.  See other phone note.

## 2020-02-04 NOTE — Telephone Encounter (Signed)
Left message to return call to our office.  

## 2020-02-04 NOTE — Telephone Encounter (Signed)
Patient has not ever been seen by GI she was referred but never made appointment. She has been having bloating abdominal pain and frequent loose stools. Patient states that she discussed this with you at visit but I did not see any note of it.

## 2020-02-05 ENCOUNTER — Ambulatory Visit (INDEPENDENT_AMBULATORY_CARE_PROVIDER_SITE_OTHER): Payer: 59 | Admitting: Psychiatry

## 2020-02-05 ENCOUNTER — Other Ambulatory Visit: Payer: Self-pay

## 2020-02-05 DIAGNOSIS — F0630 Mood disorder due to known physiological condition, unspecified: Secondary | ICD-10-CM | POA: Diagnosis not present

## 2020-02-05 DIAGNOSIS — S069X9S Unspecified intracranial injury with loss of consciousness of unspecified duration, sequela: Secondary | ICD-10-CM

## 2020-02-05 DIAGNOSIS — S069XAS Unspecified intracranial injury with loss of consciousness status unknown, sequela: Secondary | ICD-10-CM

## 2020-02-05 LAB — URINE CULTURE
MICRO NUMBER:: 10987816
Result:: NO GROWTH
SPECIMEN QUALITY:: ADEQUATE

## 2020-02-05 NOTE — Progress Notes (Signed)
  Virtual Visit via Video Note  I connected with Katie Robbins on 02/05/20 at 9:05 AM EDT  by a video enabled telemedicine application and verified that I am speaking with the correct person using two identifiers.   I discussed the limitations of evaluation and management by telemedicine and the availability of in person appointments. The patient expressed understanding and agreed to proceed.  I provided 50 minutes of non-face-to-face time during this encounter.   [name], LCSW       THERAPIST PROGRESS NOTE  Location:  Patient - Home/ Provider - [place] office  Session Time: Friday 02/05/2020 9:05 AM -  9:55 AM   Participation Level: Active  Behavioral Response: Alert/Anxious/Talkative   Type of Therapy: Individual Therapy  Treatment Goals addressed:  Patient will learn coping skills to better manage anxiety and depressivesymptoms to increase level of functioning. She would like to improve self-esteem, and accept her newidentitypost-TBI.  Interventions: CBT and Supportive  Summary: Katie Robbins is a 65 y.o. female who is referred for services for continuity of care while her previous therapist is on medical leave.  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 She reports being impatient, becoming irritated with husband easily, having difficulty understanding people's conversations, pattern of interrupting conversations, being impulsive and saying things she thinks she shouldn't say, and a pattern of avoiding places.   Patient last was seen via virtual visit about 4 weeks ago.  She reports increased symptoms of depression and anxiety including increased ruminating thoughts, sleep difficulty, fatigue, panic attacks, and depressed mood.  She reports experiencing increased health issues including severe headaches and GI issues.  She reports doctor suspects she may have giant cell arteritis and plan to schedule a  brain scan.  Patient suspects Zoloft may be contributing to her GI issues.  She also has been having bladder issues and is scheduled to see her doctor about this in October.  She reports poor sleep hygiene and difficulty with daily planning/routine.  However, she has been trying to work outside in her flower beds.  She states having a good day here and there since last session. She reports the good days occurred when she gave herself permission to relax and not expect things of self she used to do when she was younger.    Suicidal/Homicidal: Nowithout intent/plan  Therapist Response: reviewed symptoms, discussed stressors, facilitated expression of thoughts and feelings, validated feelings, assisted patient examine the connection between her thoughts/mood/behavior, assisted patient examine her thought patterns of should and ought regarding level of activity at this stage in her life and replace with healthy alternative, developed plan with patient to read statements daily, reviewed the connection between sleep hygiene and emotional health, assisted patient identify ways to improve sleep hygiene and developed plan with patient to improve with met bedtime ritual and consistent bedtime, assisted patient identify and address thoughts and processes that may inhibit implementation of plan, reviewed stress and anxiety response and ways to trigger relaxation response with the use of deep breathing, developed plan with patient to practice deep breathing daily,   Plan: Return again in 2 weeks.  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

## 2020-02-05 NOTE — Telephone Encounter (Signed)
Noted, referral placed.  

## 2020-02-05 NOTE — Addendum Note (Signed)
Addended by: Pleas Koch on: 02/05/2020 01:18 PM   Modules accepted: Orders

## 2020-02-08 NOTE — Telephone Encounter (Signed)
Left message to return call to our office.  Need to know if she would like back or do we need to scan in chart?

## 2020-02-18 NOTE — Telephone Encounter (Signed)
Fyi.

## 2020-02-19 ENCOUNTER — Other Ambulatory Visit: Payer: Self-pay

## 2020-02-19 ENCOUNTER — Ambulatory Visit (INDEPENDENT_AMBULATORY_CARE_PROVIDER_SITE_OTHER): Payer: 59 | Admitting: Psychiatry

## 2020-02-19 DIAGNOSIS — F0630 Mood disorder due to known physiological condition, unspecified: Secondary | ICD-10-CM | POA: Diagnosis not present

## 2020-02-19 DIAGNOSIS — S069XAS Unspecified intracranial injury with loss of consciousness status unknown, sequela: Secondary | ICD-10-CM

## 2020-02-19 DIAGNOSIS — S069X9S Unspecified intracranial injury with loss of consciousness of unspecified duration, sequela: Secondary | ICD-10-CM
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
## 2020-02-22 ENCOUNTER — Ambulatory Visit
Admission: RE | Admit: 2020-02-22 | Discharge: 2020-02-22 | Disposition: A | Discharge status: Home / Self Care | Payer: 59 | Source: Ambulatory Visit | Attending: Primary Care | Admitting: Primary Care

## 2020-02-22 ENCOUNTER — Other Ambulatory Visit: Payer: Self-pay

## 2020-02-22 ENCOUNTER — Telehealth (INDEPENDENT_AMBULATORY_CARE_PROVIDER_SITE_OTHER): Payer: 59 | Admitting: Psychiatry

## 2020-02-22 DIAGNOSIS — S069XAS Unspecified intracranial injury with loss of consciousness status unknown, sequela: Secondary | ICD-10-CM

## 2020-02-22 DIAGNOSIS — S069X9S Unspecified intracranial injury with loss of consciousness of unspecified duration, sequela: Secondary | ICD-10-CM | POA: Diagnosis not present

## 2020-02-22 DIAGNOSIS — F0630 Mood disorder due to known physiological condition, unspecified: Secondary | ICD-10-CM | POA: Diagnosis not present

## 2020-02-22 DIAGNOSIS — R519 Headache, unspecified: Secondary | ICD-10-CM

## 2020-02-22 DIAGNOSIS — F410 Panic disorder [episodic paroxysmal anxiety] without agoraphobia: Secondary | ICD-10-CM | POA: Diagnosis not present

## 2020-02-22 MED ORDER — AMPHETAMINE-DEXTROAMPHETAMINE 20 MG PO TABS
20.0000 mg | ORAL_TABLET | Freq: Three times a day (TID) | ORAL | 0 refills | Status: DC
Start: 2020-03-14 — End: 2020-03-23

## 2020-02-22 MED ORDER — AMPHETAMINE-DEXTROAMPHETAMINE 20 MG PO TABS: 20.0000 mg | ORAL_TABLET | Freq: Three times a day (TID) | ORAL | 0 refills | Status: DC

## 2020-02-22 MED ORDER — SERTRALINE HCL 20 MG/ML PO CONC: 140.0000 mg | Freq: Every day | ORAL | 1 refills | Status: DC

## 2020-02-22 MED ORDER — DIAZEPAM 5 MG PO TABS: 5.0000 mg | ORAL_TABLET | Freq: Three times a day (TID) | ORAL | 2 refills | Status: DC | PRN
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
## 2020-02-23 ENCOUNTER — Other Ambulatory Visit (HOSPITAL_COMMUNITY): Payer: Self-pay | Admitting: Psychiatry

## 2020-02-23 ENCOUNTER — Other Ambulatory Visit (HOSPITAL_COMMUNITY): Payer: Self-pay | Admitting: *Deleted

## 2020-02-23 MED ORDER — SERTRALINE HCL 100 MG PO TABS: ORAL_TABLET | ORAL | 1 refills | Status: DC

## 2020-03-04 ENCOUNTER — Ambulatory Visit (INDEPENDENT_AMBULATORY_CARE_PROVIDER_SITE_OTHER): Payer: 59 | Admitting: Psychiatry

## 2020-03-04 ENCOUNTER — Other Ambulatory Visit: Payer: Self-pay

## 2020-03-04 DIAGNOSIS — S069XAS Unspecified intracranial injury with loss of consciousness status unknown, sequela: Secondary | ICD-10-CM

## 2020-03-04 DIAGNOSIS — S069X9S Unspecified intracranial injury with loss of consciousness of unspecified duration, sequela: Secondary | ICD-10-CM

## 2020-03-04 DIAGNOSIS — F0630 Mood disorder due to known physiological condition, unspecified: Secondary | ICD-10-CM | POA: Diagnosis not present
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
## 2020-03-09 ENCOUNTER — Telehealth: Payer: Self-pay | Admitting: Primary Care

## 2020-03-09 NOTE — Telephone Encounter (Signed)
[facility] called dr Collene Mares office they cannot see pt

## 2020-03-11 ENCOUNTER — Other Ambulatory Visit: Payer: Self-pay | Admitting: Primary Care

## 2020-03-11 ENCOUNTER — Telehealth: Payer: Self-pay | Admitting: Primary Care

## 2020-03-11 DIAGNOSIS — I10 Essential (primary) hypertension: Secondary | ICD-10-CM

## 2020-03-11 NOTE — Telephone Encounter (Signed)
Patient said she tried to make an appointment with Dr.Mann, but he's not taking new patient.  Patient wants to know who Katie Robbins would recommend. Patient said she can be notified on my chart.

## 2020-03-12 NOTE — Telephone Encounter (Signed)
I recommend anyone in the [place] office.

## 2020-03-12 NOTE — Telephone Encounter (Signed)
Please advise 

## 2020-03-12 NOTE — Telephone Encounter (Signed)
Left message to return call to our office.  

## 2020-03-15 NOTE — Telephone Encounter (Signed)
Left message to return call to our office.  

## 2020-03-16 NOTE — Telephone Encounter (Signed)
Responded to other mychart message.

## 2020-03-17 NOTE — Telephone Encounter (Signed)
Noted  

## 2020-03-18 ENCOUNTER — Ambulatory Visit (HOSPITAL_COMMUNITY): Payer: 59 | Admitting: Psychiatry

## 2020-03-18 ENCOUNTER — Other Ambulatory Visit: Payer: Self-pay

## 2020-03-18 ENCOUNTER — Telehealth (HOSPITAL_COMMUNITY): Payer: Self-pay | Admitting: Psychiatry

## 2020-03-18 NOTE — Telephone Encounter (Signed)
Called patient to reschedule due to provider being out, left voicemail

## 2020-03-20 ENCOUNTER — Telehealth: Payer: Self-pay | Admitting: Primary Care

## 2020-03-20 DIAGNOSIS — R519 Headache, unspecified: Secondary | ICD-10-CM

## 2020-03-20 MED ORDER — AMITRIPTYLINE HCL 25 MG PO TABS: 25.0000 mg | ORAL_TABLET | Freq: Every day | ORAL | 0 refills | Status: DC

## 2020-03-20 NOTE — Telephone Encounter (Signed)
-----   Message from Stephanie Acre, MD sent at 03/16/2020  8:50 PM EDT ----- I am fine with her being put on amitriptyline as long as it is not in excess of 50 mg daily (preferably at night due to sedation especially when combined with diazepam). Higher doses put her at quite high risk for serotonin syndrome when combined with sertraline.   I appreciate your concerns/question.  Maudry Mayhew MD ----- Message ----- From: Pleas Koch, NP Sent: 03/16/2020   5:29 PM EDT To: Stephanie Acre, MD  Dr. Montel Culver,  This patient of ours is experiencing daily headaches for which are quite bothersome. Given her medical history and current medication regimen, I wanted to speak with you first before adding in treatment. Recent CT head negative for acute process.  She was once managed on amitriptyline and did well. She's now on Zoloft, Adderall, and numerous other medications. What are your thoughts regarding adding amitriptyline?  I appreciate your time!  Allie Bossier, NP-[facility]

## 2020-03-20 NOTE — Telephone Encounter (Signed)
Sending Rx for amitriptyline 25 mg to pharmacy [facility] for headache treatment. My Chart message sent to patient.

## 2020-03-23 ENCOUNTER — Ambulatory Visit (INDEPENDENT_AMBULATORY_CARE_PROVIDER_SITE_OTHER): Payer: 59 | Admitting: Gastroenterology

## 2020-03-23 ENCOUNTER — Other Ambulatory Visit: Payer: Self-pay

## 2020-03-23 ENCOUNTER — Encounter: Payer: Self-pay | Admitting: Gastroenterology

## 2020-03-23 VITALS — BP 164/80 | HR 91 | Temp 97.9°F | Ht 63.0 in | Wt 157.0 lb

## 2020-03-23 DIAGNOSIS — R110 Nausea: Secondary | ICD-10-CM

## 2020-03-23 DIAGNOSIS — R1319 Other dysphagia: Secondary | ICD-10-CM | POA: Diagnosis not present

## 2020-03-23 DIAGNOSIS — K529 Noninfective gastroenteritis and colitis, unspecified: Secondary | ICD-10-CM

## 2020-03-23 DIAGNOSIS — R140 Abdominal distension (gaseous): Secondary | ICD-10-CM

## 2020-03-23 DIAGNOSIS — R197 Diarrhea, unspecified: Secondary | ICD-10-CM | POA: Diagnosis not present

## 2020-03-23 MED ORDER — NA SULFATE-K SULFATE-MG SULF 17.5-3.13-1.6 GM/177ML PO SOLN: 354.0000 mL | Freq: Once | ORAL | 0 refills | Status: AC
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
## 2020-03-24 LAB — H. PYLORI BREATH TEST: H pylori Breath Test: NEGATIVE

## 2020-03-31 ENCOUNTER — Ambulatory Visit: Payer: 59 | Admitting: Primary Care

## 2020-03-31 ENCOUNTER — Encounter: Payer: Self-pay | Admitting: Primary Care

## 2020-03-31 ENCOUNTER — Other Ambulatory Visit: Payer: Self-pay

## 2020-03-31 VITALS — BP 134/68 | HR 88 | Temp 97.2°F | Ht 63.0 in | Wt 156.0 lb

## 2020-03-31 DIAGNOSIS — M7989 Other specified soft tissue disorders: Secondary | ICD-10-CM

## 2020-03-31 HISTORY — DX: Other specified soft tissue disorders: M79.89

## 2020-03-31 NOTE — Telephone Encounter (Signed)
Spoke to patient in office she has been seen by GI and has no further questions.

## 2020-03-31 NOTE — Progress Notes (Signed)
Subjective:    Patient ID: Katie Robbins, female    DOB: 1955/08/25, 64 y.o.   MRN: [id number]  HPI  This visit occurred during the SARS-CoV-2 public health emergency.  Safety protocols were in place, including screening questions prior to the visit, additional usage of staff PPE, and extensive cleaning of exam room while observing appropriate contact time as indicated for disinfecting solutions.   Katie Robbins is a 64 year old female with a history of hypertension, asthma, GERD, neurocognitive disorder from TBI, osteoarthritis, CKD, chronic headaches who presents today with a chief complaint of "knot".  She saw her physical therapist recently who noticed a "knot" to the left upper chest. The patient has actually noticed this "knot" for years. She believes the "knot" has grown in size very slowly, can notice the sensation of the "knot" at times when laying flat.   She denies changes in skin color, pain, open wounds, injury/trauma. Her physical therapist believes that this "knot" is a lipoma, was able to move the mass around without pain. It was recommended that she come here today for evaluation.   BP Readings from Last 3 Encounters:  03/31/20 134/68  03/23/20 (!) 164/80  01/28/20 116/62   Wt Readings from Last 3 Encounters:  03/31/20 156 lb (70.8 kg)  03/23/20 157 lb (71.2 kg)  01/28/20 157 lb (71.2 kg)     Review of Systems  Respiratory: Negative for shortness of breath.   Skin: Negative for color change.       Soft tissue skin mass       Past Medical History:  Diagnosis Date  . Anxiety   . Arthritis   . Asthma   . Chicken pox   . Chronic headaches   . Diverticulitis   . Essential hypertension   . GERD (gastroesophageal reflux disease)   . Guillain Barr syndrome ([place]) 1982  . H/O breast implant   . Hypertension   . Hypothyroidism   . Interstitial cystitis   . Interstitial cystitis   . Mitral prolapse 1985  . Nutcracker esophagus      Social History    Socioeconomic History  . Marital status: Married    Spouse name: Katie Robbins  . Number of children: 2  . Years of education: Some college  . Highest education level: Not on file  Occupational History  . Occupation: Retired  Tobacco Use  . Smoking status: Never Smoker  . Smokeless tobacco: Never Used  Vaping Use  . Vaping Use: Never used  Substance and Sexual Activity  . Alcohol use: Yes    Comment: socially  . Drug use: No  . Sexual activity: Yes    Birth control/protection: Surgical  Other Topics Concern  . 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     Social Determinants of Health   Financial Resource Strain:   . Difficulty of Paying Living Expenses: Not on file  Food Insecurity:   . Worried About Charity fundraiser in the Last Year: Not on file  . Ran Out of Food in the Last Year: Not on file  Transportation Needs:   . Lack of Transportation (Medical): Not on file  . Lack of Transportation (Non-Medical): Not on file  Physical Activity:   . Days of Exercise per Week: Not on file  . Minutes of Exercise per Session: Not on file  Stress:   . Feeling of  Stress : Not on file  Social Connections:   . Frequency of Communication with Friends and Family: Not on file  . Frequency of Social Gatherings with Friends and Family: Not on file  . Attends Religious Services: Not on file  . Active Member of Clubs or Organizations: Not on file  . Attends Banker Meetings: Not on file  . Marital Status: Not on file  Intimate Partner Violence:   . Fear of Current or Ex-Partner: Not on file  . Emotionally Abused: Not on file  . Physically Abused: Not on file  . Sexually Abused: Not on file    Past Surgical History:  Procedure Laterality Date  . ABDOMINAL HYSTERECTOMY  2000/2009  . APPENDECTOMY  2007  . BOWEL RESECTION  2007  . CARDIAC CATHETERIZATION  2016   Clean  . CARPAL TUNNEL RELEASE  Right   . CHOLECYSTECTOMY  2012  . CHOLECYSTECTOMY  2012  . NECK SURGERY  06/11/2016   plates and screws in neck   . SMALL INTESTINE SURGERY  2007   Bowel resection  . TONSILLECTOMY    . VESICO-VAGINAL FISTULA REPAIR  2007    Family History  Problem Relation Age of Onset  . Hypertension Mother   . GER disease Mother   . Polymyalgia rheumatica Mother   . COPD Mother   . Osteoarthritis Mother   . Osteoporosis Mother   . Other Mother        Giant Cell Arteritis   . GER disease Father   . Heart attack Father     Allergies  Allergen Reactions  . Bactrim [Sulfamethoxazole-Trimethoprim] Hives  . Macrobid [Nitrofurantoin Macrocrystal] Hives  . Tizanidine Other (See Comments)    Bad Mood changs  . Vicodin [Hydrocodone-Acetaminophen] Swelling    Puffy eyes and face  . Trileptal [Oxcarbazepine] Rash  . Hydrocodone   . Nexium [Esomeprazole Magnesium]     Face swelling, hives   . Prilosec [Omeprazole]     Face swelling, hives   . Sulfa Antibiotics   . Wasp Venom Hives    swelling  . Erythromycin Rash    Current Outpatient Medications on File Prior to Visit  Medication Sig Dispense Refill  . amitriptyline (ELAVIL) 25 MG tablet Take 25 mg by mouth at bedtime.    Marland Kitchen amLODipine (NORVASC) 5 MG tablet TAKE 1 TABLET BY MOUTH ONCE DAILY FOR BLOOD PRESSURE 90 tablet 3  . [START ON 04/13/2020] amphetamine-dextroamphetamine (ADDERALL) 20 MG tablet Take 1 tablet (20 mg total) by mouth in the morning, at noon, and at bedtime. 90 tablet 0  . cyanocobalamin (,VITAMIN B-12,) 1000 MCG/ML injection INJECT 1 VIAL (1 ML) INTO THE MUSCLE EVERY 3 TO 4 WEEKS (Patient taking differently: Inject 1,000 mcg into the muscle See admin instructions. Every 3 to 4 weeks) 24 mL 1  . diazepam (VALIUM) 5 MG tablet Take 1 tablet (5 mg total) by mouth 3 (three) times daily as needed for anxiety. 90 tablet 2  . EPINEPHrine 0.3 mg/0.3 mL IJ SOAJ injection Inject 0.3 mLs (0.3 mg total) into the muscle as needed for  anaphylaxis. 1 each 0  . estradiol (ESTRACE) 1 MG tablet Take 1 mg by mouth daily.    Marland Kitchen levothyroxine (SYNTHROID) 100 MCG tablet TAKE 1 TABLET BY MOUTH ON Sunday, Monday, Tuesday, Wednesday on AN EMPTY STOMACH WITH WATER ONLY. Take at least 30-60 MIN BEFORE BREAKFAST. 48 tablet 1  . levothyroxine (SYNTHROID) 112 MCG tablet Take 1 tablet by mouth on Thursday, Friday, Saturday  on an empty stomach with water only.  No food or other medications for 30 minutes. 36 tablet 1  . magic mouthwash SOLN Take 15 mLs by mouth 3 (three) times daily as needed for mouth pain (Modified Magic Mouthwash). 360 mL 0  . olmesartan (BENICAR) 20 MG tablet TAKE 1 TABLET BY MOUTH TWICE A DAY 180 tablet 3  . pantoprazole (PROTONIX) 40 MG tablet TAKE 1 TABLET BY MOUTH TWICE DAILY 180 tablet 3  . sertraline (ZOLOFT) 100 MG tablet Take 1.5 tablet ($RemoveBef'150mg'fPcAMBDRgO$  total) by mouth daily. Medication may be crushed. 45 tablet 1  . SUPREP BOWEL PREP KIT 17.5-3.13-1.6 GM/177ML SOLN Take by mouth. Will take 04/13/2020    . SYMBICORT 160-4.5 MCG/ACT inhaler INHALE 2 PUFFS INTO THE LUNGS TWICE DAILY (Patient taking differently: Inhale 2 puffs into the lungs in the morning and at bedtime. ) 10.2 g 5  . VENTOLIN HFA 108 (90 Base) MCG/ACT inhaler INHALE 2 PUFFS INTO THE LUNGS EVERY 6 HOURS AS NEEDED FOR WHEEZING OR SHORTNESS OF BREATH. (Patient taking differently: Inhale 2 puffs into the lungs every 6 (six) hours as needed for wheezing or shortness of breath. ) 18 g 0   No current facility-administered medications on file prior to visit.    BP 134/68   Pulse 88   Temp (!) 97.2 F (36.2 C) (Temporal)   Ht $R'5\' 3"'fj$  (1.6 m)   Wt 156 lb (70.8 kg)   SpO2 98%   BMI 27.63 kg/m    Objective:   Physical Exam Pulmonary:     Effort: Pulmonary effort is normal.  Skin:    General: Skin is warm and dry.     Findings: No erythema.     Comments: 4-5 cm rounded soft tissue mass to left upper chest. Soft. Mobile. Non tender. No erythema.   Neurological:      Mental Status: She is alert.  Psychiatric:        Mood and Affect: Mood normal.            Assessment & Plan:

## 2020-03-31 NOTE — Patient Instructions (Signed)
You will be contacted regarding your ultrasound.  Please let us know if you have not been contacted within two weeks.   It was a pleasure to see you today!   Lipoma  A lipoma is a noncancerous (benign) tumor that is made up of fat cells. This is a very common type of soft-tissue growth. Lipomas are usually found under the skin (subcutaneous). They may occur in any tissue of the body that contains fat. Common areas for lipomas to appear include the back, arms, shoulders, buttocks, and thighs. Lipomas grow slowly, and they are usually painless. Most lipomas do not cause problems and do not require treatment. What are the causes? The cause of this condition is not known. What increases the risk? You are more likely to develop this condition if:  You are 108-70 years old.  You have a family history of lipomas. What are the signs or symptoms? A lipoma usually appears as a small, round bump under the skin. In most cases, the lump will:  Feel soft or rubbery.  Not cause pain or other symptoms. However, if a lipoma is located in an area where it pushes on nerves, it can become painful or cause other symptoms. How is this diagnosed? A lipoma can usually be diagnosed with a physical exam. You may also have tests to confirm the diagnosis and to rule out other conditions. Tests may include:  Imaging tests, such as a CT scan or an MRI.  Removal of a tissue sample to be looked at under a microscope (biopsy). How is this treated? Treatment for this condition depends on the size of the lipoma and whether it is causing any symptoms.  For small lipomas that are not causing problems, no treatment is needed.  If a lipoma is bigger or it causes problems, surgery may be done to remove the lipoma. Lipomas can also be removed to improve appearance. Most often, the procedure is done after applying a medicine that numbs the area (local anesthetic).  Liposuction may be done to reduce the size of the lipoma  before it is removed through surgery, or it may be done to remove the lipoma. Lipomas are removed with this method in order to limit incision size and scarring. A liposuction tube is inserted through a small incision into the lipoma, and the contents of the lipoma are removed through the tube with suction. Follow these instructions at home:  Watch your lipoma for any changes.  Keep all follow-up visits as told by your health care provider. This is important. Contact a health care provider if:  Your lipoma becomes larger or hard.  Your lipoma becomes painful, red, or increasingly swollen. These could be signs of infection or a more serious condition. Get help right away if:  You develop tingling or numbness in an area near the lipoma. This could indicate that the lipoma is causing nerve damage. Summary  A lipoma is a noncancerous tumor that is made up of fat cells.  Most lipomas do not cause problems and do not require treatment.  If a lipoma is bigger or it causes problems, surgery may be done to remove the lipoma.  Contact a health care provider if your lipoma becomes larger or hard, or if it becomes painful, red, or increasingly swollen. Pain, redness, and swelling could be signs of infection or a more serious condition. This information is not intended to replace advice given to you by your health care provider. Make sure you discuss any questions you  have with your health care provider. Document Revised: 12/15/2018 Document Reviewed: 12/15/2018 Elsevier Patient Education  [place].

## 2020-03-31 NOTE — Telephone Encounter (Signed)
Spoke to patient she does not need back  Do we need to send to scan?

## 2020-03-31 NOTE — Telephone Encounter (Signed)
Yes, let's scan. Thanks.

## 2020-03-31 NOTE — Assessment & Plan Note (Signed)
Chronic for years, slowly enlarged per patient. Exam today consistent for lipoma.  Soft tissue ultrasound ordered and pending for complete evaluation.

## 2020-04-01 NOTE — Telephone Encounter (Signed)
Placed back in your folder to make sure that nothing further needs to be abstracted. Not able to scan information into chart.

## 2020-04-04 ENCOUNTER — Telehealth: Payer: Self-pay | Admitting: Primary Care

## 2020-04-04 ENCOUNTER — Telehealth: Payer: Self-pay | Admitting: *Deleted

## 2020-04-04 DIAGNOSIS — R300 Dysuria: Secondary | ICD-10-CM

## 2020-04-04 NOTE — Telephone Encounter (Signed)
Attempted to reach patient on Friday 04/01/20 and today 04/04/20 Called both home and mobile numbers on file - LM to return call.   Pt is scheduled with [place] [facility] on Tue 04/05/2020 at [place] 04/05/20 02:00 PM [facility] [place] [facility]   Mychart message sent to patient as well as 2 detailed voicemails left.

## 2020-04-04 NOTE — Telephone Encounter (Signed)
Patient was contacted and will provide a urine specimen tomorrow.

## 2020-04-04 NOTE — Telephone Encounter (Signed)
Noted, okay to allow her to come in for urine specimen only. Can cancel appointment as she was just in the office last week. Will order POC Urine and Culture.

## 2020-04-04 NOTE — Telephone Encounter (Signed)
Patient called stating that she is sure that she has a UTI. Patient stated that she is having burning with urination and wants to just come to the office and pee in a cup. Patient stated that she started with symptoms yesterday and knows that she has a UTI. Advised patient that our providers usually require an office visit and will not let you just come in and leave a urine specimen. . Patient scheduled for an office visit with Allie Bossier NP tomorrow 04/05/20 at 10:20 am. Patient requested that this message go to [facility] in case she will just let her leave a urine specimen.

## 2020-04-05 ENCOUNTER — Ambulatory Visit: Payer: 59 | Admitting: Primary Care

## 2020-04-05 ENCOUNTER — Other Ambulatory Visit: Payer: 59

## 2020-04-05 ENCOUNTER — Other Ambulatory Visit: Payer: Self-pay

## 2020-04-05 ENCOUNTER — Ambulatory Visit
Admission: RE | Admit: 2020-04-05 | Discharge: 2020-04-05 | Disposition: A | Discharge status: Home / Self Care | Payer: 59 | Source: Ambulatory Visit | Attending: Primary Care | Admitting: Primary Care

## 2020-04-05 DIAGNOSIS — M7989 Other specified soft tissue disorders: Secondary | ICD-10-CM

## 2020-04-05 DIAGNOSIS — R300 Dysuria: Secondary | ICD-10-CM

## 2020-04-05 LAB — POC URINALSYSI DIPSTICK (AUTOMATED)
Glucose, UA: NEGATIVE
Ketones, UA: POSITIVE
Nitrite, UA: POSITIVE
Protein, UA: POSITIVE — AB
Spec Grav, UA: 1.025 (ref 1.010–1.025)
Urobilinogen, UA: 0.2 E.U./dL
pH, UA: 6 (ref 5.0–8.0)

## 2020-04-05 NOTE — Telephone Encounter (Signed)
Please sign and close encounter when completed. 

## 2020-04-05 NOTE — Telephone Encounter (Signed)
Called patient she will come by for lab app had been c/a and moved to lab schedule

## 2020-04-06 ENCOUNTER — Other Ambulatory Visit: Payer: Self-pay

## 2020-04-06 MED ORDER — CEPHALEXIN 500 MG PO CAPS: 500.0000 mg | ORAL_CAPSULE | Freq: Three times a day (TID) | ORAL | 0 refills | Status: DC

## 2020-04-08 LAB — URINE CULTURE
MICRO NUMBER:: 11238870
SPECIMEN QUALITY:: ADEQUATE

## 2020-04-10 LAB — GI PROFILE, STOOL, PCR

## 2020-04-11 ENCOUNTER — Telehealth (INDEPENDENT_AMBULATORY_CARE_PROVIDER_SITE_OTHER): Payer: 59 | Admitting: Psychiatry

## 2020-04-11 ENCOUNTER — Other Ambulatory Visit: Payer: Self-pay

## 2020-04-11 ENCOUNTER — Other Ambulatory Visit
Admission: RE | Admit: 2020-04-11 | Discharge: 2020-04-11 | Disposition: A | Discharge status: Home / Self Care | Payer: 59 | Source: Ambulatory Visit | Attending: Gastroenterology | Admitting: Gastroenterology

## 2020-04-11 DIAGNOSIS — S069XAS Unspecified intracranial injury with loss of consciousness status unknown, sequela: Secondary | ICD-10-CM

## 2020-04-11 DIAGNOSIS — F0630 Mood disorder due to known physiological condition, unspecified: Secondary | ICD-10-CM | POA: Diagnosis not present

## 2020-04-11 DIAGNOSIS — Z01812 Encounter for preprocedural laboratory examination: Secondary | ICD-10-CM | POA: Diagnosis not present

## 2020-04-11 DIAGNOSIS — F410 Panic disorder [episodic paroxysmal anxiety] without agoraphobia: Secondary | ICD-10-CM

## 2020-04-11 DIAGNOSIS — S069X9S Unspecified intracranial injury with loss of consciousness of unspecified duration, sequela: Secondary | ICD-10-CM

## 2020-04-11 DIAGNOSIS — Z20822 Contact with and (suspected) exposure to covid-19: Secondary | ICD-10-CM | POA: Diagnosis not present

## 2020-04-11 LAB — SARS CORONAVIRUS 2 (TAT 6-24 HRS): SARS Coronavirus 2: NEGATIVE

## 2020-04-11 MED ORDER — AMPHETAMINE-DEXTROAMPHETAMINE 20 MG PO TABS: 20.0000 mg | ORAL_TABLET | Freq: Three times a day (TID) | ORAL | 0 refills | Status: DC

## 2020-04-11 MED ORDER — DIAZEPAM 5 MG PO TABS: 5.0000 mg | ORAL_TABLET | Freq: Three times a day (TID) | ORAL | 2 refills | Status: AC | PRN

## 2020-04-11 MED ORDER — SERTRALINE HCL 100 MG PO TABS: 100.0000 mg | ORAL_TABLET | Freq: Every day | ORAL | 2 refills | Status: DC
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
## 2020-04-12 ENCOUNTER — Other Ambulatory Visit: Payer: Self-pay

## 2020-04-12 ENCOUNTER — Telehealth: Payer: Self-pay | Admitting: Primary Care

## 2020-04-12 ENCOUNTER — Encounter: Payer: Self-pay | Admitting: Gastroenterology

## 2020-04-12 DIAGNOSIS — I10 Essential (primary) hypertension: Secondary | ICD-10-CM

## 2020-04-12 MED ORDER — OLMESARTAN MEDOXOMIL 20 MG PO TABS: 20.0000 mg | ORAL_TABLET | Freq: Two times a day (BID) | ORAL | 3 refills | Status: DC

## 2020-04-12 NOTE — Telephone Encounter (Signed)
Patient is wanting to change her pharmacy to [facility].  She needs a refill on her olmesartan (Benicar) Please send to [facility]

## 2020-04-13 ENCOUNTER — Ambulatory Visit
Admission: RE | Admit: 2020-04-13 | Discharge: 2020-04-13 | Disposition: A | Discharge status: Home / Self Care | Payer: 59 | Attending: Gastroenterology | Admitting: Gastroenterology

## 2020-04-13 ENCOUNTER — Other Ambulatory Visit: Payer: Self-pay

## 2020-04-13 ENCOUNTER — Ambulatory Visit: Payer: 59 | Admitting: Anesthesiology

## 2020-04-13 ENCOUNTER — Encounter
Admission: RE | Disposition: A | Discharge status: Home / Self Care | Payer: Self-pay | Source: Home / Self Care | Attending: Gastroenterology

## 2020-04-13 DIAGNOSIS — K5730 Diverticulosis of large intestine without perforation or abscess without bleeding: Secondary | ICD-10-CM | POA: Insufficient documentation

## 2020-04-13 DIAGNOSIS — Z888 Allergy status to other drugs, medicaments and biological substances status: Secondary | ICD-10-CM | POA: Insufficient documentation

## 2020-04-13 DIAGNOSIS — D122 Benign neoplasm of ascending colon: Secondary | ICD-10-CM | POA: Diagnosis not present

## 2020-04-13 DIAGNOSIS — K529 Noninfective gastroenteritis and colitis, unspecified: Secondary | ICD-10-CM | POA: Insufficient documentation

## 2020-04-13 DIAGNOSIS — Z79899 Other long term (current) drug therapy: Secondary | ICD-10-CM | POA: Diagnosis not present

## 2020-04-13 DIAGNOSIS — Z882 Allergy status to sulfonamides status: Secondary | ICD-10-CM | POA: Insufficient documentation

## 2020-04-13 DIAGNOSIS — K635 Polyp of colon: Secondary | ICD-10-CM | POA: Diagnosis not present

## 2020-04-13 DIAGNOSIS — K224 Dyskinesia of esophagus: Secondary | ICD-10-CM | POA: Insufficient documentation

## 2020-04-13 DIAGNOSIS — Z91030 Bee allergy status: Secondary | ICD-10-CM | POA: Diagnosis not present

## 2020-04-13 DIAGNOSIS — Z79890 Hormone replacement therapy (postmenopausal): Secondary | ICD-10-CM | POA: Insufficient documentation

## 2020-04-13 DIAGNOSIS — Z881 Allergy status to other antibiotic agents status: Secondary | ICD-10-CM | POA: Diagnosis not present

## 2020-04-13 DIAGNOSIS — R197 Diarrhea, unspecified: Secondary | ICD-10-CM

## 2020-04-13 DIAGNOSIS — Z885 Allergy status to narcotic agent status: Secondary | ICD-10-CM | POA: Diagnosis not present

## 2020-04-13 DIAGNOSIS — R1319 Other dysphagia: Secondary | ICD-10-CM | POA: Diagnosis not present

## 2020-04-13 DIAGNOSIS — R1310 Dysphagia, unspecified: Secondary | ICD-10-CM | POA: Diagnosis present

## 2020-04-13 HISTORY — DX: Cardiac murmur, unspecified: R01.1

## 2020-04-13 HISTORY — PX: COLONOSCOPY WITH PROPOFOL: SHX5780

## 2020-04-13 HISTORY — PX: ESOPHAGOGASTRODUODENOSCOPY (EGD) WITH PROPOFOL: SHX5813

## 2020-04-13 SURGERY — COLONOSCOPY WITH PROPOFOL
Anesthesia: General

## 2020-04-13 MED ORDER — PROPOFOL 500 MG/50ML IV EMUL
INTRAVENOUS | Status: DC | PRN
  Administered 2020-04-13: 140 ug/kg/min via INTRAVENOUS

## 2020-04-13 MED ORDER — SODIUM CHLORIDE 0.9 % IV SOLN
INTRAVENOUS | Status: DC
  Administered 2020-04-13: 20 mL/h via INTRAVENOUS

## 2020-04-13 MED ORDER — PROPOFOL 10 MG/ML IV BOLUS
INTRAVENOUS | Status: DC | PRN
  Administered 2020-04-13: 80 mg via INTRAVENOUS

## 2020-04-13 MED ORDER — LIDOCAINE HCL (CARDIAC) PF 100 MG/5ML IV SOSY
PREFILLED_SYRINGE | INTRAVENOUS | Status: DC | PRN
  Administered 2020-04-13: 100 mg via INTRAVENOUS

## 2020-04-13 NOTE — Op Note (Signed)
[facility] Gastroenterology Patient Name: Katie Robbins Procedure Date: 04/13/2020 9:10 AM MRN: [id number] Account #: [id number] Date of Birth: 1956-01-11 Admit Type: Outpatient Age: 64 Room: [facility] ENDO ROOM 4 Gender: Female Note Status: Finalized Procedure:             Upper GI endoscopy Indications:           Esophageal dysphagia, , Diarrhea, h/o nutcracker                         esophagus, had dilation in the past Providers:             Lin Landsman MD, MD Referring MD:          Pleas Koch (Referring MD) Medicines:             General Anesthesia Complications:         No immediate complications. Estimated blood loss: None. Procedure:             Pre-Anesthesia Assessment:                        - Prior to the procedure, a History and Physical was                         performed, and patient medications and allergies were                         reviewed. The patient is competent. The risks and                         benefits of the procedure and the sedation options and                         risks were discussed with the patient. All questions                         were answered and informed consent was obtained.                         Patient identification and proposed procedure were                         verified by the physician, the nurse, the                         anesthesiologist, the anesthetist and the technician                         in the pre-procedure area in the procedure room in the                         endoscopy suite. Mental Status Examination: alert and                         oriented. Airway Examination: normal oropharyngeal                         airway and neck mobility. Respiratory Examination:  clear to auscultation. CV Examination: normal.                         Prophylactic Antibiotics: The patient does not require                         prophylactic antibiotics. Prior  Anticoagulants: The                         patient has taken no previous anticoagulant or                         antiplatelet agents. ASA Grade Assessment: III - A                         patient with severe systemic disease. After reviewing                         the risks and benefits, the patient was deemed in                         satisfactory condition to undergo the procedure. The                         anesthesia plan was to use general anesthesia.                         Immediately prior to administration of medications,                         the patient was re-assessed for adequacy to receive                         sedatives. The heart rate, respiratory rate, oxygen                         saturations, blood pressure, adequacy of pulmonary                         ventilation, and response to care were monitored                         throughout the procedure. The physical status of the                         patient was re-assessed after the procedure.                        After obtaining informed consent, the endoscope was                         passed under direct vision. Throughout the procedure,                         the patient's blood pressure, pulse, and oxygen                         saturations were monitored continuously. The Endoscope  was introduced through the mouth, and advanced to the                         second part of duodenum. The upper GI endoscopy was                         accomplished without difficulty. The patient tolerated                         the procedure well. Findings:      The examined duodenum was normal. Biopsies for histology were taken with       a cold forceps for evaluation of celiac disease.      The entire examined stomach was normal. Biopsies were taken with a cold       forceps for Helicobacter pylori testing.      The cardia and gastric fundus were normal on retroflexion.      Abnormal  motility was noted in the lower third of the esophagus. The       cricopharyngeus was normal. There is spasticity of the esophageal body.       The distal esophagus/lower esophageal sphincter is spastic, but gives up       passage to the endoscope. A TTS dilator was passed through the scope.       Dilation with a 12-13.5-15 mm balloon and an 18-19-20 mm balloon dilator       was performed to 20 mm. Estimated blood loss: none. Estimated blood       loss: none. Impression:            - Normal examined duodenum. Biopsied.                        - Normal stomach. Biopsied.                        - Abnormal esophageal motility. Dilated. Recommendation:        - Continue present medications.                        - Proceed with colonoscopy as scheduled                        See colonoscopy report Procedure Code(s):     --- Professional ---                        [phone], Esophagogastroduodenoscopy, flexible,                         transoral; with transendoscopic balloon dilation of                         esophagus (less than 30 mm diameter)                        [id number], 59, Esophagogastroduodenoscopy, flexible,                         transoral; with biopsy, single or multiple Diagnosis Code(s):     --- Professional ---  K22.4, Dyskinesia of esophagus                        R13.14, Dysphagia, pharyngoesophageal phase                        R19.7, Diarrhea, unspecified CPT copyright 2019 American Medical Association. All rights reserved. The codes documented in this report are preliminary and upon coder review may  be revised to meet current compliance requirements. Dr. Ulyess Mort Lin Landsman MD, MD 04/13/2020 9:48:53 AM This report has been signed electronically. Number of Addenda: 0 Note Initiated On: 04/13/2020 9:10 AM Estimated Blood Loss:  Estimated blood loss: none.      [facility]

## 2020-04-13 NOTE — Transfer of Care (Signed)
Immediate Anesthesia Transfer of Care Note  Patient: Katie Robbins  Procedure(s) Performed: COLONOSCOPY WITH PROPOFOL (N/A ) ESOPHAGOGASTRODUODENOSCOPY (EGD) WITH PROPOFOL (N/A )  Patient Location: PACU  Anesthesia Type:General  Level of Consciousness: sedated  Airway & Oxygen Therapy: Patient Spontanous Breathing and Patient connected to nasal cannula oxygen  Post-op Assessment: Report given to RN and Post -op Vital signs reviewed and stable  Post vital signs: Reviewed and stable  Last Vitals:  Vitals Value Taken Time  BP 130/74 04/13/20 1011  Temp    Pulse 66 04/13/20 1011  Resp 17 04/13/20 1011  SpO2 100 % 04/13/20 1011  Vitals shown include unvalidated device data.  Last Pain:  Vitals:   04/13/20 0840  TempSrc: Temporal  PainSc: 7          Complications: No complications documented.

## 2020-04-13 NOTE — Op Note (Signed)
[facility] Gastroenterology Patient Name: Katie Robbins Procedure Date: 04/13/2020 9:08 AM MRN: [id number] Account #: [id number] Date of Birth: 05/07/1956 Admit Type: Outpatient Age: 64 Room: [facility] ENDO ROOM 4 Gender: Female Note Status: Finalized Procedure:             Colonoscopy Indications:           Chronic diarrhea, Clinically significant diarrhea of                         unexplained origin Providers:             Lin Landsman MD, MD Referring MD:          Pleas Koch (Referring MD) Medicines:             General Anesthesia Complications:         No immediate complications. Estimated blood loss: None. Procedure:             Pre-Anesthesia Assessment:                        - Prior to the procedure, a History and Physical was                         performed, and patient medications and allergies were                         reviewed. The patient is competent. The risks and                         benefits of the procedure and the sedation options and                         risks were discussed with the patient. All questions                         were answered and informed consent was obtained.                         Patient identification and proposed procedure were                         verified by the physician, the nurse, the                         anesthesiologist, the anesthetist and the technician                         in the pre-procedure area in the procedure room in the                         endoscopy suite. Mental Status Examination: alert and                         oriented. Airway Examination: normal oropharyngeal                         airway and neck mobility. Respiratory Examination:  clear to auscultation. CV Examination: normal.                         Prophylactic Antibiotics: The patient does not require                         prophylactic antibiotics. Prior Anticoagulants: The                          patient has taken no previous anticoagulant or                         antiplatelet agents. ASA Grade Assessment: III - A                         patient with severe systemic disease. After reviewing                         the risks and benefits, the patient was deemed in                         satisfactory condition to undergo the procedure. The                         anesthesia plan was to use general anesthesia.                         Immediately prior to administration of medications,                         the patient was re-assessed for adequacy to receive                         sedatives. The heart rate, respiratory rate, oxygen                         saturations, blood pressure, adequacy of pulmonary                         ventilation, and response to care were monitored                         throughout the procedure. The physical status of the                         patient was re-assessed after the procedure.                        After obtaining informed consent, the colonoscope was                         passed under direct vision. Throughout the procedure,                         the patient's blood pressure, pulse, and oxygen                         saturations were monitored continuously. The  Colonoscope was introduced through the anus and                         advanced to the the terminal ileum, with                         identification of the appendiceal orifice and IC                         valve. The colonoscopy was performed without                         difficulty. The patient tolerated the procedure well.                         The quality of the bowel preparation was evaluated                         using the BBPS [facility] Bowel Preparation Scale) with                         scores of: Right Colon = 3, Transverse Colon = 3 and                         Left Colon = 3 (entire mucosa seen well with no                          residual staining, small fragments of stool or opaque                         liquid). The total BBPS score equals 9. Findings:      The perianal and digital rectal examinations were normal. Pertinent       negatives include normal sphincter tone and no palpable rectal lesions.      The terminal ileum appeared normal.      Normal mucosa was found in the entire colon. Biopsies for histology were       taken with a cold forceps from the entire colon for evaluation of       microscopic colitis.      A 4 mm polyp was found in the ascending colon. The polyp was sessile.       The polyp was removed with a cold snare. Resection and retrieval were       complete.      A diminutive polyp was found in the ascending colon. The polyp was       sessile. The polyp was removed with a cold biopsy forceps. Resection and       retrieval were complete.      Multiple diverticula were found in the recto-sigmoid colon and sigmoid       colon.      The retroflexed view of the distal rectum and anal verge was normal and       showed no anal or rectal abnormalities. Impression:            - The examined portion of the ileum was normal.                        - Normal mucosa in the  entire examined colon. Biopsied.                        - One 4 mm polyp in the ascending colon, removed with                         a cold snare. Resected and retrieved.                        - One diminutive polyp in the ascending colon, removed                         with a cold biopsy forceps. Resected and retrieved.                        - Diverticulosis in the recto-sigmoid colon and in the                         sigmoid colon.                        - The distal rectum and anal verge are normal on                         retroflexion view. Recommendation:        - Discharge patient to home (with escort).                        - Resume previous diet today.                        - Continue present  medications.                        - Await pathology results.                        - Return to my office as previously scheduled. Procedure Code(s):     --- Professional ---                        [phone], Colonoscopy, flexible; with removal of                         tumor(s), polyp(s), or other lesion(s) by snare                         technique                        45380, 31, Colonoscopy, flexible; with biopsy, single                         or multiple Diagnosis Code(s):     --- Professional ---                        K63.5, Polyp of colon                        K52.9, Noninfective gastroenteritis and colitis,  unspecified                        R19.7, Diarrhea, unspecified                        K57.30, Diverticulosis of large intestine without                         perforation or abscess without bleeding CPT copyright 2019 American Medical Association. All rights reserved. The codes documented in this report are preliminary and upon coder review may  be revised to meet current compliance requirements. Dr. Ulyess Mort Lin Landsman MD, MD 04/13/2020 10:07:55 AM This report has been signed electronically. Number of Addenda: 0 Note Initiated On: 04/13/2020 9:08 AM Scope Withdrawal Time: 0 hours 13 minutes 3 seconds  Total Procedure Duration: 0 hours 14 minutes 30 seconds  Estimated Blood Loss:  Estimated blood loss: none.      [facility]

## 2020-04-13 NOTE — Anesthesia Preprocedure Evaluation (Addendum)
Anesthesia Evaluation  Patient identified by MRN, date of birth, ID band Patient awake    Reviewed: Allergy & Precautions, NPO status , Patient's Chart, lab work & pertinent test results  Airway Mallampati: II       Dental no notable dental hx. (+) Teeth Intact   Pulmonary shortness of breath, asthma ,    Pulmonary exam normal        Cardiovascular hypertension, + Valvular Problems/Murmurs  Rhythm:Regular Rate:Normal + Diastolic murmurs    Neuro/Psych  Headaches, PSYCHIATRIC DISORDERS Anxiety  Neuromuscular disease    GI/Hepatic Neg liver ROS, GERD  ,  Endo/Other  Hypothyroidism   Renal/GU CRFRenal disease  negative genitourinary   Musculoskeletal  (+) Arthritis ,   Abdominal   Peds negative pediatric ROS (+)  Hematology negative hematology ROS (+)   Anesthesia Other Findings .Marland KitchenPast Medical History: No date: Anxiety No date: Arthritis No date: Asthma No date: Chicken pox No date: Chronic headaches No date: Diverticulitis No date: Essential hypertension No date: GERD (gastroesophageal reflux disease) 1982: Guillain Barr syndrome (HCC) No date: H/O breast implant No date: Heart murmur No date: Hypertension No date: Hypothyroidism No date: Interstitial cystitis No date: Interstitial cystitis 1985: Mitral prolapse No date: Nutcracker esophagus   Reproductive/Obstetrics negative OB ROS                            Anesthesia Physical Anesthesia Plan  ASA: III  Anesthesia Plan: General   Post-op Pain Management:    Induction:   PONV Risk Score and Plan: 3 and Propofol infusion  Airway Management Planned: Nasal Cannula  Additional Equipment:   Intra-op Plan:   Post-operative Plan:   Informed Consent: I have reviewed the patients History and Physical, chart, labs and discussed the procedure including the risks, benefits and alternatives for the proposed anesthesia with  the patient or authorized representative who has indicated his/her understanding and acceptance.       Plan Discussed with: CRNA, Anesthesiologist and Surgeon  Anesthesia Plan Comments:        Anesthesia Quick Evaluation

## 2020-04-13 NOTE — H&P (Signed)
Katie Darby, MD [address]  [place]  [place], [place] [address]  Main: [phone]  Fax: [phone] Pager: [phone]  Primary Care Physician:  Pleas Koch, NP Primary Gastroenterologist:  Dr. Cephas Robbins  Pre-Procedure History & Physical: HPI:  Katie Robbins is a 64 y.o. female is here for an endoscopy and colonoscopy.   Past Medical History:  Diagnosis Date  . Anxiety   . Arthritis   . Asthma   . Chicken pox   . Chronic headaches   . Diverticulitis   . Essential hypertension   . GERD (gastroesophageal reflux disease)   . Guillain Barr syndrome ([place]) 1982  . H/O breast implant   . Heart murmur   . Hypertension   . Hypothyroidism   . Interstitial cystitis   . Interstitial cystitis   . Mitral prolapse 1985  . Nutcracker esophagus     Past Surgical History:  Procedure Laterality Date  . ABDOMINAL HYSTERECTOMY  2000/2009  . APPENDECTOMY  2007  . BOWEL RESECTION  2007  . BREAST SURGERY    . CARDIAC CATHETERIZATION  2016   Clean  . CARPAL TUNNEL RELEASE Right   . CHOLECYSTECTOMY  2012  . CHOLECYSTECTOMY  2012  . NECK SURGERY  06/11/2016   plates and screws in neck   . SMALL INTESTINE SURGERY  2007   Bowel resection  . TONSILLECTOMY    . VESICO-VAGINAL FISTULA REPAIR  2007    Prior to Admission medications   Medication Sig Start Date End Date Taking? Authorizing Provider  amitriptyline (ELAVIL) 25 MG tablet Take 25 mg by mouth at bedtime.   Yes [provider]  amLODipine (NORVASC) 5 MG tablet TAKE 1 TABLET BY MOUTH ONCE DAILY FOR BLOOD PRESSURE 12/11/19  Yes Pleas Koch, NP  amphetamine-dextroamphetamine (ADDERALL) 20 MG tablet Take 1 tablet (20 mg total) by mouth 3 (three) times daily. 04/11/20 05/11/20 Yes Pucilowski, Olgierd A, MD  amphetamine-dextroamphetamine (ADDERALL) 20 MG tablet Take 1 tablet (20 mg total) by mouth 3 (three) times daily. 05/11/20 06/10/20 Yes Pucilowski, Olgierd A, MD   amphetamine-dextroamphetamine (ADDERALL) 20 MG tablet Take 1 tablet (20 mg total) by mouth 3 (three) times daily. 06/10/20 07/10/20 Yes Pucilowski, Olgierd A, MD  cephALEXin (KEFLEX) 500 MG capsule Take 1 capsule (500 mg total) by mouth 3 (three) times daily. 04/06/20  Yes Pleas Koch, NP  cyanocobalamin (,VITAMIN B-12,) 1000 MCG/ML injection INJECT 1 VIAL (1 ML) INTO THE MUSCLE EVERY 3 TO 4 WEEKS Patient taking differently: Inject 1,000 mcg into the muscle See admin instructions. Every 3 to 4 weeks 06/05/19  Yes Pleas Koch, NP  diazepam (VALIUM) 5 MG tablet Take 1 tablet (5 mg total) by mouth 3 (three) times daily as needed for anxiety. 04/11/20 07/10/20 Yes Pucilowski, Olgierd A, MD  EPINEPHrine 0.3 mg/0.3 mL IJ SOAJ injection Inject 0.3 mLs (0.3 mg total) into the muscle as needed for anaphylaxis. 02/11/19  Yes Pleas Koch, NP  estradiol (ESTRACE) 1 MG tablet Take 1 mg by mouth daily.   Yes [provider]  levothyroxine (SYNTHROID) 100 MCG tablet TAKE 1 TABLET BY MOUTH ON Sunday, Monday, Tuesday, Wednesday on AN EMPTY STOMACH WITH WATER ONLY. Take at least 30-60 MIN BEFORE BREAKFAST. 12/11/19  Yes Pleas Koch, NP  levothyroxine (SYNTHROID) 112 MCG tablet Take 1 tablet by mouth on Thursday, Friday, Saturday on an empty stomach with water only.  No food or other medications for 30 minutes. 12/11/19  Yes Clark,  Leticia Penna, NP  magic mouthwash SOLN Take 15 mLs by mouth 3 (three) times daily as needed for mouth pain (Modified Magic Mouthwash). 01/28/20  Yes Pleas Koch, NP  olmesartan (BENICAR) 20 MG tablet Take 1 tablet (20 mg total) by mouth 2 (two) times daily. 04/12/20  Yes Pleas Koch, NP  pantoprazole (PROTONIX) 40 MG tablet TAKE 1 TABLET BY MOUTH TWICE DAILY 12/30/19  Yes Pleas Koch, NP  sertraline (ZOLOFT) 100 MG tablet Take 1 tablet (100 mg total) by mouth daily. Take 1.5 tablet (165m total) by mouth daily. Medication may be crushed.  04/11/20 07/10/20 Yes Pucilowski, OMarchia Bond MD  SUPREP BOWEL PREP KIT 17.5-3.13-1.6 GM/177ML SOLN Take by mouth. Will take 04/13/2020 03/23/20  Yes [provider]  SYMBICORT 160-4.5 MCG/ACT inhaler INHALE 2 PUFFS INTO THE LUNGS TWICE DAILY Patient taking differently: Inhale 2 puffs into the lungs in the morning and at bedtime.  02/27/19  Yes CPleas Koch NP  VENTOLIN HFA 108 (90 Base) MCG/ACT inhaler INHALE 2 PUFFS INTO THE LUNGS EVERY 6 HOURS AS NEEDED FOR WHEEZING OR SHORTNESS OF BREATH. Patient taking differently: Inhale 2 puffs into the lungs every 6 (six) hours as needed for wheezing or shortness of breath.  07/30/19   CPleas Koch NP    Allergies as of 03/23/2020 - Review Complete 03/23/2020  Allergen Reaction Noted  . Bactrim [sulfamethoxazole-trimethoprim] Hives 03/17/2013  . Macrobid [nitrofurantoin macrocrystal] Hives 03/17/2013  . Tizanidine Other (See Comments) 04/11/2016  . Vicodin [hydrocodone-acetaminophen] Swelling 03/17/2013  . Trileptal [oxcarbazepine] Rash 11/24/2018  . Hydrocodone  08/19/2015  . Nexium [esomeprazole magnesium]  05/29/2017  . Prilosec [omeprazole]  05/29/2017  . Sulfa antibiotics  03/17/2013  . Wasp venom Hives 09/02/2019  . Erythromycin Rash 03/17/2013    Family History  Problem Relation Age of Onset  . Hypertension Mother   . GER disease Mother   . Polymyalgia rheumatica Mother   . COPD Mother   . Osteoarthritis Mother   . Osteoporosis Mother   . Other Mother        Giant Cell Arteritis   . GER disease Father   . Heart attack Father     Social History   Socioeconomic History  . Marital status: Married    Spouse name: JDellis Filbert . Number of children: 2  . Years of education: Some college  . Highest education level: Not on file  Occupational History  . Occupation: Retired  Tobacco Use  . Smoking status: Never Smoker  . Smokeless tobacco: Never Used  Vaping Use  . Vaping Use: Never used  Substance and Sexual  Activity  . Alcohol use: Yes    Comment: socially  . Drug use: No  . Sexual activity: Yes    Birth control/protection: Surgical  Other Topics Concern  . 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     Social Determinants of Health   Financial Resource Strain:   . Difficulty of Paying Living Expenses: Not on file  Food Insecurity:   . Worried About RCharity fundraiserin the Last Year: Not on file  . Ran Out of Food in the Last Year: Not on file  Transportation Needs:   . Lack of Transportation (Medical): Not on file  . Lack of Transportation (Non-Medical): Not on file  Physical Activity:   . Days of Exercise per Week: Not on file  .  Minutes of Exercise per Session: Not on file  Stress:   . Feeling of Stress : Not on file  Social Connections:   . Frequency of Communication with Friends and Family: Not on file  . Frequency of Social Gatherings with Friends and Family: Not on file  . Attends Religious Services: Not on file  . Active Member of Clubs or Organizations: Not on file  . Attends Archivist Meetings: Not on file  . Marital Status: Not on file  Intimate Partner Violence:   . Fear of Current or Ex-Partner: Not on file  . Emotionally Abused: Not on file  . Physically Abused: Not on file  . Sexually Abused: Not on file    Review of Systems: See HPI, otherwise negative ROS  Physical Exam: BP (!) 156/91   Pulse 88   Temp (!) 96.3 F (35.7 C) (Temporal)   Resp 20   Ht _0  (1.6 m)   Wt 68.9 kg   SpO2 100%   BMI 26.93 kg/m  General:   Alert,  pleasant and cooperative in NAD Head:  Normocephalic and atraumatic. Neck:  Supple; no masses or thyromegaly. Lungs:  Clear throughout to auscultation.    Heart:  Regular rate and rhythm. Abdomen:  Soft, nontender and nondistended. Normal bowel sounds, without guarding, and without rebound.   Neurologic:  Alert and  oriented x4;  grossly  normal neurologically.  Impression/Plan: Katie Robbins is here for an endoscopy and colonoscopy to be performed for dysphagia, chronic diarrhea and bloating  Risks, benefits, limitations, and alternatives regarding  endoscopy and colonoscopy have been reviewed with the patient.  Questions have been answered.  All parties agreeable.   Sherri Sear, MD  04/13/2020, 9:16 AM

## 2020-04-13 NOTE — Anesthesia Postprocedure Evaluation (Signed)
Anesthesia Post Note  Patient: Katie Robbins  Procedure(s) Performed: COLONOSCOPY WITH PROPOFOL (N/A ) ESOPHAGOGASTRODUODENOSCOPY (EGD) WITH PROPOFOL (N/A )  Patient location during evaluation: Endoscopy Anesthesia Type: General Level of consciousness: awake and awake and alert Pain management: pain level controlled Vital Signs Assessment: post-procedure vital signs reviewed and stable Respiratory status: spontaneous breathing Cardiovascular status: blood pressure returned to baseline and stable Postop Assessment: no apparent nausea or vomiting Anesthetic complications: no   No complications documented.   Last Vitals:  Vitals:   04/13/20 1020 04/13/20 1030  BP: (!) 135/99 (!) 145/73  Pulse: (!) 58 60  Resp: 12 10  Temp:    SpO2: 100% 100%    Last Pain:  Vitals:   04/13/20 1030  TempSrc:   PainSc: 0-No pain                 Neva Seat

## 2020-04-14 ENCOUNTER — Encounter: Payer: Self-pay | Admitting: Gastroenterology

## 2020-04-14 ENCOUNTER — Telehealth: Payer: Self-pay | Admitting: Primary Care

## 2020-04-14 ENCOUNTER — Telehealth: Payer: Self-pay

## 2020-04-14 LAB — SURGICAL PATHOLOGY

## 2020-04-14 NOTE — Telephone Encounter (Signed)
Vm left at triage.  Pt states bladder infection has not gone away.  Thinks due to GI prep.  Requesting abx refill and Diflucan for yeast infection from abx.  Pt is also calling urologist to get scope done.  Plz call back at [phone].

## 2020-04-14 NOTE — Telephone Encounter (Signed)
Noted, will evaluate with UA and culture.

## 2020-04-14 NOTE — Telephone Encounter (Signed)
Pt called in wanted to let Katie Robbins know that the bladder infection she had before is back but it was due to colonscopy scope. She does have an appointment 12/7 at 10:20 am

## 2020-04-15 ENCOUNTER — Ambulatory Visit: Payer: 59 | Admitting: Primary Care

## 2020-04-15 NOTE — Telephone Encounter (Signed)
Please advise do you want her to bring sample

## 2020-04-15 NOTE — Telephone Encounter (Signed)
App has been made

## 2020-04-15 NOTE — Telephone Encounter (Signed)
She has an appointment for 12/07, right? If she feels like she needs additional medications then she really needs to be seen.  I can squeeze her in today at 11:40 for this issue only.  Okay to cancel the 12/07 appointment.

## 2020-04-19 ENCOUNTER — Encounter: Payer: Self-pay | Admitting: Primary Care

## 2020-04-19 ENCOUNTER — Ambulatory Visit: Payer: 59 | Admitting: Primary Care

## 2020-04-19 ENCOUNTER — Ambulatory Visit (HOSPITAL_COMMUNITY): Payer: 59 | Admitting: Psychiatry

## 2020-04-19 ENCOUNTER — Other Ambulatory Visit: Payer: Self-pay

## 2020-04-19 DIAGNOSIS — I10 Essential (primary) hypertension: Secondary | ICD-10-CM

## 2020-04-19 DIAGNOSIS — R519 Headache, unspecified: Secondary | ICD-10-CM

## 2020-04-19 DIAGNOSIS — R300 Dysuria: Secondary | ICD-10-CM | POA: Diagnosis not present

## 2020-04-19 DIAGNOSIS — G8929 Other chronic pain: Secondary | ICD-10-CM | POA: Diagnosis not present

## 2020-04-19 LAB — POC URINALSYSI DIPSTICK (AUTOMATED)
Bilirubin, UA: NEGATIVE
Blood, UA: NEGATIVE
Glucose, UA: POSITIVE — AB
Ketones, UA: POSITIVE
Leukocytes, UA: NEGATIVE
Nitrite, UA: NEGATIVE
Protein, UA: POSITIVE — AB
Spec Grav, UA: 1.025 (ref 1.010–1.025)
Urobilinogen, UA: 0.2 E.U./dL
pH, UA: 5 (ref 5.0–8.0)

## 2020-04-19 NOTE — Patient Instructions (Signed)
Please return the urine specimen as soon as possible.  It was a pleasure to see you today!

## 2020-04-19 NOTE — Assessment & Plan Note (Signed)
Treated for acute cystitis on 04/05/20, culture positive with E coli, treated with cephalexin for which she completed.  Some symptoms returned last week, now abated.  Repeat UA and culture pending.

## 2020-04-19 NOTE — Assessment & Plan Note (Signed)
Amitriptyline ineffective. She may undergo Botox treatment for abdominal scar tissue treatment, this may in turn help with headaches.  She will update.

## 2020-04-19 NOTE — Progress Notes (Signed)
Subjective:    Patient ID: Katie Robbins, female    DOB: Jan 13, 1956, 64 y.o.   MRN: [id number]  HPI  This visit occurred during the SARS-CoV-2 public health emergency.  Safety protocols were in place, including screening questions prior to the visit, additional usage of staff PPE, and extensive cleaning of exam room while observing appropriate contact time as indicated for disinfecting solutions.   Ms. Hatton is a 64 year old female with a history of acute cystitis, GERD, chronic diarrhea, interstitial cystitis, CKD, hypertension who presents today with a chief complaint of bladder pressure.  She recently saw her GI physician who recommended she stop her olmesartan due to potential GI upset. She stopped amitriptyline recently as it was not helping with headaches. She may go on Botox for abdominal scar tissue.   She notified us via My Chart of symptoms concerning UTI the day after her appointment in person in our office. Since she was just evaluated, we allowed her to come in for a lab appointment to provide a urine specimen. Urine culture returned positive with E coli, treated with cephalexin course.   Five days ago she notified our office that she felt the bladder infection had returned. She underwent colonoscopy the day prior and felt that her perineum was contaminated. She was asked to come in today.  Today she endorses symptoms of dysuria that began four days ago. At the time she was concerned that her UTI had returned. Yesterday morning she noticed bilateral lower abdominal pressure that is intermittent, improved after bowel movements. She woke up this morning and symptoms had abated.   She denies urinary frequency, dysuria, hematuria, flank pain, fevers. She does have some pressure from the bladder that feels like it's compressing the rectum.   BP Readings from Last 3 Encounters:  04/19/20 132/74  04/13/20 (!) 145/73  03/31/20 134/68     Review of Systems  Constitutional:  Negative for fever.  Genitourinary: Negative for dysuria, flank pain, hematuria, urgency and vaginal discharge.       Past Medical History:  Diagnosis Date  . Anxiety   . Arthritis   . Asthma   . Chicken pox   . Chronic headaches   . Diverticulitis   . Essential hypertension   . GERD (gastroesophageal reflux disease)   . Guillain Barr syndrome ([place]) 1982  . H/O breast implant   . Heart murmur   . Hypertension   . Hypothyroidism   . Interstitial cystitis   . Interstitial cystitis   . Mitral prolapse 1985  . Nutcracker esophagus      Social History   Socioeconomic History  . Marital status: Married    Spouse name: [name]  . Number of children: 2  . Years of education: Some college  . Highest education level: Not on file  Occupational History  . Occupation: Retired  Tobacco Use  . Smoking status: Never Smoker  . Smokeless tobacco: Never Used  Vaping Use  . Vaping Use: Never used  Substance and Sexual Activity  . Alcohol use: Yes    Comment: socially  . Drug use: No  . Sexual activity: Yes    Birth control/protection: Surgical  Other Topics Concern  . 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     Social Determinants of Health   Financial Resource Strain:   . Difficulty of Paying Living Expenses: Not on file  Food Insecurity:   . Worried About Programme researcher, broadcasting/film/video in the Last Year: Not on file  . Ran Out of Food in the Last Year: Not on file  Transportation Needs:   . Lack of Transportation (Medical): Not on file  . Lack of Transportation (Non-Medical): Not on file  Physical Activity:   . Days of Exercise per Week: Not on file  . Minutes of Exercise per Session: Not on file  Stress:   . Feeling of Stress : Not on file  Social Connections:   . Frequency of Communication with Friends and Family: Not on file  . Frequency of Social Gatherings with Friends and Family: Not on file  .  Attends Religious Services: Not on file  . Active Member of Clubs or Organizations: Not on file  . Attends Banker Meetings: Not on file  . Marital Status: Not on file  Intimate Partner Violence:   . Fear of Current or Ex-Partner: Not on file  . Emotionally Abused: Not on file  . Physically Abused: Not on file  . Sexually Abused: Not on file    Past Surgical History:  Procedure Laterality Date  . ABDOMINAL HYSTERECTOMY  2000/2009  . APPENDECTOMY  2007  . BOWEL RESECTION  2007  . BREAST SURGERY    . CARDIAC CATHETERIZATION  2016   Clean  . CARPAL TUNNEL RELEASE Right   . CHOLECYSTECTOMY  2012  . CHOLECYSTECTOMY  2012  . COLONOSCOPY WITH PROPOFOL N/A 04/13/2020   Procedure: COLONOSCOPY WITH PROPOFOL;  Surgeon: Toney Reil, MD;  Location: [facility] ENDOSCOPY;  Service: Gastroenterology;  Laterality: N/A;  . ESOPHAGOGASTRODUODENOSCOPY (EGD) WITH PROPOFOL N/A 04/13/2020   Procedure: ESOPHAGOGASTRODUODENOSCOPY (EGD) WITH PROPOFOL;  Surgeon: Toney Reil, MD;  Location: [facility] ENDOSCOPY;  Service: Gastroenterology;  Laterality: N/A;  . NECK SURGERY  06/11/2016   plates and screws in neck   . SMALL INTESTINE SURGERY  2007   Bowel resection  . TONSILLECTOMY    . VESICO-VAGINAL FISTULA REPAIR  2007    Family History  Problem Relation Age of Onset  . Hypertension Mother   . GER disease Mother   . Polymyalgia rheumatica Mother   . COPD Mother   . Osteoarthritis Mother   . Osteoporosis Mother   . Other Mother        Giant Cell Arteritis   . GER disease Father   . Heart attack Father     Allergies  Allergen Reactions  . Bactrim [Sulfamethoxazole-Trimethoprim] Hives  . Macrobid [Nitrofurantoin Macrocrystal] Hives  . Tizanidine Other (See Comments)    Bad Mood changs  . Vicodin [Hydrocodone-Acetaminophen] Swelling    Puffy eyes and face  . Trileptal [Oxcarbazepine] Rash  . Hydrocodone   . Nexium [Esomeprazole Magnesium]     Face swelling, hives   .  Prilosec [Omeprazole]     Face swelling, hives   . Sulfa Antibiotics   . Wasp Venom Hives    swelling  . Erythromycin Rash    Current Outpatient Medications on File Prior to Visit  Medication Sig Dispense Refill  . amLODipine (NORVASC) 5 MG tablet TAKE 1 TABLET BY MOUTH ONCE DAILY FOR BLOOD PRESSURE 90 tablet 3  . amphetamine-dextroamphetamine (ADDERALL) 20 MG tablet Take 1 tablet (20 mg total) by mouth 3 (three) times daily. 90 tablet 0  . [START ON 05/11/2020] amphetamine-dextroamphetamine (ADDERALL) 20 MG tablet Take 1 tablet (20 mg total) by mouth 3 (three) times daily. 90 tablet 0  . [START ON 06/10/2020]  amphetamine-dextroamphetamine (ADDERALL) 20 MG tablet Take 1 tablet (20 mg total) by mouth 3 (three) times daily. 90 tablet 0  . cyanocobalamin (,VITAMIN B-12,) 1000 MCG/ML injection INJECT 1 VIAL (1 ML) INTO THE MUSCLE EVERY 3 TO 4 WEEKS (Patient taking differently: Inject 1,000 mcg into the muscle See admin instructions. Every 3 to 4 weeks) 24 mL 1  . diazepam (VALIUM) 5 MG tablet Take 1 tablet (5 mg total) by mouth 3 (three) times daily as needed for anxiety. 90 tablet 2  . EPINEPHrine 0.3 mg/0.3 mL IJ SOAJ injection Inject 0.3 mLs (0.3 mg total) into the muscle as needed for anaphylaxis. 1 each 0  . estradiol (ESTRACE) 1 MG tablet Take 1 mg by mouth daily.    Marland Kitchen levothyroxine (SYNTHROID) 100 MCG tablet TAKE 1 TABLET BY MOUTH ON Sunday, Monday, Tuesday, Wednesday on AN EMPTY STOMACH WITH WATER ONLY. Take at least 30-60 MIN BEFORE BREAKFAST. 48 tablet 1  . levothyroxine (SYNTHROID) 112 MCG tablet Take 1 tablet by mouth on Thursday, Friday, Saturday on an empty stomach with water only.  No food or other medications for 30 minutes. 36 tablet 1  . magic mouthwash SOLN Take 15 mLs by mouth 3 (three) times daily as needed for mouth pain (Modified Magic Mouthwash). 360 mL 0  . olmesartan (BENICAR) 20 MG tablet Take 1 tablet (20 mg total) by mouth 2 (two) times daily. 180 tablet 3  .  pantoprazole (PROTONIX) 40 MG tablet TAKE 1 TABLET BY MOUTH TWICE DAILY 180 tablet 3  . sertraline (ZOLOFT) 100 MG tablet Take 1 tablet (100 mg total) by mouth daily. Take 1.5 tablet ($RemoveBef'150mg'GWkyFDYnAV$  total) by mouth daily. Medication may be crushed. 30 tablet 2  . SYMBICORT 160-4.5 MCG/ACT inhaler INHALE 2 PUFFS INTO THE LUNGS TWICE DAILY (Patient taking differently: Inhale 2 puffs into the lungs in the morning and at bedtime. ) 10.2 g 5  . VENTOLIN HFA 108 (90 Base) MCG/ACT inhaler INHALE 2 PUFFS INTO THE LUNGS EVERY 6 HOURS AS NEEDED FOR WHEEZING OR SHORTNESS OF BREATH. (Patient taking differently: Inhale 2 puffs into the lungs every 6 (six) hours as needed for wheezing or shortness of breath. ) 18 g 0  . cephALEXin (KEFLEX) 500 MG capsule Take 1 capsule (500 mg total) by mouth 3 (three) times daily. (Patient not taking: Reported on 04/19/2020) 15 capsule 0  . SUPREP BOWEL PREP KIT 17.5-3.13-1.6 GM/177ML SOLN Take by mouth. Will take 04/13/2020 (Patient not taking: Reported on 04/19/2020)     No current facility-administered medications on file prior to visit.    BP 132/74   Pulse 90   Temp (!) 96.8 F (36 C) (Temporal)   Ht $R'5\' 3"'cG$  (1.6 m)   Wt 153 lb (69.4 kg)   SpO2 99%   BMI 27.10 kg/m    Objective:   Physical Exam Constitutional:      Appearance: She is not ill-appearing.  Cardiovascular:     Rate and Rhythm: Normal rate and regular rhythm.  Pulmonary:     Effort: Pulmonary effort is normal.     Breath sounds: Normal breath sounds.  Abdominal:     Tenderness: There is no right CVA tenderness or left CVA tenderness.  Musculoskeletal:     Cervical back: Neck supple.  Skin:    General: Skin is warm and dry.  Neurological:     Mental Status: She is alert.            Assessment & Plan:

## 2020-04-19 NOTE — Addendum Note (Signed)
Addended by: Francella Solian on: 04/19/2020 03:15 PM   Modules accepted: Orders

## 2020-04-19 NOTE — Addendum Note (Signed)
Addended by: Cloyd Stagers on: 04/19/2020 02:55 PM   Modules accepted: Orders

## 2020-04-19 NOTE — Assessment & Plan Note (Signed)
Stable in the office today, but needs to come off olmesartan due to GI side effects. She's failed numerous antihypertensive medications in the past, has a list at home for which she will bring.   Once we have the list of what she cannot tolerate we will proceed with discontinuation of olmesartan. She does not wish to increase dose of Amlodipine due to ankle edema.

## 2020-04-20 LAB — URINE CULTURE
MICRO NUMBER:: 11286717
SPECIMEN QUALITY:: ADEQUATE

## 2020-04-21 ENCOUNTER — Other Ambulatory Visit: Payer: Self-pay | Admitting: Primary Care

## 2020-04-21 DIAGNOSIS — R7303 Prediabetes: Secondary | ICD-10-CM

## 2020-04-23 IMAGING — CT CT ABD-PELV W/ CM
2 of 5 series · 16 of 46 positions shown, 18 images · IV contrast (Omni 300)
Comparison: None.

CLINICAL DATA: Pt reports lower abd pain that began yesterday
morning, states that she had intercourse [REDACTED] night and upon
waking yesterday the pain was so bad she could hardly walk. Denies
bleeding or discharge. Hx of interstitial cystitis

EXAM:
CT ABDOMEN AND PELVIS WITH CONTRAST
TECHNIQUE: Multidetector CT imaging of the abdomen and pelvis was performed
using the standard protocol following bolus administration of
intravenous contrast.
CONTRAST:  100mL OMNIPAQUE IOHEXOL 300 MG/ML  SOLN

[Series 3: a/p w/ 5mm · axial · 0.83mm/px · z∈[+667,+1092]mm · 13 of 95 slices shown, 15 images]
[im 5/95  soft-tissue]
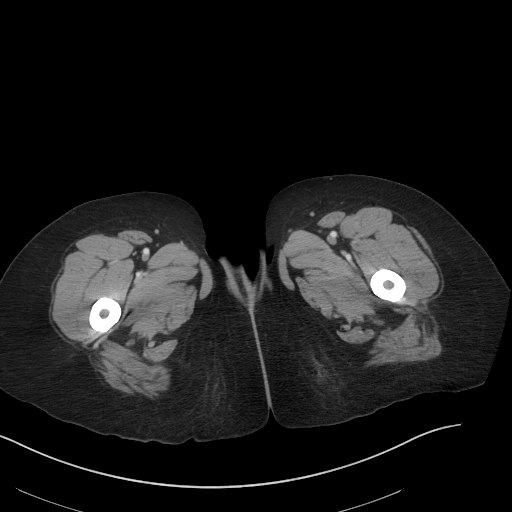
[im 5/95  bone]
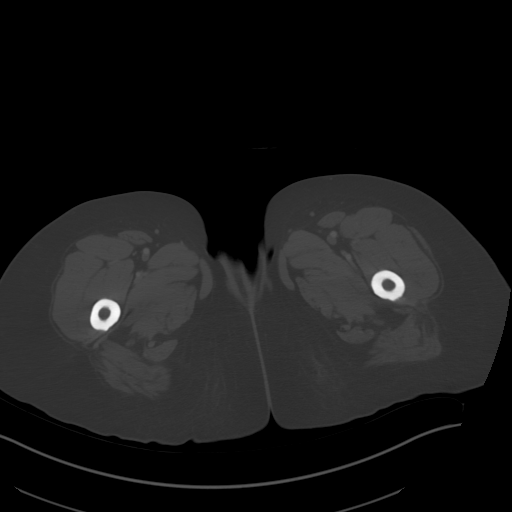
[im 15/95  soft-tissue]
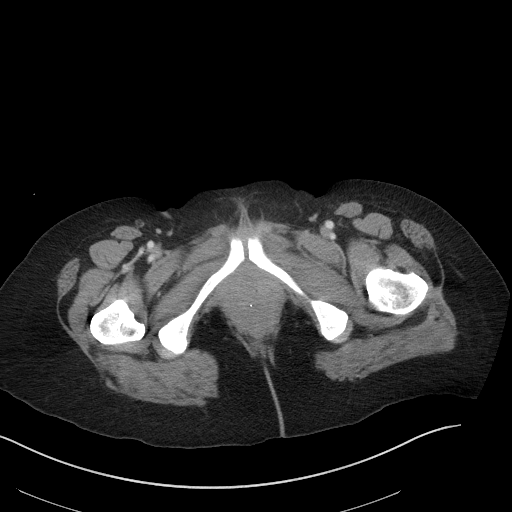
[im 19/95  soft-tissue]
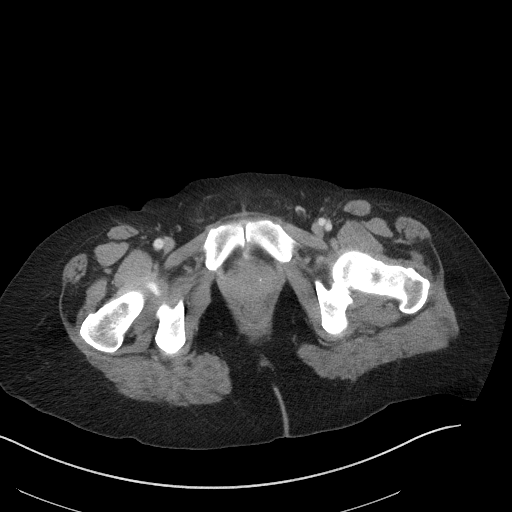
[im 29/95  soft-tissue]
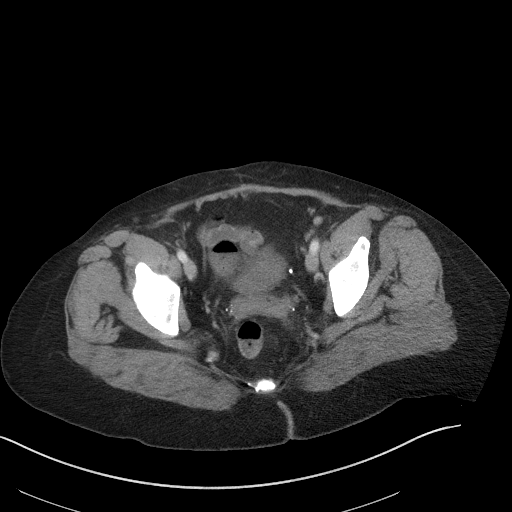
[im 33/95  soft-tissue]
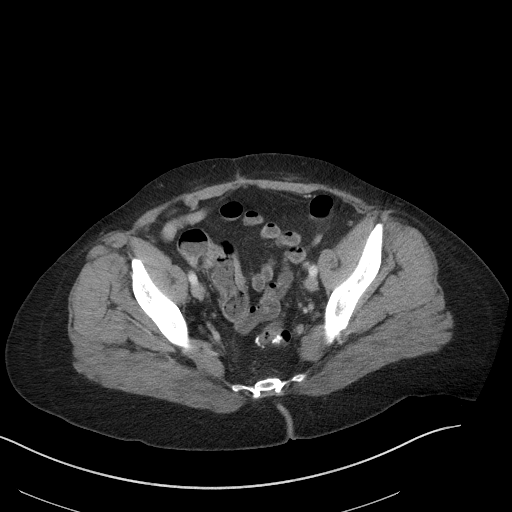
[im 43/95  soft-tissue]
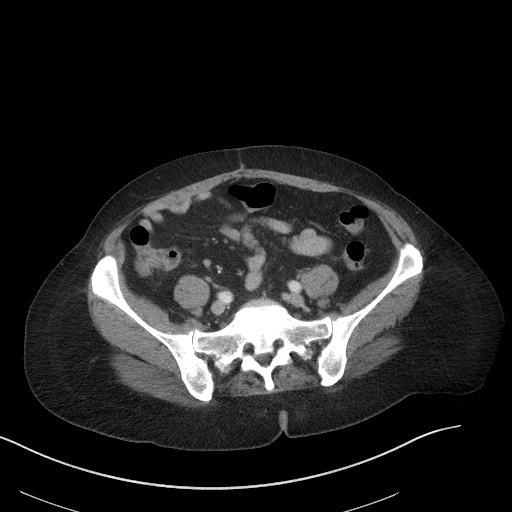
[im 48/95  soft-tissue]
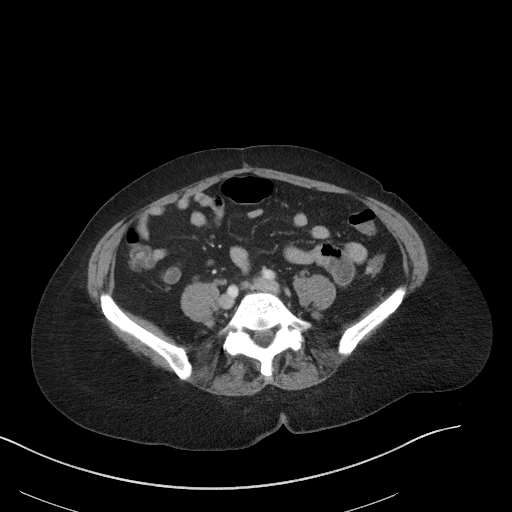
[im 52/95  soft-tissue]
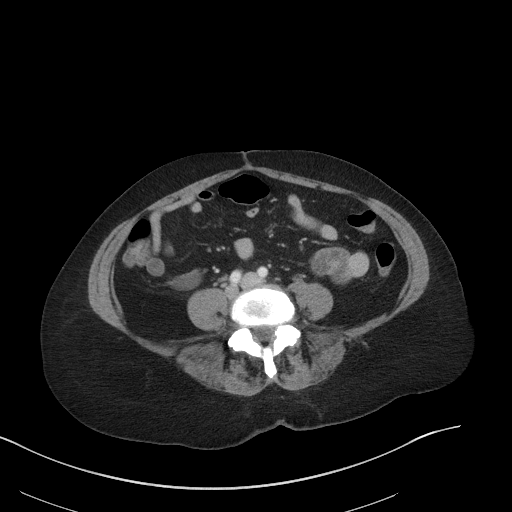
[im 62/95  soft-tissue]
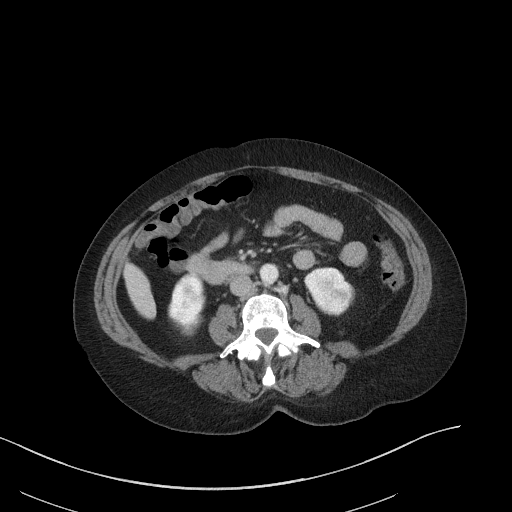
[im 62/95  bone]
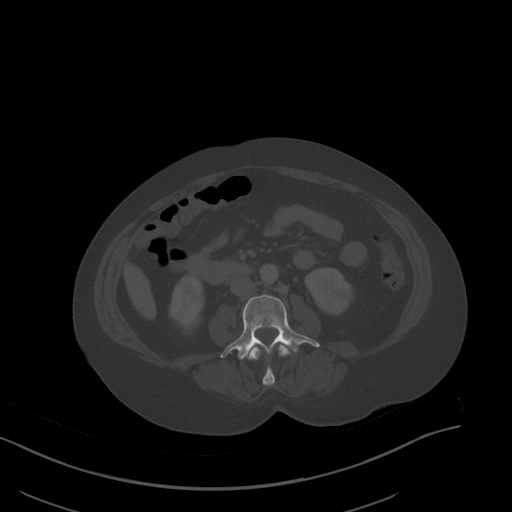
[im 66/95  soft-tissue]
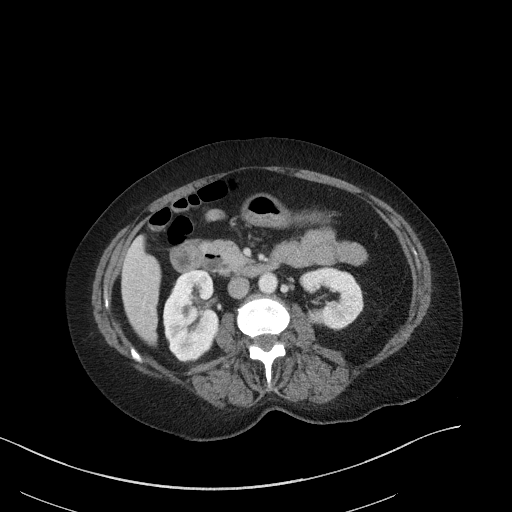
[im 76/95  soft-tissue]
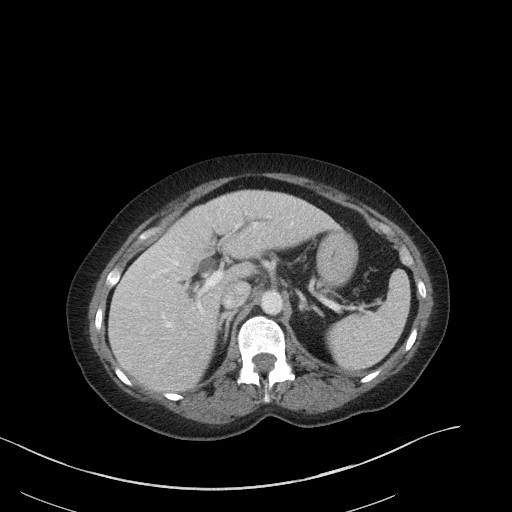
[im 80/95  soft-tissue]
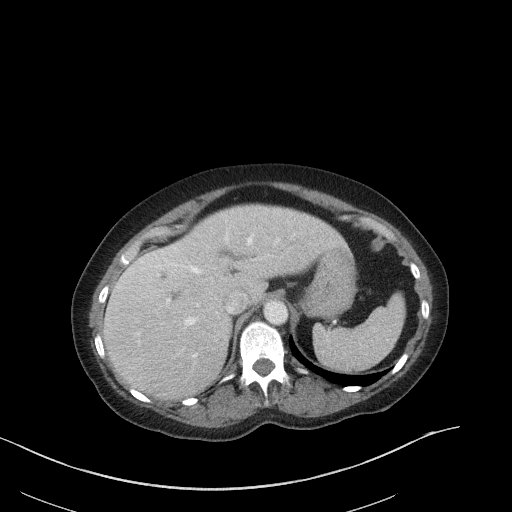
[im 90/95  soft-tissue]
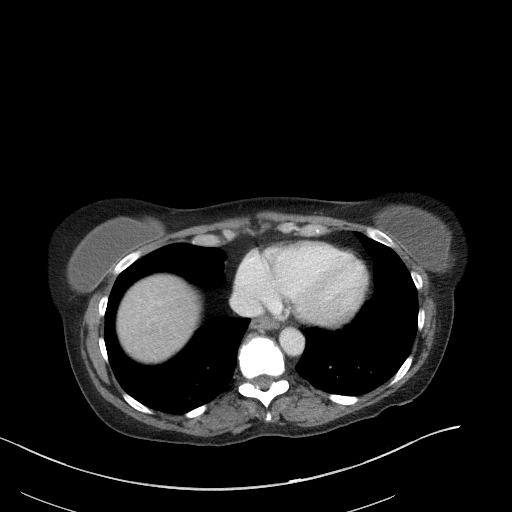

[Series 6: a/p w/ cor · coronal · 0.86mm/px · 3 of 143 slices shown]
[im 48/143  soft-tissue]
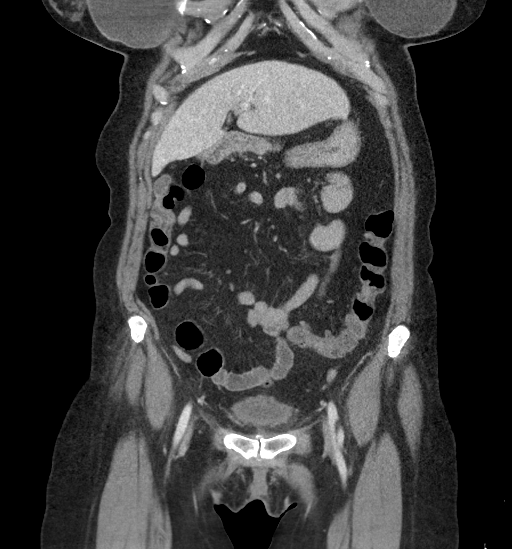
[im 64/143  soft-tissue]
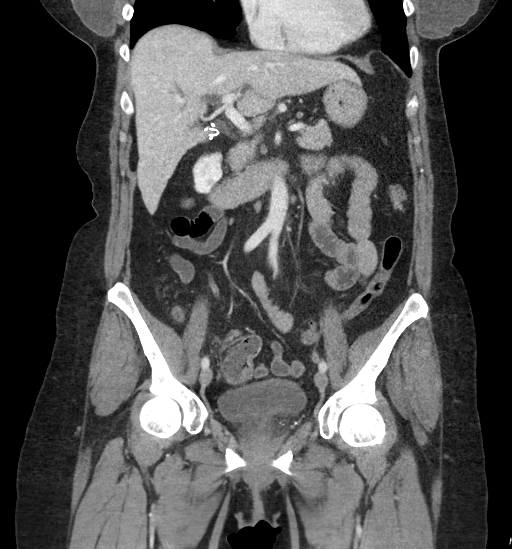
[im 79/143  soft-tissue]
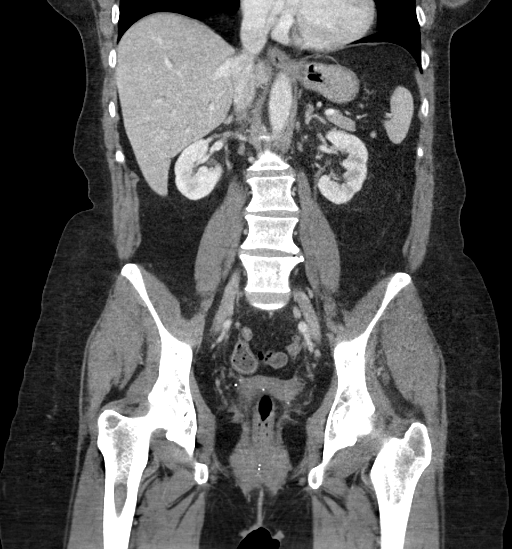

[16 of 46 positions shown; findings below may reference images not displayed]

FINDINGS: Lower chest: Clear lung bases.  Heart normal in size.

Hepatobiliary: Normal liver. Status post cholecystectomy. Intra and
extrahepatic bile duct dilation, common bile duct measuring a
maximum of 11 mm, mildly increased compared to the prior CT.

Pancreas: Mild dilation of the pancreatic duct 3 mm increased from
the prior exam. No pancreatic mass or inflammation.

Spleen: Normal in size without focal abnormality.

Adrenals/Urinary Tract: No adrenal masses.

Kidneys normal in position. There is symmetric renal enhancement and
excretion. No masses or stones. No hydronephrosis. Ureters are
normal in course and in caliber. Bladder is mildly distended.
Bladder is unremarkable

Stomach/Bowel: Normal stomach. Small bowel is normal in caliber. No
wall thickening or inflammation. Colon is normal in caliber with no
wall thickening or inflammation. Low sigmoid colon bowel anastomosis
staple line. There are few left colon diverticula. Normal appendix
visualized.

Vascular/Lymphatic: No significant vascular findings are present. No
enlarged abdominal or pelvic lymph nodes.

Reproductive: Status post hysterectomy. No adnexal masses.

Other: No abdominal wall hernia or abnormality. No abdominopelvic
ascites.

Musculoskeletal: No fracture or acute finding. No osteoblastic or
osteolytic lesions.
IMPRESSION: 1. No acute findings within the abdomen or pelvis. No findings to
account for lower abdominal pain.
2. Intra and extrahepatic bile duct dilation with mild dilation of
the pancreatic duct, without dilation increased from the prior exam,
but presumed chronic in this post cholecystectomy patient.

## 2020-05-03 ENCOUNTER — Other Ambulatory Visit: Payer: Self-pay

## 2020-05-03 ENCOUNTER — Ambulatory Visit (INDEPENDENT_AMBULATORY_CARE_PROVIDER_SITE_OTHER): Payer: 59 | Admitting: Psychiatry

## 2020-05-03 DIAGNOSIS — S069X9S Unspecified intracranial injury with loss of consciousness of unspecified duration, sequela: Secondary | ICD-10-CM

## 2020-05-03 DIAGNOSIS — F0630 Mood disorder due to known physiological condition, unspecified: Secondary | ICD-10-CM | POA: Diagnosis not present

## 2020-05-03 DIAGNOSIS — S069XAS Unspecified intracranial injury with loss of consciousness status unknown, sequela: Secondary | ICD-10-CM
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
## 2020-05-09 ENCOUNTER — Other Ambulatory Visit (HOSPITAL_COMMUNITY): Payer: Self-pay | Admitting: Psychiatry

## 2020-05-16 ENCOUNTER — Ambulatory Visit: Payer: 59 | Admitting: Gastroenterology

## 2020-05-16 DIAGNOSIS — R7303 Prediabetes: Secondary | ICD-10-CM

## 2020-05-16 DIAGNOSIS — R5382 Chronic fatigue, unspecified: Secondary | ICD-10-CM

## 2020-05-24 ENCOUNTER — Other Ambulatory Visit: Payer: Self-pay

## 2020-05-24 ENCOUNTER — Ambulatory Visit (INDEPENDENT_AMBULATORY_CARE_PROVIDER_SITE_OTHER): Payer: 59 | Admitting: Psychiatry

## 2020-05-24 DIAGNOSIS — F0630 Mood disorder due to known physiological condition, unspecified: Secondary | ICD-10-CM

## 2020-05-24 DIAGNOSIS — S069X9S Unspecified intracranial injury with loss of consciousness of unspecified duration, sequela: Secondary | ICD-10-CM

## 2020-05-24 DIAGNOSIS — S069XAS Unspecified intracranial injury with loss of consciousness status unknown, sequela: Secondary | ICD-10-CM
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
## 2020-06-07 ENCOUNTER — Other Ambulatory Visit: Payer: Self-pay

## 2020-06-07 ENCOUNTER — Ambulatory Visit (INDEPENDENT_AMBULATORY_CARE_PROVIDER_SITE_OTHER): Payer: 59 | Admitting: Psychiatry

## 2020-06-07 ENCOUNTER — Other Ambulatory Visit: Payer: Self-pay | Admitting: Primary Care

## 2020-06-07 ENCOUNTER — Other Ambulatory Visit (HOSPITAL_COMMUNITY): Payer: Self-pay | Admitting: Psychiatry

## 2020-06-07 DIAGNOSIS — S069XAS Unspecified intracranial injury with loss of consciousness status unknown, sequela: Secondary | ICD-10-CM

## 2020-06-07 DIAGNOSIS — S069X9S Unspecified intracranial injury with loss of consciousness of unspecified duration, sequela: Secondary | ICD-10-CM

## 2020-06-07 DIAGNOSIS — F0630 Mood disorder due to known physiological condition, unspecified: Secondary | ICD-10-CM | POA: Diagnosis not present

## 2020-06-07 DIAGNOSIS — E039 Hypothyroidism, unspecified: Secondary | ICD-10-CM
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
## 2020-06-08 ENCOUNTER — Other Ambulatory Visit: Payer: Self-pay

## 2020-06-08 ENCOUNTER — Encounter: Payer: Self-pay | Admitting: Gastroenterology

## 2020-06-08 ENCOUNTER — Ambulatory Visit (INDEPENDENT_AMBULATORY_CARE_PROVIDER_SITE_OTHER): Payer: 59 | Admitting: Gastroenterology

## 2020-06-08 VITALS — BP 157/88 | HR 98 | Temp 97.7°F | Ht 63.0 in | Wt 158.1 lb

## 2020-06-08 DIAGNOSIS — R1319 Other dysphagia: Secondary | ICD-10-CM | POA: Diagnosis not present
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
## 2020-06-09 ENCOUNTER — Telehealth (INDEPENDENT_AMBULATORY_CARE_PROVIDER_SITE_OTHER): Payer: 59 | Admitting: Psychiatry

## 2020-06-09 DIAGNOSIS — S069X9S Unspecified intracranial injury with loss of consciousness of unspecified duration, sequela: Secondary | ICD-10-CM

## 2020-06-09 DIAGNOSIS — F0630 Mood disorder due to known physiological condition, unspecified: Secondary | ICD-10-CM

## 2020-06-09 DIAGNOSIS — F410 Panic disorder [episodic paroxysmal anxiety] without agoraphobia: Secondary | ICD-10-CM | POA: Diagnosis not present

## 2020-06-09 DIAGNOSIS — S069XAS Unspecified intracranial injury with loss of consciousness status unknown, sequela: Secondary | ICD-10-CM

## 2020-06-09 MED ORDER — AMPHETAMINE-DEXTROAMPHETAMINE 20 MG PO TABS: 20.0000 mg | ORAL_TABLET | Freq: Three times a day (TID) | ORAL | 0 refills | Status: DC

## 2020-06-09 MED ORDER — ZOLPIDEM TARTRATE 5 MG PO TABS: 5.0000 mg | ORAL_TABLET | Freq: Every evening | ORAL | 2 refills | Status: DC | PRN

## 2020-06-09 MED ORDER — SERTRALINE HCL 100 MG PO TABS: 100.0000 mg | ORAL_TABLET | Freq: Every day | ORAL | 2 refills | Status: DC
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
## 2020-06-10 ENCOUNTER — Telehealth: Payer: Self-pay

## 2020-06-10 NOTE — Telephone Encounter (Signed)
[facility] - Client TELEPHONE ADVICE RECORD [facility] Patient Name: Katie Robbins Gender: Female DOB: [birth year] Age: 65 Y 51 M 35 D Return Phone Number: [phone] (Primary), [phone] (Secondary), [phone] (Alternate) Address[place] [address] Client [facility] - Client Client Site [place] - Day Physician [name] - NP Contact Type Call Who Is Calling Patient / Member / Family / Caregiver Call Type Triage / Clinical Relationship To Patient Self Return Phone Number [phone] (Primary) Chief Complaint BREATHING - shortness of breath or sounds breathless Reason for Call Symptomatic / Request for [place] states has tingling hands and feet. Slurry talk and out of breath. Translation No Nurse Assessment Nurse: Ronnald Ramp, RN, Miranda Date/Time (Eastern Time): 06/10/2020 2:35:47 PM Confirm and document reason for call. If symptomatic, describe symptoms. ---[name] states she is having SOB. She is having trouble walking, her arms and legs are weak. She was seen for this about 1 year ago. She has been having symptoms for 2 months. Albuterol 3 times a day, last dose yesterday. Does the patient have any new or worsening symptoms? ---Yes Will a triage be completed? ---Yes Related visit to physician within the last 2 weeks? ---No Does the PT have any chronic conditions? (i.e. diabetes, asthma, this includes High risk factors for pregnancy, etc.) ---Yes List chronic conditions. ---Asthma, Thyroid, HTN, Depression, GERD Is this a behavioral health or substance abuse call? ---No Guidelines Guideline Title Affirmed Question Affirmed Notes Nurse Date/Time (Eastern Time) Asthma Attack [1] MILD asthma attack (e.g., no SOB at rest, mild SOB with walking, speaks normally in sentences, mild wheezing) AND [2] lasting > 24 hours on  appropriate treatment Ronnald Ramp, RN, Jeannetta Nap 06/10/2020 2:45:08 PM Neurologic Deficit [1] Weakness of arm / hand, or leg / foot AND [2] is a chronic symptom Ronnald Ramp RN, Miranda 06/10/2020 3:06:53 PM PLEASE NOTE: All timestamps contained within this report are represented as [place] Standard Time. CONFIDENTIALTY NOTICE: This fax transmission is intended only for the addressee. It contains information that is legally privileged, confidential or otherwise protected from use or disclosure. If you are not the intended recipient, you are strictly prohibited from reviewing, disclosing, copying using or disseminating any of this information or taking any action in reliance on or regarding this information. If you have received this fax in error, please notify us immediately by telephone so that we can arrange for its return to [place]. Phone: [phone], Toll-Free: [phone], Fax: [phone] Page: 2 of 2 Call Id: [id number] Guidelines Guideline Title Affirmed Question Affirmed Notes Nurse Date/Time Eilene Ghazi Time) (recurrent or ongoing AND present > 4 weeks) Disp. Time Eilene Ghazi Time) Disposition Final User 06/10/2020 2:35:07 PM Send to Urgent Queue Margaretmary Bayley 06/10/2020 3:10:27 PM SEE PCP WITHIN 3 DAYS Ronnald Ramp RN, Miranda 06/10/2020 3:05:25 PM See PCP within 24 Hours Yes Ronnald Ramp, RN, Judge Stall Disagree/Comply Comply [name] Understands Yes PreDisposition Call Doctor Care Advice Given Per Guideline SEE PCP WITHIN 24 HOURS: * IF OFFICE WILL BE OPEN: You need to be examined within the next 24 hours. Call your doctor (or NP/PA) when the office opens and make an appointment. ASTHMA ATTACK - TREATMENT - QUICK-RELIEF MEDICINE: * Start your quick-relief medicine (e.g., albuterol, salbutamol) at the first sign of any coughing or shortness of breath (don't wait for wheezing). * Use inhaler (2 puffs each time) or your nebulizer every 4 hours. * Continue the quick-relief asthma medicine until you have not  wheezed or coughed  for 48 hours. It takes a minimum of 7 days of medicine for lung function to return to normal. * If you are using a controller (long-term control) medicine, such as an INHALED STEROID, continue to take it as directed by your doctor (or NP/PA). ASTHMA ATTACK - TREATMENT - CONTROLLER MEDICINE: * Most people with asthma should be using an inhaled costeroid as a controller medicine every day. CARE ADVICE given per Asthma Attack (Adult) guideline. * Note: If available, use a spacer (holding chamber) with your inhaler. This helps you get the medicine deep into your lungs to help your breathing. DRINK PLENTY OF LIQUIDS AND USE A HUMIDIFIER: * Drink plenty of liquids. It is important to stay wellhydrated. * A healthy adult should drink 8 cups (240 ml) or more of liquid each day. CALL BACK IF: * Moderate asthma attack (audible wheezes, breathing difficulty) and not better 2 inhaler or nebulizer treatments given 20 minutes apart * You become worse * Quick-relief asthma medicine (such as albuterol by inhaler or nebulizer) is needed more often than every 4 hours SEE PCP WITHIN 3 DAYS: * You need to be seen within 2 or 3 days. CALL BACK IF: * You become worse CARE ADVICE given per Neurologic Deficit (Adult) guideline. * Problems with bowel or bladder control * Constant numbness or weakness in arms or legs Comments User: Katie Humbles, RN Date/Time Eilene Ghazi Time): 06/10/2020 3:06:05 PM NO appt available at office User: Katie Humbles, RN Date/Time ([place] Time): 06/10/2020 3:09:17 PM Her neurosurgeon said he believes it is Guillain-Barre Syndrome but the neurologist did not. Referrals REFERRED TO PCP OFFICE

## 2020-06-10 NOTE — Telephone Encounter (Signed)
I spoke with pt; pt said she already has appt with pulmonologist on 06/13/20 at 1:45. If pt has problems over weekend she will go to [facility] ED if needed. Pt was very happy to have appt with pulmonology on 06/13/20. Sending note to Gentry Fitz NP and [facility] CMA.

## 2020-06-12 NOTE — Telephone Encounter (Signed)
Noted, she follows with Neurology and Pulmonology. Recommend she follow through with appointment scheduled for 06/13/20 (tomorrow). Also will evaluate patient later this week as scheduled.

## 2020-06-13 ENCOUNTER — Encounter: Payer: Self-pay | Admitting: Emergency Medicine

## 2020-06-13 ENCOUNTER — Ambulatory Visit (INDEPENDENT_AMBULATORY_CARE_PROVIDER_SITE_OTHER): Payer: 59

## 2020-06-13 ENCOUNTER — Ambulatory Visit: Payer: 59 | Admitting: Emergency Medicine

## 2020-06-13 ENCOUNTER — Other Ambulatory Visit: Payer: Self-pay

## 2020-06-13 VITALS — BP 132/74 | HR 89 | Temp 97.2°F | Ht 63.0 in | Wt 158.0 lb

## 2020-06-13 DIAGNOSIS — R0602 Shortness of breath: Secondary | ICD-10-CM

## 2020-06-13 DIAGNOSIS — R0789 Other chest pain: Secondary | ICD-10-CM | POA: Insufficient documentation

## 2020-06-13 HISTORY — DX: Other chest pain: R07.89

## 2020-06-13 NOTE — Progress Notes (Signed)
Subjective:    Patient ID: Katie Robbins, female    DOB: November 02, 1955, 65 y.o.   MRN: [id number]  HPI  ROV 06/03/20 --65 year old woman, never smoker who follows up today for history of GERD with nutcracker esophagus, hypothyroidism, asthma.  Also deals with some chest discomfort and dysphagia from her esophageal disease.  Pulmonary function testing showed mild obstruction with a positive bronchodilator response and severe hyperinflation.  She has been treated with Symbicort.  Has albuterol which she uses approximately She reports today that she has been having increased exertional SOB since about August 2021, has been progressive, now has to stop to rest with just walking through the house. Began to be associated with mid sternal pain in the fall 2021. Can experience wheeze, not always associated with exertion but often is.  She is on Protonix 40 mg now on qd from bid.  She recently underwent repeat esophageal dilation (has had numerous times) in December with belief that it may help - temporarily helped her dysphagia but has not improved her breathing.   Ultrasound chest 04/06/2020 shows a 6.5 x 6 x 2 cm subcutaneous lipoma  Review of Systems  Constitutional: Negative for fever and unexpected weight change.  HENT: Negative for congestion, dental problem, ear pain, nosebleeds, postnasal drip, rhinorrhea, sinus pressure, sneezing, sore throat and trouble swallowing.   Eyes: Negative for redness and itching.  Respiratory: Positive for cough, chest tightness and shortness of breath. Negative for wheezing.   Cardiovascular: Negative for palpitations and leg swelling.  Gastrointestinal: Negative for nausea and vomiting.  Genitourinary: Negative for dysuria.  Musculoskeletal: Negative for joint swelling.  Skin: Negative for rash.  Neurological: Negative for headaches.  Hematological: Does not bruise/bleed easily.  Psychiatric/Behavioral: Negative for dysphoric mood. The patient is not  nervous/anxious.     Past Medical History:  Diagnosis Date  . Anxiety   . Arthritis   . Asthma   . Chicken pox   . Chronic headaches   . Diverticulitis   . Essential hypertension   . GERD (gastroesophageal reflux disease)   . Guillain Barr syndrome ([place]) 1982  . H/O breast implant   . Heart murmur   . Hypertension   . Hypothyroidism   . Interstitial cystitis   . Interstitial cystitis   . Mitral prolapse 1985  . Nutcracker esophagus      Family History  Problem Relation Age of Onset  . Hypertension Mother   . GER disease Mother   . Polymyalgia rheumatica Mother   . COPD Mother   . Osteoarthritis Mother   . Osteoporosis Mother   . Other Mother        Giant Cell Arteritis   . GER disease Father   . Heart attack Father      Social History   Socioeconomic History  . Marital status: Married    Spouse name: [name]  . Number of children: 2  . Years of education: Some college  . Highest education level: Not on file  Occupational History  . Occupation: Retired  Tobacco Use  . Smoking status: Never Smoker  . Smokeless tobacco: Never Used  Vaping Use  . Vaping Use: Never used  Substance and Sexual Activity  . Alcohol use: Yes    Comment: socially  . Drug use: No  . Sexual activity: Yes    Birth control/protection: Surgical  Other Topics Concern  . Not on file  Social History Narrative   Lives with husband   Caffeine use: rare  Right handed   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     Social Determinants of Health   Financial Resource Strain: Not on file  Food Insecurity: Not on file  Transportation Needs: Not on file  Physical Activity: Not on file  Stress: Not on file  Social Connections: Not on file  Intimate Partner Violence: Not on file  She was in the [place]; she was in [place] She has lived in [place], [place], [place], [place] Formerly lived in a home with black mold.    Allergies  Allergen Reactions  . Bactrim [Sulfamethoxazole-Trimethoprim] Hives  . Macrobid [Nitrofurantoin  Macrocrystal] Hives  . Tizanidine Other (See Comments)    Bad Mood changs  . Vicodin [Hydrocodone-Acetaminophen] Swelling    Puffy eyes and face  . Trileptal [Oxcarbazepine] Rash  . Hydrocodone   . Nexium [Esomeprazole Magnesium]     Face swelling, hives   . Prilosec [Omeprazole]     Face swelling, hives   . Sulfa Antibiotics   . Wasp Venom Hives    swelling  . Erythromycin Rash     Outpatient Medications Prior to Visit  Medication Sig Dispense Refill  . amLODipine (NORVASC) 5 MG tablet TAKE 1 TABLET BY MOUTH ONCE DAILY FOR BLOOD PRESSURE 90 tablet 3  . amphetamine-dextroamphetamine (ADDERALL) 20 MG tablet Take 1 tablet (20 mg total) by mouth in the morning, at noon, and at bedtime. 90 tablet 0  . [START ON 07/09/2020] amphetamine-dextroamphetamine (ADDERALL) 20 MG tablet Take 1 tablet (20 mg total) by mouth in the morning, at noon, and at bedtime. 90 tablet 0  . [START ON 08/08/2020] amphetamine-dextroamphetamine (ADDERALL) 20 MG tablet Take 1 tablet (20 mg total) by mouth in the morning, at noon, and at bedtime. 90 tablet 0  . cyanocobalamin (,VITAMIN B-12,) 1000 MCG/ML injection INJECT 1 VIAL (1 ML) INTO THE MUSCLE EVERY 3 TO 4 WEEKS (Patient taking differently: Inject 1,000 mcg into the muscle See admin instructions. Every 3 to 4 weeks) 24 mL 1  . diazepam (VALIUM) 5 MG tablet Take 1 tablet (5 mg total) by mouth 3 (three) times daily as needed for anxiety. 90 tablet 2  . EPINEPHrine 0.3 mg/0.3 mL IJ SOAJ injection Inject 0.3 mLs (0.3 mg total) into the muscle as needed for anaphylaxis. 1 each 0  . estradiol (ESTRACE) 1 MG tablet Take 1 mg by mouth daily.    Marland Kitchen levothyroxine (SYNTHROID) 100 MCG tablet TAKE 1 TABLET BY MOUTH ON Sunday, Monday, Tuesday, Wednesday on AN EMPTY STOMACH WITH WATER ONLY. Take at least 30-60 MIN BEFORE BREAKFAST. 48 tablet 1  . levothyroxine (SYNTHROID) 112 MCG tablet TAKE 1 TABLET BY MOUTH ON THURSDAY,FRIDAY AND SATURDAY. TAKE ON EMPTY STOMACH WITH WATER ONLY NO  FOOD OROTHER MEDS FOR 30 MINS 36 tablet 1  . magic mouthwash SOLN Take 15 mLs by mouth 3 (three) times daily as needed for mouth pain (Modified Magic Mouthwash). 360 mL 0  . olmesartan (BENICAR) 20 MG tablet Take 1 tablet (20 mg total) by mouth 2 (two) times daily. 180 tablet 3  . pantoprazole (PROTONIX) 40 MG tablet TAKE 1 TABLET BY MOUTH TWICE DAILY 180 tablet 3  . [START ON 07/10/2020] sertraline (ZOLOFT) 100 MG tablet Take 1 tablet (100 mg total) by mouth daily. Medication may be crushed. 30 tablet 2  . SYMBICORT 160-4.5 MCG/ACT inhaler INHALE 2 PUFFS INTO THE LUNGS TWICE DAILY (Patient taking differently: Inhale 2 puffs into the lungs in the morning and at bedtime.) 10.2 g 5  . VENTOLIN HFA 108 (90  Base) MCG/ACT inhaler INHALE 2 PUFFS INTO THE LUNGS EVERY 6 HOURS AS NEEDED FOR WHEEZING OR SHORTNESS OF BREATH. (Patient taking differently: Inhale 2 puffs into the lungs every 6 (six) hours as needed for wheezing or shortness of breath.) 18 g 0  . zolpidem (AMBIEN) 5 MG tablet Take 1 tablet (5 mg total) by mouth at bedtime as needed for sleep. 30 tablet 2   No facility-administered medications prior to visit.        Objective:   Physical Exam Vitals:   06/13/20 1356  BP: 132/74  Pulse: 89  Temp: (!) 97.2 F (36.2 C)  SpO2: 100%  Weight: 158 lb (71.7 kg)  Height: 5\' 3"  (1.6 m)   Gen: Pleasant, well-nourished, in no distress, scattered affect  ENT: No lesions,  mouth clear,  oropharynx clear, no postnasal drip  Neck: No JVD, no stridor  Lungs: No use of accessory muscles, no wheeze or crackles  Cardiovascular: RRR, heart sounds normal, no murmur or gallops, no peripheral edema  Musculoskeletal: Significant tenderness to palpation mid and lower sternum, reproduces her discomfort  Neuro: alert, non focal  Skin: Warm, no lesions or rash      Assessment & Plan:  Non-cardiac chest pain This is her biggest complaint.  Is difficult to tell whether this stems from her is the  cause of her dyspnea.  I think it is probably the latter.  She has significant tenderness on palpation of her sternum, consistent with musculoskeletal chest pain or costochondritis.  I do not think this represents bronchospasm.  Question component of GERD especially given her history of nutcracker esophagus.  GI evaluation is underway and she is on a PPI daily  Dyspnea Multifactorial.  She does have a history of asthma with mild obstruction but her dyspnea appears to be far out of proportion to those PFT and is associated with chest pain that appears to be musculoskeletal.  I think for now she should continue Symbicort.  We will perform a chest x-ray today, repeat her pulmonary function testing to look for interval change.  Baltazar Apo, MD, PhD 06/13/2020, 5:09 PM [facility] [phone] or if no answer [phone]

## 2020-06-13 NOTE — Assessment & Plan Note (Signed)
This is her biggest complaint.  Is difficult to tell whether this stems from her is the cause of her dyspnea.  I think it is probably the latter.  She has significant tenderness on palpation of her sternum, consistent with musculoskeletal chest pain or costochondritis.  I do not think this represents bronchospasm.  Question component of GERD especially given her history of nutcracker esophagus.  GI evaluation is underway and she is on a PPI daily

## 2020-06-13 NOTE — Patient Instructions (Signed)
Chest x-ray today We will repeat your pulmonary function testing at your next office visit Continue Symbicort 2 puffs twice a day for now.  Rinse and gargle after using. Keep your albuterol available to use 2 puffs when you need a short of breath, chest tightness, wheezing. Continue your pantoprazole as you are taking it.  Could consider increasing to twice a day depending on how your chest discomfort progresses.  Discuss with your gastroenterologist. Follow with Dr. Lamonte Sakai next available with full pulmonary function testing on the same day.

## 2020-06-13 NOTE — Assessment & Plan Note (Signed)
Multifactorial.  She does have a history of asthma with mild obstruction but her dyspnea appears to be far out of proportion to those PFT and is associated with chest pain that appears to be musculoskeletal.  I think for now she should continue Symbicort.  We will perform a chest x-ray today, repeat her pulmonary function testing to look for interval change.

## 2020-06-14 ENCOUNTER — Ambulatory Visit: Payer: 59 | Admitting: Primary Care

## 2020-06-14 ENCOUNTER — Encounter: Payer: Self-pay | Admitting: Primary Care

## 2020-06-14 VITALS — BP 124/76 | HR 71 | Temp 97.9°F | Ht 63.0 in | Wt 159.8 lb

## 2020-06-14 DIAGNOSIS — E039 Hypothyroidism, unspecified: Secondary | ICD-10-CM | POA: Diagnosis not present

## 2020-06-14 DIAGNOSIS — R200 Anesthesia of skin: Secondary | ICD-10-CM | POA: Diagnosis not present

## 2020-06-14 DIAGNOSIS — R1319 Other dysphagia: Secondary | ICD-10-CM | POA: Diagnosis not present

## 2020-06-14 DIAGNOSIS — R29898 Other symptoms and signs involving the musculoskeletal system: Secondary | ICD-10-CM | POA: Diagnosis not present

## 2020-06-14 DIAGNOSIS — J4540 Moderate persistent asthma, uncomplicated: Secondary | ICD-10-CM | POA: Diagnosis not present

## 2020-06-14 DIAGNOSIS — R202 Paresthesia of skin: Secondary | ICD-10-CM

## 2020-06-14 DIAGNOSIS — E538 Deficiency of other specified B group vitamins: Secondary | ICD-10-CM

## 2020-06-14 LAB — BASIC METABOLIC PANEL
BUN: 20 mg/dL (ref 6–23)
CO2: 27 mEq/L (ref 19–32)
Calcium: 9.3 mg/dL (ref 8.4–10.5)
Chloride: 99 mEq/L (ref 96–112)
Creatinine, Ser: 0.99 mg/dL (ref 0.40–1.20)
GFR: 60.18 mL/min (ref >60.00)
Glucose, Bld: 100 mg/dL — ABNORMAL HIGH (ref 70–99)
Potassium: 4.9 mEq/L (ref 3.5–5.1)
Sodium: 136 mEq/L (ref 135–145)

## 2020-06-14 LAB — TSH: TSH: 0.78 u[IU]/mL (ref 0.35–4.50)

## 2020-06-14 LAB — MONONUCLEOSIS SCREEN: Mono Screen: NEGATIVE

## 2020-06-14 LAB — VITAMIN B12: Vitamin B-12: 744 pg/mL (ref 211–911)

## 2020-06-14 NOTE — Assessment & Plan Note (Signed)
Compliant to vitamin B 12 injections every 2 weeks, repeat B12 level pending.

## 2020-06-14 NOTE — Assessment & Plan Note (Signed)
Now following with pulmonology, recent chest xray negative. She will undergo repeat PFT's. Continue Symbicort.

## 2020-06-14 NOTE — Assessment & Plan Note (Signed)
Acute episode occurring two evenings ago, very suspicious for neurological disorder, especially given history of Gulliean Barre and recent MRI results.  Referral placed to neurology.

## 2020-06-14 NOTE — Patient Instructions (Signed)
Stop by the lab prior to leaving today. I will notify you of your results once received.   You will be contacted regarding your referral to Neurology.  Please let us know if you have not been contacted within two weeks.   It was a pleasure to see you today!

## 2020-06-14 NOTE — Progress Notes (Addendum)
Subjective:    Patient ID: Katie Robbins, female    DOB: April 16, 1956, 65 y.o.   MRN: [id number]  HPI  This visit occurred during the SARS-CoV-2 public health emergency.  Safety protocols were in place, including screening questions prior to the visit, additional usage of staff PPE, and extensive cleaning of exam room while observing appropriate contact time as indicated for disinfecting solutions.   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  Currently following with neurosurgery and psychiatry who is requesting she be referred to neurology due to findings from MRI completed in October 2021. Results of MRI brain showed "chronic microvascular ischemic changes, more conspicuous than prior exam". Given these findings I recommended she speak with her neurosurgeon regarding results.  She would like to be referred to Dr. Jaynee Eagles or Dr. Felecia Shelling in [place].   Two evenings ago she felt a sudden onset of lower extremity numbness that began to her feet and radiated through her bilateral legs up to the groin. This  episode lasted for about one hour. She was diagnosed with Martyn Ehrich years ago when she worked for [employer], was hospitalized for several weeks at the time. She does have chronic numbness to her plantar feet.   She is receiving injections for B12 every two weeks, no recent B12 level on file.   Recently evaluated by pulmonology yesterday, underwent chest xray which was negative. Plan is to continue Symbicort and repeat PFT's. She has this scheduled.   Review of Systems  Genitourinary:       Denies sudden loss of bowel/bladder control..  Neurological: Positive for weakness and numbness.       Past Medical History:   Diagnosis Date  . Anxiety   . Arthritis   . Asthma   . Chicken pox   . Chronic headaches   . Diverticulitis   . Essential hypertension   . GERD (gastroesophageal reflux disease)   . Guillain Barr syndrome ([place]) 1982  . H/O breast implant   . Heart murmur   . Hypertension   . Hypothyroidism   . Interstitial cystitis   . Interstitial cystitis   . Mitral prolapse 1985  . Nutcracker esophagus      Social History   Socioeconomic History  . Marital status: Married    Spouse name: [name]  . Number of children: 2  . Years of education: Some college  . Highest education level: Not on file  Occupational History  . Occupation: Retired  Tobacco Use  . Smoking status: Never Smoker  . Smokeless tobacco: Never Used  Vaping Use  . Vaping Use: Never used  Substance and Sexual Activity  . Alcohol use: Yes    Comment: socially  . Drug use: No  . Sexual activity: Yes    Birth control/protection: Surgical  Other Topics Concern  . 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     Social Determinants of Health   Financial Resource Strain: Not on file  Food Insecurity: Not on file  Transportation Needs: Not on file  Physical Activity: Not on file  Stress: Not on file  Social Connections: Not on file  Intimate Partner Violence: Not on file  Past Surgical History:  Procedure Laterality Date  . ABDOMINAL HYSTERECTOMY  2000/2009  . APPENDECTOMY  2007  . BOWEL RESECTION  2007  . BREAST SURGERY    . CARDIAC CATHETERIZATION  2016   Clean  . CARPAL TUNNEL RELEASE Right   . CHOLECYSTECTOMY  2012  . CHOLECYSTECTOMY  2012  . COLONOSCOPY WITH PROPOFOL N/A 04/13/2020   Procedure: COLONOSCOPY WITH PROPOFOL;  Surgeon: Lin Landsman, MD;  Location[facility] ENDOSCOPY;  Service: Gastroenterology;  Laterality: N/A;  . ESOPHAGOGASTRODUODENOSCOPY (EGD) WITH PROPOFOL N/A 04/13/2020   Procedure:  ESOPHAGOGASTRODUODENOSCOPY (EGD) WITH PROPOFOL;  Surgeon: Lin Landsman, MD;  Location: [facility] ENDOSCOPY;  Service: Gastroenterology;  Laterality: N/A;  . NECK SURGERY  06/11/2016   plates and screws in neck   . SMALL INTESTINE SURGERY  2007   Bowel resection  . TONSILLECTOMY    . VESICO-VAGINAL FISTULA REPAIR  2007    Family History  Problem Relation Age of Onset  . Hypertension Mother   . GER disease Mother   . Polymyalgia rheumatica Mother   . COPD Mother   . Osteoarthritis Mother   . Osteoporosis Mother   . Other Mother        Giant Cell Arteritis   . GER disease Father   . Heart attack Father     Allergies  Allergen Reactions  . Bactrim [Sulfamethoxazole-Trimethoprim] Hives  . Macrobid [Nitrofurantoin Macrocrystal] Hives  . Tizanidine Other (See Comments)    Bad Mood changs  . Vicodin [Hydrocodone-Acetaminophen] Swelling    Puffy eyes and face  . Trileptal [Oxcarbazepine] Rash  . Hydrocodone   . Nexium [Esomeprazole Magnesium]     Face swelling, hives   . Prilosec [Omeprazole]     Face swelling, hives   . Sulfa Antibiotics   . Wasp Venom Hives    swelling  . Erythromycin Rash    Current Outpatient Medications on File Prior to Visit  Medication Sig Dispense Refill  . amLODipine (NORVASC) 5 MG tablet TAKE 1 TABLET BY MOUTH ONCE DAILY FOR BLOOD PRESSURE 90 tablet 3  . amphetamine-dextroamphetamine (ADDERALL) 20 MG tablet Take 1 tablet (20 mg total) by mouth in the morning, at noon, and at bedtime. 90 tablet 0  . [START ON 07/09/2020] amphetamine-dextroamphetamine (ADDERALL) 20 MG tablet Take 1 tablet (20 mg total) by mouth in the morning, at noon, and at bedtime. 90 tablet 0  . [START ON 08/08/2020] amphetamine-dextroamphetamine (ADDERALL) 20 MG tablet Take 1 tablet (20 mg total) by mouth in the morning, at noon, and at bedtime. 90 tablet 0  . cyanocobalamin (,VITAMIN B-12,) 1000 MCG/ML injection INJECT 1 VIAL (1 ML) INTO THE MUSCLE EVERY 3 TO 4 WEEKS (Patient  taking differently: Inject 1,000 mcg into the muscle See admin instructions. Every 3 to 4 weeks) 24 mL 1  . diazepam (VALIUM) 5 MG tablet Take 1 tablet (5 mg total) by mouth 3 (three) times daily as needed for anxiety. 90 tablet 2  . EPINEPHrine 0.3 mg/0.3 mL IJ SOAJ injection Inject 0.3 mLs (0.3 mg total) into the muscle as needed for anaphylaxis. 1 each 0  . estradiol (ESTRACE) 1 MG tablet Take 1 mg by mouth daily.    Marland Kitchen levothyroxine (SYNTHROID) 100 MCG tablet TAKE 1 TABLET BY MOUTH ON Sunday, Monday, Tuesday, Wednesday on AN EMPTY STOMACH WITH WATER ONLY. Take at least 30-60 MIN BEFORE BREAKFAST. 48 tablet 1  . levothyroxine (SYNTHROID) 112 MCG tablet TAKE 1 TABLET BY MOUTH ON THURSDAY,FRIDAY AND SATURDAY. TAKE ON EMPTY STOMACH  WITH WATER ONLY NO FOOD OROTHER MEDS FOR 30 MINS 36 tablet 1  . magic mouthwash SOLN Take 15 mLs by mouth 3 (three) times daily as needed for mouth pain (Modified Magic Mouthwash). 360 mL 0  . olmesartan (BENICAR) 20 MG tablet Take 1 tablet (20 mg total) by mouth 2 (two) times daily. 180 tablet 3  . pantoprazole (PROTONIX) 40 MG tablet TAKE 1 TABLET BY MOUTH TWICE DAILY 180 tablet 3  . [START ON 07/10/2020] sertraline (ZOLOFT) 100 MG tablet Take 1 tablet (100 mg total) by mouth daily. Medication may be crushed. 30 tablet 2  . SYMBICORT 160-4.5 MCG/ACT inhaler INHALE 2 PUFFS INTO THE LUNGS TWICE DAILY (Patient taking differently: Inhale 2 puffs into the lungs in the morning and at bedtime.) 10.2 g 5  . VENTOLIN HFA 108 (90 Base) MCG/ACT inhaler INHALE 2 PUFFS INTO THE LUNGS EVERY 6 HOURS AS NEEDED FOR WHEEZING OR SHORTNESS OF BREATH. (Patient taking differently: Inhale 2 puffs into the lungs every 6 (six) hours as needed for wheezing or shortness of breath.) 18 g 0  . zolpidem (AMBIEN) 5 MG tablet Take 1 tablet (5 mg total) by mouth at bedtime as needed for sleep. 30 tablet 2   No current facility-administered medications on file prior to visit.    BP 124/76   Pulse 71    Temp 97.9 F (36.6 C) (Temporal)   Ht 5\' 3"  (1.6 m)   Wt 159 lb 12.8 oz (72.5 kg)   SpO2 100%   BMI 28.31 kg/m    Objective:   Physical Exam Cardiovascular:     Rate and Rhythm: Normal rate and regular rhythm.  Pulmonary:     Effort: Pulmonary effort is normal.     Breath sounds: Normal breath sounds.  Musculoskeletal:     Comments: Able to get up and down from the exam table without assistance. Ambulates in office without assistance.   Neurological:     Mental Status: She is alert.     Deep Tendon Reflexes:     Reflex Scores:      Patellar reflexes are 2+ on the right side and 2+ on the left side.    Comments: Bilateral lower extremity strength equal on exam, some weakness in general, 4/5.             Assessment & Plan:

## 2020-06-14 NOTE — Assessment & Plan Note (Signed)
Evaluated by GI, underwent endoscopy with dilation, is doing much better now.

## 2020-06-16 ENCOUNTER — Telehealth: Payer: Self-pay | Admitting: Neurology

## 2020-06-16 NOTE — Telephone Encounter (Signed)
This patient is requesting a provider switch to Dr. Jaynee Eagles. She is being referred for numbness and tingling and weakness of bilateral lower extremities, as she has been previously worked up for in the past. Please advise if this is acceptable.  Thank you

## 2020-06-16 NOTE — Telephone Encounter (Signed)
This patient was seen previously and was felt to have psychogenic sensory complaints, if she has been rereferred, I would certainly want her to see another physician.  I have already offered my assessment.

## 2020-06-16 NOTE — Telephone Encounter (Signed)
I reviewed the chart and I am not sure that I have any more to offer patient. I recommend she discuss referral to a higher level of care (ie [facility] or [facility]) with her primary care.

## 2020-06-20 DIAGNOSIS — R29898 Other symptoms and signs involving the musculoskeletal system: Secondary | ICD-10-CM

## 2020-06-20 DIAGNOSIS — R202 Paresthesia of skin: Secondary | ICD-10-CM

## 2020-06-20 DIAGNOSIS — K219 Gastro-esophageal reflux disease without esophagitis: Secondary | ICD-10-CM

## 2020-06-20 DIAGNOSIS — R200 Anesthesia of skin: Secondary | ICD-10-CM

## 2020-06-21 ENCOUNTER — Other Ambulatory Visit: Payer: Self-pay

## 2020-06-21 ENCOUNTER — Ambulatory Visit (INDEPENDENT_AMBULATORY_CARE_PROVIDER_SITE_OTHER): Payer: 59 | Admitting: Psychiatry

## 2020-06-21 DIAGNOSIS — F0630 Mood disorder due to known physiological condition, unspecified: Secondary | ICD-10-CM | POA: Diagnosis not present

## 2020-06-21 DIAGNOSIS — S069X9S Unspecified intracranial injury with loss of consciousness of unspecified duration, sequela: Secondary | ICD-10-CM | POA: Diagnosis not present

## 2020-06-21 DIAGNOSIS — S069XAS Unspecified intracranial injury with loss of consciousness status unknown, sequela: Secondary | ICD-10-CM

## 2020-06-21 NOTE — Progress Notes (Signed)
Virtual Visit via Video Note  I connected with Katie Robbins on 06/21/20 at 2:07 PM EST  by a video enabled telemedicine application and verified that I am speaking with the correct person using two identifiers.  Location: Patient: Home Provider: [place] office    I discussed the limitations of evaluation and management by telemedicine and the availability of in person appointments. The patient expressed understanding and agreed to proceed.    I provided 51 minutes of non-face-to-face time during this encounter.   [name], LCSW         THERAPIST PROGRESS NOTE   Session Time: Tuesday 06/21/2020 2:07 PM - 2:58 PM   Participation Level: Active  Behavioral Response: Alert/Anxious/Talkative/tangentiality   Type of Therapy: Individual Therapy  Treatment Goals addressed:  Patient will learn coping skills to better manage anxiety and depressivesymptoms to increase level of functioning. She would like to improve self-esteem, and accept her newidentitypost-TBI.  Interventions: CBT and Supportive  Summary: [name] is a 65 y.o. female who is referred for services for continuity of care while her previous therapist is on medical leave.  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 She reports being impatient, becoming irritated with husband easily, having difficulty understanding people's conversations, pattern of interrupting conversations, being impulsive and saying things she thinks she shouldn't say, and a pattern of avoiding places.   Patient last was seen via virtual visit about 2 - 3 weeks ago.  She reports increased fatigue, muscle weakness, and decreased involvement in activities due to increased health issues.  However these have started to improve.  She is planning to resume use of daily planning to increase involvement in activity.  She continues to experience anxiety and panic attacks.  She  continues to use deep breathing to manage and reports this is helpful.  She reports she is now sleeping better since she has begun taking Ambien as prescribed by her psychiatrist.  However, she is experiencing some difficulty regarding having consistent bedtime.  Suicidal/Homicidal: Nowithout intent/plan  Therapist Response: reviewed symptoms, praised and reinforced patient's plan to resume daily planning, also encouraged and reminded patient to pace self, assisted patient identify ways to establish consistent bedtime with the use of bedtime ritual and planning, continue to discuss mindfulness and provided psychoeducation on mindfulness and the window of tolerance, assisted identify signs she is outside her window of tolerance, assisted patient identify and practice grounding techniques when outside the window of tolerance to help regulate emotions, develop plan with patient to practice a grounding technique daily    Plan: Return again in 2 weeks.  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

## 2020-07-05 ENCOUNTER — Ambulatory Visit (INDEPENDENT_AMBULATORY_CARE_PROVIDER_SITE_OTHER): Payer: 59 | Admitting: Psychiatry

## 2020-07-05 ENCOUNTER — Other Ambulatory Visit: Payer: Self-pay | Admitting: Primary Care

## 2020-07-05 ENCOUNTER — Other Ambulatory Visit: Payer: Self-pay

## 2020-07-05 DIAGNOSIS — F0630 Mood disorder due to known physiological condition, unspecified: Secondary | ICD-10-CM

## 2020-07-05 DIAGNOSIS — S069X9S Unspecified intracranial injury with loss of consciousness of unspecified duration, sequela: Secondary | ICD-10-CM | POA: Diagnosis not present

## 2020-07-05 DIAGNOSIS — S069XAS Unspecified intracranial injury with loss of consciousness status unknown, sequela: Secondary | ICD-10-CM

## 2020-07-05 DIAGNOSIS — E538 Deficiency of other specified B group vitamins: Secondary | ICD-10-CM

## 2020-07-05 NOTE — Progress Notes (Signed)
Virtual Visit via Video Note  I connected with Katie Robbins on 07/05/20 at 11:08 AM EST  by a video enabled telemedicine application and verified that I am speaking with the correct person using two identifiers.  Location: Patient: Home Provider: [place] office    I discussed the limitations of evaluation and management by telemedicine and the availability of in person appointments. The patient expressed understanding and agreed to proceed.  I provided 48  minutes of non-face-to-face time during this encounter.   [name], LCSW         THERAPIST PROGRESS NOTE   Session Time: Tuesday 07/05/2020 11:08 AM  - 11:56 AM   Participation Level: Active  Behavioral Response: Alert/Anxious/Talkative/tangentiality   Type of Therapy: Individual Therapy  Treatment Goals addressed:  Patient will learn coping skills to better manage anxiety and depressivesymptoms to increase level of functioning. She would like to improve self-esteem, and accept her newidentitypost-TBI.  Interventions: CBT and Supportive  Summary: Katie Robbins is a 65 y.o. female who is referred for services for continuity of care while her previous therapist is on medical leave.  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 She reports being impatient, becoming irritated with husband easily, having difficulty understanding people's conversations, pattern of interrupting conversations, being impulsive and saying things she thinks she shouldn't say, and a pattern of avoiding places.   Patient last was seen via virtual visit about 2 - 3 weeks ago.  She reports increased fatigue and muscle weakness related to her  Guillain Barre Syndrome diagnosis. This has made it difficult to try to increase involvement in activity.  However, she has made efforts and has resumed use of daily planning.  She is pacing self and trying to accomplish 1 activity per day.  She  expresses sadness and frustration as she is not able to do more activities as she wants to be more helpful to her husband.  She still experiences anxiety and panic attacks from time to time but continues to use deep breathing to manage.  Patient reports still trying to become more mindful of when she is outside the window of tolerance.  She has been using grounding techniques when she is aware she is outside of the window of tolerance.  She reports she has not been practicing grounding techniques daily.  She continues to report difficulty establishing a consistent bedtime.   Suicidal/Homicidal: Nowithout intent/plan  Therapist Response: reviewed symptoms, praised and reinforced patient's efforts to resume daily planning, discussed effects, assisted patient identify thoughts and processes that inhibited implementation of consistent bedtime, discussed possibly using an alarm to establish a consistent bedtime, praised and reinforced patient's use of grounding techniques, reviewed rationale for and developed plan with patient to practice daily, facilitated patient expressing thoughts and feelings about affect of Ethelene Hal syndrome on her current functioning, discussed self compassion and realistic expectations of self   Plan: Return again in 2 weeks.  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

## 2020-07-11 ENCOUNTER — Other Ambulatory Visit: Payer: Self-pay

## 2020-07-11 ENCOUNTER — Other Ambulatory Visit
Admission: RE | Admit: 2020-07-11 | Discharge: 2020-07-11 | Disposition: A | Discharge status: Home / Self Care | Payer: 59 | Source: Ambulatory Visit | Attending: Emergency Medicine | Admitting: Emergency Medicine

## 2020-07-11 DIAGNOSIS — Z20822 Contact with and (suspected) exposure to covid-19: Secondary | ICD-10-CM | POA: Diagnosis not present

## 2020-07-12 LAB — SARS CORONAVIRUS 2 (TAT 6-24 HRS): SARS Coronavirus 2: NEGATIVE

## 2020-07-14 ENCOUNTER — Telehealth: Payer: Self-pay | Admitting: Primary Care

## 2020-07-14 ENCOUNTER — Ambulatory Visit: Payer: 59 | Admitting: Emergency Medicine

## 2020-07-14 ENCOUNTER — Ambulatory Visit (INDEPENDENT_AMBULATORY_CARE_PROVIDER_SITE_OTHER): Payer: 59 | Admitting: Emergency Medicine

## 2020-07-14 ENCOUNTER — Other Ambulatory Visit: Payer: Self-pay

## 2020-07-14 ENCOUNTER — Encounter: Payer: Self-pay | Admitting: Emergency Medicine

## 2020-07-14 DIAGNOSIS — R0602 Shortness of breath: Secondary | ICD-10-CM

## 2020-07-14 DIAGNOSIS — J4540 Moderate persistent asthma, uncomplicated: Secondary | ICD-10-CM | POA: Diagnosis not present

## 2020-07-14 LAB — PULMONARY FUNCTION TEST
DL/VA % pred: 85 %
DL/VA: 3.61 ml/min/mmHg/L
DLCO cor % pred: 82 %
DLCO cor: 15.41 ml/min/mmHg
DLCO unc % pred: 82 %
DLCO unc: 15.41 ml/min/mmHg
FEF 25-75 Post: 3.01 L/sec
FEF 25-75 Pre: 2.15 L/sec
FEF2575-%Change-Post: 39 %
FEF2575-%Pred-Post: 145 %
FEF2575-%Pred-Pre: 104 %
FEV1-%Change-Post: 6 %
FEV1-%Pred-Post: 102 %
FEV1-%Pred-Pre: 95 %
FEV1-Post: 2.3 L
FEV1-Pre: 2.16 L
FEV1FVC-%Change-Post: -4 %
FEV1FVC-%Pred-Pre: 106 %
FEV6-%Change-Post: 9 %
FEV6-%Pred-Post: 101 %
FEV6-%Pred-Pre: 92 %
FEV6-Post: 2.87 L
FEV6-Pre: 2.63 L
FEV6FVC-%Change-Post: -2 %
FEV6FVC-%Pred-Post: 101 %
FEV6FVC-%Pred-Pre: 104 %
FVC-%Change-Post: 12 %
FVC-%Pred-Post: 99 %
FVC-%Pred-Pre: 89 %
FVC-Post: 2.95 L
FVC-Pre: 2.63 L
Post FEV1/FVC ratio: 78 %
Post FEV6/FVC ratio: 97 %
Pre FEV1/FVC ratio: 82 %
Pre FEV6/FVC Ratio: 100 %
RV % pred: 98 %
RV: 1.94 L
TLC % pred: 97 %
TLC: 4.61 L

## 2020-07-14 MED ORDER — MAGIC MOUTHWASH
15.0000 mL | Freq: Three times a day (TID) | ORAL | 0 refills | Status: DC | PRN
Start: 2020-07-14 — End: 2020-09-14

## 2020-07-14 NOTE — Patient Instructions (Signed)
Please stop your Symbicort. Keep albuterol available to use 2 puffs if needed for shortness of breath, chest tightness, wheezing. Follow-up with gastroenterology and neurology as planned. Follow with Dr. Lamonte Sakai in 12 months or sooner if you have any problems.

## 2020-07-14 NOTE — Progress Notes (Signed)
Full PFT performed today. °

## 2020-07-14 NOTE — Progress Notes (Signed)
Subjective:    Patient ID: Katie Robbins, female    DOB: Oct 22, 1955, 65 y.o.   MRN: [id number]  HPI  ROV 06/03/20 --65 year old woman, never smoker who follows up today for history of GERD with nutcracker esophagus, hypothyroidism, asthma.  Also deals with some chest discomfort and dysphagia from her esophageal disease.  Pulmonary function testing showed mild obstruction with a positive bronchodilator response and severe hyperinflation.  She has been treated with Symbicort.  Has albuterol which she uses approximately She reports today that she has been having increased exertional SOB since about August 2021, has been progressive, now has to stop to rest with just walking through the house. Began to be associated with mid sternal pain in the fall 2021. Can experience wheeze, not always associated with exertion but often is.  She is on Protonix 40 mg now on qd from bid.  She recently underwent repeat esophageal dilation (has had numerous times) in December with belief that it may help - temporarily helped her dysphagia but has not improved her breathing.   Ultrasound chest 04/06/2020 shows a 6.5 x 6 x 2 cm subcutaneous lipoma  ROV 07/14/20 --follow-up visit for 65 year old woman with a history of asthma and associated hyperinflation, nutcracker esophagus, GERD.  She has been having increased dyspnea with exertion, sometimes associated with wheezing.  Also upper abdominal and chest discomfort, dysphagia associated with her esophageal disease, improved since she underwent dilation prior to her last visit.  She is following with GI. Also continues to have MSK sternal pain. She uses albuterol several times a day - unclear whether she gets significant benefit from it.   She underwent pulmonary function testing today which I have reviewed, shows grossly normal airflows but with mild obstruction based on a positive bronchodilator response.  Normal lung volumes.  Normal diffusion capacity.  No evidence of  restriction or respiratory muscle weakness (history Guillain-Barr)  Chest x-ray 06/14/2020 reviewed, and was normal  MDM: Reviewed family medicine note 06/14/2020    Review of Systems  Constitutional: Negative for fever and unexpected weight change.  HENT: Negative for congestion, dental problem, ear pain, nosebleeds, postnasal drip, rhinorrhea, sinus pressure, sneezing, sore throat and trouble swallowing.   Eyes: Negative for redness and itching.  Respiratory: Positive for cough, chest tightness and shortness of breath. Negative for wheezing.   Cardiovascular: Negative for palpitations and leg swelling.  Gastrointestinal: Negative for nausea and vomiting.  Genitourinary: Negative for dysuria.  Musculoskeletal: Negative for joint swelling.  Skin: Negative for rash.  Neurological: Negative for headaches.  Hematological: Does not bruise/bleed easily.  Psychiatric/Behavioral: Negative for dysphoric mood. The patient is not nervous/anxious.         Objective:   Physical Exam Vitals:   07/14/20 1506  BP: 126/72  Pulse: 91  Temp: 97.8 F (36.6 C)  TempSrc: Temporal  SpO2: 99%  Weight: 153 lb (69.4 kg)  Height: 5\' 2"  (1.575 m)   Gen: Pleasant, well-nourished, in no distress, scattered affect  ENT: No lesions,  mouth clear,  oropharynx clear, no postnasal drip  Neck: No JVD, no stridor  Lungs: No use of accessory muscles, no wheeze or crackles  Cardiovascular: RRR, heart sounds normal, no murmur or gallops, no peripheral edema  Musculoskeletal: Significant tenderness to palpation mid and lower sternum, reproduces her discomfort  Neuro: alert, tangential, ? Some cognitive impairment or substance influence today  Skin: Warm, no lesions or rash      Assessment & Plan:  Dyspnea Her dyspnea seems to correlate  most with her chest discomfort which in turn correlates with musculoskeletal chest pain and esophageal symptoms.  Her PFT do confirm mild obstruction based on  bronchodilator response.  Her FEV1 falls into the normal range.  Her asthma is mild.  Her daily symptoms do not always respond to albuterol which argues against bronchospasm.  I think she can come off the Symbicort, continue albuterol as needed.  I do not see any evidence of restrictive disease on her PFT to suggest [place], although she does avoid deep breathing, splints due to her chest discomfort.  No evidence of abnormality on chest x-ray  Asthma Her PFT confirmed mild obstruction.  Stable since last checked.  I do not think her day-to-day symptoms relate to her asthma based on clinical history and failure to respond to albuterol.  I will stop her Symbicort, keep albuterol available as needed.  Baltazar Apo, MD, PhD 07/14/2020, 3:26 PM [facility] [phone] or if no answer [phone]

## 2020-07-14 NOTE — Telephone Encounter (Signed)
Joellen, see Rx in your inbox, can you fax?

## 2020-07-14 NOTE — Addendum Note (Signed)
Addended by: Pleas Koch on: 07/14/2020 05:12 PM   Modules accepted: Orders

## 2020-07-14 NOTE — Telephone Encounter (Signed)
Not sure what she is asking for do you remember?

## 2020-07-14 NOTE — Assessment & Plan Note (Signed)
Her PFT confirmed mild obstruction.  Stable since last checked.  I do not think her day-to-day symptoms relate to her asthma based on clinical history and failure to respond to albuterol.  I will stop her Symbicort, keep albuterol available as needed.

## 2020-07-14 NOTE — Telephone Encounter (Signed)
Patient just called and she left pulmonlogist and he wants her to use the mouth swallow more because he thinks it her esophagus not her breathing.  Patient would like a refill on this. Please give her a call back to discuss. EM

## 2020-07-14 NOTE — Assessment & Plan Note (Addendum)
Her dyspnea seems to correlate most with her chest discomfort which in turn correlates with musculoskeletal chest pain and esophageal symptoms.  Her PFT do confirm mild obstruction based on bronchodilator response.  Her FEV1 falls into the normal range.  Her asthma is mild.  Her daily symptoms do not always respond to albuterol which argues against bronchospasm.  I think she can come off the Symbicort, continue albuterol as needed.  I do not see any evidence of restrictive disease on her PFT to suggest [place], although she does avoid deep breathing, splints due to her chest discomfort.  No evidence of abnormality on chest x-ray

## 2020-07-14 NOTE — Telephone Encounter (Signed)
Patient was really hard to follow honestly. I tried to get clarification from her the best I could.

## 2020-07-15 NOTE — Telephone Encounter (Signed)
Called in today 

## 2020-07-19 ENCOUNTER — Ambulatory Visit (INDEPENDENT_AMBULATORY_CARE_PROVIDER_SITE_OTHER): Payer: 59 | Admitting: Psychiatry

## 2020-07-19 ENCOUNTER — Other Ambulatory Visit: Payer: Self-pay

## 2020-07-19 DIAGNOSIS — F0630 Mood disorder due to known physiological condition, unspecified: Secondary | ICD-10-CM

## 2020-07-19 DIAGNOSIS — S069XAS Unspecified intracranial injury with loss of consciousness status unknown, sequela: Secondary | ICD-10-CM

## 2020-07-19 DIAGNOSIS — S069X9D Unspecified intracranial injury with loss of consciousness of unspecified duration, subsequent encounter: Secondary | ICD-10-CM | POA: Diagnosis not present

## 2020-07-19 DIAGNOSIS — S069X9S Unspecified intracranial injury with loss of consciousness of unspecified duration, sequela: Secondary | ICD-10-CM

## 2020-07-19 NOTE — Progress Notes (Signed)
Virtual Visit via Video Note  I connected with Katie Robbins on 07/19/20 at 11:10 AM EST  by a video enabled telemedicine application and verified that I am speaking with the correct person using two identifiers.  Location: Patient: Home  Provider: [place] office    I discussed the limitations of evaluation and management by telemedicine and the availability of in person appointments. The patient expressed understanding and agreed to proceed.   I provided 50  minutes of non-face-to-face time during this encounter.   [name], LCSW         THERAPIST PROGRESS NOTE  Session Time: Tuesday 07/19/2020 11:10 AM - 12:00 PM  Participation Level: Active  Behavioral Response: Alert/Anxious/Talkative/tangentiality   Type of Therapy: Individual Therapy  Treatment Goals addressed:  Patient will learn coping skills to better manage anxiety and depressivesymptoms to increase level of functioning. She would like to improve self-esteem, and accept her newidentitypost-TBI.  Interventions: CBT and Supportive  Summary: Katie Robbins is a 65 y.o. female who is referred for services for continuity of care while her previous therapist is on medical leave.  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 She reports being impatient, becoming irritated with husband easily, having difficulty understanding people's conversations, pattern of interrupting conversations, being impulsive and saying things she thinks she shouldn't say, and a pattern of avoiding places.   Patient last was seen via virtual visit about 2 - 3 weeks ago.  She reports continued fatigue and muscle weakness related to her  Guillain Barre Syndrome diagnosis.  She also reports increased difficulty breathing due to issues with her esophagus.  She reports mainly going from the bed to the couch in the last 2 weeks.  She reports she has tried to participate in some  activities like working with flowers in her yard, folding clothes, or small tasks.however, she reports of being very weak and having to pace self.  She expresses frustration with self as well as guilt and sadness that she is not able to do more work.  She reports t thoughts of she should be doing these things that her husband now is having to take care of of.  She reports he is very supportive and does not complain.   Suicidal/Homicidal: Nowithout intent/plan  Therapist Response: reviewed symptoms, praised and reinforced patient's efforts to engage in activity, discussed stressors, facilitated expression of thoughts and feelings, validated feelings, discussed self compassion and realistic expectations of self, assisted patient identify the connection between thoughts/mood/behavior using examples from her life, assisted patient identify/challenge/and replace negative thoughts about self with helpful alternative, developed plan with patient to read replacement thought daily    Plan: Return again in 2 weeks.  Diagnosis: Axis I: Mood disorder as late effect of TBI, Anxiety Disorder due to general medical condition        [name], LCSW 07/19/2020

## 2020-07-28 DIAGNOSIS — M542 Cervicalgia: Secondary | ICD-10-CM | POA: Insufficient documentation

## 2020-07-29 ENCOUNTER — Other Ambulatory Visit: Payer: Self-pay

## 2020-07-29 ENCOUNTER — Telehealth (INDEPENDENT_AMBULATORY_CARE_PROVIDER_SITE_OTHER): Payer: 59

## 2020-07-29 DIAGNOSIS — R350 Frequency of micturition: Secondary | ICD-10-CM | POA: Diagnosis not present

## 2020-07-29 LAB — POC URINALSYSI DIPSTICK (AUTOMATED)
Bilirubin, UA: NEGATIVE
Blood, UA: NEGATIVE
Glucose, UA: NEGATIVE
Ketones, UA: NEGATIVE
Leukocytes, UA: NEGATIVE
Nitrite, UA: NEGATIVE
Protein, UA: NEGATIVE
Spec Grav, UA: 1.025 (ref 1.010–1.025)
Urobilinogen, UA: 2 E.U./dL — AB
pH, UA: 5 (ref 5.0–8.0)

## 2020-07-29 NOTE — Telephone Encounter (Signed)
Noted. It looks like it was dropped off, Terri put it by the sink near the refrigerator.  Please test for UTI. I ordered UA with culture.

## 2020-07-29 NOTE — Addendum Note (Signed)
Addended by: Pleas Koch on: 07/29/2020 11:18 AM   Modules accepted: Orders

## 2020-07-29 NOTE — Telephone Encounter (Signed)
Dip completed and culture sent to lab.

## 2020-07-29 NOTE — Telephone Encounter (Signed)
[facility] Night - Client Nonclinical Telephone Record  [facility] Client [place] Night - Client Client Site [place] Physician [name] - NP Contact Type Call Who Is Calling Patient / Member / Family / Caregiver [name] Name Saryna Kneeland Phone Number [phone] Patient Name Katie Robbins Patient DOB 11-11-1955 Call Type Message Only Information Provided Reason for Call Request for General Office Information Initial Comment [name] states she is having pressure on bladder, crying in pain. She wants to know if she can get a specimen cup to do testing for possible UTI Additional Comment hours provided Disp. Time Disposition Final User 07/29/2020 7:35:17 AM General Information Provided Yes Idolina Primer Call Closed By: Idolina Primer Transaction Date/Time: 07/29/2020 7:31:49 AM (ET)

## 2020-07-29 NOTE — Telephone Encounter (Signed)
Spoke with patient who stated that last night she got up to urinate four times during the night, which is abnormal for her. Patient wanted to bring in a urine sample just to make sure she doesn't have a urinary tract infection. Patients husband got a urine specimen cup this morning from the office and patient stated that she will be bringing it in shortly.

## 2020-07-30 LAB — URINE CULTURE
MICRO NUMBER:: 11665146
Result:: NO GROWTH
SPECIMEN QUALITY:: ADEQUATE

## 2020-08-02 ENCOUNTER — Other Ambulatory Visit: Payer: Self-pay

## 2020-08-02 ENCOUNTER — Ambulatory Visit (INDEPENDENT_AMBULATORY_CARE_PROVIDER_SITE_OTHER): Payer: 59 | Admitting: Psychiatry

## 2020-08-02 DIAGNOSIS — S069XAS Unspecified intracranial injury with loss of consciousness status unknown, sequela: Secondary | ICD-10-CM

## 2020-08-02 DIAGNOSIS — F0630 Mood disorder due to known physiological condition, unspecified: Secondary | ICD-10-CM | POA: Diagnosis not present

## 2020-08-02 DIAGNOSIS — S069X9S Unspecified intracranial injury with loss of consciousness of unspecified duration, sequela: Secondary | ICD-10-CM | POA: Diagnosis not present

## 2020-08-02 NOTE — Progress Notes (Signed)
Virtual Visit via Video Note  I connected with [name] on 08/02/20 at 11:15 AM EDT  by a video enabled telemedicine application and verified that I am speaking with the correct person using two identifiers.  Location: Patient: Home Provider: [place] office    I discussed the limitations of evaluation and management by telemedicine and the availability of in person appointments. The patient expressed understanding and agreed to proceed.  I provided 45  minutes of non-face-to-face time during this encounter.   [name], LCSW         THERAPIST PROGRESS NOTE  Session Time: Tuesday 08/02/2020 11:15 AM - 12:00 PM   Participation Level: Active  Behavioral Response: Alert/Anxious/Talkative/tangentiality   Type of Therapy: Individual Therapy  Treatment Goals addressed:  Patient will learn coping skills to better manage anxiety and depressivesymptoms to increase level of functioning. She would like to improve self-esteem, and accept her newidentitypost-TBI.  Interventions: CBT and Supportive  Summary: Katie Robbins is a 65 y.o. female who is referred for services for continuity of care while her previous therapist is on medical leave.  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 She reports being impatient, becoming irritated with husband easily, having difficulty understanding people's conversations, pattern of interrupting conversations, being impulsive and saying things she thinks she shouldn't say, and a pattern of avoiding places.   Patient last was seen via virtual visit about 2 - 3 weeks ago.  She reports  continued fatigue and muscle weakness related to her  Guillain Barre Syndrome diagnosis but has made more efforts to participate in activities she enjoys.  She reports she has been using replacement thought to counter negative thoughts about self.  She reports this has been helpful.  She reports  increased thoughts about her health as she has reading her medical records and now is able to understand some of the information that she previously did not understand due to some of her cognitive difficulties.  She expresses frustration as some of the medical language is difficult to understand.  She is hopeful about an appointment scheduled with [facility] on April 5 and is hopeful she will be scheduled for a neurocognitive work-up.    Suicidal/Homicidal: Nowithout intent/plan  Therapist Response: reviewed symptoms, praised and reinforced patient's efforts to engage in activity, praised and reinforced patient's efforts to use replacement thoughts, discussed effects, discussed stressors, facilitated expression of thoughts and feelings, validated feelings, assisted patient identify ways to improve assertiveness skills to express her needs and questions to her providers regarding simplifying information for her during appointments    Plan: Return again in 2 weeks.  Diagnosis: Axis I: Mood disorder as late effect of TBI, Anxiety Disorder due to general medical condition        [name], LCSW 08/02/2020

## 2020-08-11 ENCOUNTER — Telehealth: Payer: Self-pay

## 2020-08-11 ENCOUNTER — Other Ambulatory Visit (HOSPITAL_COMMUNITY): Payer: Self-pay | Admitting: Psychiatry

## 2020-08-11 ENCOUNTER — Other Ambulatory Visit: Payer: Self-pay

## 2020-08-11 ENCOUNTER — Telehealth (INDEPENDENT_AMBULATORY_CARE_PROVIDER_SITE_OTHER): Payer: 59 | Admitting: Psychiatry

## 2020-08-11 ENCOUNTER — Other Ambulatory Visit: Payer: Self-pay | Admitting: Primary Care

## 2020-08-11 DIAGNOSIS — F0630 Mood disorder due to known physiological condition, unspecified: Secondary | ICD-10-CM | POA: Diagnosis not present

## 2020-08-11 DIAGNOSIS — S069XAS Unspecified intracranial injury with loss of consciousness status unknown, sequela: Secondary | ICD-10-CM

## 2020-08-11 DIAGNOSIS — S069X9S Unspecified intracranial injury with loss of consciousness of unspecified duration, sequela: Secondary | ICD-10-CM | POA: Diagnosis not present

## 2020-08-11 DIAGNOSIS — F410 Panic disorder [episodic paroxysmal anxiety] without agoraphobia: Secondary | ICD-10-CM

## 2020-08-11 DIAGNOSIS — J4540 Moderate persistent asthma, uncomplicated: Secondary | ICD-10-CM

## 2020-08-11 MED ORDER — DIAZEPAM 5 MG PO TABS: 5.0000 mg | ORAL_TABLET | Freq: Three times a day (TID) | ORAL | 2 refills | Status: DC | PRN

## 2020-08-11 MED ORDER — AMPHETAMINE-DEXTROAMPHETAMINE 20 MG PO TABS: 20.0000 mg | ORAL_TABLET | Freq: Three times a day (TID) | ORAL | 0 refills | Status: DC

## 2020-08-11 MED ORDER — SERTRALINE HCL 100 MG PO TABS: 100.0000 mg | ORAL_TABLET | Freq: Every day | ORAL | 2 refills | Status: DC

## 2020-08-11 MED ORDER — ZOLPIDEM TARTRATE ER 12.5 MG PO TBCR: 12.5000 mg | EXTENDED_RELEASE_TABLET | Freq: Every evening | ORAL | 2 refills | Status: DC | PRN

## 2020-08-11 NOTE — Telephone Encounter (Signed)
Ok for generic

## 2020-08-11 NOTE — Telephone Encounter (Signed)
Called pharmacy ok given will fill for generic

## 2020-08-11 NOTE — Telephone Encounter (Signed)
Caryl Pina from [facility] called and stated that patients insurance does not cover the brand name of Ventolin and wanted to know whether it was ok to fill the generic brand? If not, a PA will need to be started. Please advise.
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
## 2020-08-16 DIAGNOSIS — G479 Sleep disorder, unspecified: Secondary | ICD-10-CM | POA: Insufficient documentation

## 2020-08-16 DIAGNOSIS — Z8659 Personal history of other mental and behavioral disorders: Secondary | ICD-10-CM | POA: Insufficient documentation

## 2020-08-18 MED ORDER — ESTRADIOL 1 MG PO TABS: 1.0000 mg | ORAL_TABLET | Freq: Every day | ORAL | 0 refills | Status: DC

## 2020-08-31 ENCOUNTER — Other Ambulatory Visit: Payer: Self-pay | Admitting: Primary Care

## 2020-09-07 ENCOUNTER — Ambulatory Visit (INDEPENDENT_AMBULATORY_CARE_PROVIDER_SITE_OTHER): Payer: 59 | Admitting: Psychiatry

## 2020-09-07 ENCOUNTER — Other Ambulatory Visit: Payer: Self-pay

## 2020-09-07 DIAGNOSIS — S069X9S Unspecified intracranial injury with loss of consciousness of unspecified duration, sequela: Secondary | ICD-10-CM | POA: Diagnosis not present

## 2020-09-07 DIAGNOSIS — F0630 Mood disorder due to known physiological condition, unspecified: Secondary | ICD-10-CM | POA: Diagnosis not present

## 2020-09-07 DIAGNOSIS — S069XAS Unspecified intracranial injury with loss of consciousness status unknown, sequela: Secondary | ICD-10-CM

## 2020-09-07 DIAGNOSIS — F410 Panic disorder [episodic paroxysmal anxiety] without agoraphobia: Secondary | ICD-10-CM | POA: Diagnosis not present

## 2020-09-07 NOTE — Progress Notes (Signed)
Virtual Visit via Video Note  I connected with Katie Robbins on 09/07/20 at 3:12 PM EDT by a video enabled telemedicine application and verified that I am speaking with the correct person using two identifiers.  Location: Patient: Home Provider: [place] office    I discussed the limitations of evaluation and management by telemedicine and the availability of in person appointments. The patient expressed understanding and agreed to proceed.  I provided 48 minutes of non-face-to-face time during this encounter.   [name], LCSW          THERAPIST PROGRESS NOTE  Session Time: Wednesday 09/07/2020 3:12 PM - 4:00 PM   Participation Level: Active  Behavioral Response: Alert/Anxious/Talkative/tangentiality   Type of Therapy: Individual Therapy  Treatment Goals addressed:  Patient will learn coping skills to better manage anxiety and depressivesymptoms to increase level of functioning. She would like to improve self-esteem, and accept her newidentitypost-TBI.  Interventions: CBT and Supportive  Summary: Katie Robbins is a 65 y.o. female who is referred for services for continuity of care while her previous therapist is on medical leave.  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 She reports being impatient, becoming irritated with husband easily, having difficulty understanding people's conversations, pattern of interrupting conversations, being impulsive and saying things she thinks she shouldn't say, and a pattern of avoiding places.   Patient last was seen via virtual visit about 4-5 weeks ago.  She reports seeing neurologist and being prescribed Seroquel since last session.  Per patient's report, she has experienced improved sleep pattern and and improved mood at times.  She says she has had moments of feeling good.  She is hopeful about working with neurologist and is pleased with information being  simplified.  She continues to experience physical pain pain and reports it is most intense in the mornings.  She normally starts to feel better and engages in more activity during the afternoon.  However, she reports thoughts of being a problem or being a project for her husband.  This triggers depressed mood and increased negative thoughts.  She continues to express frustration that she is not able to perform activities like she once did.   Suicidal/Homicidal: Nowithout intent/plan  Therapist Response: reviewed symptoms, discussed stressors, facilitated expression of thoughts and feelings, validated feelings, assisted patient examine triggers of increased depressed mood, assisted patient examine her thought patterns and effects on her mood and behavior, assisted patient challenge and replace unhelpful thoughts with helpful thoughts, developed plan for patient to use replacement thoughts daily in the mornings    Plan: Return again in 2 weeks.  Diagnosis: Axis I: Mood disorder as late effect of TBI, Anxiety Disorder due to general medical condition        [name], LCSW 09/07/2020

## 2020-09-13 ENCOUNTER — Other Ambulatory Visit: Payer: Self-pay | Admitting: Primary Care

## 2020-09-13 DIAGNOSIS — E039 Hypothyroidism, unspecified: Secondary | ICD-10-CM

## 2020-09-14 ENCOUNTER — Other Ambulatory Visit: Payer: Self-pay

## 2020-09-14 DIAGNOSIS — K219 Gastro-esophageal reflux disease without esophagitis: Secondary | ICD-10-CM

## 2020-09-14 MED ORDER — MAGIC MOUTHWASH: 15.0000 mL | Freq: Three times a day (TID) | ORAL | 0 refills | Status: DC | PRN

## 2020-09-20 ENCOUNTER — Telehealth: Payer: Self-pay

## 2020-09-20 NOTE — Telephone Encounter (Signed)
Pt left v/m said that the 360 ml of mouth swallow was approved and then denied; pt requesting refill of 360 mg of mouth swallow. This replaced the symbicort med that Dr Lamonte Sakai had pt on for esophagus and not for pts breathing. Pt request cb.

## 2020-09-20 NOTE — Telephone Encounter (Signed)
Will you take a look?  It looks like this was refilled on 09/14/2020, printed prescription in my absence.

## 2020-09-21 ENCOUNTER — Other Ambulatory Visit: Payer: Self-pay

## 2020-09-21 ENCOUNTER — Ambulatory Visit (INDEPENDENT_AMBULATORY_CARE_PROVIDER_SITE_OTHER): Payer: 59 | Admitting: Psychiatry

## 2020-09-21 DIAGNOSIS — F0630 Mood disorder due to known physiological condition, unspecified: Secondary | ICD-10-CM

## 2020-09-21 DIAGNOSIS — S069X9S Unspecified intracranial injury with loss of consciousness of unspecified duration, sequela: Secondary | ICD-10-CM | POA: Diagnosis not present

## 2020-09-21 DIAGNOSIS — S069XAS Unspecified intracranial injury with loss of consciousness status unknown, sequela: Secondary | ICD-10-CM

## 2020-09-21 NOTE — Telephone Encounter (Signed)
Called pharmacy refill was never received. I have given verbal to refill. I have called and l/m for patient letting know will be ready for pick up today. To call if any questions.
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
## 2020-09-26 ENCOUNTER — Telehealth (INDEPENDENT_AMBULATORY_CARE_PROVIDER_SITE_OTHER): Payer: 59 | Admitting: Psychiatry

## 2020-09-26 ENCOUNTER — Other Ambulatory Visit: Payer: Self-pay

## 2020-09-26 ENCOUNTER — Encounter (HOSPITAL_COMMUNITY): Payer: Self-pay | Admitting: Psychiatry

## 2020-09-26 ENCOUNTER — Telehealth (HOSPITAL_COMMUNITY): Payer: Self-pay

## 2020-09-26 DIAGNOSIS — F410 Panic disorder [episodic paroxysmal anxiety] without agoraphobia: Secondary | ICD-10-CM

## 2020-09-26 DIAGNOSIS — F0630 Mood disorder due to known physiological condition, unspecified: Secondary | ICD-10-CM

## 2020-09-26 DIAGNOSIS — S069X9S Unspecified intracranial injury with loss of consciousness of unspecified duration, sequela: Secondary | ICD-10-CM | POA: Diagnosis not present

## 2020-09-26 DIAGNOSIS — S069XAS Unspecified intracranial injury with loss of consciousness status unknown, sequela: Secondary | ICD-10-CM

## 2020-09-26 NOTE — Telephone Encounter (Signed)
Medication management - Telephone message left for patient to call back to review last refill dates of medications, per request of Dr. Einar Grad.  Contacted patient's [facility] to review dates and spoke to [place] who verified patient last filled Valium orders on 09/21/20, 08/17/20, and 07/19/20 with one remaining refill and that patient las filled Adderall orders on 09/08/20 and 08/11/20 with one remaining order on file to fill after 10/07/20.
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
## 2020-09-27 ENCOUNTER — Telehealth (HOSPITAL_COMMUNITY): Payer: Self-pay | Admitting: Psychiatry

## 2020-09-28 ENCOUNTER — Telehealth: Payer: Self-pay

## 2020-09-28 ENCOUNTER — Telehealth (HOSPITAL_COMMUNITY): Payer: Self-pay | Admitting: *Deleted

## 2020-09-28 ENCOUNTER — Telehealth (HOSPITAL_COMMUNITY): Payer: Self-pay | Admitting: Psychiatry

## 2020-09-28 NOTE — Telephone Encounter (Signed)
Noted, see MyChart message.

## 2020-09-28 NOTE — Telephone Encounter (Signed)
Pt states her psych is leaving the country... Need a referral for a new Psych... pt states she will send you a mychart msg as well... Dr Ledell Noss, or Coralyn Helling

## 2020-09-28 NOTE — Telephone Encounter (Signed)
Patient called and would like for provider to please call her back. Per pt, she was given 2 names of providers to take over her Medication Management from [place]. Per pt, her current med management doctor is going out of the country to take care of his father. Per pt, she would like to get provider preference on which one of these 2 providers would Peggy recommend or refer her to. Per pt, she trust Peggy's opinion due to her knowing her.   The names of the providers are: [facility] and Dr. Pat Kocher pt she do not have the first name for Dr. Ledell Noss).   Patient number is [phone].

## 2020-09-29 ENCOUNTER — Telehealth (HOSPITAL_COMMUNITY): Payer: Self-pay | Admitting: Psychiatry

## 2020-09-29 NOTE — Telephone Encounter (Signed)
Therapist returned call to patient regarding her request for referral to a provider.  Therapist informed patient she is not familiar with providers patient is considering and cannot make a recommendation.  Patient also is considering contacting her previous psychiatrist for medication management.  Patient agrees to keep scheduled appointment with therapist on Oct 05, 2020.

## 2020-10-05 ENCOUNTER — Ambulatory Visit (INDEPENDENT_AMBULATORY_CARE_PROVIDER_SITE_OTHER): Payer: 59 | Admitting: Psychiatry

## 2020-10-05 ENCOUNTER — Other Ambulatory Visit: Payer: Self-pay

## 2020-10-05 DIAGNOSIS — S069X9S Unspecified intracranial injury with loss of consciousness of unspecified duration, sequela: Secondary | ICD-10-CM | POA: Diagnosis not present

## 2020-10-05 DIAGNOSIS — F410 Panic disorder [episodic paroxysmal anxiety] without agoraphobia: Secondary | ICD-10-CM

## 2020-10-05 DIAGNOSIS — F0630 Mood disorder due to known physiological condition, unspecified: Secondary | ICD-10-CM | POA: Diagnosis not present

## 2020-10-05 DIAGNOSIS — S069XAS Unspecified intracranial injury with loss of consciousness status unknown, sequela: Secondary | ICD-10-CM
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
## 2020-10-19 ENCOUNTER — Ambulatory Visit (INDEPENDENT_AMBULATORY_CARE_PROVIDER_SITE_OTHER): Payer: 59 | Admitting: Psychiatry

## 2020-10-19 ENCOUNTER — Telehealth (HOSPITAL_COMMUNITY): Payer: Self-pay | Admitting: *Deleted

## 2020-10-19 ENCOUNTER — Other Ambulatory Visit: Payer: Self-pay

## 2020-10-19 DIAGNOSIS — F0630 Mood disorder due to known physiological condition, unspecified: Secondary | ICD-10-CM | POA: Diagnosis not present

## 2020-10-19 DIAGNOSIS — S069XAS Unspecified intracranial injury with loss of consciousness status unknown, sequela: Secondary | ICD-10-CM

## 2020-10-19 DIAGNOSIS — S069X9S Unspecified intracranial injury with loss of consciousness of unspecified duration, sequela: Secondary | ICD-10-CM | POA: Diagnosis not present

## 2020-10-19 NOTE — Telephone Encounter (Signed)
Refill request submitted by pt pharmacy for Valium 5 mg tablet tid. Former pt of Dr. Montel Culver. Rx last written on 08/11/20 with 2 refills. Please review. Thanks.
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
## 2020-10-20 MED ORDER — DIAZEPAM 5 MG PO TABS: 5.0000 mg | ORAL_TABLET | Freq: Three times a day (TID) | ORAL | 0 refills | Status: DC | PRN

## 2020-10-20 NOTE — Telephone Encounter (Signed)
A 30-day bridge prescription of Valium 5 mg 3 times a day as needed sent to [facility] by covering MD.  Patient will establish care with a new provider.

## 2020-10-27 ENCOUNTER — Telehealth (HOSPITAL_COMMUNITY): Payer: 59 | Admitting: Psychiatry

## 2020-11-02 ENCOUNTER — Other Ambulatory Visit: Payer: Self-pay

## 2020-11-02 ENCOUNTER — Ambulatory Visit (INDEPENDENT_AMBULATORY_CARE_PROVIDER_SITE_OTHER): Payer: 59 | Admitting: Psychiatry

## 2020-11-02 DIAGNOSIS — S069X9S Unspecified intracranial injury with loss of consciousness of unspecified duration, sequela: Secondary | ICD-10-CM

## 2020-11-02 DIAGNOSIS — F0630 Mood disorder due to known physiological condition, unspecified: Secondary | ICD-10-CM

## 2020-11-02 DIAGNOSIS — F410 Panic disorder [episodic paroxysmal anxiety] without agoraphobia: Secondary | ICD-10-CM

## 2020-11-02 DIAGNOSIS — S069XAS Unspecified intracranial injury with loss of consciousness status unknown, sequela: Secondary | ICD-10-CM

## 2020-11-02 NOTE — Progress Notes (Signed)
Virtual Visit via Video Note  I connected with Katie Robbins on 11/02/20 at 10:08 AM EDT by a video enabled telemedicine application and verified that I am speaking with the correct person using two identifiers.  Location: Patient: Home Provider: [place] office    I discussed the limitations of evaluation and management by telemedicine and the availability of in person appointments. The patient expressed understanding and agreed to proceed.   I provided 52 minutes of non-face-to-face time during this encounter.   [name], LCSW    THERAPIST PROGRESS NOTE  Session Time: Wednesday 11/02/2020 10:08 AM - 11:00 AM   Participation Level: Active  Behavioral Response: Alert/Anxious/Talkative/tangentiality   Type of Therapy: Individual Therapy  Treatment Goals addressed:  Patient will reduce irritability and increase normal social interaction with family and friends AEB pt reducing negative comments and harsh tone of voice during interaction with husband from daily to 3 x or less per week for 3 consecutive weeks.   Interventions: CBT and Supportive  Summary: Bitania Shankland is a 65 y.o. female who is referred for services for continuity of care while her previous therapist is on medical leave.  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 She reports being impatient, becoming irritated with husband easily, having difficulty understanding people's conversations, pattern of interrupting conversations, being impulsive and saying things she thinks she shouldn't say, and a pattern of avoiding places.   Patient last was seen via virtual visit about 2 weeks ago.  She states that she has not been doing well since last session.  Patient reports increased pain and states just not feeling well.  She reports breathing issues along with headaches as well as pain in other parts of her body.  Patient reports crying every morning for the  past couple of weeks due to just not feeling well.  She reports periods of depressed mood along with continued irritability.. She expresses frustration with self as she continues to make negative comments and use a harsh tone of voice with her husband.  She continues to struggle with should and ought thought patterns regarding expectations of self.  She also continues to experience loss issues related to her changed functioning.  She is hopeful about recent visit with psychiatrist as her medication has been changed.  She no longer is taking Seroquel but now is taking olanzapine and hopes this will cause her to feel less groggy.  Patient reports she does still tries to participate in some activities such as coloring, sitting on her deck, working with her flowers when she is able, she also reports cooking dinner twice since last session   Suicidal/Homicidal: Nowithout intent/plan  Therapist Response: reviewed symptoms, discussed stressors, facilitated expression of thoughts and feelings, validated feelings, praised and reinforced patient's efforts to try to stay involved in activity, reviewed rationale for daily structure, reviewed treatment plan, discussed next  steps for treatment, obtaining patient's permission to initial plan as this was a virtual visit,    Plan: Return again in 2 weeks.  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

## 2020-11-16 ENCOUNTER — Other Ambulatory Visit: Payer: Self-pay

## 2020-11-16 ENCOUNTER — Ambulatory Visit (INDEPENDENT_AMBULATORY_CARE_PROVIDER_SITE_OTHER): Payer: 59 | Admitting: Psychiatry

## 2020-11-16 DIAGNOSIS — F410 Panic disorder [episodic paroxysmal anxiety] without agoraphobia: Secondary | ICD-10-CM | POA: Diagnosis not present

## 2020-11-16 DIAGNOSIS — S069XAS Unspecified intracranial injury with loss of consciousness status unknown, sequela: Secondary | ICD-10-CM

## 2020-11-16 DIAGNOSIS — S069X9S Unspecified intracranial injury with loss of consciousness of unspecified duration, sequela: Secondary | ICD-10-CM

## 2020-11-16 DIAGNOSIS — F0630 Mood disorder due to known physiological condition, unspecified: Secondary | ICD-10-CM | POA: Diagnosis not present

## 2020-11-16 NOTE — Progress Notes (Signed)
Virtual Visit via Video Note  I connected with Katie Robbins on 11/16/20 at 10:12 AM EDT by a video enabled telemedicine application and verified that I am speaking with the correct person using two identifiers.  Location: Patient: Home Provider: [place] office    I discussed the limitations of evaluation and management by telemedicine and the availability of in person appointments. The patient expressed understanding and agreed to proceed.  I provided 45 minutes of non-face-to-face time during this encounter.   [name], LCSW   THERAPIST PROGRESS NOTE  Session Time: Wednesday 11/16/2020 10:12 AM - 10:57 AM   Participation Level: Active  Behavioral Response: Alert/Anxious/Talkative/tangentiality   Type of Therapy: Individual Therapy  Treatment Goals addressed:  Patient will reduce irritability and increase normal social interaction with family and friends AEB pt reducing negative comments and harsh tone of voice during interaction with husband from daily to 3 x or less per week for 3 consecutive weeks.   Interventions: CBT and Supportive  Summary: Katie Robbins is a 65 y.o. female who is referred for services for continuity of care while her previous therapist is on medical leave.  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 She reports being impatient, becoming irritated with husband easily, having difficulty understanding people's conversations, pattern of interrupting conversations, being impulsive and saying things she thinks she shouldn't say, and a pattern of avoiding places.   Patient last was seen via virtual visit about 2 weeks ago.  She states having 1 good week and 1 bad week since last session. She reports increased irritability, depressed mood, fatigue, and staying in the bed most of the day for two days during the bad week. She attributes this mainly to physical issues. She is feeling better this week  and and is looking forward to going on an outing today to do several errands. She also reports enjoying going shopping with a friend today. She reports being more active and cooking dinner a couple of times. She reports implementing plan developed last session to write down 1 positive comment daily about husband and reports this has helped. She reports she and her husband have had increased positive interaction.  She continues to express frustration husband sometimes will not perform tasks she expects him to do but admits she does not ask him to do these tasks.  She also reports husband has said to her just ask if she needs something.  She continues to struggle asking for help.  Suicidal/Homicidal: Nowithout intent/plan  Therapist Response: reviewed symptoms, praised and reinforced patient's efforts to implement plan discussed in last session, discussed effects, praised and reinforced increase behavioral activation, discussed effects, assisted patient identify her pattern of interaction with her husband, assisted patient identify triggers of negative comments and responses from patient, assisted patient identify her thought pattern of mind reading, discussed ways to improve assertive communication regarding making requests, assisted patient identify realistic expectations of husband    Plan: Return again in 2 weeks.  Diagnosis: Axis I: Mood disorder as late effect of TBI, Anxiety Disorder due to general medical condition        [name], LCSW 11/16/2020

## 2020-11-28 NOTE — Telephone Encounter (Signed)
Added to open message from patient. No further action needed for this message.
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
## 2020-12-07 ENCOUNTER — Other Ambulatory Visit: Payer: Self-pay | Admitting: Primary Care

## 2020-12-07 DIAGNOSIS — K224 Dyskinesia of esophagus: Secondary | ICD-10-CM

## 2020-12-09 ENCOUNTER — Other Ambulatory Visit: Payer: Self-pay

## 2020-12-09 ENCOUNTER — Encounter: Payer: Self-pay | Admitting: Primary Care

## 2020-12-09 ENCOUNTER — Ambulatory Visit (INDEPENDENT_AMBULATORY_CARE_PROVIDER_SITE_OTHER): Payer: Self-pay | Admitting: Primary Care

## 2020-12-09 VITALS — BP 128/70 | HR 86 | Temp 98.2°F | Ht 62.0 in | Wt 147.0 lb

## 2020-12-09 DIAGNOSIS — R7303 Prediabetes: Secondary | ICD-10-CM

## 2020-12-09 DIAGNOSIS — Z1231 Encounter for screening mammogram for malignant neoplasm of breast: Secondary | ICD-10-CM

## 2020-12-09 DIAGNOSIS — N183 Chronic kidney disease, stage 3 unspecified: Secondary | ICD-10-CM

## 2020-12-09 DIAGNOSIS — E039 Hypothyroidism, unspecified: Secondary | ICD-10-CM

## 2020-12-09 DIAGNOSIS — J4540 Moderate persistent asthma, uncomplicated: Secondary | ICD-10-CM

## 2020-12-09 DIAGNOSIS — G3184 Mild cognitive impairment, so stated: Secondary | ICD-10-CM

## 2020-12-09 DIAGNOSIS — N3010 Interstitial cystitis (chronic) without hematuria: Secondary | ICD-10-CM

## 2020-12-09 DIAGNOSIS — M5010 Cervical disc disorder with radiculopathy, unspecified cervical region: Secondary | ICD-10-CM

## 2020-12-09 DIAGNOSIS — F0630 Mood disorder due to known physiological condition, unspecified: Secondary | ICD-10-CM

## 2020-12-09 DIAGNOSIS — R5382 Chronic fatigue, unspecified: Secondary | ICD-10-CM

## 2020-12-09 DIAGNOSIS — Z0000 Encounter for general adult medical examination without abnormal findings: Secondary | ICD-10-CM

## 2020-12-09 DIAGNOSIS — S069XAS Unspecified intracranial injury with loss of consciousness status unknown, sequela: Secondary | ICD-10-CM

## 2020-12-09 DIAGNOSIS — R519 Headache, unspecified: Secondary | ICD-10-CM

## 2020-12-09 DIAGNOSIS — K219 Gastro-esophageal reflux disease without esophagitis: Secondary | ICD-10-CM

## 2020-12-09 DIAGNOSIS — E538 Deficiency of other specified B group vitamins: Secondary | ICD-10-CM

## 2020-12-09 DIAGNOSIS — G8929 Other chronic pain: Secondary | ICD-10-CM

## 2020-12-09 DIAGNOSIS — R200 Anesthesia of skin: Secondary | ICD-10-CM

## 2020-12-09 DIAGNOSIS — S069X9S Unspecified intracranial injury with loss of consciousness of unspecified duration, sequela: Secondary | ICD-10-CM

## 2020-12-09 DIAGNOSIS — K224 Dyskinesia of esophagus: Secondary | ICD-10-CM | POA: Insufficient documentation

## 2020-12-09 DIAGNOSIS — I10 Essential (primary) hypertension: Secondary | ICD-10-CM

## 2020-12-09 DIAGNOSIS — R202 Paresthesia of skin: Secondary | ICD-10-CM

## 2020-12-09 DIAGNOSIS — IMO0001 Reserved for inherently not codable concepts without codable children: Secondary | ICD-10-CM

## 2020-12-09 DIAGNOSIS — E2839 Other primary ovarian failure: Secondary | ICD-10-CM

## 2020-12-09 NOTE — Assessment & Plan Note (Signed)
Discussed the importance of a healthy diet and regular exercise in order for weight loss, and to reduce the risk of further co-morbidity. ? ?Repeat A1C pending. ?

## 2020-12-09 NOTE — Assessment & Plan Note (Signed)
Compliant to B12 injections every 2-3 weeks, continue same. Repeat b12 level pending.
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
## 2020-12-09 NOTE — Assessment & Plan Note (Signed)
Follows with GYN, doing well with Estrace cream and estradiol 1 mg.  Continue same.

## 2020-12-09 NOTE — Assessment & Plan Note (Signed)
Following with neuropsychology, psychiatry, neurology. Continue Abilify 10 mg for which was just initiated.  Continue Adderall 20 mg as prescribed. Continue Valium 5 mg as needed.

## 2020-12-09 NOTE — Assessment & Plan Note (Signed)
Immunizations up-to-date.  Declines Shingrix given history of Guillen Barr. Mammogram due in September, orders placed Bone density scan due, orders placed. Colonoscopy up-to-date, due in 2028.  Encouraged a healthy diet and regular exercise.  Exam today as noted. Labs pending, she will return when fasting.

## 2020-12-09 NOTE — Assessment & Plan Note (Signed)
Compliant to both doses of levothyroxine 100 mcg Sunday-Wednesday and levothyroxine 112 mcg Thurs-Saturday.   She is taking this correctly. Continue current regimen.  Repeat TSH pending.

## 2020-12-09 NOTE — Assessment & Plan Note (Signed)
Following with nephrology. Repeat renal function pending.  

## 2020-12-09 NOTE — Patient Instructions (Signed)
Set up a lab appointment when you'd like to return fasting.  [facility] and [facility] density scan.  Take the handicap placard to the [facility].   It was a pleasure to see you today!  Preventive Care 31 Years and Older, Female Preventive care refers to lifestyle choices and visits with your health care provider that can promote health and wellness. This includes: A yearly physical exam. This is also called an annual wellness visit. Regular dental and eye exams. Immunizations. Screening for certain conditions. Healthy lifestyle choices, such as: Eating a healthy diet. Getting regular exercise. Not using drugs or products that contain nicotine and tobacco. Limiting alcohol use. What can I expect for my preventive care visit? Physical exam Your health care provider will check your: Height and weight. These may be used to calculate your BMI (body mass index). BMI is a measurement that tells if you are at a healthy weight. Heart rate and blood pressure. Body temperature. Skin for abnormal spots. Counseling Your health care provider may ask you questions about your: Past medical problems. Family's medical history. Alcohol, tobacco, and drug use. Emotional well-being. Home life and relationship well-being. Sexual activity. Diet, exercise, and sleep habits. History of falls. Memory and ability to understand (cognition). Work and work Statistician. Pregnancy and menstrual history. Access to firearms. What immunizations do I need?  Vaccines are usually given at various ages, according to a schedule. Your health care provider will recommend vaccines for you based on your age, medicalhistory, and lifestyle or other factors, such as travel or where you work. What tests do I need? Blood tests Lipid and cholesterol levels. These may be checked every 5 years, or more often depending on your overall health. Hepatitis C test. Hepatitis B test. Screening Lung  cancer screening. You may have this screening every year starting at age 28 if you have a 30-pack-year history of smoking and currently smoke or have quit within the past 15 years. Colorectal cancer screening. All adults should have this screening starting at age 13 and continuing until age 25. Your health care provider may recommend screening at age 87 if you are at increased risk. You will have tests every 1-10 years, depending on your results and the type of screening test. Diabetes screening. This is done by checking your blood sugar (glucose) after you have not eaten for a while (fasting). You may have this done every 1-3 years. Mammogram. This may be done every 1-2 years. Talk with your health care provider about how often you should have regular mammograms. Abdominal aortic aneurysm (AAA) screening. You may need this if you are a current or former smoker. BRCA-related cancer screening. This may be done if you have a family history of breast, ovarian, tubal, or peritoneal cancers. Other tests STD (sexually transmitted disease) testing, if you are at risk. Bone density scan. This is done to screen for osteoporosis. You may have this done starting at age 82. Talk with your health care provider about your test results, treatment options,and if necessary, the need for more tests. Follow these instructions at home: Eating and drinking  Eat a diet that includes fresh fruits and vegetables, whole grains, lean protein, and low-fat dairy products. Limit your intake of foods with high amounts of sugar, saturated fats, and salt. Take vitamin and mineral supplements as recommended by your health care provider. Do not drink alcohol if your health care provider tells you not to drink. If you drink alcohol: Limit how  much you have to 0-1 drink a day. Be aware of how much alcohol is in your drink. In the [place] one drink equals one 12 oz bottle of beer (355 mL), one 5 oz glass of wine (148 mL), or one  1 oz glass of hard liquor (44 mL).  Lifestyle Take daily care of your teeth and gums. Brush your teeth every morning and night with fluoride toothpaste. Floss one time each day. Stay active. Exercise for at least 30 minutes 5 or more days each week. Do not use any products that contain nicotine or tobacco, such as cigarettes, e-cigarettes, and chewing tobacco. If you need help quitting, ask your health care provider. Do not use drugs. If you are sexually active, practice safe sex. Use a condom or other form of protection in order to prevent STIs (sexually transmitted infections). Talk with your health care provider about taking a low-dose aspirin or statin. Find healthy ways to cope with stress, such as: Meditation, yoga, or listening to music. Journaling. Talking to a trusted person. Spending time with friends and family. Safety Always wear your seat belt while driving or riding in a vehicle. Do not drive: If you have been drinking alcohol. Do not ride with someone who has been drinking. When you are tired or distracted. While texting. Wear a helmet and other protective equipment during sports activities. If you have firearms in your house, make sure you follow all gun safety procedures. What's next? Visit your health care provider once a year for an annual wellness visit. Ask your health care provider how often you should have your eyes and teeth checked. Stay up to date on all vaccines. This information is not intended to replace advice given to you by your health care provider. Make sure you discuss any questions you have with your healthcare provider. Document Revised: 04/20/2020 Document Reviewed: 04/24/2018 Elsevier Patient Education  2022 [employer].

## 2020-12-09 NOTE — Assessment & Plan Note (Addendum)
Chronic for years, diagnosis of "nutcrakcer esophagus" for which she took magic mouthwash. Her husband today endorses we have the incorrect prescription.  Will consult with GI to determine magic mouthwash prescription. Her husband will find the prior ingredients of her older Rx.   She did well historically on magic mouthwash.  She also takes amlodipine 5 mg daily for nutcracker esophagus.

## 2020-12-09 NOTE — Progress Notes (Signed)
Subjective:    Patient ID: Katie Robbins, female    DOB: 1955-12-02, 65 y.o.   MRN: [id number]  HPI  Katie Robbins is a very pleasant 65 y.o. female who presents today for complete physical and follow up of chronic conditions.  She is with her husband today who is helping to provide information for HPI.  She is also requesting a handicap placard for her chronic and ongoing balance issues.  She often requires the use of a cane or her husband to ambulate far distances.  She is also needing a letter stating that she has been struggling with balance.  Follow with neurology for ongoing symptoms of difficulty sleeping, headaches, anxiety, weakness, fatigue.  Long history of chronic headaches, admitted to taking BC powder frequently which can cause rebound headaches.  For this reason she was told to discontinue immediately.  Her headaches have improved.  Now on Abilify 10 mg for mood disorder that was initiated by her neurologist 2 days ago.  She cannot tolerate Seroquel or Zyprexa due to extreme drowsiness.  She is to undergo EMG and nerve conduction studies to upper and lower extremities.  She will also be referred to rheumatology through [facility] for suspicion of autoimmune disease.  Her husband mentions today that her Magic mouthwash prescription has been incorrect for years.  Her current prescription is for 3 ingredients but her prior prescription had at least 5 ingredients which included an antispasmodic.  Her original prescription became from [place] for which she used to treat esophageal spasms from her nutcracker esophagus.  She does follow with GI, did mention esophageal spasms but her GI doctor was unfamiliar with the utilization of Magic mouthwash for such spasms.  Her pulmonologist recently removed her from Symbicort as her symptoms were determined to be secondary to esophageal spasms and not asthma.  Her current prescription for Magic mouthwash is effective, but she requires use much more  frequently than her prior prescription.  Immunizations: -Tetanus: 2016 -Covid-19: 2 vaccines -Shingles: Never completed  -Pneumonia: 2016  Diet: Bray.  Exercise: No regular exercise.  Eye exam: Completes annually  Dental exam: Completes semi-annually   Pap Smear: Hysterectomy  Mammogram: Completed in September 2021 Dexa: Completed in Colonoscopy: Completed in 2021, due in 2028  BP Readings from Last 3 Encounters:  12/09/20 128/70  07/14/20 126/72  06/14/20 124/76         Review of Systems  Constitutional:  Positive for fatigue. Negative for unexpected weight change.  HENT:  Negative for rhinorrhea.   Respiratory:  Negative for cough and shortness of breath.   Cardiovascular:  Negative for chest pain.  Gastrointestinal:  Positive for constipation. Negative for diarrhea.       Esophageal spasms, see HPI.  Genitourinary:  Negative for difficulty urinating.  Musculoskeletal:  Positive for arthralgias and myalgias.  Skin:  Negative for rash.  Allergic/Immunologic: Negative for environmental allergies.  Neurological:  Positive for weakness, light-headedness and numbness. Negative for headaches.       Imbalance  Psychiatric/Behavioral:  The patient is nervous/anxious.         Past Medical History:  Diagnosis Date   Anxiety    Arthritis    Asthma    Chicken pox    Chronic headaches    Diverticulitis    Essential hypertension    GERD (gastroesophageal reflux disease)    Guillain Barr syndrome ([place]) 1982   H/O breast implant    Heart murmur    Hypertension    Hypothyroidism  Interstitial cystitis    Interstitial cystitis    Mitral prolapse 1985   Nutcracker esophagus     Social History   Socioeconomic History   Marital status: Married    Spouse name: [name]   Number of children: 2   Years of education: Some college   Highest education level: Not on file  Occupational History   Occupation: Retired  Tobacco Use   Smoking status: Never    Smokeless tobacco: Never  Vaping Use   Vaping Use: Never used  Substance and Sexual Activity   Alcohol use: Yes    Comment: socially   Drug use: No   Sexual activity: Yes    Birth control/protection: Surgical  Other Topics Concern   Not on file  Social History Narrative   Lives with husband   Caffeine use: rare   Right handed   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     Social Determinants of Health   Financial Resource Strain: Not on file  Food Insecurity: Not on file  Transportation Needs: Not on file  Physical Activity: Not on file  Stress: Not on file  Social Connections: Not on file  Intimate Partner Violence: Not on file    Past Surgical History:  Procedure Laterality Date   ABDOMINAL HYSTERECTOMY  2000/2009   APPENDECTOMY  2007   BOWEL RESECTION  2007   BREAST SURGERY     CARDIAC CATHETERIZATION  2016   Clean   CARPAL TUNNEL RELEASE Right    CHOLECYSTECTOMY  2012   CHOLECYSTECTOMY  2012   COLONOSCOPY WITH PROPOFOL N/A 04/13/2020   Procedure: COLONOSCOPY WITH PROPOFOL;  Surgeon: Lin Landsman, MD;  Location: [facility];  Service: Gastroenterology;  Laterality: N/A;   ESOPHAGOGASTRODUODENOSCOPY (EGD) WITH PROPOFOL N/A 04/13/2020   Procedure: ESOPHAGOGASTRODUODENOSCOPY (EGD) WITH PROPOFOL;  Surgeon: Lin Landsman, MD;  Location: [facility] ENDOSCOPY;  Service: Gastroenterology;  Laterality: N/A;   NECK SURGERY  06/11/2016   plates and screws in neck    SMALL INTESTINE SURGERY  2007   Bowel resection   TONSILLECTOMY     VESICO-VAGINAL FISTULA REPAIR  2007    Family History  Problem Relation Age of Onset   Hypertension Mother    GER disease Mother    Polymyalgia rheumatica Mother    COPD Mother    Osteoarthritis Mother    Osteoporosis Mother    Other Mother        Giant Cell Arteritis    GER disease Father    Heart attack Father     Allergies  Allergen Reactions   Bactrim [Sulfamethoxazole-Trimethoprim] Hives   Macrobid [Nitrofurantoin Macrocrystal] Hives    Tizanidine Other (See Comments)    Bad Mood changs   Vicodin [Hydrocodone-Acetaminophen] Swelling    Puffy eyes and face   Trileptal [Oxcarbazepine] Rash   Acetaminophen     Other reaction(s): Puffy face/eyes   Hydrocodone    Nexium [Esomeprazole Magnesium]     Face swelling, hives    Prilosec [Omeprazole]     Face swelling, hives    Sulfa Antibiotics    Tamoxifen    Trimethoprim     Other reaction(s): Hives   Wasp Venom Hives    swelling   Erythromycin Rash   Lamotrigine Rash   Molds & Smuts Anxiety    Current Outpatient Medications on File Prior to Visit  Medication Sig Dispense Refill   amLODipine (NORVASC) 5 MG tablet TAKE 1 TABLET BY MOUTH ONCE DAILY FOR BLOOD PRESSURE 90 tablet 3   ARIPiprazole (ABILIFY) 10  MG tablet Take 1/2 tab at night for a week then increase to 1 tab at night and continue that dose     cyanocobalamin (,VITAMIN B-12,) 1000 MCG/ML injection INJECT 1 VIAL (1 ML) INTO THE MUSCLE EVERY 3 TO 4 WEEKS 24 mL 1   diazepam (VALIUM) 5 MG tablet Take 1 tablet (5 mg total) by mouth 3 (three) times daily as needed. 90 tablet 0   EPINEPHrine 0.3 mg/0.3 mL IJ SOAJ injection Inject 0.3 mLs (0.3 mg total) into the muscle as needed for anaphylaxis. 1 each 0   estradiol (ESTRACE) 1 MG tablet Take 1 tablet (1 mg total) by mouth daily. 30 tablet 0   levothyroxine (SYNTHROID) 100 MCG tablet TAKE 1 TABLET BY MOUTH ON SUNDAY, MONDAYTUESDAY AND WEDNESDAY ON AN EMPTY STOMACH WITH WATER ONLY. TKE ATLEAST 30-60 MINUTES BEFORE BREAKFAST 48 tablet 1   levothyroxine (SYNTHROID) 112 MCG tablet TAKE 1 TABLET BY MOUTH ON THURSDAY,FRIDAY AND SATURDAY. TAKE ON EMPTY STOMACH WITH WATER ONLY NO FOOD OROTHER MEDS FOR 30 MINS 36 tablet 1   magic mouthwash SOLN Take 15 mLs by mouth 3 (three) times daily as needed for mouth pain (Modified Magic Mouthwash ). 360 mL 0   olmesartan (BENICAR) 20 MG tablet Take 1 tablet (20 mg total) by mouth 2 (two) times daily. 180 tablet 3   pantoprazole (PROTONIX)  40 MG tablet TAKE 1 TABLET BY MOUTH TWICE DAILY 180 tablet 3   VENTOLIN HFA 108 (90 Base) MCG/ACT inhaler INHALE 2 PUFFS INTO THE LUNGS EVERY 6 HOURS AS NEEDED FOR WHEEZING OR SHORTNESS OF BREATH. 18 g 0   amphetamine-dextroamphetamine (ADDERALL) 20 MG tablet Take 1 tablet (20 mg total) by mouth in the morning, at noon, and at bedtime. 90 tablet 0   amphetamine-dextroamphetamine (ADDERALL) 20 MG tablet Take 1 tablet (20 mg total) by mouth in the morning, at noon, and at bedtime. 90 tablet 0   amphetamine-dextroamphetamine (ADDERALL) 20 MG tablet Take 1 tablet (20 mg total) by mouth in the morning, at noon, and at bedtime. 90 tablet 0   No current facility-administered medications on file prior to visit.    BP 128/70 (BP Location: Left Arm, Patient Position: Sitting, Cuff Size: Large)   Pulse 86   Temp 98.2 F (36.8 C) (Temporal)   Ht '5\' 2"'$  (1.575 m)   Wt 147 lb (66.7 kg)   SpO2 98%   BMI 26.89 kg/m  Objective:   Physical Exam HENT:     Right Ear: Tympanic membrane and ear canal normal.     Left Ear: Tympanic membrane and ear canal normal.     Nose: Nose normal.  Eyes:     Conjunctiva/sclera: Conjunctivae normal.     Pupils: Pupils are equal, round, and reactive to light.  Neck:     Thyroid: No thyromegaly.  Cardiovascular:     Rate and Rhythm: Normal rate and regular rhythm.     Heart sounds: No murmur heard. Pulmonary:     Effort: Pulmonary effort is normal.     Breath sounds: Normal breath sounds. No rales.  Abdominal:     General: Bowel sounds are normal.     Palpations: Abdomen is soft.     Tenderness: There is no abdominal tenderness.  Musculoskeletal:     Cervical back: Neck supple.     Comments: Generalized decrease in range of motion to lower back. Ambulating decently in office with the support of her husband.  Lymphadenopathy:     Cervical: No cervical  adenopathy.  Skin:    General: Skin is warm and dry.     Findings: No rash.  Neurological:     Mental  Status: She is alert and oriented to person, place, and time.     Cranial Nerves: No cranial nerve deficit.     Deep Tendon Reflexes: Reflexes are normal and symmetric.     Comments: Answering most all questions correctly, husband does have to correct her some.  Psychiatric:        Mood and Affect: Mood normal.          Assessment & Plan:      This visit occurred during the SARS-CoV-2 public health emergency.  Safety protocols were in place, including screening questions prior to the visit, additional usage of staff PPE, and extensive cleaning of exam room while observing appropriate contact time as indicated for disinfecting solutions.

## 2020-12-09 NOTE — Assessment & Plan Note (Signed)
Stable in the office today, continue amlodipine 5 mg daily and olmesartan 20 mg daily.

## 2020-12-09 NOTE — Assessment & Plan Note (Signed)
Chronic and ongoing. Referred to rheumatology for evaluation of potential autoimmune disorder that could be contributing.  Checking labs as well.

## 2020-12-09 NOTE — Assessment & Plan Note (Addendum)
Improving now that she has discontinued use of BC Powder which can cause rebound headaches. Continue off BC Powder.

## 2020-12-09 NOTE — Assessment & Plan Note (Signed)
Following with pulmonology, no longer on Symbicort BID as she was told that she is experiencing esophageal spasms.    Continue off of Symbicort.

## 2020-12-09 NOTE — Assessment & Plan Note (Signed)
Following with neurosurgery, overall stable.

## 2020-12-09 NOTE — Assessment & Plan Note (Signed)
Managed on pantoprazole 40 mg BID, doing well on this regimen, continue same.

## 2020-12-09 NOTE — Assessment & Plan Note (Signed)
Chronic and continued. Will undergo nerve conduction studies to upper and lower extremities per neurology.

## 2020-12-09 NOTE — Assessment & Plan Note (Signed)
Overall stable.  Denies concerns today.

## 2020-12-16 ENCOUNTER — Telehealth: Payer: Self-pay | Admitting: Primary Care

## 2020-12-16 ENCOUNTER — Telehealth: Payer: Self-pay

## 2020-12-16 ENCOUNTER — Other Ambulatory Visit: Payer: Self-pay

## 2020-12-16 NOTE — Telephone Encounter (Signed)
Mrs. Reisenauer called in and asked Anda Kraft if she is should call Dr. Dominica Severin crame due to the neck pain

## 2020-12-16 NOTE — Telephone Encounter (Signed)
[facility] Night - Client Nonclinical Telephone Record Access[facility]urse Client [place] Night - Client Client Site [place] Physician [name] - NP Contact Type Call Who Is Calling Patient / Member / Family / Caregiver [name] Name [place] Phone Number [phone] Patient Name Katie Robbins Patient DOB November 05, 1955 Call Type Message Only Information Provided Reason for Call Request to [facility] Appointment Initial Comment [name] wants to cancel lab work Patient request to speak to RN No Additional Comment Office hours provided. Disp. Time Disposition Final User 12/16/2020 7:39:48 AM General Information Provided Yes Jaclyn Prime Call Closed By: Jaclyn Prime Transaction Date/Time: 12/16/2020 7:38:15 AM (ET)

## 2020-12-16 NOTE — Telephone Encounter (Signed)
I did call the dr for her

## 2020-12-16 NOTE — Telephone Encounter (Signed)
Sorry she was having neck pain and wanted to know if she should call her neck surgeon Dr. Mannie Stabile but I connected her to the office.

## 2020-12-16 NOTE — Telephone Encounter (Signed)
Sorry not sure what this is asking for?

## 2020-12-17 ENCOUNTER — Emergency Department (HOSPITAL_COMMUNITY)
Admission: EM | Admit: 2020-12-17 | Discharge: 2020-12-18 | Disposition: A | Discharge status: Home / Self Care | Payer: 59 | Attending: Emergency Medicine | Admitting: Emergency Medicine

## 2020-12-17 ENCOUNTER — Other Ambulatory Visit: Payer: Self-pay

## 2020-12-17 ENCOUNTER — Emergency Department (HOSPITAL_COMMUNITY): Payer: 59

## 2020-12-17 ENCOUNTER — Encounter (HOSPITAL_COMMUNITY): Payer: Self-pay | Admitting: Emergency Medicine

## 2020-12-17 DIAGNOSIS — N183 Chronic kidney disease, stage 3 unspecified: Secondary | ICD-10-CM | POA: Insufficient documentation

## 2020-12-17 DIAGNOSIS — I129 Hypertensive chronic kidney disease with stage 1 through stage 4 chronic kidney disease, or unspecified chronic kidney disease: Secondary | ICD-10-CM | POA: Diagnosis not present

## 2020-12-17 DIAGNOSIS — J45909 Unspecified asthma, uncomplicated: Secondary | ICD-10-CM | POA: Diagnosis not present

## 2020-12-17 DIAGNOSIS — E039 Hypothyroidism, unspecified: Secondary | ICD-10-CM | POA: Diagnosis not present

## 2020-12-17 DIAGNOSIS — Z79899 Other long term (current) drug therapy: Secondary | ICD-10-CM | POA: Diagnosis not present

## 2020-12-17 DIAGNOSIS — M542 Cervicalgia: Secondary | ICD-10-CM | POA: Diagnosis not present

## 2020-12-17 DIAGNOSIS — R531 Weakness: Secondary | ICD-10-CM | POA: Diagnosis not present

## 2020-12-17 DIAGNOSIS — R202 Paresthesia of skin: Secondary | ICD-10-CM | POA: Diagnosis not present

## 2020-12-17 DIAGNOSIS — S161XXA Strain of muscle, fascia and tendon at neck level, initial encounter: Secondary | ICD-10-CM

## 2020-12-17 MED ORDER — OXYCODONE HCL 5 MG PO TABS
5.0000 mg | ORAL_TABLET | Freq: Once | ORAL | Status: AC
Start: 2020-12-17 — End: 2020-12-17
  Administered 2020-12-17: 5 mg via ORAL
  Filled 2020-12-17: qty 1

## 2020-12-17 MED ORDER — KETOROLAC TROMETHAMINE 30 MG/ML IJ SOLN
30.0000 mg | Freq: Once | INTRAMUSCULAR | Status: AC
  Administered 2020-12-17: 30 mg via INTRAMUSCULAR
  Filled 2020-12-17: qty 1

## 2020-12-17 MED ORDER — METHOCARBAMOL 500 MG PO TABS: 500.0000 mg | ORAL_TABLET | Freq: Three times a day (TID) | ORAL | 0 refills | Status: DC | PRN

## 2020-12-17 MED ORDER — OXYCODONE HCL 5 MG PO TABS: 5.0000 mg | ORAL_TABLET | ORAL | 0 refills | Status: DC | PRN

## 2020-12-17 NOTE — ED Provider Notes (Signed)
Care transferred to me.  MRI results have been reviewed and there is no significant acute finding though a lot of chronic findings.  Nothing that would explain her pain/arm symptoms.  At this point she appears stable for discharge home.  Will give short course of pain meds and muscle relaxer and have her follow-up with her neurosurgeon.   Sherwood Gambler, MD 12/17/20 [phone]

## 2020-12-17 NOTE — ED Notes (Signed)
Pt waiting on mri  

## 2020-12-17 NOTE — Discharge Instructions (Addendum)
If you develop worsening, recurrent, or continued neck pain, numbness or weakness in the arms or legs, incontinence of your bowels or bladders, numbness of your buttocks, fever, chest pain, or any other new/concerning symptoms then return to the ER for evaluation.

## 2020-12-17 NOTE — ED Triage Notes (Signed)
Pt reports fall 3 weeks ago with neck pain, difficulty swallowing that is gradually getting worse, and numbness to all extremities.  Pt of Dr. Saintclair Halsted.  Has not seen him since the fall.

## 2020-12-17 NOTE — ED Notes (Signed)
In mri 

## 2020-12-17 NOTE — ED Provider Notes (Signed)
[facility] EMERGENCY DEPARTMENT Provider Note   CSN: [id number] Arrival date & time: 12/17/20  0914     History Chief Complaint  Patient presents with   Neck Pain    Katie Robbins is a 65 y.o. female.   Neck Pain  Patient presents to the ED with complaints of neck pain.  Patient has a history of cervical spine disease.  She has had prior surgery.  Patient states she was out gardening a few weeks ago when she fell.  Patient initially did not think too much of it.  However in the last several days she has had increasing neck pain.  States it is hard for her to turn her neck without pain and discomfort.  She also has had increased numbness and weakness in all 4 extremities.  Patient did have prior neck fusion and was told she might have problems in the future.  She denies any headache.  No vomiting.  No sore throat.  Past Medical History:  Diagnosis Date   Anxiety    Arthritis    Asthma    Chicken pox    Chronic headaches    Diverticulitis    Essential hypertension    GERD (gastroesophageal reflux disease)    Guillain Barr syndrome ([place]) 1982   H/O breast implant    Heart murmur    Hypertension    Hypothyroidism    Interstitial cystitis    Interstitial cystitis    Mitral prolapse 1985   Nutcracker esophagus     Patient Active Problem List   Diagnosis Date Noted   Esophageal spasm 12/09/2020   Non-cardiac chest pain 06/13/2020   Esophageal dysphagia    Soft tissue mass 03/31/2020   CKD (chronic kidney disease) stage 3, GFR 30-59 ml/min ([place]) 06/30/2019   Dysuria 03/16/2019   Vitamin B 12 deficiency 01/27/2019   Panic disorder 12/29/2018   Mood disorder as late effect of traumatic brain injury ([place]) 08/07/2018   Mild neurocognitive disorder due to traumatic brain injury ([place]) 08/07/2018   Prediabetes 07/08/2018   Numbness and tingling 07/08/2018   Environmental and seasonal allergies 04/24/2018   Dyspnea 02/06/2018   Chronic fatigue 01/23/2018    Primary osteoarthritis of left knee 05/29/2017   S/P carpal tunnel release 05/29/2017   Primary osteoarthritis of both hands 05/29/2017   History of bilateral breast implants 05/29/2017   Interstitial cystitis 05/29/2017   History of mitral valve prolapse 05/29/2017   History of diverticulitis 05/29/2017   Cervical disc disorder with radiculopathy of cervical region 04/12/2016   Preventative health care 11/28/2015   Esophageal reflux 08/19/2015   Essential hypertension 08/19/2015   Hypothyroidism 08/19/2015   Asthma 08/19/2015   Chronic headaches 08/19/2015   Estrogen deficiency 08/19/2015    Past Surgical History:  Procedure Laterality Date   ABDOMINAL HYSTERECTOMY  2000/2009   APPENDECTOMY  2007   BOWEL RESECTION  2007   BREAST SURGERY     CARDIAC CATHETERIZATION  2016   Clean   CARPAL TUNNEL RELEASE Right    CHOLECYSTECTOMY  2012   CHOLECYSTECTOMY  2012   COLONOSCOPY WITH PROPOFOL N/A 04/13/2020   Procedure: COLONOSCOPY WITH PROPOFOL;  Surgeon: Lin Landsman, MD;  Location: [facility] ENDOSCOPY;  Service: Gastroenterology;  Laterality: N/A;   ESOPHAGOGASTRODUODENOSCOPY (EGD) WITH PROPOFOL N/A 04/13/2020   Procedure: ESOPHAGOGASTRODUODENOSCOPY (EGD) WITH PROPOFOL;  Surgeon: Lin Landsman, MD;  Location: [facility] ENDOSCOPY;  Service: Gastroenterology;  Laterality: N/A;   NECK SURGERY  06/11/2016   plates and screws in  neck    SMALL INTESTINE SURGERY  2007   Bowel resection   TONSILLECTOMY     VESICO-VAGINAL FISTULA REPAIR  2007     OB History   No obstetric history on file.     Family History  Problem Relation Age of Onset   Hypertension Mother    GER disease Mother    Polymyalgia rheumatica Mother    COPD Mother    Osteoarthritis Mother    Osteoporosis Mother    Other Mother        Giant Cell Arteritis    GER disease Father    Heart attack Father     Social History   Tobacco Use   Smoking status: Never   Smokeless tobacco: Never  Vaping Use    Vaping Use: Never used  Substance Use Topics   Alcohol use: Yes    Comment: socially   Drug use: No    Home Medications Prior to Admission medications   Medication Sig Start Date End Date Taking? Authorizing Provider  amLODipine (NORVASC) 5 MG tablet TAKE 1 TABLET BY MOUTH ONCE DAILY FOR BLOOD PRESSURE 12/08/20   Pleas Koch, NP  amphetamine-dextroamphetamine (ADDERALL) 20 MG tablet Take 1 tablet (20 mg total) by mouth in the morning, at noon, and at bedtime. 08/08/20 09/07/20  Pucilowski, Marchia Bond, MD  amphetamine-dextroamphetamine (ADDERALL) 20 MG tablet Take 1 tablet (20 mg total) by mouth in the morning, at noon, and at bedtime. 10/07/20 11/06/20  Pucilowski, Marchia Bond, MD  amphetamine-dextroamphetamine (ADDERALL) 20 MG tablet Take 1 tablet (20 mg total) by mouth in the morning, at noon, and at bedtime. 09/07/20 10/07/20  Pucilowski, Marchia Bond, MD  ARIPiprazole (ABILIFY) 10 MG tablet Take 1/2 tab at night for a week then increase to 1 tab at night and continue that dose 12/07/20   [provider]  cyanocobalamin (,VITAMIN B-12,) 1000 MCG/ML injection INJECT 1 VIAL (1 ML) INTO THE MUSCLE EVERY 3 TO 4 WEEKS 07/06/20   Pleas Koch, NP  diazepam (VALIUM) 5 MG tablet Take 1 tablet (5 mg total) by mouth 3 (three) times daily as needed. 10/20/20 01/18/21  Arfeen, Arlyce Harman, MD  EPINEPHrine 0.3 mg/0.3 mL IJ SOAJ injection Inject 0.3 mLs (0.3 mg total) into the muscle as needed for anaphylaxis. 02/11/19   Pleas Koch, NP  estradiol (ESTRACE) 1 MG tablet Take 1 tablet (1 mg total) by mouth daily. 08/18/20   Jearld Fenton, NP  levothyroxine (SYNTHROID) 100 MCG tablet TAKE 1 TABLET BY MOUTH ON SUNDAY, MONDAYTUESDAY AND WEDNESDAY ON AN EMPTY STOMACH WITH WATER ONLY. TKE ATLEAST 30-60 MINUTES BEFORE BREAKFAST 09/13/20   Lesleigh Noe, MD  levothyroxine (SYNTHROID) 112 MCG tablet TAKE 1 TABLET BY MOUTH ON THURSDAY,FRIDAY AND SATURDAY. TAKE ON EMPTY STOMACH WITH WATER ONLY NO FOOD OROTHER MEDS  FOR 30 MINS 09/13/20   Lesleigh Noe, MD  magic mouthwash SOLN Take 15 mLs by mouth 3 (three) times daily as needed for mouth pain (Modified Magic Mouthwash ). 09/14/20   Pleas Koch, NP  olmesartan (BENICAR) 20 MG tablet Take 1 tablet (20 mg total) by mouth 2 (two) times daily. 04/12/20   Pleas Koch, NP  pantoprazole (PROTONIX) 40 MG tablet TAKE 1 TABLET BY MOUTH TWICE DAILY 12/30/19   Pleas Koch, NP  VENTOLIN HFA 108 (90 Base) MCG/ACT inhaler INHALE 2 PUFFS INTO THE LUNGS EVERY 6 HOURS AS NEEDED FOR WHEEZING OR SHORTNESS OF BREATH. 08/11/20   Pleas Koch,  NP    Allergies    Bactrim [sulfamethoxazole-trimethoprim], Macrobid [nitrofurantoin macrocrystal], Tizanidine, Vicodin [hydrocodone-acetaminophen], Trileptal [oxcarbazepine], Acetaminophen, Hydrocodone, Nexium [esomeprazole magnesium], Prilosec [omeprazole], Sulfa antibiotics, Tamoxifen, Trimethoprim, Wasp venom, Erythromycin, Lamotrigine, and Molds & smuts  Review of Systems   Review of Systems  Musculoskeletal:  Positive for neck pain.  All other systems reviewed and are negative.  Physical Exam Updated Vital Signs BP (!) 162/92 (BP Location: Left Arm)   Pulse 86   Temp 98.4 F (36.9 C)   Resp 16   SpO2 100%   Physical Exam Vitals and nursing note reviewed.  Constitutional:      General: She is not in acute distress.    Appearance: She is well-developed.  HENT:     Head: Normocephalic and atraumatic.     Right Ear: External ear normal.     Left Ear: External ear normal.  Eyes:     General: No scleral icterus.       Right eye: No discharge.        Left eye: No discharge.     Conjunctiva/sclera: Conjunctivae normal.  Neck:     Trachea: No tracheal deviation.     Comments: Paraspinal neck tenderness Cardiovascular:     Rate and Rhythm: Normal rate and regular rhythm.  Pulmonary:     Effort: Pulmonary effort is normal. No respiratory distress.     Breath sounds: Normal breath sounds. No  stridor. No wheezing or rales.  Abdominal:     General: Bowel sounds are normal. There is no distension.     Palpations: Abdomen is soft.     Tenderness: There is no abdominal tenderness. There is no guarding or rebound.  Musculoskeletal:        General: No deformity.     Cervical back: Neck supple. Tenderness present.  Skin:    General: Skin is warm and dry.     Findings: No rash.  Neurological:     General: No focal deficit present.     Mental Status: She is alert.     Cranial Nerves: No cranial nerve deficit (no facial droop, extraocular movements intact, no slurred speech).     Sensory: No sensory deficit.     Motor: Weakness present. No abnormal muscle tone or seizure activity.     Coordination: Coordination normal.     Comments: Weak grip strength bilaterally, weak plantar flexion bilaterally  Psychiatric:        Mood and Affect: Mood normal.    ED Results / Procedures / Treatments   Labs (all labs ordered are listed, but only abnormal results are displayed) Labs Reviewed - No data to display  EKG None  Radiology No results found.  Procedures Procedures   Medications Ordered in ED Medications - No data to display  ED Course  I have reviewed the triage vital signs and the nursing notes.  Pertinent labs & imaging results that were available during my care of the patient were reviewed by me and considered in my medical decision making (see chart for details).  Clinical Course as of 12/17/20 1727  Sat Dec 17, 2020  1102 CT C-spine without signs of fracture. [id number] Patient does complain of new neurologic symptoms.  Will proceed with MRI of cervical spine [JK]    Clinical Course User Index [JK] Dorie Rank, MD   MDM Rules/Calculators/A&P  Pt with complaints of neck pain, new neuro sx.  No fx noted on CT.  With her weakness, neuro complaints MRI C spine ordered to rule out acute neurosurgical condition.  Care turned over to Dr  Regenia Skeeter pending MRI Final Clinical Impression(s) / ED Diagnoses Neck pain    Dorie Rank, MD 12/17/20 1729

## 2020-12-17 NOTE — ED Notes (Signed)
Pt connected back to her wires  She reports  That her pain is coming baxck  Waiting for the mri results

## 2020-12-17 NOTE — ED Notes (Signed)
Patient update on plan of care.  Husband remains at bedside.  Provided with apple sauce per request

## 2020-12-20 ENCOUNTER — Other Ambulatory Visit: Payer: Self-pay

## 2020-12-20 ENCOUNTER — Other Ambulatory Visit (INDEPENDENT_AMBULATORY_CARE_PROVIDER_SITE_OTHER): Payer: 59

## 2020-12-20 ENCOUNTER — Telehealth: Payer: Self-pay

## 2020-12-20 DIAGNOSIS — E538 Deficiency of other specified B group vitamins: Secondary | ICD-10-CM

## 2020-12-20 DIAGNOSIS — E039 Hypothyroidism, unspecified: Secondary | ICD-10-CM | POA: Diagnosis not present

## 2020-12-20 DIAGNOSIS — R5382 Chronic fatigue, unspecified: Secondary | ICD-10-CM | POA: Diagnosis not present

## 2020-12-20 DIAGNOSIS — R7303 Prediabetes: Secondary | ICD-10-CM

## 2020-12-20 DIAGNOSIS — I10 Essential (primary) hypertension: Secondary | ICD-10-CM

## 2020-12-20 LAB — COMPREHENSIVE METABOLIC PANEL
ALT: 22 U/L (ref 0–35)
AST: 32 U/L (ref 0–37)
Albumin: 3.9 g/dL (ref 3.5–5.2)
Alkaline Phosphatase: 132 U/L — ABNORMAL HIGH (ref 39–117)
BUN: 14 mg/dL (ref 6–23)
CO2: 27 mEq/L (ref 19–32)
Calcium: 8.9 mg/dL (ref 8.4–10.5)
Chloride: 101 mEq/L (ref 96–112)
Creatinine, Ser: 1.1 mg/dL (ref 0.40–1.20)
GFR: 52.84 mL/min — ABNORMAL LOW (ref >60.00)
Glucose, Bld: 107 mg/dL — ABNORMAL HIGH (ref 70–99)
Potassium: 4 mEq/L (ref 3.5–5.1)
Sodium: 137 mEq/L (ref 135–145)
Total Bilirubin: 0.4 mg/dL (ref 0.2–1.2)
Total Protein: 6.9 g/dL (ref 6.0–8.3)

## 2020-12-20 LAB — LIPID PANEL
Cholesterol: 180 mg/dL (ref 0–200)
HDL: 68.1 mg/dL (ref >39.00)
LDL Cholesterol: 91 mg/dL (ref 0–99)
NonHDL: 111.92
Total CHOL/HDL Ratio: 3
Triglycerides: 103 mg/dL (ref 0.0–149.0)
VLDL: 20.6 mg/dL (ref 0.0–40.0)

## 2020-12-20 LAB — VITAMIN B12: Vitamin B-12: 513 pg/mL (ref 211–911)

## 2020-12-20 LAB — CBC
HCT: 36.9 % (ref 36.0–46.0)
Hemoglobin: 12.2 g/dL (ref 12.0–15.0)
MCHC: 33 g/dL (ref 30.0–36.0)
MCV: 90.2 fl (ref 78.0–100.0)
Platelets: 280 10*3/uL (ref 150.0–400.0)
RBC: 4.09 Mil/uL (ref 3.87–5.11)
RDW: 14.5 % (ref 11.5–15.5)
WBC: 7.4 10*3/uL (ref 4.0–10.5)

## 2020-12-20 LAB — MONONUCLEOSIS SCREEN: Mono Screen: NEGATIVE

## 2020-12-20 LAB — TSH: TSH: 7.01 u[IU]/mL — ABNORMAL HIGH (ref 0.35–5.50)

## 2020-12-20 LAB — HEMOGLOBIN A1C: Hgb A1c MFr Bld: 5.8 % (ref 4.6–6.5)

## 2020-12-20 NOTE — Telephone Encounter (Signed)
-----   Message from Lin Landsman, MD sent at 12/20/2020  8:52 AM EDT ----- Regarding: RE: Esophageal Spasms/Magic Mouthwash There is no role for magic mouth wash in esophageal dysmotility disorders. I have asked her to continue as needed for spasms as she was on it before.   Diltiazem or TCA have been used for esophageal spasm/dysmotility. I would not use amlodipine either. She actually needs to be referred to motility specialist if her dysphagia symptoms are recurring. When I saw her in 05/2020, she said her dysphagia symptoms were significantly better  Happy to see her for follow up  Caryl Pina,  Please call pt and check if she wants a follow up appt with me  Thanks RV ----- Message ----- From: Pleas Koch, NP Sent: 12/09/2020   4:35 PM EDT To: Lin Landsman, MD Subject: Esophageal Spasms/Magic Mouthwash              Hi Dr. Marius Ditch!  I saw our mutual patient today for her general follow-up/physical.  She and her husband had several questions regarding her Magic mouthwash prescription for which was prescribed to her originally by her GI provider at [facility] years ago.  Do you know anything about nutcracker esophagus/esophageal spasms and Magic mouthwash?  How about the use of amlodipine for esophageal spasms?  I see that you are out of the office for a few weeks, so this can definitely wait until you return.  I appreciate your time and hope you had a restful time off! Allie Bossier, NP-C [place]

## 2020-12-20 NOTE — Telephone Encounter (Signed)
Called patient and patient made follow up appointment for 12/28/2020. She states the magic mouth wash that we prescribe for her is not what she has been taking. She states she need a anti spasms medication in it

## 2020-12-21 ENCOUNTER — Telehealth: Payer: Self-pay | Admitting: Primary Care

## 2020-12-21 NOTE — Telephone Encounter (Signed)
Patient returned call.would like a call back. 

## 2020-12-22 ENCOUNTER — Other Ambulatory Visit: Payer: Self-pay

## 2020-12-22 ENCOUNTER — Ambulatory Visit (INDEPENDENT_AMBULATORY_CARE_PROVIDER_SITE_OTHER): Payer: 59 | Admitting: Psychiatry

## 2020-12-22 DIAGNOSIS — S069XAS Unspecified intracranial injury with loss of consciousness status unknown, sequela: Secondary | ICD-10-CM

## 2020-12-22 DIAGNOSIS — F0630 Mood disorder due to known physiological condition, unspecified: Secondary | ICD-10-CM

## 2020-12-22 DIAGNOSIS — F410 Panic disorder [episodic paroxysmal anxiety] without agoraphobia: Secondary | ICD-10-CM

## 2020-12-22 DIAGNOSIS — S069X9S Unspecified intracranial injury with loss of consciousness of unspecified duration, sequela: Secondary | ICD-10-CM | POA: Diagnosis not present

## 2020-12-22 NOTE — Telephone Encounter (Signed)
Spoke to patient see lab notes.

## 2020-12-22 NOTE — Progress Notes (Signed)
Virtual Visit via Video Note  I connected with Katie Robbins on 12/22/20 at 2:07 PM EDT by a video enabled telemedicine application and verified that I am speaking with the correct person using two identifiers.  Location: Patient: Home Provider: [place] office    I discussed the limitations of evaluation and management by telemedicine and the availability of in person appointments. The patient expressed understanding and agreed to proceed.   I provided 47 minutes of non-face-to-face time during this encounter.   [name], LCSW    THERAPIST PROGRESS NOTE  Session Time: Thursday 12/22/2020 2:07 PM - 2:54 PM  Participation Level: Active  Behavioral Response: Alert/Anxious/Talkative/tangentiality   Type of Therapy: Individual Therapy  Treatment Goals addressed:  Patient will reduce irritability and increase normal social interaction with family and friends AEB pt reducing negative comments and harsh tone of voice during interaction with husband from daily to 3 x or less per week for 3 consecutive weeks.   Interventions: CBT and Supportive  Summary: Katie Robbins is a 65 y.o. female who is referred for services for continuity of care while her previous therapist is on medical leave.  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 She reports being impatient, becoming irritated with husband easily, having difficulty understanding people's conversations, pattern of interrupting conversations, being impulsive and saying things she thinks she shouldn't say, and a pattern of avoiding places.   Patient last was seen via virtual visit about 5-7 weeks ago.  She reports improved mood and improved sleep pattern since last session.  Per her report, she has begun taking increased dosage of Abilify and this has been very helpful regarding her sleep.  Patient reports improved hygiene by having consistent going to bedtime and wake up  time.  She continues to have multiple physical health issues but is hopeful about working with her medical providers.  She tries to stay involved in activities when she can.  She continues to do arts and crafts and cook when able.  She reports more realistic expectations of self and is better able to pace self.  She reports improved interaction with husband and decreased negative comments.  She also has been more assertive and has been asking help from husband when needed.  Suicidal/Homicidal: Nowithout intent/plan  Therapist Response: reviewed symptoms, praised and reinforced patient's improved sleep hygiene, praised and reinforced patient's efforts to pace self and to have more realistic expectations of self, praised and reinforced her efforts to improve assertive communication with husband, discussed effects, developed plan with patient to maintain consistent efforts   Plan: Return again in 2 weeks.  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

## 2020-12-23 NOTE — Telephone Encounter (Signed)
See lab notes, Sheria Lang spoke with patient already

## 2020-12-23 NOTE — Telephone Encounter (Signed)
Katie Robbins called in returning her phone call. She stated that they have phone tag

## 2020-12-27 ENCOUNTER — Other Ambulatory Visit: Payer: Self-pay

## 2020-12-28 ENCOUNTER — Ambulatory Visit: Payer: 59 | Admitting: Gastroenterology

## 2020-12-28 ENCOUNTER — Encounter: Payer: Self-pay | Admitting: Gastroenterology

## 2020-12-28 ENCOUNTER — Other Ambulatory Visit: Payer: Self-pay

## 2020-12-28 VITALS — BP 168/89 | HR 90 | Temp 97.8°F | Ht 62.0 in | Wt 147.5 lb

## 2020-12-28 DIAGNOSIS — K224 Dyskinesia of esophagus: Secondary | ICD-10-CM | POA: Diagnosis not present

## 2020-12-28 MED ORDER — LIDOCAINE VISCOUS HCL 2 % MT SOLN: 30.0000 mL | Freq: Two times a day (BID) | OROMUCOSAL | 0 refills | Status: DC | PRN

## 2020-12-28 MED ORDER — DILTIAZEM HCL ER 60 MG PO CP12: 60.0000 mg | ORAL_CAPSULE | Freq: Two times a day (BID) | ORAL | 0 refills | Status: DC
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
## 2021-01-04 ENCOUNTER — Telehealth: Payer: Self-pay

## 2021-01-04 DIAGNOSIS — K224 Dyskinesia of esophagus: Secondary | ICD-10-CM

## 2021-01-04 NOTE — Telephone Encounter (Signed)
Patient states at last appointment Dr. Marius Ditch change her from Amlodipine  to Diltiazem. She states she is having no side effects to medication and feels fine on it.   FYI

## 2021-01-05 ENCOUNTER — Ambulatory Visit (INDEPENDENT_AMBULATORY_CARE_PROVIDER_SITE_OTHER): Payer: 59 | Admitting: Psychiatry

## 2021-01-05 ENCOUNTER — Other Ambulatory Visit: Payer: Self-pay

## 2021-01-05 DIAGNOSIS — S069XAS Unspecified intracranial injury with loss of consciousness status unknown, sequela: Secondary | ICD-10-CM

## 2021-01-05 DIAGNOSIS — F0630 Mood disorder due to known physiological condition, unspecified: Secondary | ICD-10-CM | POA: Diagnosis not present

## 2021-01-05 DIAGNOSIS — F410 Panic disorder [episodic paroxysmal anxiety] without agoraphobia: Secondary | ICD-10-CM

## 2021-01-05 DIAGNOSIS — S069X9S Unspecified intracranial injury with loss of consciousness of unspecified duration, sequela: Secondary | ICD-10-CM | POA: Diagnosis not present

## 2021-01-05 MED ORDER — DILTIAZEM HCL ER 60 MG PO CP12: 60.0000 mg | ORAL_CAPSULE | Freq: Two times a day (BID) | ORAL | 0 refills | Status: DC

## 2021-01-05 NOTE — Telephone Encounter (Signed)
Sent medication to the pharmacy  

## 2021-01-05 NOTE — Progress Notes (Signed)
Virtual Visit via Video Note  I connected with Katie Robbins on 01/05/21 at 2:09 PM EDby a video enabled telemedicine application and verified that I am speaking with the correct person using two identifiers.  Location: Patient: Home Provider: [place] office    I discussed the limitations of evaluation and management by telemedicine and the availability of in person appointments. The patient expressed understanding and agreed to proceed.  I provided 46 minutes of non-face-to-face time during this encounter.   [name], LCSW   THERAPIST PROGRESS NOTE  Session Time: Thursday 01/05/2021 2:09 PM - 2:55 PM  Participation Level: Active  Behavioral Response: Alert/Anxious/Talkative/tangentiality   Type of Therapy: Individual Therapy  Treatment Goals addressed:  Patient will reduce irritability and increase normal social interaction with family and friends AEB pt reducing negative comments and harsh tone of voice during interaction with husband from daily to 3 x or less per week for 3 consecutive weeks.   Interventions: CBT and Supportive  Summary: [name] is a 65 y.o. female who is referred for services for continuity of care while her previous therapist is on medical leave.  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 She reports being impatient, becoming irritated with husband easily, having difficulty understanding people's conversations, pattern of interrupting conversations, being impulsive and saying things she thinks she shouldn't say, and a pattern of avoiding places.   Patient last was seen via virtual visit about 2 weeks ago.  She reports continued improved mood and improved sleep pattern since last session.  She continues to report crying daily as she is not able to do things like she used to do as a result of her multiple medical issues.  However, she expresses increased acceptance.  She has been making  efforts to have more realistic expectations of self and problem solve as well as adjust the way she does things.  She is more optimistic about physical health issues being addressed as she feels very confident in her medical providers and the way treatment is being managed.  She expresses increased confidence in her abilities to cope with her health issues.  She reports decreased irritability and improved interaction with husband.  She reports feeling more comfortable in asking husband for help. She reports decreased negative comments to her husband.   Suicidal/Homicidal: Nowithout intent/plan  Therapist Response: reviewed symptoms, discussed stressors, facilitated expression of thoughts and feelings, validated and normalized feelings, praised and reinforced patient's increased acceptance/efforts to pace self/realistic expectations, assisted patient identify coping statements, encourage patient to maintain consistent efforts    Plan: Return again in 2 weeks.  Diagnosis: Axis I: Mood disorder as late effect of TBI, Anxiety Disorder due to general medical condition        [name], LCSW 01/05/2021

## 2021-01-05 NOTE — Telephone Encounter (Signed)
We can send in refill for 90days  RV

## 2021-01-05 NOTE — Addendum Note (Signed)
Addended by: Ulyess Blossom L on: 01/05/2021 11:55 AM   Modules accepted: Orders

## 2021-01-09 ENCOUNTER — Telehealth: Payer: Self-pay

## 2021-01-09 NOTE — Telephone Encounter (Signed)
Refill request received from [facility] for Levothyroxine '100mg'$  take one tab Sunday , Monday, Tuesday and Wednesday. Ok to refill?   Last labs states should be : 112 mcg on Thursday, Friday, Saturday and Sunday and the 100 mcg Monday Tuesday Wednesday.

## 2021-01-10 NOTE — Telephone Encounter (Signed)
I don't even see my levothyroxine 112 mcg order any longer, it looks like Ulyess Blossom, CMA deleted 112 mcg which was inappropriate.   Patient should be taking:  Levothyroxine 100 mcg on Monday, Tuesday, Wednesday (3 days weekly) Levothyroxine 112 mcg on Thursday, Friday, Sat, Sunday (4 days weekly)  See result note from 12/22/20 per Dr. Einar Pheasant who responded in my absence. Verify that she's been taking it this way first.   Also verify that she would like oxycodone, Seroquel, and Ambien CR deleted.   Let me know.

## 2021-01-12 NOTE — Telephone Encounter (Signed)
Rena, see her recent my chart message regarding symptoms. Will you triage? Please schedule her for an appointment with our office if she's not having alarm signs for chest pain indicating hospital evaluation.

## 2021-01-12 NOTE — Telephone Encounter (Signed)
I was unable to speak with pt and left v/m requesting pt to call [facility]. Unable to reach pts husband (DPR sigined) and pts work # goes to a different #. Sending note to [facility] triage and Joellen CMA.

## 2021-01-13 NOTE — Telephone Encounter (Signed)
I tried calling pt and v/m box is full and cannot leave a message; sending note to [facility] triage and joellen cma/.

## 2021-01-18 NOTE — Telephone Encounter (Signed)
Tried calling pt again and mail box is full and sending note to [facility] CMA.

## 2021-01-19 ENCOUNTER — Other Ambulatory Visit: Payer: Self-pay

## 2021-01-19 ENCOUNTER — Ambulatory Visit (INDEPENDENT_AMBULATORY_CARE_PROVIDER_SITE_OTHER): Payer: 59 | Admitting: Psychiatry

## 2021-01-19 DIAGNOSIS — F0630 Mood disorder due to known physiological condition, unspecified: Secondary | ICD-10-CM

## 2021-01-19 DIAGNOSIS — S069X9S Unspecified intracranial injury with loss of consciousness of unspecified duration, sequela: Secondary | ICD-10-CM | POA: Diagnosis not present

## 2021-01-19 DIAGNOSIS — S069XAS Unspecified intracranial injury with loss of consciousness status unknown, sequela: Secondary | ICD-10-CM

## 2021-01-19 DIAGNOSIS — F410 Panic disorder [episodic paroxysmal anxiety] without agoraphobia: Secondary | ICD-10-CM

## 2021-01-19 NOTE — Telephone Encounter (Signed)
Left message to return call to our office.  

## 2021-01-19 NOTE — Progress Notes (Signed)
Virtual Visit via Video Note  I connected with Katie Robbins on 01/19/21 at 2:06 PM EDT by a video enabled telemedicine application and verified that I am speaking with the correct person using two identifiers.  Location: Patient: Home Provider: [place] office    I discussed the limitations of evaluation and management by telemedicine and the availability of in person appointments. The patient expressed understanding and agreed to proceed.    I provided 49 minutes of non-face-to-face time during this encounter.   [name], LCSW   THERAPIST PROGRESS NOTE  Session Time: Thursday 01/19/2021 2:06 PM - 2:55 PM  Participation Level: Active  Behavioral Response: Alert/Anxious/Talkative/tangentiality   Type of Therapy: Individual Therapy  Treatment Goals addressed:  Patient will reduce irritability and increase normal social interaction with family and friends AEB pt reducing negative comments and harsh tone of voice during interaction with husband from daily to 3 x or less per week for 3 consecutive weeks.   Interventions: CBT and Supportive  Summary: Katie Robbins is a 65 y.o. female who is referred for services for continuity of care while her previous therapist is on medical leave.  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 She reports being impatient, becoming irritated with husband easily, having difficulty understanding people's conversations, pattern of interrupting conversations, being impulsive and saying things she thinks she shouldn't say, and a pattern of avoiding places.   Patient last was seen via virtual visit about 2 weeks ago.  She reports continued improved mood and improved sleep pattern since last session.  She continues to report crying daily as she is not able to do things like she used to but also expresses increased acceptance.  She reports she now is using a cane to assist her when walking  outside her home.  She also reports recently using a motorized cart at the grocery store.  However, she becomes tearful as she talks about her husband recently ordering a walker for patient.  She is thankful this can help her to do some activities that she is unable to perform now due to neuropathy in her legs.  However, she expresses sadness and disappointment she needs a walker at her age.  She states sometimes feeling lost and not knowing what to do.  She reports still experiencing poor concentration and expresses frustration regarding the impact of this.  She continues to express confidence in her medical providers and is optimistic about her conditions being treatable.  She reports continued improvement in communication in the relationship with her husband and states wanting to work on controlling her tone.     Suicidal/Homicidal: Nowithout intent/plan  Therapist Response: reviewed symptoms,  discussed stressors, facilitated expression of thoughts and feelings, validated and normalized feelings, discussed acceptance of both positive and painful feelings about needing a walker, assisted patient identify how using a walker or other devices can help her pursue activities congruent with her values, discussed the role of activity scheduling in coping with depression and feeling lost, developed plan with patient to resume use of daily planning, also discussed using activity menu to assist patient in her efforts, will send via mail   Plan: Return again in 2 weeks.  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

## 2021-01-31 ENCOUNTER — Encounter: Payer: Self-pay | Admitting: Family Medicine

## 2021-01-31 ENCOUNTER — Other Ambulatory Visit: Payer: Self-pay

## 2021-01-31 ENCOUNTER — Ambulatory Visit (INDEPENDENT_AMBULATORY_CARE_PROVIDER_SITE_OTHER): Payer: 59 | Admitting: Family Medicine

## 2021-01-31 VITALS — BP 128/70 | HR 94 | Temp 97.0°F | Ht 62.0 in | Wt 140.5 lb

## 2021-01-31 DIAGNOSIS — M722 Plantar fascial fibromatosis: Secondary | ICD-10-CM | POA: Diagnosis not present

## 2021-01-31 MED ORDER — MELOXICAM 7.5 MG PO TABS: 7.5000 mg | ORAL_TABLET | Freq: Every day | ORAL | 0 refills | Status: DC

## 2021-01-31 NOTE — Telephone Encounter (Signed)
Focused visit on foot pain which patient reported was new and 10/10.   Advise f/u prn for cp which seems to be intermittent

## 2021-01-31 NOTE — Telephone Encounter (Signed)
As noted in documentation I have not been able to reach pt. Thank you.

## 2021-01-31 NOTE — Progress Notes (Signed)
Subjective:     Katie Robbins is a 65 y.o. female presenting for Foot Pain (L the entire bottom of foot x 1 day. No known injury.  Weight bearing pain is 10/10)     Foot Injury    #Left foot pain  - no injury - has been watching movies and coloring  - husband is out of town and she has been relaxing  - did pack a suitcase - normally ambulates with cane  - in a wheelchair today due to foot pain - no swelling in the foot - work up this morning in severe pain - treatment: hot water - spa last night, 5 minutes - has not taking any pain relievers  - woke her up at night and so severe she could not bear any weight - sister notes discoloration to sole of foot which has resolved  Has [facility] for [facility]ness in her feet - which was evaluated, but no other history  Is getting evaluated for MG with Dr. Melrose Nakayama  Review of Systems   Social History   Tobacco Use  Smoking Status Never  Smokeless Tobacco Never        Objective:    BP Readings from Last 3 Encounters:  01/31/21 128/70  12/28/20 (!) 168/89  12/17/20 (!) 152/73   Wt Readings from Last 3 Encounters:  01/31/21 140 lb 8 oz (63.7 kg)  12/28/20 147 lb 8 oz (66.9 kg)  12/09/20 147 lb (66.7 kg)    BP 128/70   Pulse 94   Temp (!) 97 F (36.1 C) (Temporal)   Ht 5\' 2"  (1.575 m)   Wt 140 lb 8 oz (63.7 kg)   SpO2 99%   BMI 25.70 kg/m    Physical Exam Constitutional:      General: She is not in acute distress.    Appearance: She is well-developed. She is not diaphoretic.  HENT:     Right Ear: External ear normal.     Left Ear: External ear normal.  Eyes:     Conjunctiva/sclera: Conjunctivae normal.  Cardiovascular:     Rate and Rhythm: Normal rate.  Pulmonary:     Effort: Pulmonary effort is normal.  Musculoskeletal:     Cervical back: Neck supple.     Comments: Foot:  Inspection: no swelling, erythema, bruising ROM: normal but pain with toe dorsiflexion/plantar flexion Palpation: TTP along the  plantar fascia and heel with some arch pain, no ankle or bony pain, no pain on ball of foot Strength: normal Pain in the heel with resisted dorsiflexion  Skin:    General: Skin is warm and dry.     Capillary Refill: Capillary refill takes less than 2 seconds.  Neurological:     Mental Status: She is alert. Mental status is at baseline.  Psychiatric:        Mood and Affect: Mood normal.        Behavior: Behavior normal.          Assessment & Plan:   Problem List Items Addressed This Visit       Musculoskeletal and Integument   Plantar fasciitis, left - Primary    Exam consistent with plantar fasciitis. Advised getting supportive insert in shoes, trial of meloxicam and home exercises. Has seen podiatry in the past - advised f/u if not improving.       Relevant Medications   meloxicam (MOBIC) 7.5 MG tablet   I spent >25 minutes with pt , obtaining history, examining, reviewing  chart, documenting encounter and discussing the above plan of care.   Return if symptoms worsen or fail to improve.  Lesleigh Noe, MD  This visit occurred during the SARS-CoV-2 public health emergency.  Safety protocols were in place, including screening questions prior to the visit, additional usage of staff PPE, and extensive cleaning of exam room while observing appropriate contact time as indicated for disinfecting solutions.

## 2021-01-31 NOTE — Telephone Encounter (Signed)
Spoke with patient. Patient is taking Levothyroxine as stated below.   Yes patient would like the 3 medications mentioned below to be deleted.

## 2021-01-31 NOTE — Telephone Encounter (Addendum)
I spoke with pt; pt said on and off has CP; last CP was this morning on lt side of chest that was a sharpe pain that lasted for at least 1 hr. Pt said sometimes can take valium, calm down and pain goes away. Pt always has SOB and her SOB is no worse than usual.Pt is stressed about her foot and pts husband is out of town; pt already has appt today to see Dr Einar Pheasant. Gentry Fitz NP also had appt for this afternoon but pt said was too far to go. Pt will speak with Dr Einar Pheasant today also about CP and foot since pt said she does not need to go to [facility] or ED. ED precautions given and pt voiced understanding. Will send message to Dr Babs Sciara NP, and will speak with [facility] CMA.

## 2021-01-31 NOTE — Telephone Encounter (Signed)
Patient has an appointment with Dr Einar Pheasant today at 4 pm for foot pain if need to speak with her then. Thank you

## 2021-01-31 NOTE — Assessment & Plan Note (Signed)
Exam consistent with plantar fasciitis. Advised getting supportive insert in shoes, trial of meloxicam and home exercises. Has seen podiatry in the past - advised f/u if not improving.

## 2021-01-31 NOTE — Patient Instructions (Addendum)
Start Meloxicam once daily (take with food) After 3 days, start trying to do exercises below  -- can also in chair, roll your foot on a tennis ball    Plantar Fasciitis Rehab Ask your health care provider which exercises are safe for you. Do exercises exactly as told by your health care provider and adjust them as directed. It is normal to feel mild stretching, pulling, tightness, or discomfort as you do these exercises. Stop right away if you feel sudden pain or your pain gets worse. Do not begin these exercises until told by your health care provider. Stretching and range-of-motion exercises These exercises warm up your muscles and joints and improve the movement and flexibility of your foot. These exercises also help to relieve pain. Plantar fascia stretch  Sit with your left / right leg crossed over your opposite knee. Hold your heel with one hand with that thumb near your arch. With your other hand, hold your toes and gently pull them back toward the top of your foot. You should feel a stretch on the base (bottom) of your toes, or the bottom of your foot (plantar fascia), or both. Hold this stretch for__________ seconds. Slowly release your toes and return to the starting position. Repeat __________ times. Complete this exercise __________ times a day. Gastrocnemius stretch, standing This exercise is also called a calf (gastroc) stretch. It stretches the muscles in the back of the upper calf. Stand with your hands against a wall. Extend your left / right leg behind you, and bend your front knee slightly. Keeping your heels on the floor, your toes facing forward, and your back knee straight, shift your weight toward the wall. Do not arch your back. You should feel a gentle stretch in your upper calf. Hold this position for __________ seconds. Repeat __________ times. Complete this exercise __________ times a day. Soleus stretch, standing This exercise is also called a calf (soleus)  stretch. It stretches the muscles in the back of the lower calf. Stand with your hands against a wall. Extend your left / right leg behind you, and bend your front knee slightly. Keeping your heels on the floor and your toes facing forward, bend your back knee and shift your weight slightly over your back leg. You should feel a gentle stretch deep in your lower calf. Hold this position for __________ seconds. Repeat __________ times. Complete this exercise __________ times a day. Gastroc and soleus stretch, standing step This exercise stretches the muscles in the back of the lower leg. These muscles are in the upper calf (gastrocnemius) and the lower calf (soleus). Stand with the ball of your left / right foot on the front of a step. The ball of your foot is on the walking surface, right under your toes. Keep your other foot firmly on the same step. Hold on to the wall or a railing for balance. Slowly lift your other foot, allowing your body weight to press your heel down over the edge of the front of the step. Keep knee straight and unbent. You should feel a stretch in your calf. Hold this position for __________ seconds. Return both feet to the step. Repeat this exercise with a slight bend in your left / right knee. Repeat __________ times with your left / right knee straight and __________ times with your left / right knee bent. Complete this exercise __________ times a day. Balance exercise This exercise builds your balance and strength control of your arch to help take pressure  off your plantar fascia. Single leg stand If this exercise is too easy, you can try it with your eyes closed or while standing on a pillow. Without shoes, stand near a railing or in a doorway. You may hold on to the railing or door frame as needed. Stand on your left / right foot. Keep your big toe down on the floor and lift the arch of your foot. You should feel a stretch across the bottom of your foot and your  arch. Do not let your foot roll inward. Hold this position for __________ seconds. Repeat __________ times. Complete this exercise __________ times a day. This information is not intended to replace advice given to you by your health care provider. Make sure you discuss any questions you have with your health care provider. Document Revised: 02/11/2020 Document Reviewed: 02/11/2020 Elsevier Patient Education  2022 [employer].  Plantar Fasciitis Plantar fasciitis is a painful foot condition that affects the heel. It occurs when the band of tissue that connects the toes to the heel bone (plantar fascia) becomes irritated. This can happen as the result of exercising too much or doing other repetitive activities (overuse injury). Plantar fasciitis can cause mild irritation to severe pain that makes it difficult to walk or move. The pain is usually worse in the morning after sleeping, or after sitting or lying down for a period of time. Pain may also be worse after long periods of walking or standing. What are the causes? This condition may be caused by: Standing for long periods of time. Wearing shoes that do not have good arch support. Doing activities that put stress on joints (high-impact activities). This includes ballet and exercise that makes your heart beat faster (aerobic exercise), such as running. Being overweight. An abnormal way of walking (gait). Tight muscles in the back of your lower leg (calf). High arches in your feet or flat feet. Starting a new athletic activity. What are the signs or symptoms? The main symptom of this condition is heel pain. Pain may get worse after the following: Taking the first steps after a time of rest, especially in the morning after awakening, or after you have been sitting or lying down for a while. Long periods of standing still. Pain may decrease after 30-45 minutes of activity, such as gentle walking. How is this diagnosed? This condition may  be diagnosed based on your medical history, a physical exam, and your symptoms. Your health care provider will check for: A tender area on the bottom of your foot. A high arch in your foot or flat feet. Pain when you move your foot. Difficulty moving your foot. You may have imaging tests to confirm the diagnosis, such as: X-rays. Ultrasound. MRI. How is this treated? Treatment for plantar fasciitis depends on how severe your condition is. Treatment may include: Rest, ice, pressure (compression), and raising (elevating) the affected foot. This is called RICE therapy. Your health care provider may recommend RICE therapy along with over-the-counter pain medicines to manage your pain. Exercises to stretch your calves and your plantar fascia. A splint that holds your foot in a stretched, upward position while you sleep (night splint). Physical therapy to relieve symptoms and prevent problems in the future. Injections of steroid medicine (cortisone) to relieve pain and inflammation. Stimulating your plantar fascia with electrical impulses (extracorporeal shock wave therapy). This is usually the last treatment option before surgery. Surgery, if other treatments have not worked after 12 months. Follow these instructions at home: Managing  pain, stiffness, and swelling  If directed, put ice on the painful area. To do this: Put ice in a plastic bag, or use a frozen bottle of water. Place a towel between your skin and the bag or bottle. Roll the bottom of your foot over the bag or bottle. Do this for 20 minutes, 2-3 times a day. Wear athletic shoes that have air-sole or gel-sole cushions, or try soft shoe inserts that are designed for plantar fasciitis. Elevate your foot above the level of your heart while you are sitting or lying down. Activity Avoid activities that cause pain. Ask your health care provider what activities are safe for you. Do physical therapy exercises and stretches as told by  your health care provider. Try activities and forms of exercise that are easier on your joints (low impact). Examples include swimming, water aerobics, and biking. General instructions Take over-the-counter and prescription medicines only as told by your health care provider. Wear a night splint while sleeping, if told by your health care provider. Loosen the splint if your toes tingle, become numb, or turn cold and blue. Maintain a healthy weight, or work with your health care provider to lose weight as needed. Keep all follow-up visits. This is important. Contact a health care provider if you have: Symptoms that do not go away with home treatment. Pain that gets worse. Pain that affects your ability to move or do daily activities. Summary Plantar fasciitis is a painful foot condition that affects the heel. It occurs when the band of tissue that connects the toes to the heel bone (plantar fascia) becomes irritated. Heel pain is the main symptom of this condition. It may get worse after exercising too much or standing still for a long time. Treatment varies, but it usually starts with rest, ice, pressure (compression), and raising (elevating) the affected foot. This is called RICE therapy. Over-the-counter medicines can also be used to manage pain. This information is not intended to replace advice given to you by your health care provider. Make sure you discuss any questions you have with your health care provider. Document Revised: 08/17/2019 Document Reviewed: 08/17/2019 Elsevier Patient Education  2022 [employer].

## 2021-01-31 NOTE — Telephone Encounter (Signed)
Noted  

## 2021-01-31 NOTE — Telephone Encounter (Signed)
Noted, changes made. Re-added levothyroxine 112 mcg that should never have been deleted.

## 2021-02-01 DIAGNOSIS — E039 Hypothyroidism, unspecified: Secondary | ICD-10-CM

## 2021-02-01 DIAGNOSIS — Z79890 Hormone replacement therapy (postmenopausal): Secondary | ICD-10-CM

## 2021-02-02 ENCOUNTER — Other Ambulatory Visit: Payer: Self-pay

## 2021-02-02 ENCOUNTER — Ambulatory Visit (INDEPENDENT_AMBULATORY_CARE_PROVIDER_SITE_OTHER): Payer: 59 | Admitting: Psychiatry

## 2021-02-02 DIAGNOSIS — F0630 Mood disorder due to known physiological condition, unspecified: Secondary | ICD-10-CM | POA: Diagnosis not present

## 2021-02-02 DIAGNOSIS — S069X9S Unspecified intracranial injury with loss of consciousness of unspecified duration, sequela: Secondary | ICD-10-CM

## 2021-02-02 DIAGNOSIS — F410 Panic disorder [episodic paroxysmal anxiety] without agoraphobia: Secondary | ICD-10-CM

## 2021-02-02 DIAGNOSIS — S069XAS Unspecified intracranial injury with loss of consciousness status unknown, sequela: Secondary | ICD-10-CM

## 2021-02-02 IMAGING — DX DG CHEST 2V
2 series · 2 of 2 positions shown · non-contrast
Comparison: 01/14/2018

CLINICAL DATA: Dyspnea

EXAM:
CHEST - 2 VIEW

[chest pa]
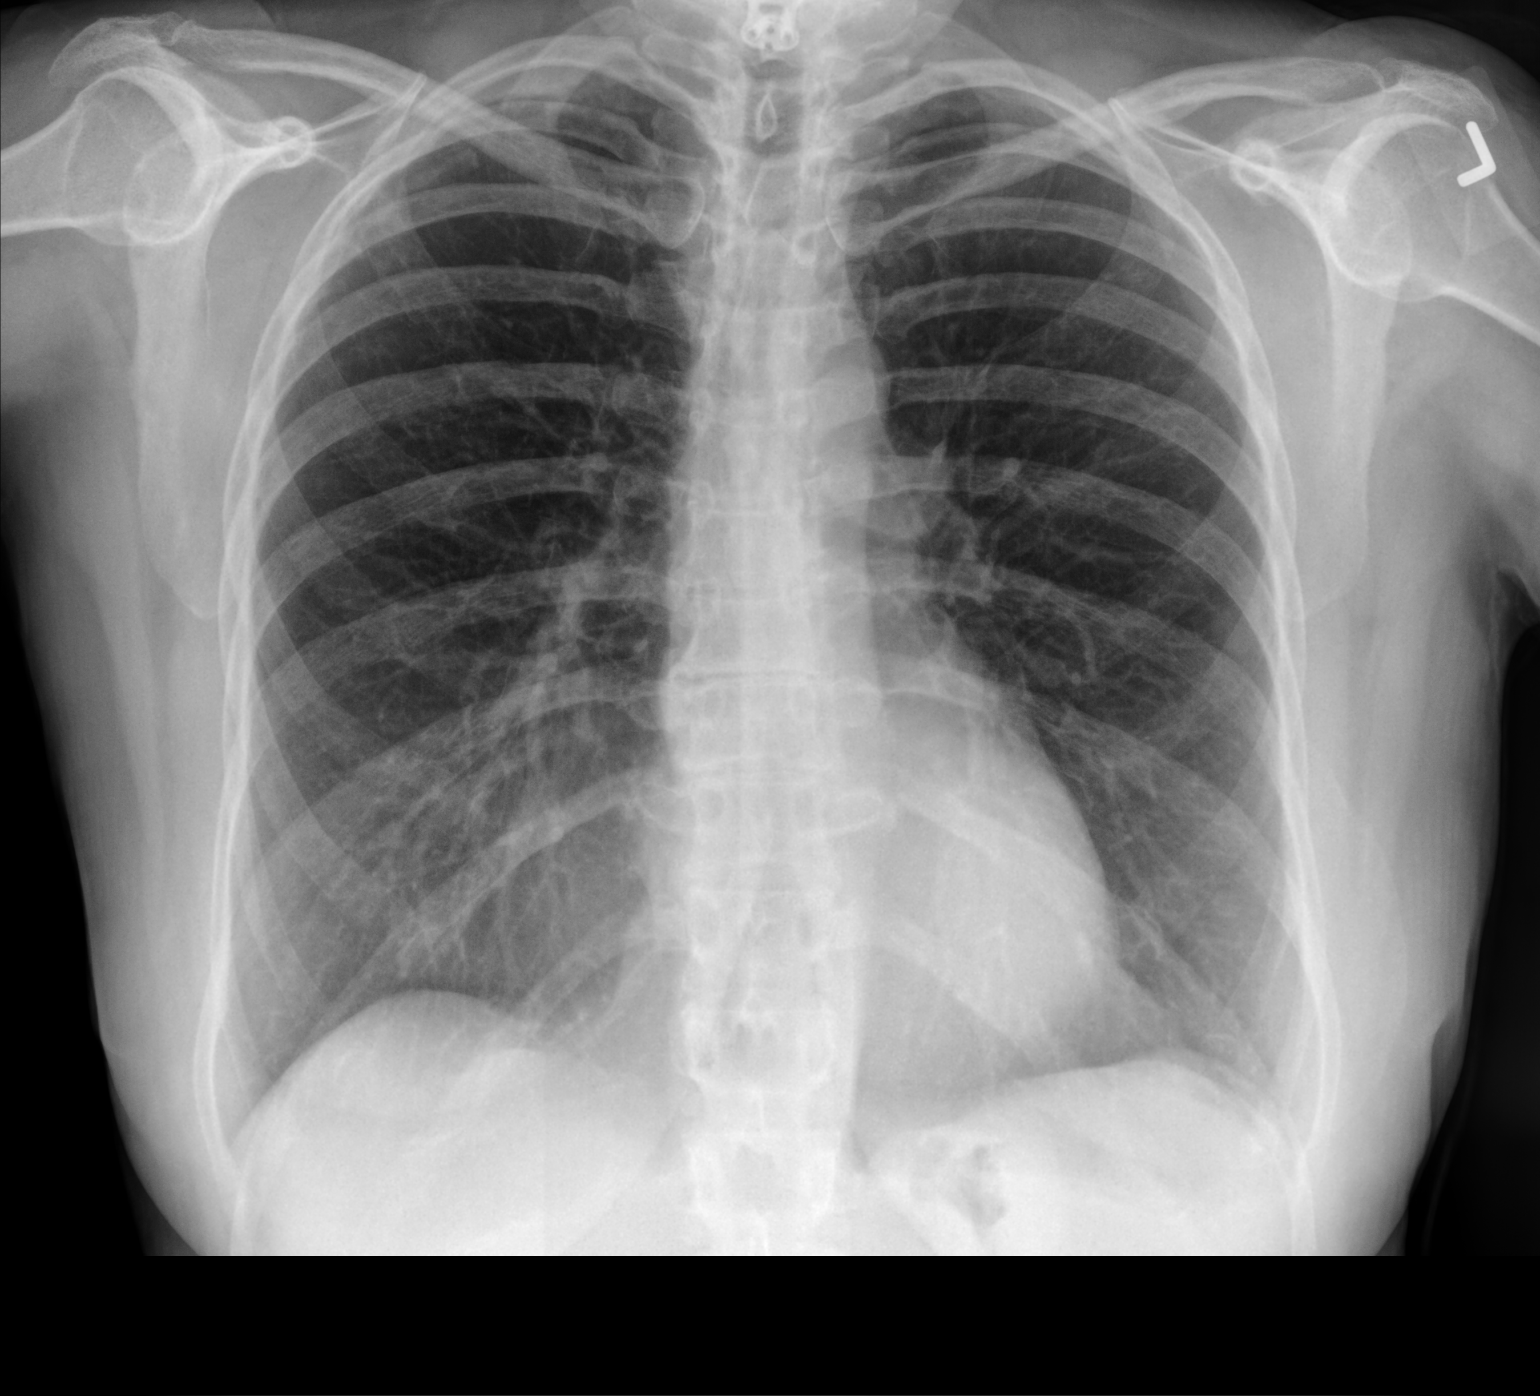

[chest lat]
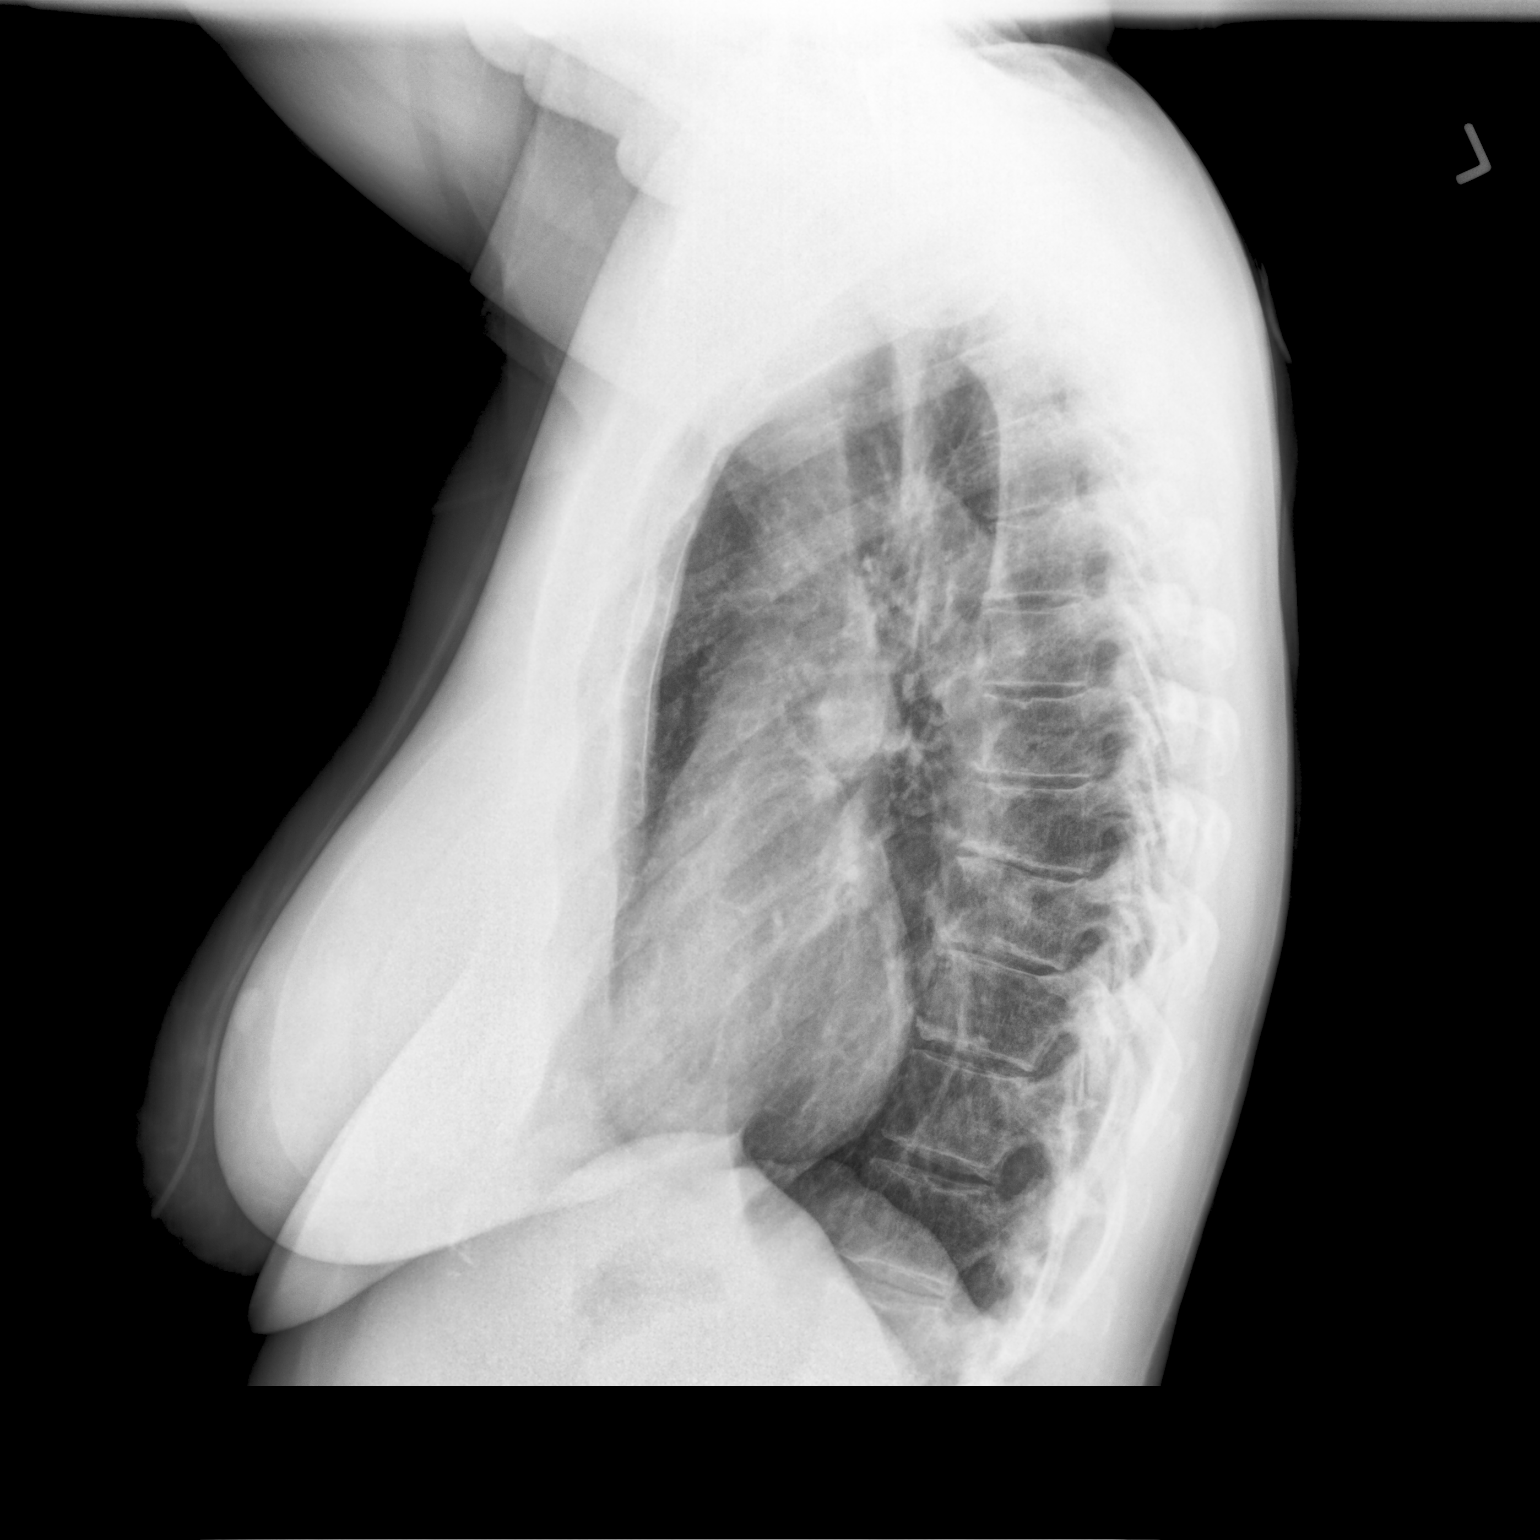

[2 of 2 positions shown; findings below may reference images not displayed]

FINDINGS: The heart size and mediastinal contours are within normal limits.
Both lungs are clear. The visualized skeletal structures are
unremarkable.
IMPRESSION: No active cardiopulmonary disease.

## 2021-02-02 NOTE — Progress Notes (Signed)
Virtual Visit via Video Note  I connected with Katie Robbins on 02/02/21 at 1:10 PM EDT by a video enabled telemedicine application and verified that I am speaking with the correct person using two identifiers.  Location: Patient: Home Provider: [place] office    I discussed the limitations of evaluation and management by telemedicine and the availability of in person appointments. The patient expressed understanding and agreed to proceed.   I provided 46 minutes of non-face-to-face time during this encounter.   [name], LCSW    THERAPIST PROGRESS NOTE  Session Time: Thursday 02/02/2021 1:10 PM 1:56 PM   Participation Level: Active  Behavioral Response: Alert/Anxious/Talkative/tangentiality   Type of Therapy: Individual Therapy  Treatment Goals addressed:  Patient will reduce irritability and increase normal social interaction with family and friends AEB pt reducing negative comments and harsh tone of voice during interaction with husband from daily to 3 x or less per week for 3 consecutive weeks.   Interventions: CBT and Supportive  Summary: Katie Robbins is a 64 y.o. female who is referred for services for continuity of care while her previous therapist is on medical leave.  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 She reports being impatient, becoming irritated with husband easily, having difficulty understanding people's conversations, pattern of interrupting conversations, being impulsive and saying things she thinks she shouldn't say, and a pattern of avoiding places.   Patient last was seen via virtual visit about 2 weeks ago.  She reports continued improved mood and improved sleep pattern since last session.  She reports decreased crying Robbins and says she has not cried any this week.  She expresses acceptance of her need for assistance from a walking aid.  She now is using her walker inside her home  and reports it has been very helpful in helping her maintain mobility and independence.  She reports increased involvement in pleasurable activities like being outside in her yard, being in the pool, sitting on her deck, doing adult coloring, and watching movies.  She has been using the activity menu and reports encouragement from her sister-in-law who has been staying with patient for the last week while patient's husband is out of town.  Patient also is pleased she has more realistic expectations of self and is able to accept help from her sister-in-law.  Patient reports being less perfectionistic regarding household tasks. She reports continued improvement in communication with husband.  She continues to have medical issues and is scheduled for 1 medical test next week at [facility].    Suicidal/Homicidal: Nowithout intent/plan  Therapist Response: reviewed symptoms,  discussed stressors, facilitated expression of thoughts and feelings, validated and normalized feelings, praised and reinforced patient's acceptance of current condition, gust effects, praised and reinforced patient's use of daily planning, assisted patient identify effects on her mood and her thoughts, assisted patient identify ways to maintain consistency regarding use of daily planning, assisted patient identify ways to increase social involvement, patient agreed to implement strategies discussed in session    Plan: Return again in 2 weeks.  Diagnosis: Axis I: Mood disorder as late effect of TBI, Anxiety Disorder due to general medical condition        [name], LCSW 02/02/2021

## 2021-02-07 ENCOUNTER — Inpatient Hospital Stay
Admission: RE | Admit: 2021-02-07 | Discharge: 2021-02-07 | Disposition: A | Discharge status: Home / Self Care | Payer: Self-pay | Source: Ambulatory Visit | Attending: *Deleted | Admitting: *Deleted

## 2021-02-07 ENCOUNTER — Other Ambulatory Visit: Payer: Self-pay | Admitting: *Deleted

## 2021-02-07 ENCOUNTER — Ambulatory Visit
Admission: RE | Admit: 2021-02-07 | Discharge: 2021-02-07 | Disposition: A | Discharge status: Home / Self Care | Payer: 59 | Source: Ambulatory Visit | Attending: Primary Care | Admitting: Primary Care

## 2021-02-07 ENCOUNTER — Other Ambulatory Visit: Payer: Self-pay

## 2021-02-07 DIAGNOSIS — Z1231 Encounter for screening mammogram for malignant neoplasm of breast: Secondary | ICD-10-CM

## 2021-02-07 DIAGNOSIS — E2839 Other primary ovarian failure: Secondary | ICD-10-CM | POA: Insufficient documentation

## 2021-02-10 ENCOUNTER — Other Ambulatory Visit: Payer: Self-pay

## 2021-02-10 DIAGNOSIS — E039 Hypothyroidism, unspecified: Secondary | ICD-10-CM

## 2021-02-10 MED ORDER — LEVOTHYROXINE SODIUM 100 MCG PO TABS: ORAL_TABLET | ORAL | 0 refills | Status: DC

## 2021-02-10 NOTE — Telephone Encounter (Signed)
This patient needs a repeat TSH scheduled for now. Lab order placed.

## 2021-02-13 NOTE — Telephone Encounter (Signed)
Called and set up labs.

## 2021-02-13 NOTE — Telephone Encounter (Signed)
Left message to return call to our office.  

## 2021-02-14 ENCOUNTER — Other Ambulatory Visit (INDEPENDENT_AMBULATORY_CARE_PROVIDER_SITE_OTHER): Payer: 59

## 2021-02-14 ENCOUNTER — Other Ambulatory Visit: Payer: Self-pay

## 2021-02-14 DIAGNOSIS — E039 Hypothyroidism, unspecified: Secondary | ICD-10-CM | POA: Diagnosis not present

## 2021-02-14 LAB — TSH: TSH: 0.52 u[IU]/mL (ref 0.35–5.50)

## 2021-02-16 ENCOUNTER — Other Ambulatory Visit: Payer: Self-pay

## 2021-02-16 ENCOUNTER — Ambulatory Visit (INDEPENDENT_AMBULATORY_CARE_PROVIDER_SITE_OTHER): Payer: 59 | Admitting: Psychiatry

## 2021-02-16 DIAGNOSIS — S069X9S Unspecified intracranial injury with loss of consciousness of unspecified duration, sequela: Secondary | ICD-10-CM

## 2021-02-16 DIAGNOSIS — F0630 Mood disorder due to known physiological condition, unspecified: Secondary | ICD-10-CM

## 2021-02-16 DIAGNOSIS — F410 Panic disorder [episodic paroxysmal anxiety] without agoraphobia: Secondary | ICD-10-CM

## 2021-02-16 DIAGNOSIS — S069XAS Unspecified intracranial injury with loss of consciousness status unknown, sequela: Secondary | ICD-10-CM | POA: Diagnosis not present

## 2021-02-16 NOTE — Progress Notes (Signed)
Virtual Visit via Video Note  I connected with [name] on 02/16/21 at 4:15 PM EDT  by a video enabled telemedicine application and verified that I am speaking with the correct person using two identifiers.  Location: Patient:  Home Provider: [place] office    I discussed the limitations of evaluation and management by telemedicine and the availability of in person appointments. The patient expressed understanding and agreed to proceed.   I provided 50 minutes of non-face-to-face time during this encounter.   [name], LCSW   THERAPIST PROGRESS NOTE  Session Time: Thursday 02/16/2021 4:15 PM - 5:05 PM   Participation Level: Active  Behavioral Response: Alert/Anxious/Talkative/tangentiality   Type of Therapy: Individual Therapy  Treatment Goals addressed:  Patient will reduce irritability and increase normal social interaction with family and friends AEB pt reducing negative comments and harsh tone of voice during interaction with husband from daily to 3 x or less per week for 3 consecutive weeks.   Interventions: CBT and Supportive  Summary: Deasiah Hagberg is a 65 y.o. female who is referred for services for continuity of care while her previous therapist is on medical leave.  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 She reports being impatient, becoming irritated with husband easily, having difficulty understanding people's conversations, pattern of interrupting conversations, being impulsive and saying things she thinks she shouldn't say, and a pattern of avoiding places.   Patient last was seen via virtual visit about 2 weeks ago.  She reports continued improved mood and improved sleep pattern since last session.  She reports implementing daily planning consistently and has increased behavioral activation.  She continues to engage in pleasurable activities at home and perform light household tasks.  She  also reports she has started cooking more frequently and is very pleased about this.  She also reports being able to take care of business/financial matters.  She reports having to pace herself but being pleased with her accomplishments.  She reports continued improved interaction/communication with husband and denies making any negative comments or using a harsh tone since last session.  Patient continues to miss being like she was prior to the accident but expresses increased acceptance of her current condition.  She is pleased with her progress but also is concerned that she does not feel comfortable leaving her home.  She worries about her leg strength.  Suicidal/Homicidal: Nowithout intent/plan  Therapist Response: reviewed symptoms,  discussed stressors, facilitated expression of thoughts and feelings, validated and normalized feelings, praised and reinforced patient's acceptance of current condition,praised and reinforced patient's use of daily planning, develop plan with patient to continue using daily planning, also agreed to discuss next steps for treatment at next session.    Plan: Return again in 2 weeks.  Diagnosis: Axis I: Mood disorder as late effect of TBI, Anxiety Disorder due to general medical condition        [name], LCSW 02/16/2021

## 2021-03-02 ENCOUNTER — Ambulatory Visit (INDEPENDENT_AMBULATORY_CARE_PROVIDER_SITE_OTHER): Payer: 59 | Admitting: Psychiatry

## 2021-03-02 ENCOUNTER — Other Ambulatory Visit: Payer: Self-pay

## 2021-03-02 DIAGNOSIS — F410 Panic disorder [episodic paroxysmal anxiety] without agoraphobia: Secondary | ICD-10-CM

## 2021-03-02 DIAGNOSIS — F0630 Mood disorder due to known physiological condition, unspecified: Secondary | ICD-10-CM | POA: Diagnosis not present

## 2021-03-02 DIAGNOSIS — S069XAS Unspecified intracranial injury with loss of consciousness status unknown, sequela: Secondary | ICD-10-CM | POA: Diagnosis not present

## 2021-03-02 NOTE — Progress Notes (Signed)
Virtual Visit via Video Note  I connected with Katie Robbins on 03/02/21 at 2:19 PM EDT  by a video enabled telemedicine application and verified that I am speaking with the correct person using two identifiers.  Location: Patient: Home Provider: [place] office    I discussed the limitations of evaluation and management by telemedicine and the availability of in person appointments. The patient expressed understanding and agreed to proceed.  I provided 41 minutes of non-face-to-face time during this encounter.   [name], LCSW  THERAPIST PROGRESS NOTE  Session Time: Thursday 03/02/2021 2:19 PM - 3:00 PM   Participation Level: Active  Behavioral Response: Alert/Anxious/Talkative/tangentiality   Type of Therapy: Individual Therapy  Treatment Goals addressed:  Patient will reduce irritability and increase normal social interaction with family and friends AEB pt reducing negative comments and harsh tone of voice during interaction with husband from daily to 3 x or less per week for 3 consecutive weeks.   Interventions: CBT and Supportive  Summary: Katie Robbins is a 65 y.o. female who is referred for services for continuity of care while her previous therapist is on medical leave.  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 She reports being impatient, becoming irritated with husband easily, having difficulty understanding people's conversations, pattern of interrupting conversations, being impulsive and saying things she thinks she shouldn't say, and a pattern of avoiding places.   Patient last was seen via virtual visit about 2 weeks ago.  She reports continued improved mood since last session.  She is very excited as her daughter and grandchildren who reside in [place] have been visiting patient for the past week and a half.  They will be returning home tomorrow.  She reports being very active and engaged during her  daughter's visit.  She reports going to Dr. Clelia Schaumann as well as going shopping with her daughter and grandchildren.  She reports experiencing some anxiety about going outside her home but managed by using her medication and distracting activities.  She expresses sadness about daughter and grandchildren living.  She fears she may regress regarding her involvement in activity.  Patient expresses desire to start going outside of her home on a more regular basis.  She also expresses a desire to drive places alone.  She also states wanting to work on avoiding interrupting conversations.  .  Suicidal/Homicidal: Nowithout intent/plan  Therapist Response: reviewed symptoms, praised and reinforced patient's increased involvement in activities and going outside of her home, discussed effects on her thoughts/mood/behavior, developed plan with patient to resume use of activity menu and activity scheduling once daughter leaves, discussed stressors, facilitated expression of thoughts and feelings, validated feelings,began to discuss next steps for treatment and behavioral targets    Plan: Return again in 2 weeks.  Diagnosis: Axis I: Mood disorder as late effect of TBI, Anxiety Disorder due to general medical condition        [name], LCSW 03/02/2021

## 2021-03-06 ENCOUNTER — Encounter: Payer: Self-pay | Admitting: Obstetrics and Gynecology

## 2021-03-10 ENCOUNTER — Telehealth: Payer: Self-pay | Admitting: Primary Care

## 2021-03-10 DIAGNOSIS — E039 Hypothyroidism, unspecified: Secondary | ICD-10-CM

## 2021-03-10 MED ORDER — LEVOTHYROXINE SODIUM 112 MCG PO TABS: ORAL_TABLET | ORAL | 0 refills | Status: DC

## 2021-03-10 NOTE — Telephone Encounter (Signed)
Called patient she needs refill on the 63 mgc she is taking 4 days a week. She has not received call from endo referral so I am going to send her a my chart message with contact number. She will call for appointment.

## 2021-03-10 NOTE — Telephone Encounter (Signed)
Refills sent to pharmacy. 

## 2021-03-10 NOTE — Telephone Encounter (Signed)
  Encourage patient to contact the pharmacy for refills or they can request refills through [place]:  Please schedule appointment if longer than 1 year  NEXT APPOINTMENT DATE:no future appt  MEDICATION:levothyroxine (SYNTHROID) 112 MCG tablet  Is the patient out of medication? 1 pill left  [place], [place] - [place]  Let patient know to contact pharmacy at the end of the day to make sure medication is ready.  Please notify patient to allow 48-72 hours to process  CLINICAL FILLS OUT ALL BELOW:   LAST REFILL:  QTY:  REFILL DATE:    OTHER COMMENTS:    Okay for refill?  Please advise

## 2021-03-10 NOTE — Addendum Note (Signed)
Addended by: Pleas Koch on: 03/10/2021 02:08 PM   Modules accepted: Orders

## 2021-03-15 ENCOUNTER — Other Ambulatory Visit: Payer: Self-pay | Admitting: Primary Care

## 2021-03-15 DIAGNOSIS — I10 Essential (primary) hypertension: Secondary | ICD-10-CM

## 2021-03-15 DIAGNOSIS — K219 Gastro-esophageal reflux disease without esophagitis: Secondary | ICD-10-CM

## 2021-03-16 ENCOUNTER — Telehealth: Payer: Self-pay

## 2021-03-16 ENCOUNTER — Other Ambulatory Visit: Payer: Self-pay

## 2021-03-16 DIAGNOSIS — K219 Gastro-esophageal reflux disease without esophagitis: Secondary | ICD-10-CM

## 2021-03-16 MED ORDER — PANTOPRAZOLE SODIUM 40 MG PO TBEC: 40.0000 mg | DELAYED_RELEASE_TABLET | Freq: Every day | ORAL | 3 refills | Status: DC

## 2021-03-16 NOTE — Telephone Encounter (Signed)
Yes protonix 40mg  daily before meals, 90 days and 4 refills  RV

## 2021-03-16 NOTE — Telephone Encounter (Signed)
Tried calling the patient and he did not answer. LVM for patient to call back.   

## 2021-03-16 NOTE — Telephone Encounter (Signed)
Refills sent to pharmacy. 

## 2021-03-16 NOTE — Telephone Encounter (Signed)
Will you check with patient to see how often she's taking olmesartan BP medication? Once daily or twice daily?

## 2021-03-16 NOTE — Telephone Encounter (Signed)
Pt returning call

## 2021-03-16 NOTE — Telephone Encounter (Signed)
Called the patient back and she informed me she is taking her olmesartan 2 times a day. Please advise.

## 2021-03-16 NOTE — Telephone Encounter (Signed)
Prescription for Pantoprazole 40mg  daily sent to pt's pharmacy. Pt notified.

## 2021-03-16 NOTE — Telephone Encounter (Signed)
Pt requesting refill on her Pantoprazole 40mg . Please approve and I will send it over to her pharmacy.  Thanks!

## 2021-03-23 ENCOUNTER — Ambulatory Visit (HOSPITAL_COMMUNITY): Payer: 59 | Admitting: Psychiatry

## 2021-04-05 ENCOUNTER — Encounter (HOSPITAL_COMMUNITY): Payer: Self-pay

## 2021-04-05 ENCOUNTER — Ambulatory Visit (HOSPITAL_COMMUNITY): Payer: 59 | Admitting: Psychiatry

## 2021-04-13 DIAGNOSIS — E039 Hypothyroidism, unspecified: Secondary | ICD-10-CM

## 2021-04-19 ENCOUNTER — Ambulatory Visit (INDEPENDENT_AMBULATORY_CARE_PROVIDER_SITE_OTHER): Payer: 59 | Admitting: Psychiatry

## 2021-04-19 ENCOUNTER — Other Ambulatory Visit: Payer: Self-pay

## 2021-04-19 DIAGNOSIS — S069XAS Unspecified intracranial injury with loss of consciousness status unknown, sequela: Secondary | ICD-10-CM | POA: Diagnosis not present

## 2021-04-19 DIAGNOSIS — F0630 Mood disorder due to known physiological condition, unspecified: Secondary | ICD-10-CM | POA: Diagnosis not present

## 2021-04-19 NOTE — Progress Notes (Signed)
Virtual Visit via Video Note  I connected with Katie Robbins on 04/19/21 at  2:00 PM EST by a video enabled telemedicine application and verified that I am speaking with the correct person using two identifiers.  Location: Patient: Home Provider: [place] office    I discussed the limitations of evaluation and management by telemedicine and the availability of in person appointments. The patient expressed understanding and agreed to proceed.   I provided 50 minutes of non-face-to-face time during this encounter.   [name], LCSW   THERAPIST PROGRESS NOTE  Session Time: Wednesday 03/20/2021 2:05 PM  - 2:55 PM   Participation Level: Active  Behavioral Response: Alert/Anxious/Talkative/tangentiality   Type of Therapy: Individual Therapy  Treatment Goals addressed:  Patient will reduce irritability and increase normal social interaction with family and friends AEB pt reducing negative comments and harsh tone of voice during interaction with husband from daily to 3 x or less per week for 3 consecutive weeks.   Interventions: CBT and Supportive  Summary: Katie Robbins is a 65 y.o. female who is referred for services for continuity of care while her previous therapist is on medical leave.  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 She reports being impatient, becoming irritated with husband easily, having difficulty understanding people's conversations, pattern of interrupting conversations, being impulsive and saying things she thinks she shouldn't say, and a pattern of avoiding places.   Patient last was seen via virtual visit about 6 weeks ago.  She reports doing well overall since last session.  She enjoyed celebrating Thanksgiving and has been decorating her home for Christmas.  She and her husband went to a friend's house for Thanksgiving dinner.  Patient also reports she has been cleaning out her Christmas closet,  organizing items, and has made several trips along to the [place] to make donations.  She also reports going out to lunch once by herself on her way back from [place].  She reports still becoming anxious sometimes when driving especially if she has made more than 1 stop while out.  She reports she panics but uses deep breathing to manage the trip home.  She then takes the Valium once home and tries to do something relaxing like watch television or play a game.  She reports she has not taking Abilify for several weeks and has informed her psychiatrist.  Per patient's report, she feels better just taking Valium and Adderall as she does not feel as foggy.  She has noticed she has begun to experience some sadness in the last couple of days.  She attributes some of this to perhaps finishing 2 of her projects and now not knowing what other activities to pursue.  She also expresses some fear of what she will do once the holidays are over.    Suicidal/Homicidal: Nowithout intent/plan  Therapist Response: reviewed symptoms, praised and reinforced patient's involvement in activities and going outside of her home, discussed effects on her thoughts/mood/behavior, discussed stressors, facilitated expression of thoughts and feelings, validated feelings, developed plan with patient to resume use of activity menu, also began to explore other activities to pursue consistent with patient's values, explored ways to increase social involvement/interaction, develop plan with patient to go to lunch with a friend or to go to lunch at her favorite restaurant by herself 1 time by next session   Plan: Return again in 2 weeks.  Diagnosis: Axis I: Mood disorder as late  effect of TBI, Anxiety Disorder due to general medical condition        [name], LCSW 04/19/2021

## 2021-04-27 ENCOUNTER — Other Ambulatory Visit (HOSPITAL_COMMUNITY)
Admission: RE | Admit: 2021-04-27 | Discharge: 2021-04-27 | Disposition: A | Discharge status: Home / Self Care | Payer: 59 | Source: Ambulatory Visit | Attending: Obstetrics and Gynecology | Admitting: Obstetrics and Gynecology

## 2021-04-27 ENCOUNTER — Other Ambulatory Visit: Payer: Self-pay

## 2021-04-27 ENCOUNTER — Encounter: Payer: Self-pay | Admitting: Obstetrics and Gynecology

## 2021-04-27 ENCOUNTER — Ambulatory Visit: Payer: 59 | Admitting: Obstetrics and Gynecology

## 2021-04-27 VITALS — BP 127/75 | Ht 63.0 in | Wt 135.0 lb

## 2021-04-27 DIAGNOSIS — N898 Other specified noninflammatory disorders of vagina: Secondary | ICD-10-CM | POA: Insufficient documentation

## 2021-04-27 DIAGNOSIS — Z711 Person with feared health complaint in whom no diagnosis is made: Secondary | ICD-10-CM | POA: Diagnosis not present

## 2021-04-27 NOTE — Progress Notes (Signed)
HPI:      [name] is a 65 y.o. No obstetric history on file. who LMP was No LMP recorded. Patient has had a hysterectomy.  Subjective:   She presents today stating that she was seen by her PCP and found that her "bladder had fallen."  She feels like she has noticed this as well to a certain degree.  She reports no issues with urinary leakage and no pelvic pain or discomfort. She does state that she recently has noticed an increased vaginal discharge with odor. She states that she and her husband have had different sexual partners in the last year and she has a small amount of concern regarding STDs.  She would like to be tested for vaginal STDs. Of significant note: Patient has previously had a vaginal fistula where bowel protruded from the vagina.  This was possibly thought to be caused by diverticular disease.  This was accompanied by a large pelvic abscess.  Patient states that the bladder was not involved in this process.  The process was noted to have occurred behind the bladder.    Hx: The following portions of the patient's history were reviewed and updated as appropriate:             She  has a past medical history of Anxiety, Arthritis, Asthma, Chicken pox, Chronic headaches, Diverticulitis, Essential hypertension, GERD (gastroesophageal reflux disease), Guillain Barr syndrome ([place]) (1982), H/O breast implant, Heart murmur, Hypertension, Hypothyroidism, Interstitial cystitis, Interstitial cystitis, Mitral prolapse (1985), and Nutcracker esophagus. She does not have any pertinent problems on file. She  has a past surgical history that includes Cholecystectomy (2012); Appendectomy (2007); Abdominal hysterectomy (2000/2009); Tonsillectomy; Small intestine surgery (2007); Cholecystectomy (2012); Cardiac catheterization (2016); Vesico-vaginal fistula repair (2007); Neck surgery (06/11/2016); Carpal tunnel release (Right); Bowel resection (2007); Colonoscopy with propofol (N/A,  04/13/2020); Esophagogastroduodenoscopy (egd) with propofol (N/A, 04/13/2020); and Augmentation mammaplasty. Her family history includes COPD in her mother; GER disease in her father and mother; Heart attack in her father; Hypertension in her mother; Osteoarthritis in her mother; Osteoporosis in her mother; Other in her mother; Polymyalgia rheumatica in her mother. She  reports that she has never smoked. She has never used smokeless tobacco. She reports current alcohol use. She reports that she does not use drugs. She has a current medication list which includes the following prescription(s): amlodipine, amphetamine-dextroamphetamine, aripiprazole, aspirin, cyanocobalamin, diazepam, epinephrine, estradiol, levothyroxine, levothyroxine, olmesartan, pantoprazole, pantoprazole, and ventolin hfa. She is allergic to bactrim [sulfamethoxazole-trimethoprim], macrobid [nitrofurantoin macrocrystal], tizanidine, vicodin [hydrocodone-acetaminophen], trileptal [oxcarbazepine], acetaminophen, hydrocodone, nexium [esomeprazole magnesium], prilosec [omeprazole], sulfa antibiotics, tamoxifen, trimethoprim, wasp venom, erythromycin, lamotrigine, and molds & smuts.       Review of Systems:  Review of Systems  Constitutional: Denied constitutional symptoms, night sweats, recent illness, fatigue, fever, insomnia and weight loss.  Eyes: Denied eye symptoms, eye pain, photophobia, vision change and visual disturbance.  Ears/Nose/Throat/Neck: Denied ear, nose, throat or neck symptoms, hearing loss, nasal discharge, sinus congestion and sore throat.  Cardiovascular: Denied cardiovascular symptoms, arrhythmia, chest pain/pressure, edema, exercise intolerance, orthopnea and palpitations.  Respiratory: Denied pulmonary symptoms, asthma, pleuritic pain, productive sputum, cough, dyspnea and wheezing.  Gastrointestinal: Denied, gastro-esophageal reflux, melena, nausea and vomiting.  Genitourinary: See HPI for additional  information.  Musculoskeletal: Denied musculoskeletal symptoms, stiffness, swelling, muscle weakness and myalgia.  Dermatologic: Denied dermatology symptoms, rash and scar.  Neurologic: Denied neurology symptoms, dizziness, headache, neck pain and syncope.  Psychiatric: Denied psychiatric symptoms, anxiety and depression.  Endocrine: Denied endocrine symptoms  including hot flashes and night sweats.   Meds:   Current Outpatient Medications on File Prior to Visit  Medication Sig Dispense Refill   amLODipine (NORVASC) 5 MG tablet Take 5 mg by mouth daily.     amphetamine-dextroamphetamine (ADDERALL) 20 MG tablet Take 1 tablet by mouth in the morning, at noon, and at bedtime.     ARIPiprazole (ABILIFY) 10 MG tablet 15 mg.     aspirin 81 MG EC tablet Take 81 mg by mouth daily.     cyanocobalamin (,VITAMIN B-12,) 1000 MCG/ML injection Inject into the muscle.     diazepam (VALIUM) 5 MG tablet Take by mouth.     EPINEPHrine 0.3 mg/0.3 mL IJ SOAJ injection Inject 0.3 mLs (0.3 mg total) into the muscle as needed for anaphylaxis. 1 each 0   estradiol (ESTRACE) 1 MG tablet Take 1 tablet by mouth daily.     levothyroxine (SYNTHROID) 100 MCG tablet Take 1 tablet by mouth every morning (Monday, Tuesday, Wednesday) on an empty stomach with water only.  No food or other medications for 30 minutes. 36 tablet 0   levothyroxine (SYNTHROID) 112 MCG tablet Take 1 tablet by mouth every morning (Thursday, Friday, Saturday, Sunday) on an empty stomach with water only.  No food or other medications for 30 minutes. 48 tablet 0   olmesartan (BENICAR) 20 MG tablet Take 1 tablet (20 mg total) by mouth 2 (two) times daily. For blood pressure. 180 tablet 2   pantoprazole (PROTONIX) 40 MG tablet Take 1 tablet (40 mg total) by mouth 2 (two) times daily. For heartburn 180 tablet 2   pantoprazole (PROTONIX) 40 MG tablet Take 1 tablet (40 mg total) by mouth daily. 90 tablet 3   VENTOLIN HFA 108 (90 Base) MCG/ACT inhaler INHALE 2  PUFFS INTO THE LUNGS EVERY 6 HOURS AS NEEDED FOR WHEEZING OR SHORTNESS OF BREATH. 18 g 0   No current facility-administered medications on file prior to visit.      Objective:     Vitals:   04/27/21 1500  BP: 127/75   Filed Weights   04/27/21 1500  Weight: 135 lb (61.2 kg)              Physical examination   Pelvic:   Vulva: Normal appearance.  No lesions.  Vagina: No lesions or abnormalities noted.  Support: Normal pelvic support.  First to second-degree cystocele (minimal)  Urethra No masses tenderness or scarring.  Meatus Normal size without lesions or prolapse.  Cervix: Surgically absent  Anus: Normal exam.  No lesions.  Perineum: Normal exam.  No lesions.        Bimanual   Uterus: Surgically absent  Adnexae: No masses.  Non-tender to palpation.  Cul-de-sac: Negative for abnormality.             Assessment:    No obstetric history on file. Patient Active Problem List   Diagnosis Date Noted   Plantar fasciitis, left 01/31/2021   Esophageal spasm 12/09/2020   History of depression 08/16/2020   Sleep disorder, unspecified 08/16/2020   Neck pain 07/28/2020   Non-cardiac chest pain 06/13/2020   Esophageal dysphagia    Soft tissue mass 03/31/2020   Body mass index (BMI) 27.0-27.9, adult 10/22/2019   CKD (chronic kidney disease) stage 3, GFR 30-59 ml/min (HCC) 06/30/2019   Dysuria 03/16/2019   Vitamin B 12 deficiency 01/27/2019   Numbness of hand 01/13/2019   Panic disorder 12/29/2018   Mood disorder as late effect of traumatic brain injury  08/07/2018   Mild neurocognitive disorder due to traumatic brain injury 08/07/2018   Acute thoracic back pain 07/22/2018   Neuropathy 07/22/2018   Prediabetes 07/08/2018   Numbness and tingling 07/08/2018   Environmental and seasonal allergies 04/24/2018   Dyspnea 02/06/2018   Chronic fatigue 01/23/2018   Ataxia 07/16/2017   Urinary incontinence 07/16/2017   Primary osteoarthritis of left knee 05/29/2017   S/P  carpal tunnel release 05/29/2017   Primary osteoarthritis of both hands 05/29/2017   History of bilateral breast implants 05/29/2017   Interstitial cystitis 05/29/2017   History of mitral valve prolapse 05/29/2017   History of diverticulitis 05/29/2017   Carpal tunnel syndrome 05/16/2016   Stenosis of intervertebral foramina 05/16/2016   Degeneration of intervertebral disc of cervical spine without prolapsed disc 05/01/2016   Cervical disc disorder with radiculopathy of cervical region 04/12/2016   Preventative health care 11/28/2015   Esophageal reflux 08/19/2015   Essential hypertension 08/19/2015   Hypothyroidism 08/19/2015   Asthma 08/19/2015   Chronic headaches 08/19/2015   Estrogen deficiency 08/19/2015   Nutcracker esophagus 08/12/2005     1. Vaginal discharge   2. Concern about STD in female without diagnosis     Vagina looks completely healthy.  Minimal cystocele noted  Patient asymptomatic!   Plan:            1.  Continue expectant management.  Not a current candidate for surgery.  2.  STD testing performed Orders No orders of the defined types were placed in this encounter.   No orders of the defined types were placed in this encounter.     F/U  Return for Pt to contact us if symptoms worsen. I spent 31 minutes involved in the care of this patient preparing to see the patient by obtaining and reviewing her medical history (including labs, imaging tests and prior procedures), documenting clinical information in the electronic health record (EHR), counseling and coordinating care plans, writing and sending prescriptions, ordering tests or procedures and in direct communicating with the patient and medical staff discussing pertinent items from her history and physical exam.  Finis Bud, M.D. 04/27/2021 3:23 PM

## 2021-04-28 ENCOUNTER — Other Ambulatory Visit (INDEPENDENT_AMBULATORY_CARE_PROVIDER_SITE_OTHER): Payer: 59

## 2021-04-28 ENCOUNTER — Other Ambulatory Visit: Payer: Self-pay

## 2021-04-28 DIAGNOSIS — E039 Hypothyroidism, unspecified: Secondary | ICD-10-CM

## 2021-04-28 LAB — TSH: TSH: 0.3 u[IU]/mL — ABNORMAL LOW (ref 0.35–5.50)

## 2021-05-01 LAB — CERVICOVAGINAL ANCILLARY ONLY
Bacterial Vaginitis (gardnerella): POSITIVE — AB
Candida Glabrata: NEGATIVE
Candida Vaginitis: NEGATIVE
Chlamydia: NEGATIVE
Comment: NEGATIVE
Comment: NEGATIVE
Comment: NEGATIVE
Comment: NEGATIVE
Comment: NEGATIVE
Comment: NORMAL
Neisseria Gonorrhea: NEGATIVE
Trichomonas: NEGATIVE

## 2021-05-01 NOTE — Progress Notes (Signed)
Her results are c/w BV - Needs Rx for: Flagyl 500mg  - 1 PO BID X7 days

## 2021-05-02 ENCOUNTER — Other Ambulatory Visit: Payer: Self-pay

## 2021-05-02 ENCOUNTER — Telehealth: Payer: Self-pay | Admitting: Obstetrics and Gynecology

## 2021-05-02 MED ORDER — METRONIDAZOLE 500 MG PO TABS: 500.0000 mg | ORAL_TABLET | Freq: Two times a day (BID) | ORAL | 0 refills | Status: DC

## 2021-05-02 NOTE — Telephone Encounter (Signed)
Pt called back requesting Flagy- 3 days - wanted [place] to know that pharmacy closes at 6

## 2021-05-02 NOTE — Telephone Encounter (Signed)
Pt called stating that she was told by dr.evans that he was going to send in rx to [facility] to treat BV. Please Advise.

## 2021-05-06 ENCOUNTER — Other Ambulatory Visit: Payer: Self-pay | Admitting: Primary Care

## 2021-05-06 DIAGNOSIS — E039 Hypothyroidism, unspecified: Secondary | ICD-10-CM

## 2021-05-10 ENCOUNTER — Other Ambulatory Visit: Payer: Self-pay

## 2021-05-10 ENCOUNTER — Ambulatory Visit (INDEPENDENT_AMBULATORY_CARE_PROVIDER_SITE_OTHER): Payer: 59 | Admitting: Psychiatry

## 2021-05-10 DIAGNOSIS — F0630 Mood disorder due to known physiological condition, unspecified: Secondary | ICD-10-CM

## 2021-05-10 DIAGNOSIS — F410 Panic disorder [episodic paroxysmal anxiety] without agoraphobia: Secondary | ICD-10-CM

## 2021-05-10 DIAGNOSIS — S069XAS Unspecified intracranial injury with loss of consciousness status unknown, sequela: Secondary | ICD-10-CM

## 2021-05-10 NOTE — Progress Notes (Signed)
Virtual Visit via Video Note  I connected with Katie Robbins on 05/10/21 at 1:05 PM EST  by a video enabled telemedicine application and verified that I am speaking with the correct person using two identifiers.  Location: Patient: Home Provider: [place] office    I discussed the limitations of evaluation and management by telemedicine and the availability of in person appointments. The patient expressed understanding and agreed to proceed.   I provided 52 minutes of non-face-to-face time during this encounter.   [name], LCSW   THERAPIST PROGRESS NOTE  Session Time: Wednesday 05/10/2021 1:05 PM - 1:57 PM   Participation Level: Active  Behavioral Response: Alert/Anxious/Talkative/tangentiality   Type of Therapy: Individual Therapy  Treatment Goals addressed:  Patient will reduce irritability and increase normal social interaction with family and friends AEB pt reducing negative comments and harsh tone of voice during interaction with husband from daily to 3 x or less per week for 3 consecutive weeks.   Interventions: CBT and Supportive  Summary: [name] is a 65 y.o. female who is referred for services for continuity of care while her previous therapist is on medical leave.  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 She reports being impatient, becoming irritated with husband easily, having difficulty understanding people's conversations, pattern of interrupting conversations, being impulsive and saying things she thinks she shouldn't say, and a pattern of avoiding places.   Patient last was seen via virtual visit about 3 weeks ago.  She reports increased stress and anxiety due to recent marital conflict.  Per patient's report, she had an anger outburst with husband after he took a friend home and was gone longer than patient anticipated.  She says she, her husband, and the friend had talked about husband  looking at cars while out prior to leaving.  Patient expresses frustration as she reports having no recollection of that conversation although it had been only 30 minutes prior.  She reports frequent episodes of memory difficulty.  She reports it took about 3 days for she and her husband to try to get past this recent conflict Per patient's report, husband also has stated she often interjects when he is taking care of certain task around the house.  Reports additional stress related to recent medical reports from her psychiatrist.  According to patient, reports indicate patient has reached her maximum medical benefit.  Patient reports increased thoughts about her cognitive difficulties and expresses fear about how it may affect her marriage.    Suicidal/Homicidal: Nowithout intent/plan  Therapist Response: reviewed symptoms, discussed stressors, facilitated expression of thoughts and feelings, validated feelings, discussed ways patient's cognitive difficulties may be affecting her marriage, assisted patient identify ways to express her concerns and fears to husband, also discussed ways patient can work with her husband to identify helpful ways to improve their communication regarding patient interjecting/interrupting     Plan: Return again in 2 weeks.  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

## 2021-05-22 ENCOUNTER — Telehealth: Payer: Self-pay

## 2021-05-22 NOTE — Telephone Encounter (Signed)
Patient states she needs a refill on the GI cocktail 450mg  She states she gets this refilled at [facility]. Please advised if this can be refilled

## 2021-05-23 NOTE — Telephone Encounter (Signed)
[facility] and gave the verbal okay to refill the GI cocktail gave patient 2 refills on the medication. Called and informed patient of this refilled

## 2021-05-23 NOTE — Telephone Encounter (Signed)
Ok to refill  RV

## 2021-05-24 ENCOUNTER — Ambulatory Visit (INDEPENDENT_AMBULATORY_CARE_PROVIDER_SITE_OTHER): Payer: 59 | Admitting: Psychiatry

## 2021-05-24 ENCOUNTER — Other Ambulatory Visit: Payer: Self-pay

## 2021-05-24 DIAGNOSIS — F0630 Mood disorder due to known physiological condition, unspecified: Secondary | ICD-10-CM | POA: Diagnosis not present

## 2021-05-24 DIAGNOSIS — F410 Panic disorder [episodic paroxysmal anxiety] without agoraphobia: Secondary | ICD-10-CM

## 2021-05-24 DIAGNOSIS — S069XAS Unspecified intracranial injury with loss of consciousness status unknown, sequela: Secondary | ICD-10-CM | POA: Diagnosis not present

## 2021-05-24 NOTE — Progress Notes (Signed)
Virtual Visit via Video Note  I connected with Katie Robbins on 05/24/21 at 2:05 PM EST  by a video enabled telemedicine application and verified that I am speaking with the correct person using two identifiers.  Location: Patient: Home Provider: [place] office    I discussed the limitations of evaluation and management by telemedicine and the availability of in person appointments. The patient expressed understanding and agreed to proceed.   I provided 45 minutes of non-face-to-face time during this encounter.   Katie Smoker, LCSW   THERAPIST PROGRESS NOTE  Session Time: Wednesday 05/24/2021 2:05 PM - 2:53 PM   Participation Level: Active  Behavioral Response: Alert/Anxious/Talkative/tangentiality   Type of Therapy: Individual Therapy  Treatment Goals addressed:  Patient will reduce irritability and increase normal social interaction with family and friends AEB pt reducing negative comments and harsh tone of voice during interaction with husband from daily to 3 x or less per week for 3 consecutive weeks.   Interventions: CBT and Supportive  Summary: Katie Robbins is a 66 y.o. female who is referred for services for continuity of care while her previous therapist is on medical leave.  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 She reports being impatient, becoming irritated with husband easily, having difficulty understanding people's conversations, pattern of interrupting conversations, being impulsive and saying things she thinks she shouldn't say, and a pattern of avoiding places.   Patient last was seen via virtual visit about 3 weeks ago.  She reports decreased stress but continued anxiety since last session.  She reports having 2 panic attacks since last session Per patient's report, she and her husband are getting along better.  She reports having conversation with husband about her fears related to the effects  of her functioning on their marriage.  Per patient's report, he gave her reassurance of his support and commitment.  Patient also is able to verbalize in session today statements and behaviors from husband that confirms this.  However, she reports remaining fearful at times as she has questions about her condition worsening and the effects on her as well as the marriage.  Patient denies any symptoms of depression since last session as she reports being very busy with medical appointments and doing household tasks. She has gone out to dinner with her husband as well as going out to lunch with a friend.  Patient is hopeful about working with her neurologist as more testing has been scheduled.  Patient reports discussing time lapse with neurologist and Ms. hopeful about finding answers regarding this.  Patient reports she is looking forward to going to [place] later this month and spending time with her children/grandchildren for about 2 to 3 weeks.  Suicidal/Homicidal: Nowithout intent/plan  Therapist Response: reviewed symptoms, praised and reinforced patient's involvement in activity, praised and reinforced patient's efforts to express concerns to her husband, discussed effects discussed stressors, facilitated expression of thoughts and feelings, validated feelings, developed plan with patient to develop list of statements behaviors from husband that confirms his support and commitment and read list when becoming fearful   Plan: Return again in 2 weeks.  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

## 2021-05-31 ENCOUNTER — Telehealth: Payer: Self-pay | Admitting: Primary Care

## 2021-05-31 ENCOUNTER — Other Ambulatory Visit: Payer: Self-pay | Admitting: Primary Care

## 2021-05-31 DIAGNOSIS — E039 Hypothyroidism, unspecified: Secondary | ICD-10-CM

## 2021-05-31 NOTE — Telephone Encounter (Signed)
Pt called needing a new referral placed for ENDOCRINOLOGY she would like to see [place], due to [facility] cancelling her appt

## 2021-05-31 NOTE — Addendum Note (Signed)
Addended by: Pleas Koch on: 05/31/2021 01:05 PM   Modules accepted: Orders

## 2021-05-31 NOTE — Telephone Encounter (Signed)
Noted, referral placed.  

## 2021-06-07 ENCOUNTER — Other Ambulatory Visit: Payer: Self-pay

## 2021-06-07 ENCOUNTER — Ambulatory Visit (INDEPENDENT_AMBULATORY_CARE_PROVIDER_SITE_OTHER): Payer: 59 | Admitting: Psychiatry

## 2021-06-07 DIAGNOSIS — F410 Panic disorder [episodic paroxysmal anxiety] without agoraphobia: Secondary | ICD-10-CM | POA: Diagnosis not present

## 2021-06-07 DIAGNOSIS — S069XAS Unspecified intracranial injury with loss of consciousness status unknown, sequela: Secondary | ICD-10-CM | POA: Diagnosis not present

## 2021-06-07 DIAGNOSIS — F0630 Mood disorder due to known physiological condition, unspecified: Secondary | ICD-10-CM | POA: Diagnosis not present

## 2021-06-07 NOTE — Progress Notes (Signed)
Virtual Visit via Telephone Note  I connected with [name] on 06/07/21 at 2:06 PM  by telephone and verified that I am speaking with the correct person using two identifiers.  Location: Patient: Home Provider: [place] office    I discussed the limitations, risks, security and privacy concerns of performing an evaluation and management service by telephone and the availability of in person appointments. I also discussed with the patient that there may be a patient responsible charge related to this service. The patient expressed understanding and agreed to proceed.    I provided 49 minutes of non-face-to-face time during this encounter.   [name], LCSW   THERAPIST PROGRESS NOTE  Session Time: Wednesday 06/07/2021 2:06 PM - 2:55 PM   Participation Level: Active  Behavioral Response: Alert/Anxious/Talkative/tangentiality   Type of Therapy: Individual Therapy  Treatment Goals addressed:  Patient will reduce irritability and increase normal social interaction with family and friends AEB pt reducing negative comments and harsh tone of voice during interaction with husband from daily to 3 x or less per week for 3 consecutive weeks.   Interventions: CBT and Supportive  Summary: [name] is a 66 y.o. female who is referred for services for continuity of care while her previous therapist is on medical leave.  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 She reports being impatient, becoming irritated with husband easily, having difficulty understanding people's conversations, pattern of interrupting conversations, being impulsive and saying things she thinks she shouldn't say, and a pattern of avoiding places.   Patient last was seen via virtual visit about 2 weeks ago.  She reports improved mood, decreased irritability, decreased anxiety, and increased behavioral activation since last session.  Per patient's  report , the use of Seroquel has been beneficial as she states feeling more calm.  She continues to have panic attacks especially when driving.  However, she reports increased positive interaction with husband.  She states she has not been as many or as irritable.  She has been using strategies discussed in last session and this has helped become less anxious about the relationship with her husband.  She does express concerns about one of her friends and reports experiencing anxiety when thinking about interaction with..  Patient is excited about leaving for a 3-week visit to [place] with her children the end of this week.   Suicidal/Homicidal: Nowithout intent/plan  Therapist Response: reviewed symptoms, praised and reinforced patient's increased behavioral activation/use of strategies, discussed effects, discussed stressors, facilitated expression of thoughts and feelings, validated feelings, assisted patient identify ways to use assertiveness skills as well as ways to set maintain limits regarding interaction with friend    Plan: Return again in 2 weeks.  Diagnosis: Axis I: Mood disorder as late effect of TBI, Anxiety Disorder due to general medical condition        [name], LCSW 06/07/2021

## 2021-06-09 ENCOUNTER — Encounter (HOSPITAL_COMMUNITY): Payer: Self-pay

## 2021-06-21 ENCOUNTER — Ambulatory Visit (HOSPITAL_COMMUNITY): Payer: 59 | Admitting: Psychiatry

## 2021-06-22 NOTE — Telephone Encounter (Signed)
Katie Robbins,  Can you take a look at her endocrinology referral? Not sure what is going on.

## 2021-07-03 ENCOUNTER — Encounter (HOSPITAL_COMMUNITY): Payer: Self-pay | Admitting: Emergency Medicine

## 2021-07-03 ENCOUNTER — Other Ambulatory Visit: Payer: Self-pay

## 2021-07-03 ENCOUNTER — Emergency Department (HOSPITAL_COMMUNITY)
Admission: EM | Admit: 2021-07-03 | Discharge: 2021-07-03 | Disposition: A | Discharge status: Home / Self Care | Payer: 59 | Attending: Emergency Medicine | Admitting: Emergency Medicine

## 2021-07-03 ENCOUNTER — Telehealth: Payer: Self-pay | Admitting: *Deleted

## 2021-07-03 ENCOUNTER — Emergency Department (HOSPITAL_COMMUNITY): Payer: 59

## 2021-07-03 DIAGNOSIS — R519 Headache, unspecified: Secondary | ICD-10-CM | POA: Insufficient documentation

## 2021-07-03 DIAGNOSIS — R569 Unspecified convulsions: Secondary | ICD-10-CM | POA: Insufficient documentation

## 2021-07-03 DIAGNOSIS — M542 Cervicalgia: Secondary | ICD-10-CM | POA: Insufficient documentation

## 2021-07-03 DIAGNOSIS — W01198A Fall on same level from slipping, tripping and stumbling with subsequent striking against other object, initial encounter: Secondary | ICD-10-CM | POA: Insufficient documentation

## 2021-07-03 DIAGNOSIS — Z79899 Other long term (current) drug therapy: Secondary | ICD-10-CM | POA: Insufficient documentation

## 2021-07-03 DIAGNOSIS — R42 Dizziness and giddiness: Secondary | ICD-10-CM | POA: Insufficient documentation

## 2021-07-03 DIAGNOSIS — Z7982 Long term (current) use of aspirin: Secondary | ICD-10-CM | POA: Diagnosis not present

## 2021-07-03 DIAGNOSIS — Y9301 Activity, walking, marching and hiking: Secondary | ICD-10-CM | POA: Diagnosis not present

## 2021-07-03 DIAGNOSIS — R531 Weakness: Secondary | ICD-10-CM | POA: Diagnosis not present

## 2021-07-03 LAB — COMPREHENSIVE METABOLIC PANEL
ALT: 10 U/L (ref 0–44)
AST: 17 U/L (ref 15–41)
Albumin: 3.6 g/dL (ref 3.5–5.0)
Alkaline Phosphatase: 66 U/L (ref 38–126)
Anion gap: 9 (ref 5–15)
BUN: 15 mg/dL (ref 8–23)
CO2: 22 mmol/L (ref 22–32)
Calcium: 8.8 mg/dL — ABNORMAL LOW (ref 8.9–10.3)
Chloride: 104 mmol/L (ref 98–111)
Creatinine, Ser: 1.19 mg/dL — ABNORMAL HIGH (ref 0.44–1.00)
GFR, Estimated: 51 mL/min — ABNORMAL LOW (ref >60)
Glucose, Bld: 121 mg/dL — ABNORMAL HIGH (ref 70–99)
Potassium: 4.2 mmol/L (ref 3.5–5.1)
Sodium: 135 mmol/L (ref 135–145)
Total Bilirubin: 0.2 mg/dL — ABNORMAL LOW (ref 0.3–1.2)
Total Protein: 6.5 g/dL (ref 6.5–8.1)

## 2021-07-03 LAB — CBC WITH DIFFERENTIAL/PLATELET
Abs Immature Granulocytes: 0.02 10*3/uL (ref 0.00–0.07)
Basophils Absolute: 0 10*3/uL (ref 0.0–0.1)
Basophils Relative: 1 %
Eosinophils Absolute: 0.1 10*3/uL (ref 0.0–0.5)
Eosinophils Relative: 1 %
HCT: 38.4 % (ref 36.0–46.0)
Hemoglobin: 12.7 g/dL (ref 12.0–15.0)
Immature Granulocytes: 0 %
Lymphocytes Relative: 27 %
Lymphs Abs: 1.9 10*3/uL (ref 0.7–4.0)
MCH: 30.4 pg (ref 26.0–34.0)
MCHC: 33.1 g/dL (ref 30.0–36.0)
MCV: 91.9 fL (ref 80.0–100.0)
Monocytes Absolute: 0.4 10*3/uL (ref 0.1–1.0)
Monocytes Relative: 6 %
Neutro Abs: 4.5 10*3/uL (ref 1.7–7.7)
Neutrophils Relative %: 65 %
Platelets: 207 10*3/uL (ref 150–400)
RBC: 4.18 MIL/uL (ref 3.87–5.11)
RDW: 12.2 % (ref 11.5–15.5)
WBC: 6.9 10*3/uL (ref 4.0–10.5)
nRBC: 0 % (ref 0.0–0.2)

## 2021-07-03 LAB — ETHANOL: Alcohol, Ethyl (B): <10 mg/dL (ref <10)

## 2021-07-03 MED ORDER — DIPHENHYDRAMINE HCL 50 MG/ML IJ SOLN
12.5000 mg | Freq: Once | INTRAMUSCULAR | Status: AC
  Administered 2021-07-03: 12.5 mg via INTRAVENOUS
  Filled 2021-07-03: qty 1

## 2021-07-03 MED ORDER — METOCLOPRAMIDE HCL 5 MG/ML IJ SOLN
10.0000 mg | Freq: Once | INTRAMUSCULAR | Status: DC
  Filled 2021-07-03: qty 2

## 2021-07-03 MED ORDER — PROCHLORPERAZINE EDISYLATE 10 MG/2ML IJ SOLN
10.0000 mg | Freq: Once | INTRAMUSCULAR | Status: AC
  Administered 2021-07-03: 10 mg via INTRAVENOUS
  Filled 2021-07-03: qty 2

## 2021-07-03 MED ORDER — MECLIZINE HCL 25 MG PO TABS
25.0000 mg | ORAL_TABLET | Freq: Once | ORAL | Status: AC
  Administered 2021-07-03: 25 mg via ORAL
  Filled 2021-07-03: qty 1

## 2021-07-03 MED ORDER — SODIUM CHLORIDE 0.9 % IV BOLUS
1000.0000 mL | Freq: Once | INTRAVENOUS | Status: AC
Start: 2021-07-03 — End: 2021-07-03
  Administered 2021-07-03: 1000 mL via INTRAVENOUS

## 2021-07-03 MED ORDER — KETOROLAC TROMETHAMINE 30 MG/ML IJ SOLN
15.0000 mg | Freq: Once | INTRAMUSCULAR | Status: AC
  Administered 2021-07-03: 15 mg via INTRAVENOUS
  Filled 2021-07-03: qty 1

## 2021-07-03 NOTE — Telephone Encounter (Signed)
Patient is currently at [facility] ED.  

## 2021-07-03 NOTE — ED Notes (Signed)
Patient transported to CT 

## 2021-07-03 NOTE — Discharge Instructions (Addendum)
Lab work imaging and exam are reassuring.  Please continue with all home medication as prescribed.  I recommend staying hydrated, please remember to eat this will help with your overall strength.  Please follow-up with your neurologist tomorrow for further evaluation.  Come back to the emergency department if you develop chest pain, shortness of breath, severe abdominal pain, uncontrolled nausea, vomiting, diarrhea.

## 2021-07-03 NOTE — Telephone Encounter (Signed)
PLEASE NOTE: All timestamps contained within this report are represented as [place] Standard Time. CONFIDENTIALTY NOTICE: This fax transmission is intended only for the addressee. It contains information that is legally privileged, confidential or otherwise protected from use or disclosure. If you are not the intended recipient, you are strictly prohibited from reviewing, disclosing, copying using or disseminating any of this information or taking any action in reliance on or regarding this information. If you have received this fax in error, please notify us immediately by telephone so that we can arrange for its return to [place]. Phone: [phone], Toll-Free: [phone], Fax: [phone] Page: 1 of 2 Call Id: [id number] [facility] - Client TELEPHONE ADVICE RECORD [facility] Patient Name: Katie Robbins [facility] Gender: Female DOB: [birth year] Age: 66 Y 84 M Return Phone Number: [phone] (Primary), [phone] (Alternate) Address: [place]  [address] Client [facility] - Client Client Site [employer] - Day Provider [name] - NP Contact Type Call Who Is Calling Patient / Member / Family / Caregiver Call Type Triage / Clinical Relationship To Patient Self Return Phone Number [phone] (Primary) Chief Complaint HEAD INJURY - and not acting right. Change in behaviour after hitting head. Reason for Call Symptomatic / Request for Health Information Initial Comment [name] states she fell last Thursday and scraped her arm and hit her head and neck. She is having neck pain, she is dizzy and can barely walk. Translation No Nurse Assessment Nurse: D'Heur Lucia Gaskins, RN, [place] Date/Time (Eastern Time): 07/03/2021 10:45:13 AM Confirm and document reason for call. If symptomatic, describe symptoms. ---[name] states she fell last Thursday by tripping over her suitcase and scraped her  arm and hit her head and the back of her neck on her suitcase (which is hard). (she has plates and screws in her cervical spine). She is having neck pain, she is dizzy and can barely walk. She has a mark on the back of her neck. She had dizziness since Thursday night and it is getting worse. [name]'s speech is slower. Does the patient have any new or worsening symptoms? ---Yes Will a triage be completed? ---Yes Related visit to physician within the last 2 weeks? ---No Does the PT have any chronic conditions? (i.e. diabetes, asthma, this includes High risk factors for pregnancy, etc.) ---Yes List chronic conditions. ---HTN, hypothyroid, IC, neck fusion, GERD Is this a behavioral health or substance abuse call? ---No Guidelines Guideline Title Affirmed Question Affirmed Notes Nurse Date/Time Eilene Ghazi Time) Head Injury [1] ACUTE NEURO SYMPTOM AND [2] present now (DEFINITION: D'Heur Lucia Gaskins, RN, [place] 07/03/2021 10:50:32 AM PLEASE NOTE: All timestamps contained within this report are represented as [place] Standard Time. CONFIDENTIALTY NOTICE: This fax transmission is intended only for the addressee. It contains information that is legally privileged, confidential or otherwise protected from use or disclosure. If you are not the intended recipient, you are strictly prohibited from reviewing, disclosing, copying using or disseminating any of this information or taking any action in reliance on or regarding this information. If you have received this fax in error, please notify us immediately by telephone so that we can arrange for its return to [place]. Phone: [phone], Toll-Free: [phone], Fax: [phone] Page: 2 of 2 Call Id: [id number] Guidelines Guideline Title Affirmed Question Affirmed Notes Nurse Date/Time Eilene Ghazi Time) difficult to awaken OR confused thinking and talking OR slurred speech OR weakness of arms OR unsteady walking) Disp. Time Eilene Ghazi Time)  Disposition Final User 07/03/2021  10:43:34 AM Send to Urgent Jeannette Corpus, Tiffany 07/03/2021 10:57:40 AM 911 Outcome Documentation Pleak, RN, Vincente Liberty Reason: Unable to reach caller. 07/03/2021 10:51:22 AM Call EMS 911 Now Yes D'Heur Lucia Gaskins, RN, Vincente Liberty [name] Disagree/Comply Comply [name] Understands Yes PreDisposition Call Doctor Care Advice Given Per Guideline CALL EMS 911 NOW: * Triager Discretion: I'll call you back in a few minutes to be sure you were able to reach them. CARE ADVICE given per Head Injury (Adult) guideline. Referrals [facility] - ED

## 2021-07-03 NOTE — ED Notes (Signed)
Discharge instructions reviewed with patient and significant other. Patient and signifant verbalized understanding of instructions. Follow-up care and medications were reviewed. Patient wheeled out in [place]. VSS upon discharge.

## 2021-07-03 NOTE — ED Triage Notes (Signed)
Per [facility] pt reports tripping over luggage wheel Thursday night landing on suitcase. States she crawled into bed and started having dizziness. Denies any LOC. C/o progressively worse headache and dizziness since fall. Minor relief of neck pain with heating pad. Felt off balance while walking yesterday. Today could not walk at all due to dizziness.

## 2021-07-03 NOTE — ED Provider Notes (Addendum)
[facility] EMERGENCY DEPARTMENT Provider Note   CSN: [id number] Arrival date & time: 07/03/21  1149     History  Chief Complaint  Patient presents with   Dizziness   Fall    Katie Robbins is a 66 y.o. female.  HPI  Patient with medical history including history of TIAs, depression, traumatic brain injury presents to the emergency department with complaints of dizziness.  Patient states that she recent got back from visiting her daughter and when she got home she was getting ready for bed and she tripped over her suitcase collar causing her to fall backwards and hit her head on the floor.  She denies loss of conscious, states that she was able to get up off the floor and back into bed.  She states that since then she was having some slight neck pain me on the right side of her neck, is worse with movement improved with rest, she also notes she has been having a headache, states is a throbbing pain throughout her head, she has no change in vision paresthesias or weakness upper or lower extremities, she endorses that she just feels off balance, this is worsen with position, denies the room spinning, she is not on anticoag's, patient has any leaving or aggravating factors.    Husband was at bedside able to validate the story, he states that since she fell she has been off, states that she has been very off balance has been mainly bedridden, states that peers that she has some difficulty understanding speech.  I have reviewed patient's charts being followed by neurology, has a history of this traumatic brain injury which causes her to become depressed irritability and will have occasional difficulty with comprehension.  She is also followed by behavioral health which also notes this behavior since her accident.  Home Medications Prior to Admission medications   Medication Sig Start Date End Date Taking? Authorizing Provider  amLODipine (NORVASC) 5 MG tablet Take 5 mg by  mouth daily. 01/09/21  Yes [provider]  amphetamine-dextroamphetamine (ADDERALL) 20 MG tablet Take 20 mg by mouth in the morning, at noon, and at bedtime. 01/17/21  Yes [provider]  aspirin 81 MG EC tablet Take 81 mg by mouth daily.   Yes [provider]  cyanocobalamin (,VITAMIN B-12,) 1000 MCG/ML injection Inject 1,000 mcg into the muscle every 30 (thirty) days.   Yes [provider]  diazepam (VALIUM) 5 MG tablet Take 10 mg by mouth in the morning and at bedtime.   Yes [provider]  EPINEPHrine 0.3 mg/0.3 mL IJ SOAJ injection Inject 0.3 mLs (0.3 mg total) into the muscle as needed for anaphylaxis. 02/11/19  Yes Pleas Koch, NP  estradiol (ESTRACE) 1 MG tablet Take 1 tablet by mouth daily. 11/16/20 11/16/21 Yes [provider]  levothyroxine (SYNTHROID) 100 MCG tablet TAKE 1 TAB BY MOUTH IN MORNING on Monday, Tuesday, WEDNESDAY ON EMPTY STOMACH WITH WATER ONLY AT LEAST 30-60 MINUTES BEFORE BREAKFAST. Patient taking differently: Take 100 mcg by mouth See admin instructions. 181mcg daily on Monday's, Tuesday's, and Wednesday's. 05/08/21  Yes Pleas Koch, NP  levothyroxine (SYNTHROID) 112 MCG tablet TAKE 1 TABLET BY MOUTH EVERY MORNING ON THURSDAY, FRIDAY, SATURDAY AND SUNDAY TAKE ON AN EMPTY STOMACH WITH WATER ONLY. NO FOOD OR OTHER MEDS FOR 30 MINS Patient taking differently: Take 112 mcg by mouth See admin instructions. 112mg  daily on Thursday's, Friday's, Saturday's, and Sunday's. 06/01/21  Yes Pleas Koch, NP  olmesartan (BENICAR) 20 MG tablet Take 1 tablet (20 mg total) by mouth 2 (two) times daily. For blood pressure. 03/16/21  Yes Pleas Koch, NP  pantoprazole (PROTONIX) 40 MG tablet Take 1 tablet (40 mg total) by mouth daily. 03/16/21  Yes Lucilla Lame, MD  QUEtiapine (SEROQUEL) 25 MG tablet Take 6.25 mg by mouth at bedtime. 05/17/21  Yes [provider]  VENTOLIN HFA 108 (90 Base) MCG/ACT inhaler INHALE 2  PUFFS INTO THE LUNGS EVERY 6 HOURS AS NEEDED FOR WHEEZING OR SHORTNESS OF BREATH. Patient taking differently: Inhale 2 puffs into the lungs every 6 (six) hours as needed for wheezing or shortness of breath. 08/11/20  Yes Pleas Koch, NP  metroNIDAZOLE (FLAGYL) 500 MG tablet Take 1 tablet (500 mg total) by mouth 2 (two) times daily. Patient not taking: Reported on 07/03/2021 05/02/21   Harlin Heys, MD      Allergies    Bactrim [sulfamethoxazole-trimethoprim], Macrobid [nitrofurantoin macrocrystal], Tizanidine, Vicodin [hydrocodone-acetaminophen], Trileptal [oxcarbazepine], Acetaminophen, Hydrocodone, Nexium [esomeprazole magnesium], Prilosec [omeprazole], Sulfa antibiotics, Tamoxifen, Trimethoprim, Wasp venom, Erythromycin, Lamotrigine, and Molds & smuts    Review of Systems   Review of Systems  Constitutional:  Negative for chills and fever.  Respiratory:  Negative for shortness of breath.   Cardiovascular:  Negative for chest pain.  Gastrointestinal:  Negative for abdominal pain.  Neurological:  Positive for dizziness and headaches. Negative for speech difficulty and weakness.   Physical Exam Updated Vital Signs BP 133/71    Pulse 60    Temp 97.9 F (36.6 C) (Oral)    Resp 11    Ht 5\' 2"  (1.575 m)    Wt 61.2 kg    SpO2 97%    BMI 24.69 kg/m  Physical Exam Vitals and nursing note reviewed.  Constitutional:      General: She is not in acute distress.    Appearance: She is not ill-appearing.  HENT:     Head: Normocephalic and atraumatic.     Nose: No congestion.     Mouth/Throat:     Mouth: Mucous membranes are moist.     Pharynx: Oropharynx is clear. No oropharyngeal exudate or posterior oropharyngeal erythema.  Eyes:     General: No visual field deficit.    Extraocular Movements: Extraocular movements intact.     Conjunctiva/sclera: Conjunctivae normal.     Pupils: Pupils are equal, round, and reactive to light.  Neck:     Comments: Patient is in a c-collar  difficult to assess cervical spine. Cardiovascular:     Rate and Rhythm: Normal rate and regular rhythm.     Pulses: Normal pulses.     Heart sounds: No murmur heard.   No friction rub. No gallop.  Pulmonary:     Effort: No respiratory distress.     Breath sounds: No wheezing, rhonchi or rales.  Abdominal:     Palpations: Abdomen is soft.     Tenderness: There is no abdominal tenderness. There is no right CVA tenderness or left CVA tenderness.  Musculoskeletal:     Comments: Patient is moving all 4 extremities at difficulty, spine was palpated was nontender to palpation no step-off deformities noted.  Skin:    General: Skin is warm and dry.  Neurological:     Mental Status: She is alert.     Cranial Nerves: Cranial nerves 2-12 are intact. No dysarthria or facial asymmetry.     Sensory: Sensation is intact.     Motor: No weakness.  Coordination: Romberg sign negative. Finger-Nose-Finger Test normal.     Comments: Patient alert and orient x4, well occasional pauses in her speech, cranial nerves II through XII grossly intact, able to follow two-step commands, no unilateral weakness present.  She will have a jerking-like movement when she moves her upper or lower extremities.  Resolves at rest.  Patient has some difficulty with ambulating but appears to be more due to weakness in her legs bilaterally, she has no trunkcal ataxia   Psychiatric:        Mood and Affect: Mood normal.    ED Results / Procedures / Treatments   Labs (all labs ordered are listed, but only abnormal results are displayed) Labs Reviewed  COMPREHENSIVE METABOLIC PANEL - Abnormal; Notable for the following components:      Result Value   Glucose, Bld 121 (*)    Creatinine, Ser 1.19 (*)    Calcium 8.8 (*)    Total Bilirubin 0.2 (*)    GFR, Estimated 51 (*)    All other components within normal limits  CBC WITH DIFFERENTIAL/PLATELET  ETHANOL  RAPID URINE DRUG SCREEN, HOSP PERFORMED  URINALYSIS, ROUTINE W  REFLEX MICROSCOPIC    EKG EKG Interpretation  Date/Time:  Monday July 03 2021 11:57:06 EST Ventricular Rate:  78 PR Interval:  177 QRS Duration: 96 QT Interval:  418 QTC Calculation: 477 R Axis:   -42 Text Interpretation: Sinus rhythm Probable left atrial enlargement Left axis deviation Confirmed by Regan Lemming ([id number]) on 07/03/2021 11:59:06 AM  Radiology CT Head Wo Contrast  Result Date: 07/03/2021 CLINICAL DATA:  Recently fell, headache and dizziness EXAM: CT HEAD WITHOUT CONTRAST TECHNIQUE: Contiguous axial images were obtained from the base of the skull through the vertex without intravenous contrast. RADIATION DOSE REDUCTION: This exam was performed according to the departmental dose-optimization program which includes automated exposure control, adjustment of the mA and/or kV according to patient size and/or use of iterative reconstruction technique. COMPARISON:  None. FINDINGS: Brain: There is no acute intracranial hemorrhage, mass effect, or edema. Gray-white differentiation is preserved. There is no extra-axial fluid collection. Ventricles and sulci are within normal limits in size and configuration. Patchy hypoattenuation in the supratentorial white matter is nonspecific but may reflect mild chronic microvascular ischemic changes. Small chronic right cerebellar infarct. Vascular: No hyperdense vessel or unexpected calcification. Skull: Calvarium is unremarkable. Sinuses/Orbits: No acute finding. Other: None. IMPRESSION: No acute intracranial abnormality. Electronically Signed   By: Macy Mis M.D.   On: 07/03/2021 13:40   CT Cervical Spine Wo Contrast  Result Date: 07/03/2021 CLINICAL DATA:  Neck trauma (Age >= 65y) EXAM: CT CERVICAL SPINE WITHOUT CONTRAST TECHNIQUE: Multidetector CT imaging of the cervical spine was performed without intravenous contrast. Multiplanar CT image reconstructions were also generated. RADIATION DOSE REDUCTION: This exam was performed according to  the departmental dose-optimization program which includes automated exposure control, adjustment of the mA and/or kV according to patient size and/or use of iterative reconstruction technique. COMPARISON:  12/17/2020 FINDINGS: Alignment: Stable. Skull base and vertebrae: Stable vertebral body heights. Postoperative changes of anterior fusion are again identified at C3-C4 and C5-C7. No evidence of hardware complication. Soft tissues and spinal canal: No prevertebral fluid or swelling. No visible canal hematoma. Disc levels:  Degenerative changes are similar appearance. Upper chest: No apical lung mass. Other: None. IMPRESSION: No acute cervical spine fracture. Electronically Signed   By: Macy Mis M.D.   On: 07/03/2021 13:46    Procedures Procedures    Medications Ordered  in ED Medications  metoCLOPramide (REGLAN) injection 10 mg (10 mg Intravenous Patient Refused/Not Given 07/03/21 1304)  meclizine (ANTIVERT) tablet 25 mg (25 mg Oral Given 07/03/21 1303)  sodium chloride 0.9 % bolus 1,000 mL (1,000 mLs Intravenous New Bag/Given 07/03/21 1435)  prochlorperazine (COMPAZINE) injection 10 mg (10 mg Intravenous Given 07/03/21 1437)  diphenhydrAMINE (BENADRYL) injection 12.5 mg (12.5 mg Intravenous Given 07/03/21 1435)  ketorolac (TORADOL) 30 MG/ML injection 15 mg (15 mg Intravenous Given 07/03/21 1436)    ED Course/ Medical Decision Making/ A&P                           Medical Decision Making Amount and/or Complexity of Data Reviewed Labs: ordered. Radiology: ordered.  Risk Prescription drug management.   This patient presents to the ED for concern of fall and dizziness, this involves an extensive number of treatment options, and is a complaint that carries with it a high risk of complications and morbidity.  The differential diagnosis includes CVA, intracranial head bleed, metabolic abnormality    Additional history obtained:  Additional history obtained from husband who is at bedside,  electronic medical record External records from outside source obtained and reviewed including please see HPI for further detail   Co morbidities that complicate the patient evaluation  Traumatic brain injury  Social Determinants of Health:  N/A    Lab Tests:  I Ordered, and personally interpreted labs.  The pertinent results include: CBC unremarkable, CMP shows glucose of 121, creatinine 1.19 calcium 8.8 GFR 51 ethanol less than 10   Imaging Studies ordered:  I ordered imaging studies including CT head, CT C-spine I independently visualized and interpreted imaging which showed both negative for acute findings I agree with the radiologist interpretation    Reevaluation:  On arrival patient presented with headaches and dizziness, she is provided with fluids, as well as meclizine states that she no longer feels still dizzy but endorses that she has a headache and was having difficulty walking.  Personally ambulated the patient, she is not ataxic on my exam, she has some upper extremity tremors which come and go.  She is able to maintain her posture and walk but is slightly weak in her legs.  will provide her with a migraine cocktail and reassess.  Husband was also at bedside and explained that patient will go through these cycles where she is uncoordinated for couple days and this will resolve on its own but gone for some time.  Reassessed after migraine cocktail, she is found resting comfortably, vital signs have remained stable, she has no complaints, she is agreeable for discharge.    Test Considered:  MRI but will defer as very low suspicion for cerebellar stroke as she is not ataxic on my exam, presentation is atypical etiology.    Rule out low suspicion for internal head bleed and or mass as CT imaging is negative for acute findings.  Low suspicion for CVA she has no focal deficit present my exam.  Low suspicion for dissection of the vertebral or carotid artery as  presentation atypical of etiology.  Low suspicion for meningitis as she has no meningeal sign present.  Low suspicion for electrolyte derailments as chemistries within normal limits.  Low suspicion for systemic infection as patient not meet SIRS or sepsis criteria.     Dispostion and problem list  After consideration of the diagnostic results and the patients response to treatment, I feel that the patent would  benefit from discharge.   Balance difficulties-unclear etiology but likely multifactorial, possible related from a complex migraine, minor concussion, mood disorder from traumatic brain injury.  will have her follow-up with her neurologist tomorrow for further evaluation.  Gave strict return precautions.            Final Clinical Impression(s) / ED Diagnoses Final diagnoses:  Weakness    Rx / DC Orders ED Discharge Orders     None         Marcello Fennel, PA-C 07/03/21 1644    Marcello Fennel, PA-C 07/03/21 1646    Regan Lemming, MD 07/03/21 1904

## 2021-07-03 NOTE — Telephone Encounter (Signed)
Noted, will await ED notes. 

## 2021-07-05 ENCOUNTER — Ambulatory Visit (INDEPENDENT_AMBULATORY_CARE_PROVIDER_SITE_OTHER): Payer: 59 | Admitting: Psychiatry

## 2021-07-05 ENCOUNTER — Other Ambulatory Visit: Payer: Self-pay

## 2021-07-05 ENCOUNTER — Other Ambulatory Visit: Payer: Self-pay | Admitting: Primary Care

## 2021-07-05 DIAGNOSIS — F0630 Mood disorder due to known physiological condition, unspecified: Secondary | ICD-10-CM | POA: Diagnosis not present

## 2021-07-05 DIAGNOSIS — S069XAS Unspecified intracranial injury with loss of consciousness status unknown, sequela: Secondary | ICD-10-CM | POA: Diagnosis not present

## 2021-07-05 DIAGNOSIS — E039 Hypothyroidism, unspecified: Secondary | ICD-10-CM

## 2021-07-05 DIAGNOSIS — F410 Panic disorder [episodic paroxysmal anxiety] without agoraphobia: Secondary | ICD-10-CM

## 2021-07-05 NOTE — Progress Notes (Signed)
Virtual Visit via Video Note  I connected with Katie Robbins on 07/05/21 at 2:10 PM  by a video enabled telemedicine application and verified that I am speaking with the correct person using two identifiers.  Location: Patient: Home Provider: [place] office    I discussed the limitations of evaluation and management by telemedicine and the availability of in person appointments. The patient expressed understanding and agreed to proceed.   I provided 45  minutes of non-face-to-face time during this encounter.   [name], LCSW   THERAPIST PROGRESS NOTE  Session Time: Wednesday 07/05/2021 2:10 PM -  2:55 PM   Participation Level: Active  Behavioral Response: Alert/Anxious/Talkative/tangentiality   Type of Therapy: Individual Therapy  Treatment Goals addressed:  Patient will reduce irritability and increase normal social interaction with family and friends AEB pt reducing negative comments and harsh tone of voice during interaction with husband from daily to 3 x or less per week for 3 consecutive weeks.   Interventions: CBT and Supportive  Summary: Katie Robbins is a 65 y.o. female who is referred for services for continuity of care while her previous therapist is on medical leave.  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 She reports being impatient, becoming irritated with husband easily, having difficulty understanding people's conversations, pattern of interrupting conversations, being impulsive and saying things she thinks she shouldn't say, and a pattern of avoiding places.   Patient last was seen via virtual visit about 3-4 weeks ago.  She reports doing well during her 3-week visit to her children in [place].  She reports becoming panicky once and cites trigger as being missing her husband.  But on the last night of her visit, patient fell and suffered a concussion.  She was diagnosed once she returned to  [place] last week.  She has been told to rest for the next couple of weeks and avoid watching television being on a tablet playing games on her phone, using her computer.  Patient reports some anxiety as well as feeling a little down about this as she is unable to do her normal activities.  However.  She is hopeful about a positive recovery and is working with her neurologist.  She reports strong support from her husband and continued positive interaction with her husband.  Suicidal/Homicidal: Nowithout intent/plan  Therapist Response: reviewed symptoms, discussed stressors, facilitated expression of thoughts and feelings, validated feelings, assisted patient identify activities she can pursue as well as ways to pace self, assisted patient identify coping statements to counteract negative thoughts about her changed activity level.  Plan: Return again in 2 weeks.  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

## 2021-07-12 DIAGNOSIS — S060XAA Concussion with loss of consciousness status unknown, initial encounter: Secondary | ICD-10-CM

## 2021-07-12 HISTORY — DX: Concussion with loss of consciousness status unknown, initial encounter: S06.0XAA

## 2021-07-14 ENCOUNTER — Ambulatory Visit: Payer: 59 | Admitting: Primary Care

## 2021-07-14 ENCOUNTER — Other Ambulatory Visit: Payer: Self-pay | Admitting: Primary Care

## 2021-07-14 DIAGNOSIS — J4540 Moderate persistent asthma, uncomplicated: Secondary | ICD-10-CM

## 2021-07-19 ENCOUNTER — Ambulatory Visit (INDEPENDENT_AMBULATORY_CARE_PROVIDER_SITE_OTHER): Payer: 59 | Admitting: Psychiatry

## 2021-07-19 ENCOUNTER — Other Ambulatory Visit: Payer: Self-pay

## 2021-07-19 ENCOUNTER — Encounter (HOSPITAL_COMMUNITY): Payer: Self-pay

## 2021-07-19 DIAGNOSIS — F410 Panic disorder [episodic paroxysmal anxiety] without agoraphobia: Secondary | ICD-10-CM | POA: Diagnosis not present

## 2021-07-19 DIAGNOSIS — S069XAS Unspecified intracranial injury with loss of consciousness status unknown, sequela: Secondary | ICD-10-CM

## 2021-07-19 DIAGNOSIS — F0630 Mood disorder due to known physiological condition, unspecified: Secondary | ICD-10-CM | POA: Diagnosis not present

## 2021-07-19 NOTE — Plan of Care (Signed)
Pt participated in development of plan. ?

## 2021-07-19 NOTE — Progress Notes (Signed)
Virtual Visit via Video Note ? ?I connected with Katie Robbins on 07/19/21 at 2:05 PM EST  by a video enabled telemedicine application and verified that I am speaking with the correct person using two identifiers. ? ?Location: ?Patient: Home ?Provider: [facility] [place] office  ?  ?I discussed the limitations of evaluation and management by telemedicine and the availability of in person appointments. The patient expressed understanding and agreed to proceed. ? ?I provided 50 minutes of non-face-to-face time during this encounter. ? ? ?Birda Didonato E Zoei Amison, LCSW ? ? ?THERAPIST PROGRESS NOTE ? ?Session Time: Wednesday 07/19/2021 2:05 PM - 2:55 PM ? ?Participation Level: Active ? ?Behavioral Response: Alert/Anxious/Talkative/tangentiality  ? ?Type of Therapy: Individual Therapy ? ?Treatment Goals addressed: Alleviate symptoms of depression AEB resuming normal interest in socialization by having phone contact and in person socialization with friends or family 3 times per week for 60 days per patient's report  ? ?Progress on goals: Initial ? ?Interventions: CBT and Supportive ? ?Summary: Katie Robbins is a 66 y.o. female who is referred for services for continuity of care while her previous therapist is on medical leave.  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 She reports being impatient, becoming irritated with husband easily, having difficulty understanding people's conversations, pattern of interrupting conversations, being impulsive and saying things she thinks she shouldn't say, and a pattern of avoiding places.  ? ?Patient last was seen via virtual visit about 2-3 weeks ago.  She states having ups and downs since last session.  There are days when she does things and then there are other days when she does not do anything patient's report.  She is making efforts to cope with the recovery process from the concussion.  On the days she is productive, she reports  doing craft projects, small household task, and sometimes doing an errand with her husband.  However, she reports being very tired after a day of involvement in activities and having to rest due to being unable to do anything on the following day.  On these days, patient reports negative ruminating thoughts and depressed mood.  She expresses frustration about the recovery process from the concussion.  She reports finding self becoming irritable and sometimes reverting to all patterns of using a harsh tone with her husband.  But for the most part, she reports she has accomplished the goal of improving interaction with husband.  However, she has been more withdrawn socially from other people.  Per her report, she avoids answering the phone even calls from her children.  She avoids having people over at her home or going out to dinner with other couples as she has in the past.  Patient continues to work with medical providers and her psychiatrist. ? ?Suicidal/Homicidal: Nowithout intent/plan ? ?Therapist Response: reviewed symptoms, discussed stressors, facilitated expression of thoughts and feelings, validated feelings, reiterated ways to pace self, reviewed coping statements, also reviewed relaxation techniques to cope with irritability, reviewed/revised treatment plan, obtained patient's permission to electronically sign plan for patient as this was a virtual visit assisted patient identify activities she can pursue as well as ways to pace self, assisted patient identify coping statements to counteract negative thoughts about her changed activity level. ? Plan: Return again in 2 weeks. ? ?Diagnosis: Axis I: Mood disorder as late effect of TBI, Anxiety Disorder due to general medical condition ? ? Collaboration of Care: Other none needed at this session ? ?Patient/Guardian was advised  Release of Information must be obtained prior to any record release in order to collaborate their care with an outside provider.  Patient/Guardian was advised if they have not already done so to contact the registration department to sign all necessary forms in order for [place] to release information regarding their care.  ? ?Consent: Patient/Guardian gives verbal consent for treatment and assignment of benefits for services provided during this visit. Patient/Guardian expressed understanding and agreed to proceed.   ? ? ? ?Avis Tirone E Viktoriya Glaspy, LCSW ?07/19/2021 ? ? ? ? ? ?

## 2021-07-21 ENCOUNTER — Encounter: Payer: Self-pay | Admitting: Primary Care

## 2021-07-21 ENCOUNTER — Other Ambulatory Visit: Payer: Self-pay

## 2021-07-21 ENCOUNTER — Ambulatory Visit: Payer: 59 | Admitting: Primary Care

## 2021-07-21 VITALS — BP 138/80 | HR 89 | Temp 98.4°F | Resp 12 | Ht 62.0 in | Wt 135.0 lb

## 2021-07-21 DIAGNOSIS — E039 Hypothyroidism, unspecified: Secondary | ICD-10-CM

## 2021-07-21 DIAGNOSIS — R7303 Prediabetes: Secondary | ICD-10-CM | POA: Diagnosis not present

## 2021-07-21 DIAGNOSIS — S069XAS Unspecified intracranial injury with loss of consciousness status unknown, sequela: Secondary | ICD-10-CM

## 2021-07-21 DIAGNOSIS — F0630 Mood disorder due to known physiological condition, unspecified: Secondary | ICD-10-CM | POA: Diagnosis not present

## 2021-07-21 DIAGNOSIS — G8929 Other chronic pain: Secondary | ICD-10-CM | POA: Diagnosis not present

## 2021-07-21 DIAGNOSIS — M542 Cervicalgia: Secondary | ICD-10-CM | POA: Diagnosis not present

## 2021-07-21 DIAGNOSIS — R519 Headache, unspecified: Secondary | ICD-10-CM

## 2021-07-21 LAB — SEDIMENTATION RATE: Sed Rate: 7 mm/hr (ref 0–30)

## 2021-07-21 LAB — HEMOGLOBIN A1C: Hgb A1c MFr Bld: 5.9 % (ref 4.6–6.5)

## 2021-07-21 LAB — TSH: TSH: 0.11 u[IU]/mL — ABNORMAL LOW (ref 0.35–5.50)

## 2021-07-21 NOTE — Assessment & Plan Note (Signed)
Following with psychiatry and therapy. ? ?Continue Seroquel 12.5 mg daily, Adderall 20 mg TID as prescribed.  ?

## 2021-07-21 NOTE — Assessment & Plan Note (Signed)
Ongoing, following with neurology.  ? ?CT head and CT-spine reviewed from ED visit in February 2023. ?

## 2021-07-21 NOTE — Patient Instructions (Signed)
Stop by the lab prior to leaving today. I will notify you of your results once received.  ? ?I placed a new referral to endocrinology. Please notify me if you continue to have problems getting an appointment.  ? ?It was a pleasure to see you today! ? ?

## 2021-07-21 NOTE — Progress Notes (Signed)
Subjective:    Patient ID: Katie Robbins, female    DOB: 1955/10/18, 66 y.o.   MRN: [id number]  HPI  Katie Robbins is a very pleasant 66 y.o. female with a significant medical history including prediabetes, asthma, hypertension, neuropathy, osteoarthritis, CKD, hypothyroidism, TBI, mood disorder who presents today for ED follow-up and hypothyroidism.   She presented to [facility] ED on 07/03/2021 for symptoms of dizziness, neck pain, headaches.  Her dizziness began after tripping over a suitcase causing her to fall backwards and hitting her head on the floor.  She did not lose consciousness.  Her husband endorsed that she was more off balanced and has been mostly sedentary in the bed.  During her stay in the ED she was provided with a migraine cocktail.  She was ambulatory.  She underwent CT head and CT cervical spine which was negative for acute findings.  Labs were grossly negative.  She was discharged home later that evening with recommendations to follow-up with neurology as scheduled.  Since her ED visit she's feeling about the same. She's noticed a dent into the "muscle" of her left lower neck. She endorses 22 days of imbalance issues. She continues to notice headaches and neck pain. She was evaluated by her neurologist on 07/04/21, was told that she needed brain rest. She is now sleeping 12-13 hours daily for months. She underwent EEG in late January 2023 which was negative.   She resumed Seroquel in early January 2023 with 6.25 mg, is now up to 12.5 mg since 07/04/21, after her fall. She is taking aspirin for her headaches.   She is compliant to her levothyroxine. She is taking 100 mcg three days weekly and 112 mcg four days weekly. She is taking levothyroxine every morning with water only. No food or other medication for 30 min.     Review of Systems  Eyes:  Negative for visual disturbance.  Respiratory:  Negative for shortness of breath.   Cardiovascular:  Negative for chest pain.   Musculoskeletal:  Positive for neck pain.  Neurological:  Positive for light-headedness and headaches.        Past Medical History:  Diagnosis Date   Anxiety    Arthritis    Asthma    Chicken pox    Chronic headaches    Diverticulitis    Essential hypertension    GERD (gastroesophageal reflux disease)    Guillain Barr syndrome ([place]) 1982   H/O breast implant    Heart murmur    Hypertension    Hypothyroidism    Interstitial cystitis    Interstitial cystitis    Mitral prolapse 1985   Nutcracker esophagus     Social History   Socioeconomic History   Marital status: Married    Spouse name: [name]   Number of children: 2   Years of education: Some college   Highest education level: Not on file  Occupational History   Occupation: Retired  Tobacco Use   Smoking status: Never   Smokeless tobacco: Never  Vaping Use   Vaping Use: Never used  Substance and Sexual Activity   Alcohol use: Yes    Comment: socially   Drug use: No   Sexual activity: Yes    Birth control/protection: Surgical  Other Topics Concern   Not on file  Social History Narrative   Lives with husband   Caffeine use: rare   Right handed   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     Social Determinants of Health   Financial Resource Strain:  Not on file  Food Insecurity: Not on file  Transportation Needs: Not on file  Physical Activity: Not on file  Stress: Not on file  Social Connections: Not on file  Intimate Partner Violence: Not on file    Past Surgical History:  Procedure Laterality Date   ABDOMINAL HYSTERECTOMY  2000/2009   APPENDECTOMY  2007   [place]  2007   CARDIAC CATHETERIZATION  2016   [place]   CARPAL TUNNEL RELEASE Right    CHOLECYSTECTOMY  2012   CHOLECYSTECTOMY  2012   COLONOSCOPY WITH PROPOFOL N/A 04/13/2020   Procedure: COLONOSCOPY WITH PROPOFOL;  Surgeon: Lin Landsman, MD;  Location: [place];  Service: Gastroenterology;  Laterality: N/A;    ESOPHAGOGASTRODUODENOSCOPY (EGD) WITH PROPOFOL N/A 04/13/2020   Procedure: ESOPHAGOGASTRODUODENOSCOPY (EGD) WITH PROPOFOL;  Surgeon: Lin Landsman, MD;  Location: [facility] END[facility]COPY;  Service: Gastroenterology;  Laterality: N/A;   NECK SURGERY  06/11/2016   plates and screws in neck    SMALL INTESTINE SURGERY  2007   Bowel resection   TONSILLECTOMY     VESICO-VAGINAL FISTULA REPAIR  2007    Family History  Problem Relation Age of Onset   Hypertension Mother    GER disease Mother    Polymyalgia rheumatica Mother    COPD Mother    Osteoarthritis Mother    Osteoporosis Mother    Other Mother        Giant Cell Arteritis    GER disease Father    Heart attack Father     Allergies  Allergen Reactions   Bactrim [Sulfamethoxazole-Trimethoprim] Hives   Macrobid [Nitrofurantoin Macrocrystal] Hives   Tizanidine Other (See Comments)    Bad Mood changs   Vicodin [Hydrocodone-Acetaminophen] Swelling    Puffy eyes and face   Trileptal [Oxcarbazepine] Rash   Acetaminophen     Other reaction(s): Puffy face/eyes   Hydrocodone    Nexium [Esomeprazole Magnesium]     Face swelling, hives    Prilosec [Omeprazole]     Face swelling, hives    Sulfa Antibiotics    Tamoxifen    Trimethoprim     Other reaction(s): Hives   Wasp Venom Hives    swelling   Erythromycin Rash   Lamotrigine Rash   Molds & Smuts Anxiety    Current Outpatient Medications on File Prior to Visit  Medication Sig Dispense Refill   albuterol (VENTOLIN HFA) 108 (90 Base) MCG/ACT inhaler INHALE 2 PUFFS INTO THE LUNGS EVERY 6 HOURS AS NEEDED FOR WHEEZING OR SHORTNESS OF BREATH. 8.5 g 0   amLODipine (NORVASC) 5 MG tablet Take 5 mg by mouth daily.     amphetamine-dextroamphetamine (ADDERALL) 20 MG tablet Take 20 mg by mouth in the morning, at noon, and at bedtime.     aspirin 81 MG EC tablet Take 81 mg by mouth daily.     cyanocobalamin (,VITAMIN B-12,) 1000 MCG/ML injection Inject 1,000 mcg into the muscle every 30  (thirty) days.     diazepam (VALIUM) 5 MG tablet Take 10 mg by mouth in the morning and at bedtime.     EPINEPHrine 0.3 mg/0.3 mL IJ SOAJ injection Inject 0.3 mLs (0.3 mg total) into the muscle as needed for anaphylaxis. 1 each 0   estradiol (ESTRACE) 1 MG tablet Take 1 tablet by mouth daily.     levothyroxine (SYNTHROID) 100 MCG tablet TAKE 1 TAB BY MOUTH IN MORNING on Monday, Tuesday, WEDNESDAY ON EMPTY STOMACH WITH WATER ONLY AT LEAST  30-60 MINUTES BEFORE BREAKFAST. (Patient taking differently: Take 100 mcg by mouth See admin instructions. 175mg daily on Monday's, Tuesday's, and Wednesday's.) 36 tablet 0   levothyroxine (SYNTHROID) 112 MCG tablet TAKE 1 TABLET BY MOUTH EVERY MORNING ON THURSDAY, FRIDAY, SATURDAY AND SUNDAY TAKE ON AN EMPTY STOMACH WITH WATER ONLY. NO FOOD OR OTHER MEDS FOR 30 MINS (Patient taking differently: Take 112 mcg by mouth See admin instructions. '112mg'$  daily on Thursday's, Friday's, Saturday's, and Sunday's.) 48 tablet 0   olmesartan (BENICAR) 20 MG tablet Take 1 tablet (20 mg total) by mouth 2 (two) times daily. For blood pressure. 180 tablet 2   pantoprazole (PROTONIX) 40 MG tablet Take 1 tablet (40 mg total) by mouth daily. 90 tablet 3   QUEtiapine (SEROQUEL) 25 MG tablet Take 6.25 mg by mouth at bedtime.     metroNIDAZOLE (FLAGYL) 500 MG tablet Take 1 tablet (500 mg total) by mouth 2 (two) times daily. (Patient not taking: Reported on 07/03/2021) 14 tablet 0   No current facility-administered medications on file prior to visit.    BP 138/80 (BP Location: Left Arm, Patient Position: Sitting, Cuff Size: Normal)    Pulse 89    Temp 98.4 F (36.9 C) (Oral)    Resp 12    Ht '5\' 2"'$  (1.575 m)    Wt 135 lb (61.2 kg)    SpO2 98%    BMI 24.69 kg/m  Objective:   Physical Exam Eyes:     Extraocular Movements: Extraocular movements intact.  Cardiovascular:     Rate and Rhythm: Normal rate and regular rhythm.  Pulmonary:     Effort: Pulmonary effort is normal.     Breath  sounds: Normal breath sounds.  Musculoskeletal:     Cervical back: Neck supple.  Skin:    General: Skin is warm and dry.  Neurological:     Mental Status: She is alert and oriented to person, place, and time.     Cranial Nerves: No cranial nerve deficit.  Psychiatric:        Mood and Affect: Mood normal.          Assessment & Plan:      This visit occurred during the SARS-CoV-2 public health emergency.  Safety protocols were in place, including screening questions prior to the visit, additional usage of staff PPE, and extensive cleaning of exam room while observing appropriate contact time as indicated for disinfecting solutions.

## 2021-07-21 NOTE — Assessment & Plan Note (Signed)
Acute on chronic, worse since her fall in February 2023. ? ?ED notes, labs, imaging reviewed.  ? ?Neurology notes reviewed from [place] from February 2023 visit. ? ? ?

## 2021-07-21 NOTE — Assessment & Plan Note (Signed)
Repeat A1C pending today ?

## 2021-07-21 NOTE — Assessment & Plan Note (Signed)
She is taking both levothyroxine doses as prescribed and correctly. ? ?Continue levothyroxine 100 mcg three days weekly and 112 mcg four days weekly. ? ?Repeat TSH pending. ? ?She has yet to hear from endocrinology regarding an appointment despite 2 prior referrals. Will refer again.  ?

## 2021-07-22 DIAGNOSIS — E039 Hypothyroidism, unspecified: Secondary | ICD-10-CM

## 2021-07-24 ENCOUNTER — Telehealth: Payer: Self-pay | Admitting: Primary Care

## 2021-07-24 NOTE — Telephone Encounter (Signed)
Patient calling to update fax number for [facility]--[phone] Fax ? ? ?Also, did her TSH lab on Friday, would like a mychart message about any adjustments in medication ?

## 2021-07-25 MED ORDER — LEVOTHYROXINE SODIUM 112 MCG PO TABS: ORAL_TABLET | ORAL | 0 refills | Status: DC

## 2021-07-25 MED ORDER — LEVOTHYROXINE SODIUM 100 MCG PO TABS: ORAL_TABLET | ORAL | 0 refills | Status: DC

## 2021-08-02 ENCOUNTER — Other Ambulatory Visit: Payer: Self-pay

## 2021-08-02 ENCOUNTER — Ambulatory Visit (INDEPENDENT_AMBULATORY_CARE_PROVIDER_SITE_OTHER): Payer: 59 | Admitting: Psychiatry

## 2021-08-02 DIAGNOSIS — F0630 Mood disorder due to known physiological condition, unspecified: Secondary | ICD-10-CM

## 2021-08-02 DIAGNOSIS — F410 Panic disorder [episodic paroxysmal anxiety] without agoraphobia: Secondary | ICD-10-CM

## 2021-08-02 DIAGNOSIS — S069XAS Unspecified intracranial injury with loss of consciousness status unknown, sequela: Secondary | ICD-10-CM | POA: Diagnosis not present

## 2021-08-02 NOTE — Progress Notes (Signed)
Virtual Visit via Video Note ? ?I connected with [name] on 08/02/21 at 2:10 PM EDT  by a video enabled telemedicine application and verified that I am speaking with the correct person using two identifiers. ? ?Location: ?Patient: Home ?Provider: [facility] office  ?  ?I discussed the limitations of evaluation and management by telemedicine and the availability of in person appointments. The patient expressed understanding and agreed to proceed. ? ?I provided 50 minutes of non-face-to-face time during this encounter. ? ? ?Siris Hoos E Artis Beggs, LCSW ? ?t ?THERAPIST PROGRESS NOTE ? ?Session Time: Wednesday 08/02/2021 2:10 PM  - 3:00 PM  ? ?Participation Level: Active ? ?Behavioral Response: Alert/Anxious/Talkative/tangentiality  ? ?Type of Therapy: Individual Therapy ? ?Treatment Goals addressed: Alleviate symptoms of depression AEB resuming normal interest in socialization by having phone contact and in person socialization with friends or family 3 times per week for 60 days per patient's report  ? ?Progress on goals: Progressing ? ? ?Interventions: CBT and Supportive ? ?Summary: Katie Robbins is a 66 y.o. female who is referred for services for continuity of care while her previous therapist is on medical leave.  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 She reports being impatient, becoming irritated with husband easily, having difficulty understanding people's conversations, pattern of interrupting conversations, being impulsive and saying things she thinks she shouldn't say, and a pattern of avoiding places.  ? ?Patient last was seen via virtual visit about 2 weeks ago.  She reports improved mood, increased behavioral activation, and increased socialization.  She expresses increased symptoms of her current condition.  She has been performing light household tasks/projects( ie.sorting and organizing dishes/jewelry)) within her capability and reports  doing well at pacing self.  She still experiences frustration at times as she is not able to do things like she has in the past.  Patient reports she and her husband socialized with other couples twice since last session.  She has gone out with friends twice.  She also has talked with family members on the phone twice since last session.  She also is pleased with her recent assertive conversation with her friend.  Patient reports becoming more aware of her thoughts and realizing she has a tendency to concentrate on the negative.  She also expresses frustration regarding multiple medical appointments. ?Suicidal/Homicidal: Nowithout intent/plan ? ?Therapist Response: reviewed symptoms, praised and reinforced patient's increased behavioral activation/socialization, discussed effects on mood and thoughts, praised and reinforced patient's increased acceptance/efforts to pace self, discussed maintaining consistent efforts and realistic expectations of self, discussed stressors, facilitated expression of thoughts and feelings, validated feelings, assisted patient identify connection between thoughts/mood/behavior, assisted patient identify ways to cope with multiple medical appointments by balancing with involvement in pleasant activities ? ?Diagnosis: Axis I: Mood disorder as late effect of TBI, Anxiety Disorder due to general medical condition ? ? Collaboration of Care: Other none needed at this session ? ?Patient/Guardian was advised Release of Information must be obtained prior to any record release in order to collaborate their care with an outside provider. Patient/Guardian was advised if they have not already done so to contact the registration department to sign all necessary forms in order for [place] to release information regarding their care.  ? ?Consent: Patient/Guardian gives verbal consent for treatment and assignment of benefits for services provided during this visit. Patient/Guardian expressed understanding  and agreed to proceed.   ? ? ? ?Lachina Salsberry E Clark Clowdus, LCSW ?08/02/2021 ? ? ? ? ? ?

## 2021-08-04 ENCOUNTER — Other Ambulatory Visit: Payer: Self-pay | Admitting: Primary Care

## 2021-08-04 DIAGNOSIS — E039 Hypothyroidism, unspecified: Secondary | ICD-10-CM

## 2021-08-11 ENCOUNTER — Ambulatory Visit: Payer: 59 | Admitting: Emergency Medicine

## 2021-08-11 ENCOUNTER — Encounter: Payer: Self-pay | Admitting: Emergency Medicine

## 2021-08-11 DIAGNOSIS — J4540 Moderate persistent asthma, uncomplicated: Secondary | ICD-10-CM | POA: Diagnosis not present

## 2021-08-11 NOTE — Progress Notes (Signed)
? ?Subjective:  ? ? Patient ID: Katie Robbins, female    DOB: 12/24/1955, 66 y.o.   MRN: [id number] ? ?HPI ? ?ROV 07/14/20 --follow-up visit for 66 year old woman with a history of asthma and associated hyperinflation, nutcracker esophagus, GERD.  She has been having increased dyspnea with exertion, sometimes associated with wheezing.  Also upper abdominal and chest discomfort, dysphagia associated with her esophageal disease, improved since she underwent dilation prior to her last visit.  She is following with GI. Also continues to have MSK sternal pain. She uses albuterol several times a day - unclear whether she gets significant benefit from it.  ? ?She underwent pulmonary function testing today which I have reviewed, shows grossly normal airflows but with mild obstruction based on a positive bronchodilator response.  Normal lung volumes.  Normal diffusion capacity.  No evidence of restriction or respiratory muscle weakness (history Guillain-Barr?) ? ?Chest x-ray 06/14/2020 reviewed, and was normal ? ? ?ROV 08/11/21 --follow-up visit 66 year old woman with a history of nutcracker esophagus and GERD with associated chest discomfort, documented mild obstruction consistent with mild intermittent asthma.  PMH also significant for Guillain-Barr? syndrome, hypertension, mitral valve prolapse, chronic headaches, anxiety. ?Today she reports that she has been doing pretty well. She uses albuterol most days, usually exposure related - her husband smokes. Sometimes her SOB is associated with anxiety. She does believe that the albuterol helps her. She has not missed the Symbicort. No flares, no pred or abx. She had a fall and a concussion 2 months ago, still has neck pain.  ? ? ?Past Medical History:  ?Diagnosis Date  ? Anxiety   ? Arthritis   ? Asthma   ? Chicken pox   ? Chronic headaches   ? Diverticulitis   ? Essential hypertension   ? GERD (gastroesophageal reflux disease)   ? Guillain Barr? syndrome ([place]) 1982  ? H/O breast  implant   ? Heart murmur   ? Hypertension   ? Hypothyroidism   ? Interstitial cystitis   ? Interstitial cystitis   ? Mitral prolapse 1985  ? Nutcracker esophagus   ? ? ? ?Review of Systems  ?Constitutional:  Negative for fever and unexpected weight change.  ?HENT:  Negative for congestion, dental problem, ear pain, nosebleeds, postnasal drip, rhinorrhea, sinus pressure, sneezing, sore throat and trouble swallowing.   ?Eyes:  Negative for redness and itching.  ?Respiratory:  Positive for cough, chest tightness and shortness of breath. Negative for wheezing.   ?Cardiovascular:  Negative for palpitations and leg swelling.  ?Gastrointestinal:  Negative for nausea and vomiting.  ?Genitourinary:  Negative for dysuria.  ?Musculoskeletal:  Negative for joint swelling.  ?Skin:  Negative for rash.  ?Neurological:  Negative for headaches.  ?Hematological:  Does not bruise/bleed easily.  ?Psychiatric/Behavioral:  Negative for dysphoric mood. The patient is not nervous/anxious.   ? ? ?   ?Objective:  ? Physical Exam ?Vitals:  ? 08/11/21 1441  ?BP: 124/76  ?Pulse: 87  ?Temp: 98.1 ?F (36.7 ?C)  ?TempSrc: Oral  ?SpO2: 100%  ?Weight: 140 lb 3.2 oz (63.6 kg)  ?Height: '5\' 2"'$  (1.575 m)  ? ?Gen: Pleasant, well-nourished, in no distress, scattered affect ? ?ENT: No lesions,  mouth clear,  oropharynx clear, no postnasal drip ? ?Neck: No JVD, no stridor ? ?Lungs: No use of accessory muscles, no wheeze or crackles ? ?Cardiovascular: RRR, heart sounds normal, no murmur or gallops, no peripheral edema ? ?Musculoskeletal: Sternal pain was not assessed today ? ?Neuro: alert, tangential, ? Some  cognitive impairment ? ?Skin: Warm, no lesions or rash ? ? ?   ?Assessment & Plan:  ?Asthma ?Mild intermittent asthma.  She does use albuterol almost every day, often associated with exposures.  Somewhat sedentary lifestyle but able to exert and carry out her usual daily activities.  No flares.  Occasional cough, occasional wheeze.  Some of her dyspnea is  associated with anxiety and panic attacks.  She is working to manage this. ? ?Please continue usual albuterol 2 puffs up to every 4 hours if needed for shortness of breath, chest tightness, wheezing. ?We do not need to start a scheduled inhaled medication at this time. ?Please contact us if you have any breathing problems or respiratory symptoms. ?Follow Dr. Lamonte Sakai in 1 year or sooner if needed. ? ?Baltazar Apo, MD, PhD ?08/11/2021, 2:58 PM ?[facility] ?[phone] or if no answer [phone] ? ?

## 2021-08-11 NOTE — Assessment & Plan Note (Signed)
Mild intermittent asthma.  She does use albuterol almost every day, often associated with exposures.  Somewhat sedentary lifestyle but able to exert and carry out her usual daily activities.  No flares.  Occasional cough, occasional wheeze.  Some of her dyspnea is associated with anxiety and panic attacks.  She is working to manage this. ? ?Please continue usual albuterol 2 puffs up to every 4 hours if needed for shortness of breath, chest tightness, wheezing. ?We do not need to start a scheduled inhaled medication at this time. ?Please contact us if you have any breathing problems or respiratory symptoms. ?Follow Dr. Lamonte Sakai in 1 year or sooner if needed. ?

## 2021-08-11 NOTE — Patient Instructions (Addendum)
Please continue usual albuterol 2 puffs up to every 4 hours if needed for shortness of breath, chest tightness, wheezing. ?We do not need to start a scheduled inhaled medication at this time. ?Please contact us if you have any breathing problems or respiratory symptoms. ?Follow Dr. Lamonte Sakai in 1 year or sooner if needed. ?

## 2021-08-16 ENCOUNTER — Ambulatory Visit (INDEPENDENT_AMBULATORY_CARE_PROVIDER_SITE_OTHER): Payer: 59 | Admitting: Psychiatry

## 2021-08-16 DIAGNOSIS — F410 Panic disorder [episodic paroxysmal anxiety] without agoraphobia: Secondary | ICD-10-CM

## 2021-08-16 DIAGNOSIS — S069XAS Unspecified intracranial injury with loss of consciousness status unknown, sequela: Secondary | ICD-10-CM | POA: Diagnosis not present

## 2021-08-16 DIAGNOSIS — F0630 Mood disorder due to known physiological condition, unspecified: Secondary | ICD-10-CM | POA: Diagnosis not present

## 2021-08-16 NOTE — Progress Notes (Signed)
Virtual Visit via Video Note ? ?I connected with [name] on 08/16/21 at 2:10 PM EDT by a video enabled telemedicine application and verified that I am speaking with the correct person using two identifiers. ? ?Location: ?Patient: Home ?Provider: [facility] [place] office  ?  ?I discussed the limitations of evaluation and management by telemedicine and the availability of in person appointments. The patient expressed understanding and agreed to proceed. ? ?. ? ?I provided 49 minutes of non-face-to-face time during this encounter. ? ? ?Fermin Yan E Schyler Butikofer, LCSW ? ?THERAPIST PROGRESS NOTE ? ?Session Time: Wednesday 08/16/2021 2:10 PM - 2:59 PM  ? ?Participation Level: Active ? ?Behavioral Response: Alert/Anxious/Talkative/tangentiality  ? ?Type of Therapy: Individual Therapy ? ?Treatment Goals addressed: Alleviate symptoms of depression AEB resuming normal interest in socialization by having phone contact and in person socialization with friends or family 3 times per week for 60 days per patient's report  ? ?Progress on goals: Progressing ? ? ?Interventions: CBT and Supportive ? ?Summary: Rosie Torrez is a 66 y.o. female who is referred for services for continuity of care while her previous therapist is on medical leave.  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 She reports being impatient, becoming irritated with husband easily, having difficulty understanding people's conversations, pattern of interrupting conversations, being impulsive and saying things she thinks she shouldn't say, and a pattern of avoiding places.  ? ?Patient last was seen via virtual visit about 2 weeks ago.  She reports increased depressed mood since last session.  Triggers appear to be increased neck pain and sleep difficulty.  However, patient has continued to maintain involvement in activities although she has had to modify some of her activities due to limited neck movement.  She had  an MRI yesterday and hopes to get results next week.  Patient reports she did not feel like going out doing any activities with friends due to the pain.  However, she did have phone contact with family members.  She talked with her brothers 3 times and her grandson 1 time via phone.  She reports noticing increased negative thoughts when experiencing pain and depressed mood. ? ?Suicidal/Homicidal: Nowithout intent/plan ? ?Therapist Response: reviewed symptoms, assisted patient identify triggers of increased depressed mood, praised and reinforced patient's continued behavioral activation/socialization, discussed effects on mood and thoughts, discussed maintaining consistent efforts and realistic expectations of self, began to assist patient identify triggers of negative thoughts, began to assist patient examine her thoughts, will continue next session  ? ?Diagnosis: Axis I: Mood disorder as late effect of TBI, Anxiety Disorder due to general medical condition ? ? Collaboration of Care: Other none needed at this session ? ?Patient/Guardian was advised Release of Information must be obtained prior to any record release in order to collaborate their care with an outside provider. Patient/Guardian was advised if they have not already done so to contact the registration department to sign all necessary forms in order for [place] to release information regarding their care.  ? ?Consent: Patient/Guardian gives verbal consent for treatment and assignment of benefits for services provided during this visit. Patient/Guardian expressed understanding and agreed to proceed.   ? ? ? ?[name], LCSW ?08/16/2021 ? ? ? ? ? ?

## 2021-08-25 ENCOUNTER — Other Ambulatory Visit: Payer: Self-pay | Admitting: Primary Care

## 2021-08-25 DIAGNOSIS — E538 Deficiency of other specified B group vitamins: Secondary | ICD-10-CM

## 2021-08-25 DIAGNOSIS — E039 Hypothyroidism, unspecified: Secondary | ICD-10-CM

## 2021-08-28 ENCOUNTER — Telehealth: Payer: Self-pay | Admitting: Primary Care

## 2021-08-28 DIAGNOSIS — E039 Hypothyroidism, unspecified: Secondary | ICD-10-CM

## 2021-08-28 MED ORDER — LEVOTHYROXINE SODIUM 100 MCG PO TABS: ORAL_TABLET | ORAL | 0 refills | Status: DC

## 2021-08-28 NOTE — Addendum Note (Signed)
Addended by: Pleas Koch on: 08/28/2021 05:22 PM ? ? Modules accepted: Orders ? ?

## 2021-08-28 NOTE — Telephone Encounter (Signed)
?  Encourage patient to contact the pharmacy for refills or they can request refills through [facility] ? ?LAST APPOINTMENT DATE:  Please schedule appointment if longer than 1 year ? ?NEXT APPOINTMENT DATE: ? ?MEDICATION:levothyroxine (SYNTHROID) 100 MCG tablet ? ?Is the patient out of medication?  ? ?[place], [place] ? ?Let patient know to contact pharmacy at the end of the day to make sure medication is ready. ? ?Please notify patient to allow 48-72 hours to process ? ?CLINICAL FILLS OUT ALL BELOW:  ? ?LAST REFILL: ? ?QTY: ? ?REFILL DATE: ? ? ? ?OTHER COMMENTS:  ? ? ?Okay for refill? ? ?Please advise ? ? ?  ?

## 2021-08-28 NOTE — Telephone Encounter (Signed)
Refills sent to pharmacy. 

## 2021-08-30 ENCOUNTER — Ambulatory Visit (INDEPENDENT_AMBULATORY_CARE_PROVIDER_SITE_OTHER): Payer: 59 | Admitting: Psychiatry

## 2021-08-30 DIAGNOSIS — F410 Panic disorder [episodic paroxysmal anxiety] without agoraphobia: Secondary | ICD-10-CM | POA: Diagnosis not present

## 2021-08-30 DIAGNOSIS — F0630 Mood disorder due to known physiological condition, unspecified: Secondary | ICD-10-CM

## 2021-08-30 DIAGNOSIS — S069XAS Unspecified intracranial injury with loss of consciousness status unknown, sequela: Secondary | ICD-10-CM

## 2021-08-30 NOTE — Progress Notes (Signed)
Virtual Visit via Video Note ? ?I connected with Katie Robbins on 08/30/21 at 2:04 PM EDT by a video enabled telemedicine application and verified that I am speaking with the correct person using two identifiers. ? ?Location: ?Patient: Home ?Provider: [facility] [place] office  ?  ?I discussed the limitations of evaluation and management by telemedicine and the availability of in person appointments. The patient expressed understanding and agreed to proceed. ? ?I provided 51 minutes of non-face-to-face time during this encounter. ? ? ?Lekeith Wulf E Keyetta Hollingworth, LCSW ? ?THERAPIST PROGRESS NOTE ? ?Session Time: Wednesday 08/30/2021 2:04 PM  - 2:55 PM  ? ?Participation Level: Active ? ?Behavioral Response: Alert/Anxious/Talkative/tangentiality  ? ?Type of Therapy: Individual Therapy ? ?Treatment Goals addressed: Alleviate symptoms of depression AEB resuming normal interest in socialization by having phone contact and in person socialization with friends or family 3 times per week for 60 days per patient's report  ? ?Progress on goals: Progressing ? ? ?Interventions: CBT and Supportive ? ?Summary: Katie Robbins is a 66 y.o. female who is referred for services for continuity of care while her previous therapist is on medical leave.  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 She reports being impatient, becoming irritated with husband easily, having difficulty understanding people's conversations, pattern of interrupting conversations, being impulsive and saying things she thinks she shouldn't say, and a pattern of avoiding places.  ? ?Patient last was seen via virtual visit about 2 weeks ago.  She reports continued depressed mood since last session.  Trigger remains neck pain. Patient has continued to maintain involvement in activities but appears to have to push self more to leave the house. She reports worry thoughts of what if regarding the healing process for her neck and  shoulder pain.  She fears she may have to endure significant pain for the rest of her life and she will not be able to return to the level of functioning she had prior to the concussion she sustained in [place].  She is scheduled to see an orthopedist May 15 to discuss treatment options for her shoulder and neck pain.  Patient has continued to socialize with family members via phone 2-3 times per week.   ? ?Suicidal/Homicidal: Nowithout intent/plan ? ?Therapist Response: reviewed symptoms, praised and reinforced patient's continued behavioral activation/socialization, discussed effects on mood and thoughts, discussed stressors, facilitated expression of thoughts and feelings, validated feelings, assisted patient examine/explore her what if thoughts to help decrease anxiety and worry, assisted patient identify ways to pace self regarding pain by identifying activities to pursue at various levels of pain ? ?Diagnosis: Axis I: Mood disorder as late effect of TBI, Anxiety Disorder due to general medical condition ? ? Collaboration of Care: Other none needed at this session ? ?Patient/Guardian was advised Release of Information must be obtained prior to any record release in order to collaborate their care with an outside provider. Patient/Guardian was advised if they have not already done so to contact the registration department to sign all necessary forms in order for [place] to release information regarding their care.  ? ?Consent: Patient/Guardian gives verbal consent for treatment and assignment of benefits for services provided during this visit. Patient/Guardian expressed understanding and agreed to proceed.   ? ? ? ?Aniket Paye E Krissa Utke, LCSW ?08/30/2021 ? ? ? ? ? ? ?

## 2021-09-13 ENCOUNTER — Ambulatory Visit (HOSPITAL_COMMUNITY): Payer: 59 | Admitting: Psychiatry

## 2021-09-27 ENCOUNTER — Ambulatory Visit (HOSPITAL_COMMUNITY): Payer: 59 | Admitting: Psychiatry

## 2021-09-27 ENCOUNTER — Encounter (HOSPITAL_COMMUNITY): Payer: Self-pay

## 2021-10-04 ENCOUNTER — Encounter: Payer: Self-pay | Admitting: Obstetrics and Gynecology

## 2021-10-11 ENCOUNTER — Telehealth: Payer: Self-pay

## 2021-10-11 ENCOUNTER — Ambulatory Visit (INDEPENDENT_AMBULATORY_CARE_PROVIDER_SITE_OTHER): Payer: 59 | Admitting: Psychiatry

## 2021-10-11 DIAGNOSIS — S069XAS Unspecified intracranial injury with loss of consciousness status unknown, sequela: Secondary | ICD-10-CM

## 2021-10-11 DIAGNOSIS — F410 Panic disorder [episodic paroxysmal anxiety] without agoraphobia: Secondary | ICD-10-CM

## 2021-10-11 DIAGNOSIS — F0630 Mood disorder due to known physiological condition, unspecified: Secondary | ICD-10-CM

## 2021-10-11 DIAGNOSIS — F411 Generalized anxiety disorder: Secondary | ICD-10-CM | POA: Diagnosis not present

## 2021-10-11 NOTE — Telephone Encounter (Signed)
Error

## 2021-10-11 NOTE — Progress Notes (Signed)
Virtual Visit via Video Note  I connected with Katie Robbins on 10/11/21 at 11:00 AM EDT by a video enabled telemedicine application and verified that I am speaking with the correct person using two identifiers.  Location: Patient: Home Provider: [place] office    I discussed the limitations of evaluation and management by telemedicine and the availability of in person appointments. The patient expressed understanding and agreed to proceed.    I provided 49 minutes of non-face-to-face time during this encounter.   [name], LCSW  THERAPIST PROGRESS NOTE  Session Time: Wednesday 10/11/2021 11:00 AM - 11:49 AM   Participation Level: Active  Behavioral Response: Alert/Anxious/Talkative/tangentiality   Type of Therapy: Individual Therapy  Treatment Goals addressed: Alleviate symptoms of depression AEB resuming normal interest in socialization by having phone contact and in person socialization with friends or family 3 times per week for 60 days per patient's report   Progress on goals: Progressing   Interventions: CBT and Supportive  Summary: Katie Robbins is a 66 y.o. female who is referred for services for continuity of care while her previous therapist is on medical leave.  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 She reports being impatient, becoming irritated with husband easily, having difficulty understanding people's conversations, pattern of interrupting conversations, being impulsive and saying things she thinks she shouldn't say, and a pattern of avoiding places.   Patient last was seen via virtual visit about 5 weeks ago.  She reports improved mood and doing well since last session.  She reports coping better with pain by pacing self.  She has resumed normal interest/involvement in activities such as cooking, gardening, and doing light household tasks.she reports she has been enjoying sitting on her  deck, relaxing, and reading magazines.  She also enjoys watching movies.  She enjoyed a recent 3-day trip with husband to [place] to a conference.  She reports frequent phone contact with her children as well as her sister-in-law.  She has enjoyed socialization at her home about 1 time per week with friends who visit.  She continues to experience anxiety while driving but expresses some acceptance of this due to health issues. Suicidal/Homicidal: Nowithout intent/plan  Therapist Response: reviewed symptoms, praised and reinforced patient's increased behavioral activation/socialization, discussed effects on mood and thoughts, encouraged patient to maintain consistent efforts, also began to discuss next steps for treatment    Diagnosis: Axis I: Mood disorder as late effect of TBI, Anxiety Disorder due to general medical condition   Collaboration of Care: Other none needed at this session  Patient/Guardian was advised Release of Information must be obtained prior to any record release in order to collaborate their care with an outside provider. Patient/Guardian was advised if they have not already done so to contact the registration department to sign all necessary forms in order for [place] to release information regarding their care.   Consent: Patient/Guardian gives verbal consent for treatment and assignment of benefits for services provided during this visit. Patient/Guardian expressed understanding and agreed to proceed.      [name], LCSW 10/11/2021         [name], LCSW

## 2021-10-13 ENCOUNTER — Other Ambulatory Visit: Payer: Self-pay | Admitting: Primary Care

## 2021-10-13 DIAGNOSIS — E039 Hypothyroidism, unspecified: Secondary | ICD-10-CM

## 2021-10-24 ENCOUNTER — Other Ambulatory Visit: Payer: Self-pay | Admitting: Primary Care

## 2021-10-24 DIAGNOSIS — E039 Hypothyroidism, unspecified: Secondary | ICD-10-CM

## 2021-10-25 ENCOUNTER — Ambulatory Visit (INDEPENDENT_AMBULATORY_CARE_PROVIDER_SITE_OTHER): Payer: 59 | Admitting: Psychiatry

## 2021-10-25 DIAGNOSIS — F411 Generalized anxiety disorder: Secondary | ICD-10-CM

## 2021-10-25 DIAGNOSIS — F064 Anxiety disorder due to known physiological condition: Secondary | ICD-10-CM | POA: Diagnosis not present

## 2021-10-25 DIAGNOSIS — F0630 Mood disorder due to known physiological condition, unspecified: Secondary | ICD-10-CM

## 2021-10-25 DIAGNOSIS — S069XAS Unspecified intracranial injury with loss of consciousness status unknown, sequela: Secondary | ICD-10-CM

## 2021-10-25 NOTE — Progress Notes (Signed)
Virtual Visit via Video Note  I connected with [name] on 10/25/21 at 2:12 PM EDT  by a video enabled telemedicine application and verified that I am speaking with the correct person using two identifiers.  Location: Patient: Home Provider: [place] office    I discussed the limitations of evaluation and management by telemedicine and the availability of in person appointments. The patient expressed understanding and agreed to proceed.  I provided 43 minutes of non-face-to-face time during this encounter.   [name], LCSW  THERAPIST PROGRESS NOTE  Session Time: Wednesday 10/25/2021 2:12 PM - 2:55 PM   Participation Level: Active  Behavioral Response: Alert/Anxious/Talkative/tangentiality   Type of Therapy: Individual Therapy  Treatment Goals addressed: Alleviate symptoms of depression AEB resuming normal interest in socialization by having phone contact and in person socialization with friends or family 3 times per week for 60 days per patient's report   Progress on goals: Progressing   Interventions: CBT and Supportive  Summary: Quanna Wittke is a 66 y.o. female who is referred for services for continuity of care while her previous therapist is on medical leave.  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 She reports being impatient, becoming irritated with husband easily, having difficulty understanding people's conversations, pattern of interrupting conversations, being impulsive and saying things she thinks she shouldn't say, and a pattern of avoiding places.   Patient last was seen via virtual visit about 2 weeks ago.  She reports continued improved mood and doing well since last session.  She maintains normal interest/involvement in activities such as cooking, gardening, and doing light household tasks.  She has increased social involvement including visiting a neighbor daily as he is recuperating from  surgery.  She also has been visiting a friend who also is recuperating from surgery.  Patient reports helping friend do light household tasks.  She reports continued improved interaction with her husband.  Patient reports still experiencing anxiety when driving to [facility] but has been able to manage.  She uses deep breathing techniques.  She feels more comfortable driving in areas close to her home and reports being okay with this.  Patient is pleased with her progress in treatment and expresses confidence in her ability to use coping strategies.  Suicidal/Homicidal: Nowithout intent/plan  Therapist Response: reviewed symptoms, praised and reinforced patient's continued behavioral activation/socialization, discussed effects on mood, encouraged patient to maintain consistent efforts, discussed patient's progress in treatment, discussed stepdown plan to termination to include 2 more sessions focusing on relapse prevention strategies, processed patient's feelings about termination   Diagnosis: Axis I: Mood disorder as late effect of TBI, Anxiety Disorder due to general medical condition   Collaboration of Care: Other none needed at this session  Patient/Guardian was advised Release of Information must be obtained prior to any record release in order to collaborate their care with an outside provider. Patient/Guardian was advised if they have not already done so to contact the registration department to sign all necessary forms in order for [place] to release information regarding their care.   Consent: Patient/Guardian gives verbal consent for treatment and assignment of benefits for services provided during this visit. Patient/Guardian expressed understanding and agreed to proceed.      [name], LCSW 10/25/2021         [name], LCSW

## 2021-10-26 ENCOUNTER — Other Ambulatory Visit (INDEPENDENT_AMBULATORY_CARE_PROVIDER_SITE_OTHER): Payer: 59

## 2021-10-26 DIAGNOSIS — E039 Hypothyroidism, unspecified: Secondary | ICD-10-CM | POA: Diagnosis not present

## 2021-10-26 LAB — TSH: TSH: 0.14 u[IU]/mL — ABNORMAL LOW (ref 0.35–5.50)

## 2021-10-28 MED ORDER — LEVOTHYROXINE SODIUM 112 MCG PO TABS: ORAL_TABLET | ORAL | 0 refills | Status: DC

## 2021-11-02 ENCOUNTER — Other Ambulatory Visit: Payer: Self-pay | Admitting: Primary Care

## 2021-11-02 DIAGNOSIS — E039 Hypothyroidism, unspecified: Secondary | ICD-10-CM

## 2021-11-06 ENCOUNTER — Other Ambulatory Visit: Payer: Self-pay | Admitting: Primary Care

## 2021-11-06 DIAGNOSIS — E039 Hypothyroidism, unspecified: Secondary | ICD-10-CM

## 2021-11-08 ENCOUNTER — Other Ambulatory Visit: Payer: Self-pay | Admitting: Primary Care

## 2021-11-08 DIAGNOSIS — E039 Hypothyroidism, unspecified: Secondary | ICD-10-CM

## 2021-11-08 MED ORDER — LEVOTHYROXINE SODIUM 100 MCG PO TABS: ORAL_TABLET | ORAL | 0 refills | Status: DC

## 2021-11-09 ENCOUNTER — Ambulatory Visit (INDEPENDENT_AMBULATORY_CARE_PROVIDER_SITE_OTHER): Payer: 59 | Admitting: Psychiatry

## 2021-11-09 DIAGNOSIS — F0630 Mood disorder due to known physiological condition, unspecified: Secondary | ICD-10-CM

## 2021-11-09 DIAGNOSIS — F064 Anxiety disorder due to known physiological condition: Secondary | ICD-10-CM | POA: Diagnosis not present

## 2021-11-09 DIAGNOSIS — F411 Generalized anxiety disorder: Secondary | ICD-10-CM | POA: Diagnosis not present

## 2021-11-09 DIAGNOSIS — S069XAS Unspecified intracranial injury with loss of consciousness status unknown, sequela: Secondary | ICD-10-CM

## 2021-11-09 NOTE — Progress Notes (Signed)
Virtual Visit via Video Note  I connected with Katie Robbins on 11/09/21 at 1:07 PM EDT by a video enabled telemedicine application and verified that I am speaking with the correct person using two identifiers.  Location: Patient: Home Provider: [place] office    I discussed the limitations of evaluation and management by telemedicine and the availability of in person appointments. The patient expressed understanding and agreed to proceed.   I provided 51 minutes of non-face-to-face time during this encounter.   [name], LCSW  THERAPIST PROGRESS NOTE  Session Time: Thursday 11/09/2021 1:07 PM 1:58 PM   Participation Level: Active  Behavioral Response: Alert/Anxious/Talkative/tangentiality   Type of Therapy: Individual Therapy  Treatment Goals addressed: Alleviate symptoms of depression AEB resuming normal interest in socialization by having phone contact and in person socialization with friends or family 3 times per week for 60 days per patient's report   Progress on goals: Progressing   Interventions: CBT and Supportive  Summary: Katie Robbins is a 66 y.o. female who is referred for services for continuity of care while her previous therapist is on medical leave.  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 She reports being impatient, becoming irritated with husband easily, having difficulty understanding people's conversations, pattern of interrupting conversations, being impulsive and saying things she thinks she shouldn't say, and a pattern of avoiding places.   Patient last was seen via virtual visit about 2 weeks ago.  She reports increased stress but coping well since last session.  Per her report, her husband had problems with his back about a week and a half ago.  Since that time, he has experienced pain and decreased mobility.  Patient reports sometimes feeling anxious or depressed as he has been her  caretaker but then using helpful coping statements so she can take care of him.  She reports becoming irritable once since last session and using a harsh tone of voice with husband.  However, she recognized this and apologized to husband.  She has been trying to pace self and maintain positive self-care.She remains involved in activities such as light household cleaning, cooking, and gardening.  She also continues to enjoy being on her deck.  .  Suicidal/Homicidal: Nowithout intent/plan  Therapist Response: reviewed symptoms, discussed stressors, facilitated expression of thoughts and feelings, validated feelings, reviewed early warning signs of depression, assisted patient identify/challenge/and replace thoughts evoking fear, depression, and anxiety about husband's situation with more helpful statements, praised and reinforced patient's recognition of voice tone and efforts to manage, assisted patient identify realistic expectations of self and ways to adjust to husband's situation, reviewed relaxation techniques to cope with anxiety and muscle tension, processed patient's feelings about termination, developed plan with patient to complete mental health maintenance plan in preparation for next session, will send patient handout via mail    Diagnosis: Axis I: Mood disorder as late effect of TBI, Anxiety Disorder due to general medical condition   Collaboration of Care: Other none needed at this session  Patient/Guardian was advised Release of Information must be obtained prior to any record release in order to collaborate their care with an outside provider. Patient/Guardian was advised if they have not already done so to contact the registration department to sign all necessary forms in order for [place] to release information regarding their care.   Consent: Patient/Guardian gives verbal consent for treatment and assignment of benefits for services provided during this visit. Patient/Guardian  expressed  understanding and agreed to proceed.      [name], LCSW 11/09/2021         [name], LCSW

## 2021-11-20 ENCOUNTER — Encounter: Payer: 59 | Admitting: Obstetrics and Gynecology

## 2021-11-21 ENCOUNTER — Telehealth: Payer: Self-pay | Admitting: Emergency Medicine

## 2021-11-21 NOTE — Telephone Encounter (Signed)
Called and spoke with pt letting her know the info stated about her inhalers from last OV. Pt stated that she has had to use her rescue inhaler at least 3 times and states that she still has tightness in chest. Stated to pt that we should get her scheduled for a visit and she verbalized understanding. Pt stated that she would not be able to come in for an appt tomorrow but could do a video visit so a mychart appt has been scheduled for pt with RB 7/12. Nothing further needed.

## 2021-11-21 NOTE — Telephone Encounter (Signed)
Patient is returning phone call. Patient phone number is [phone].

## 2021-11-21 NOTE — Telephone Encounter (Signed)
Attempted to call pt but unable to reach. Left message for her to return call. 

## 2021-11-22 ENCOUNTER — Encounter: Payer: Self-pay | Admitting: Emergency Medicine

## 2021-11-22 ENCOUNTER — Telehealth (INDEPENDENT_AMBULATORY_CARE_PROVIDER_SITE_OTHER): Payer: 59 | Admitting: Emergency Medicine

## 2021-11-22 DIAGNOSIS — R0602 Shortness of breath: Secondary | ICD-10-CM

## 2021-11-22 NOTE — Patient Instructions (Signed)
We will plan to restart Symbicort 2 puffs twice a day. She has follow-up next week and we will determine whether the Symbicort is been beneficial.  Also need to evaluate neurological status at that visit, question component of neuromuscular weakness Question whether there is a component of respiratory muscle weakness here, Guillain-Barr.  She follows up with neurology but has not done so lately.  We will work on getting her an office visit

## 2021-11-22 NOTE — Assessment & Plan Note (Signed)
We will plan to restart Symbicort 2 puffs twice a day. She has follow-up next week and we will determine whether the Symbicort is been beneficial.  Also need to evaluate neurological status at that visit, question component of neuromuscular weakness Question whether there is a component of respiratory muscle weakness here, Guillain-Barr.  She follows up with neurology but has not done so lately.  We will work on getting her an office visit

## 2021-11-22 NOTE — Progress Notes (Signed)
Virtual Visit via Video Note  I connected with Katie Robbins on 11/22/21 at 10:15 AM EDT by a video enabled telemedicine application and verified that I am speaking with the correct person using two identifiers.  Location: Patient: Home Provider: Office   I discussed the limitations of evaluation and management by telemedicine and the availability of in person appointments. The patient expressed understanding and agreed to proceed.  History of Present Illness: 66 year old woman with a history of GERD, nutcracker esophagus with associated chest discomfort.  She has mild intermittent asthma with documented obstruction on pulmonary function testing.  Also with a past medical history significant for Guillain-Barr syndrome, hypertension, MVP, chronic headaches, anxiety, hyperthyroidism.  Previously on Symbicort but we had stopped this.   Observations/Objective: She began to have progression of SOB about early May. She describes chest discomfort that sounds positional, noticeable when she lays flat. Difficulty shopping, climbing stairs. She has increased fatigue, difficulty climbing stairs. She hears some wheeze w exertion, increased cough. Uses albuterol more frequently - not always effective.  She has not seen Neurology Dr Melrose Nakayama lately   Assessment and Plan: We will plan to restart Symbicort 2 puffs twice a day. She has follow-up next week and we will determine whether the Symbicort is been beneficial.  Also need to evaluate neurological status at that visit, question component of neuromuscular weakness Question whether there is a component of respiratory muscle weakness here, Guillain-Barr.  She follows up with neurology but has not done so lately.  We will work on getting her an office visit  Follow Up Instructions: Next week   I discussed the assessment and treatment plan with the patient. The patient was provided an opportunity to ask questions and all were answered. The patient agreed  with the plan and demonstrated an understanding of the instructions.   The patient was advised to call back or seek an in-person evaluation if the symptoms worsen or if the condition fails to improve as anticipated.  I provided 31 minutes of non-face-to-face time during this encounter.   Collene Gobble, MD

## 2021-11-23 ENCOUNTER — Ambulatory Visit (INDEPENDENT_AMBULATORY_CARE_PROVIDER_SITE_OTHER): Payer: 59 | Admitting: Psychiatry

## 2021-11-23 DIAGNOSIS — S069XAD Unspecified intracranial injury with loss of consciousness status unknown, subsequent encounter: Secondary | ICD-10-CM | POA: Diagnosis not present

## 2021-11-23 DIAGNOSIS — F0630 Mood disorder due to known physiological condition, unspecified: Secondary | ICD-10-CM

## 2021-11-23 DIAGNOSIS — F411 Generalized anxiety disorder: Secondary | ICD-10-CM

## 2021-11-23 DIAGNOSIS — S069XAS Unspecified intracranial injury with loss of consciousness status unknown, sequela: Secondary | ICD-10-CM

## 2021-11-23 DIAGNOSIS — F064 Anxiety disorder due to known physiological condition: Secondary | ICD-10-CM | POA: Diagnosis not present

## 2021-11-23 NOTE — Progress Notes (Signed)
Virtual Visit via Video Note  I connected with Katie Robbins on 11/23/21 at 2:06 PM EDT by a video enabled telemedicine application and verified that I am speaking with the correct person using two identifiers.  Location: Patient: Home Provider: [place] office    I discussed the limitations of evaluation and management by telemedicine and the availability of in person appointments. The patient expressed understanding and agreed to proceed.   I provided 52 minutes of non-face-to-face time during this encounter.   [name], LCSW  THERAPIST PROGRESS NOTE  Session Time: Thursday 11/23/2021 2:06 PM - 2:58 PM   Participation Level: Active  Behavioral Response: Alert/Anxious/Talkative/tangentiality   Type of Therapy: Individual Therapy  Treatment Goals addressed: Alleviate symptoms of depression AEB resuming normal interest in socialization by having phone contact and in person socialization with friends or family 3 times per week for 60 days per patient's report   Progress on goals: Progressing   Interventions: CBT and Supportive  Summary: Katie Robbins is a 66 y.o. female who is referred for services for continuity of care while her previous therapist is on medical leave.  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 She reports being impatient, becoming irritated with husband easily, having difficulty understanding people's conversations, pattern of interrupting conversations, being impulsive and saying things she thinks she shouldn't say, and a pattern of avoiding places.   Patient last was seen via virtual visit about 2 weeks ago.  She reports continuing to do well since last session.  She reports feeling better physically as she recently had a neck ablation and now is experiencing less pain as well as improved sleep.  She maintains involvement in activities as well as socialization.  She reports continued positive  relationship and communication with husband.  She is very pleased with her use of assertiveness skills.  Patient is pleased with her progress in treatment and expresses confidence in her abilities to successfully use coping skills.  She continues to experience anxiety at times but is managing it well.  Patient reports completing mental health maintenance plan along with her husband in preparation for today's session.   Suicidal/Homicidal: Nowithout intent/plan  Therapist Response: reviewed symptoms, discussed patient's progress in treatment, praised patient's efforts to complete mental health maintenance plan, discussed and reviewed maintenance plan with patient, did termination, encouraged patient to contact this practice should she need psychotherapy services in the future.Therapist also discussed with patient her wishes regarding journal sent to patient's previous therapist and then forwarded to this therapist.  Patient advised therapist to shred.    Diagnosis: Axis I: Mood disorder as late effect of TBI, Anxiety Disorder due to general medical condition   Collaboration of Care: Other none needed at this session  Patient/Guardian was advised Release of Information must be obtained prior to any record release in order to collaborate their care with an outside provider. Patient/Guardian was advised if they have not already done so to contact the registration department to sign all necessary forms in order for [place] to release information regarding their care.   Consent: Patient/Guardian gives verbal consent for treatment and assignment of benefits for services provided during this visit. Patient/Guardian expressed understanding and agreed to proceed.      [name], LCSW 11/23/2021    Outpatient Therapist Discharge Summary  Katie Robbins    10-May-1956   Admission Date: 07/21/2019 Discharge Date:  11/23/2021 Reason for Discharge:  Pt has accomplished  her goals Diagnosis:  Axis I:  Mood  disorder as late effect of traumatic brain injury [facility])   Comments: Patient will continue to see her psychiatrist for medication management.  She is encouraged to call this practice should she need psychotherapy services in the future.  Katie Robbins Katie Yifan Auker LCSW

## 2021-11-23 NOTE — Plan of Care (Signed)
Pt has met goals Problem: Depression CCP Problem  depressed mood, worry, social withdrawal Goal:  Alleviate symptoms of depression AEB resuming normal interest in socialization by having phone contact and in-person socialization with friends or family 3 x per week for 60 days per pt's report. Outcome: Completed/Met Goal: STG: [place] WILL IDENTIFY 3 COGNITIVE PATTERNS AND BELIEFS THAT SUPPORT DEPRESSION Outcome: Completed/Met

## 2021-11-29 ENCOUNTER — Other Ambulatory Visit: Payer: Self-pay | Admitting: Primary Care

## 2021-11-29 DIAGNOSIS — I10 Essential (primary) hypertension: Secondary | ICD-10-CM

## 2021-11-29 DIAGNOSIS — E039 Hypothyroidism, unspecified: Secondary | ICD-10-CM

## 2021-11-30 ENCOUNTER — Encounter: Payer: Self-pay | Admitting: Emergency Medicine

## 2021-11-30 ENCOUNTER — Ambulatory Visit: Payer: 59 | Admitting: Emergency Medicine

## 2021-11-30 DIAGNOSIS — J4540 Moderate persistent asthma, uncomplicated: Secondary | ICD-10-CM | POA: Diagnosis not present

## 2021-11-30 NOTE — Assessment & Plan Note (Signed)
Clinical response to readdition of Symbicort although she had reassuring spirometry in the past.  I will repeat her pulmonary function testing.  Plan to continue the Symbicort on a schedule.  Keep albuterol available to use if needed.  Unclear based on exam and history whether there is a component of neuromuscular weakness, but the response to readdition of Symbicort is reassuring.  Please continue Symbicort 2 puffs twice a day.  Rinse and gargle after using. Keep albuterol available to use 2 puffs when you needed for shortness of breath, chest tightness, wheezing. We will perform pulmonary function testing in next office visit. Follow Dr. Lamonte Sakai next available with full PFT on the same day.

## 2021-11-30 NOTE — Addendum Note (Signed)
Addended by: Gavin Potters R on: 11/30/2021 04:30 PM   Modules accepted: Orders

## 2021-11-30 NOTE — Progress Notes (Signed)
Subjective:    Patient ID: Katie Robbins, female    DOB: 1955-10-27, 66 y.o.   MRN: [id number]  HPI  ROV 07/14/20 --follow-up visit for 66 year old woman with a history of asthma and associated hyperinflation, nutcracker esophagus, GERD.  She has been having increased dyspnea with exertion, sometimes associated with wheezing.  Also upper abdominal and chest discomfort, dysphagia associated with her esophageal disease, improved since she underwent dilation prior to her last visit.  She is following with GI. Also continues to have MSK sternal pain. She uses albuterol several times a day - unclear whether she gets significant benefit from it.   She underwent pulmonary function testing today which I have reviewed, shows grossly normal airflows but with mild obstruction based on a positive bronchodilator response.  Normal lung volumes.  Normal diffusion capacity.  No evidence of restriction or respiratory muscle weakness (history Guillain-Barr)  Chest x-ray 06/14/2020 reviewed, and was normal   ROV 08/11/21 --follow-up visit 66 year old woman with a history of nutcracker esophagus and GERD with associated chest discomfort, documented mild obstruction consistent with mild intermittent asthma.  PMH also significant for Guillain-Barr syndrome, hypertension, mitral valve prolapse, chronic headaches, anxiety. Today she reports that she has been doing pretty well. She uses albuterol most days, usually exposure related - her husband smokes. Sometimes her SOB is associated with anxiety. She does believe that the albuterol helps her. She has not missed the Symbicort. No flares, no pred or abx. She had a fall and a concussion 2 months ago, still has neck pain.   ROV 11/30/21 --follow-up visit 66 year old woman whom I have seen for mild intermittent asthma, documented obstruction on pulmonary function testing.  She also has a history of GERD with nutcracker esophagus associated chest discomfort, Guillain-Barr  syndrome, hypertension, MVP, anxiety, chronic headaches.  She has been on Symbicort in the past but we had stopped it successfully.  I then talked to her about 2 weeks ago when she was having progressive dyspnea, began in early May, some associated wheeze and increased coughing.  We decided to go back on the Symbicort and to then follow-up in office.  Today she reports that she is probably benefiting from the Symbicort - lungs feels more open, less albuterol use. Her cough has improved also.    Past Medical History:  Diagnosis Date   Anxiety    Arthritis    Asthma    Chicken pox    Chronic headaches    Diverticulitis    Essential hypertension    GERD (gastroesophageal reflux disease)    Guillain Barr syndrome ([place]) 1982   H/O breast implant    Heart murmur    Hypertension    Hypothyroidism    Interstitial cystitis    Interstitial cystitis    Mitral prolapse 1985   Nutcracker esophagus      Review of Systems  Constitutional:  Negative for fever and unexpected weight change.  HENT:  Negative for congestion, dental problem, ear pain, nosebleeds, postnasal drip, rhinorrhea, sinus pressure, sneezing, sore throat and trouble swallowing.   Eyes:  Negative for redness and itching.  Respiratory:  Positive for cough, chest tightness and shortness of breath. Negative for wheezing.   Cardiovascular:  Negative for palpitations and leg swelling.  Gastrointestinal:  Negative for nausea and vomiting.  Genitourinary:  Negative for dysuria.  Musculoskeletal:  Negative for joint swelling.  Skin:  Negative for rash.  Neurological:  Negative for headaches.  Hematological:  Does not bruise/bleed easily.  Psychiatric/Behavioral:  Negative  for dysphoric mood. The patient is not nervous/anxious.         Objective:   Physical Exam Vitals:   11/30/21 1540  BP: 120/70  Pulse: 84  Temp: 98.3 F (36.8 C)  TempSrc: Oral  SpO2: 98%  Weight: 138 lb 12.8 oz (63 kg)  Height: '5\' 2"'$  (1.575 m)    Gen: Pleasant, well-nourished, in no distress, scattered affect  ENT: No lesions,  mouth clear,  oropharynx clear, no postnasal drip  Neck: No JVD, no stridor  Lungs: No use of accessory muscles, no wheeze or crackles  Cardiovascular: RRR, heart sounds normal, no murmur or gallops, no peripheral edema  Musculoskeletal: Sternal pain was not assessed today  Neuro: alert, tangential, ? Some cognitive impairment  Skin: Warm, no lesions or rash      Assessment & Plan:  Asthma Clinical response to readdition of Symbicort although she had reassuring spirometry in the past.  I will repeat her pulmonary function testing.  Plan to continue the Symbicort on a schedule.  Keep albuterol available to use if needed.  Unclear based on exam and history whether there is a component of neuromuscular weakness, but the response to readdition of Symbicort is reassuring.  Please continue Symbicort 2 puffs twice a day.  Rinse and gargle after using. Keep albuterol available to use 2 puffs when you needed for shortness of breath, chest tightness, wheezing. We will perform pulmonary function testing in next office visit. Follow Dr. Lamonte Sakai next available with full PFT on the same day.  Baltazar Apo, MD, PhD 11/30/2021, 4:22 PM [facility] [phone] or if no answer [phone]

## 2021-11-30 NOTE — Patient Instructions (Signed)
Please continue Symbicort 2 puffs twice a day.  Rinse and gargle after using. Keep albuterol available to use 2 puffs when you needed for shortness of breath, chest tightness, wheezing. We will perform pulmonary function testing in next office visit. Follow Dr. Lamonte Sakai next available with full PFT on the same day.

## 2021-12-01 ENCOUNTER — Telehealth: Payer: Self-pay | Admitting: Primary Care

## 2021-12-01 NOTE — Telephone Encounter (Signed)
Patient called in stating she received a call from Endocrinology and she has an appointment Wednesday instead of having to wait until August. Katie Robbins!! She stated this is some good news to start your weekend and she hopes you have a great weekend. Thank you!

## 2021-12-05 NOTE — Progress Notes (Unsigned)
Name: Katie Robbins  MRN/ DOB: [id number], [birth year]    Age/ Sex: 66 y.o., female    PCP: Pleas Koch, NP   Reason for Endocrinology Evaluation: Hypothyroidism     Date of Initial Endocrinology Evaluation: 12/06/2021     HPI: [name] is a 66 y.o. female with a past medical history of HTN, asthma, hypothyroid, neuropathy,Hx Guillain-Barr syndrome (1982). The patient presented for initial endocrinology clinic visit on 12/06/2021 for consultative assistance with her Hypothyroidism.   She has been on levothyroxine 1994   Denies prior exposure to radiation and thyroid sx   She has a hx of neck fusions    Over the years she has had fluctuating TSH ranging from 0.11 uIU/mL in 07/2021 to 7.05 uIU/mL in 08/2019   Levothyroxine 112 mcg 2 days a week  Levothyroxine 100 mcg daily 5 days week  She has been on the above dose since 11/05/2021   Does not take Biotin  No vitamins   Weight has been fluctuating  She continues with multiple neurological symptoms, follows with neurology  Denies palpitations  Denies loose stools or diarrhea , has mainly constipation  Has right hand  tremors  She has weakness and fatigue  Uses pill box  She takes levothyroxine appropriately   Takes protonix at dinner time     Mother with PMR , giant cell arteritis and thyroid disease  HISTORY:  Past Medical History:  Past Medical History:  Diagnosis Date   Anxiety    Arthritis    Asthma    Chicken pox    Chronic headaches    Diverticulitis    Essential hypertension    GERD (gastroesophageal reflux disease)    Guillain Barr syndrome ([place]) 1982   H/O breast implant    Heart murmur    Hypertension    Hypothyroidism    Interstitial cystitis    Interstitial cystitis    Mitral prolapse 1985   Nutcracker esophagus    Past Surgical History:  Past Surgical History:  Procedure Laterality Date   ABDOMINAL HYSTERECTOMY  2000/2009   APPENDECTOMY  2007   AUGMENTATION MAMMAPLASTY      BOWEL RESECTION  2007   CARDIAC CATHETERIZATION  2016   Clean   CARPAL TUNNEL RELEASE Right    CHOLECYSTECTOMY  2012   CHOLECYSTECTOMY  2012   COLONOSCOPY WITH PROPOFOL N/A 04/13/2020   Procedure: COLONOSCOPY WITH PROPOFOL;  Surgeon: Lin Landsman, MD;  Location: [facility] ENDOSCOPY;  Service: Gastroenterology;  Laterality: N/A;   ESOPHAGOGASTRODUODENOSCOPY (EGD) WITH PROPOFOL N/A 04/13/2020   Procedure: ESOPHAGOGASTRODUODENOSCOPY (EGD) WITH PROPOFOL;  Surgeon: Lin Landsman, MD;  Location: [facility] ENDOSCOPY;  Service: Gastroenterology;  Laterality: N/A;   NECK SURGERY  06/11/2016   plates and screws in neck    SMALL INTESTINE SURGERY  2007   Bowel resection   TONSILLECTOMY     VESICO-VAGINAL FISTULA REPAIR  2007    Social History:  reports that she has never smoked. She has never used smokeless tobacco. She reports current alcohol use. She reports that she does not use drugs. Family History: family history includes COPD in her mother; GER disease in her father and mother; Heart attack in her father; Hypertension in her mother; Osteoarthritis in her mother; Osteoporosis in her mother; Other in her mother; Polymyalgia rheumatica in her mother.   HOME MEDICATIONS: Allergies as of 12/06/2021       Reactions   Bactrim [sulfamethoxazole-trimethoprim] Hives   Macrobid [nitrofurantoin Macrocrystal] Hives   Tizanidine  Other (See Comments)   Bad Mood changs   Vicodin [hydrocodone-acetaminophen] Swelling   Puffy eyes and face   Trileptal [oxcarbazepine] Rash   Acetaminophen    Other reaction(s): Puffy face/eyes   Hydrocodone    Nexium [esomeprazole Magnesium]    Face swelling, hives    Prilosec [omeprazole]    Face swelling, hives    Sulfa Antibiotics    Tamoxifen    Trimethoprim    Other reaction(s): Hives   Wasp Venom Hives   swelling   Erythromycin Rash   Lamotrigine Rash   Molds & Smuts Anxiety        Medication List        Accurate as of December 06, 2021  7:41  AM. If you have any questions, ask your nurse or doctor.          albuterol 108 (90 Base) MCG/ACT inhaler Commonly known as: VENTOLIN HFA INHALE 2 PUFFS INTO THE LUNGS EVERY 6 HOURS AS NEEDED FOR WHEEZING OR SHORTNESS OF BREATH.   amLODipine 5 MG tablet Commonly known as: NORVASC TAKE 1 TABLET BY MOUTH ONCE DAILY FOR BLOOD PRESSURE   amphetamine-dextroamphetamine 20 MG tablet Commonly known as: ADDERALL Take 20 mg by mouth in the morning, at noon, and at bedtime.   aspirin EC 81 MG tablet Take 81 mg by mouth daily.   cyanocobalamin 1000 MCG/ML injection Commonly known as: (VITAMIN B-12) Inject 1 vial into the muscle every 3-4 weeks.   diazepam 5 MG tablet Commonly known as: VALIUM Take 10 mg by mouth in the morning and at bedtime.   EPINEPHrine 0.3 mg/0.3 mL Soaj injection Commonly known as: EPI-PEN Inject 0.3 mLs (0.3 mg total) into the muscle as needed for anaphylaxis.   estradiol 1 MG tablet Commonly known as: ESTRACE Take 1 tablet by mouth daily.   levothyroxine 112 MCG tablet Commonly known as: SYNTHROID TAKE 1 TABLET BY MOUTH EVERY MORNING ON SATURDAY AND SUNDAY TAKE ON AN EMPTY STOMACH WITH WATER ONLY. NO FOOD OR OTHER MEDS FOR 30 MINS   levothyroxine 100 MCG tablet Commonly known as: SYNTHROID TAKE 1 TABLET BY MOUTH IN THE MORNING ON Monday through Friday ON EMPTY STOMACH WITH WATER ONLY AT LEAST 30-60 MINS BEFORE BREAKFAST   olmesartan 20 MG tablet Commonly known as: BENICAR TAKE 1 TABLET BY MOUTH TWICE A DAY FOR BLOOD PRESSURE   pantoprazole 40 MG tablet Commonly known as: PROTONIX Take 1 tablet (40 mg total) by mouth daily.          REVIEW OF SYSTEMS: A comprehensive ROS was conducted with the patient and is negative except as per HPI    OBJECTIVE:  VS: Ht '5\' 2"'$  (1.575 m)   Wt 138 lb 3.2 oz (62.7 kg)   BMI 25.28 kg/m    Wt Readings from Last 3 Encounters:  12/06/21 138 lb 3.2 oz (62.7 kg)  11/30/21 138 lb 12.8 oz (63 kg)  08/11/21 140  lb 3.2 oz (63.6 kg)     EXAM: General: Pt appears well and is in NAD  Neck: General: Supple without adenopathy. Thyroid: Thyroid size normal.  No goiter or nodules appreciated.  Lungs: Clear with good BS bilat with no rales, rhonchi, or wheezes  Heart: Auscultation: RRR.  Abdomen: Normoactive bowel sounds, soft, nontender, without masses or organomegaly palpable  Extremities:  BL LE: No pretibial edema normal ROM and strength.  Mental Status: Judgment, insight: Intact Orientation: Oriented to time, place, and person Mood and affect: No depression, anxiety, or agitation  DATA REVIEWED:     Latest Reference Range & Units 12/06/21 08:09  TSH 0.35 - 5.50 uIU/mL 1.80    ASSESSMENT/PLAN/RECOMMENDATIONS:   Hypothyroidism:  -Repeat TFTs are normal - Pt educated extensively on the correct way to take levothyroxine (first thing in the morning with water, 30 minutes before eating or taking other medications). - Pt encouraged to double dose the following day if she were to miss a dose given long half-life of levothyroxine. -No changes at this time, historically the patient has had variable TSH readings -We discussed that levothyroxine is one of the most difficult hormones to absorb and as long as she continues to take it appropriately then I suspect part of this is interference with gastric absorption   Medications : Continue levothyroxine 100 mcg x 5 days a week Continue levothyroxine 112 mcg x 2 days a week   Follow-up in 4 months Labs in 2 months  Signed electronically by: Mack Guise, MD  [facility]  [facility] [address] [place], [place] [address] Phone: [phone] FAX: [phone]   CC: Pleas Koch, NP [place] [address] Phone: [phone] Fax: [phone]   Return to Endocrinology clinic as below: Future Appointments  Date Time Provider [place]  12/06/2021  7:50  AM Elis Sauber, Melanie Crazier, MD [facility] None  12/12/2021  2:00 PM Pleas Koch, NP [facility]  12/12/2021  3:00 PM Pleas Koch, NP [facility]  01/02/2022 11:00 AM Harlin Heys, MD [facility] None  02/01/2022  3:00 PM [facility] None  02/01/2022  4:00 PM Byrum, Rose Fillers, MD [facility] None

## 2021-12-06 ENCOUNTER — Encounter: Payer: Self-pay | Admitting: Internal Medicine

## 2021-12-06 ENCOUNTER — Ambulatory Visit: Payer: 59 | Admitting: Internal Medicine

## 2021-12-06 VITALS — BP 132/84 | HR 87 | Ht 62.0 in | Wt 138.2 lb

## 2021-12-06 DIAGNOSIS — E039 Hypothyroidism, unspecified: Secondary | ICD-10-CM | POA: Diagnosis not present

## 2021-12-06 LAB — TSH: TSH: 1.8 u[IU]/mL (ref 0.35–5.50)

## 2021-12-06 MED ORDER — LEVOTHYROXINE SODIUM 112 MCG PO TABS: 112.0000 ug | ORAL_TABLET | ORAL | 1 refills | Status: DC

## 2021-12-06 MED ORDER — LEVOTHYROXINE SODIUM 100 MCG PO TABS: 100.0000 ug | ORAL_TABLET | ORAL | 1 refills | Status: DC

## 2021-12-06 NOTE — Patient Instructions (Signed)

## 2021-12-07 ENCOUNTER — Encounter: Payer: Self-pay | Admitting: Internal Medicine

## 2021-12-07 LAB — T4: T4, Total: 11.7 ug/dL (ref 5.1–11.9)

## 2021-12-12 ENCOUNTER — Encounter: Payer: Self-pay | Admitting: Primary Care

## 2021-12-12 ENCOUNTER — Encounter: Payer: 59 | Admitting: Primary Care

## 2021-12-12 ENCOUNTER — Ambulatory Visit: Payer: 59 | Admitting: Primary Care

## 2021-12-12 VITALS — BP 138/80 | HR 95 | Temp 97.9°F | Ht 62.0 in | Wt 138.0 lb

## 2021-12-12 DIAGNOSIS — F0630 Mood disorder due to known physiological condition, unspecified: Secondary | ICD-10-CM

## 2021-12-12 DIAGNOSIS — J4540 Moderate persistent asthma, uncomplicated: Secondary | ICD-10-CM

## 2021-12-12 DIAGNOSIS — I10 Essential (primary) hypertension: Secondary | ICD-10-CM

## 2021-12-12 DIAGNOSIS — E538 Deficiency of other specified B group vitamins: Secondary | ICD-10-CM | POA: Diagnosis not present

## 2021-12-12 DIAGNOSIS — E039 Hypothyroidism, unspecified: Secondary | ICD-10-CM

## 2021-12-12 DIAGNOSIS — S069XAS Unspecified intracranial injury with loss of consciousness status unknown, sequela: Secondary | ICD-10-CM

## 2021-12-12 DIAGNOSIS — N183 Chronic kidney disease, stage 3 unspecified: Secondary | ICD-10-CM

## 2021-12-12 DIAGNOSIS — R200 Anesthesia of skin: Secondary | ICD-10-CM | POA: Diagnosis not present

## 2021-12-12 DIAGNOSIS — R7303 Prediabetes: Secondary | ICD-10-CM | POA: Diagnosis not present

## 2021-12-12 DIAGNOSIS — R202 Paresthesia of skin: Secondary | ICD-10-CM

## 2021-12-12 DIAGNOSIS — E2839 Other primary ovarian failure: Secondary | ICD-10-CM

## 2021-12-12 DIAGNOSIS — M5010 Cervical disc disorder with radiculopathy, unspecified cervical region: Secondary | ICD-10-CM

## 2021-12-12 DIAGNOSIS — R32 Unspecified urinary incontinence: Secondary | ICD-10-CM

## 2021-12-12 DIAGNOSIS — M542 Cervicalgia: Secondary | ICD-10-CM

## 2021-12-12 DIAGNOSIS — Z0000 Encounter for general adult medical examination without abnormal findings: Secondary | ICD-10-CM | POA: Diagnosis not present

## 2021-12-12 DIAGNOSIS — R29898 Other symptoms and signs involving the musculoskeletal system: Secondary | ICD-10-CM | POA: Insufficient documentation

## 2021-12-12 DIAGNOSIS — K219 Gastro-esophageal reflux disease without esophagitis: Secondary | ICD-10-CM

## 2021-12-12 DIAGNOSIS — F0670 Mild neurocognitive disorder due to known physiological condition without behavioral disturbance: Secondary | ICD-10-CM

## 2021-12-12 DIAGNOSIS — N3010 Interstitial cystitis (chronic) without hematuria: Secondary | ICD-10-CM

## 2021-12-12 DIAGNOSIS — R5382 Chronic fatigue, unspecified: Secondary | ICD-10-CM

## 2021-12-12 NOTE — Assessment & Plan Note (Signed)
Chronic. Following with neurology.   Myasthenia Gravis testing negative from Fall 2022. Pulmonology suspects GBS to cause symptoms. She will discuss this with neurology at her upcoming visit.

## 2021-12-12 NOTE — Assessment & Plan Note (Signed)
Immunizations UTD. She can no longer receive vaccines due to her GBS.  Mammogram UTD. Colonoscopy UTD, due 2028. Bone density scan UTD.  Discussed the importance of a healthy diet and regular exercise in order for weight loss, and to reduce the risk of further co-morbidity.  Exam stable. Labs pending.  Follow up in 1 year for repeat physical.

## 2021-12-12 NOTE — Assessment & Plan Note (Signed)
Overall stable over the years. Repeat A1C pending.

## 2021-12-12 NOTE — Assessment & Plan Note (Signed)
Continue vitamin B12 injections every 3 weeks.  Repeat B12 levels pending.

## 2021-12-12 NOTE — Assessment & Plan Note (Signed)
Following with neuropsychiatry and therapy.  Continue Adderall 20 mg TID.

## 2021-12-12 NOTE — Assessment & Plan Note (Signed)
Following with GYN.  Continue estradiol 1 mg daily.

## 2021-12-12 NOTE — Assessment & Plan Note (Signed)
Following with neurology. Pulmonology suspects GBS is causing symptoms.  She will be seeing Neurology in September 2023.

## 2021-12-12 NOTE — Patient Instructions (Addendum)
Stop by the lab prior to leaving today. I will notify you of your results once received.   It was a pleasure to see you today!  Preventive Care 22 Years and Older, Female Preventive care refers to lifestyle choices and visits with your health care provider that can promote health and wellness. Preventive care visits are also called wellness exams. What can I expect for my preventive care visit? Counseling Your health care provider may ask you questions about your: Medical history, including: Past medical problems. Family medical history. Pregnancy and menstrual history. History of falls. Current health, including: Memory and ability to understand (cognition). Emotional well-being. Home life and relationship well-being. Sexual activity and sexual health. Lifestyle, including: Alcohol, nicotine or tobacco, and drug use. Access to firearms. Diet, exercise, and sleep habits. Work and work Statistician. Sunscreen use. Safety issues such as seatbelt and bike helmet use. Physical exam Your health care provider will check your: Height and weight. These may be used to calculate your BMI (body mass index). BMI is a measurement that tells if you are at a healthy weight. Waist circumference. This measures the distance around your waistline. This measurement also tells if you are at a healthy weight and may help predict your risk of certain diseases, such as type 2 diabetes and high blood pressure. Heart rate and blood pressure. Body temperature. Skin for abnormal spots. What immunizations do I need?  Vaccines are usually given at various ages, according to a schedule. Your health care provider will recommend vaccines for you based on your age, medical history, and lifestyle or other factors, such as travel or where you work. What tests do I need? Screening Your health care provider may recommend screening tests for certain conditions. This may include: Lipid and cholesterol  levels. Hepatitis C test. Hepatitis B test. HIV (human immunodeficiency virus) test. STI (sexually transmitted infection) testing, if you are at risk. Lung cancer screening. Colorectal cancer screening. Diabetes screening. This is done by checking your blood sugar (glucose) after you have not eaten for a while (fasting). Mammogram. Talk with your health care provider about how often you should have regular mammograms. BRCA-related cancer screening. This may be done if you have a family history of breast, ovarian, tubal, or peritoneal cancers. Bone density scan. This is done to screen for osteoporosis. Talk with your health care provider about your test results, treatment options, and if necessary, the need for more tests. Follow these instructions at home: Eating and drinking  Eat a diet that includes fresh fruits and vegetables, whole grains, lean protein, and low-fat dairy products. Limit your intake of foods with high amounts of sugar, saturated fats, and salt. Take vitamin and mineral supplements as recommended by your health care provider. Do not drink alcohol if your health care provider tells you not to drink. If you drink alcohol: Limit how much you have to 0-1 drink a day. Know how much alcohol is in your drink. In the [place] one drink equals one 12 oz bottle of beer (355 mL), one 5 oz glass of wine (148 mL), or one 1 oz glass of hard liquor (44 mL). Lifestyle Brush your teeth every morning and night with fluoride toothpaste. Floss one time each day. Exercise for at least 30 minutes 5 or more days each week. Do not use any products that contain nicotine or tobacco. These products include cigarettes, chewing tobacco, and vaping devices, such as e-cigarettes. If you need help quitting, ask your health care provider. Do not use  drugs. If you are sexually active, practice safe sex. Use a condom or other form of protection in order to prevent STIs. Take aspirin only as told by your  health care provider. Make sure that you understand how much to take and what form to take. Work with your health care provider to find out whether it is safe and beneficial for you to take aspirin daily. Ask your health care provider if you need to take a cholesterol-lowering medicine (statin). Find healthy ways to manage stress, such as: Meditation, yoga, or listening to music. Journaling. Talking to a trusted person. Spending time with friends and family. Minimize exposure to UV radiation to reduce your risk of skin cancer. Safety Always wear your seat belt while driving or riding in a vehicle. Do not drive: If you have been drinking alcohol. Do not ride with someone who has been drinking. When you are tired or distracted. While texting. If you have been using any mind-altering substances or drugs. Wear a helmet and other protective equipment during sports activities. If you have firearms in your house, make sure you follow all gun safety procedures. What's next? Visit your health care provider once a year for an annual wellness visit. Ask your health care provider how often you should have your eyes and teeth checked. Stay up to date on all vaccines. This information is not intended to replace advice given to you by your health care provider. Make sure you discuss any questions you have with your health care provider. Document Revised: 10/26/2020 Document Reviewed: 10/26/2020 Elsevier Patient Education  2023 Elsevier Inc.  

## 2021-12-12 NOTE — Assessment & Plan Note (Signed)
Controlled.  Continue pantoprazole 40 mg daily. 

## 2021-12-12 NOTE — Assessment & Plan Note (Signed)
Repeat renal function pending.  Overall good water intake.  Limit NSAID use.

## 2021-12-12 NOTE — Assessment & Plan Note (Signed)
Following with pulmonology, reviewed office notes from July 2023. Continue Symbicort BID, albuterol inhaler as needed.

## 2021-12-12 NOTE — Assessment & Plan Note (Signed)
Following with neuropsychiatry.  Continue Adderall 20 mg TID.

## 2021-12-12 NOTE — Assessment & Plan Note (Signed)
Overall controlled.  Continue olmesartan 20 mg daily, amlodipine 5 mg daily. CMP pending.

## 2021-12-12 NOTE — Progress Notes (Signed)
Subjective:    Patient ID: Katie Robbins, female    DOB: 05-21-1955, 66 y.o.   MRN: [id number]  HPI  Katie Robbins is a very pleasant 66 y.o. female who presents today for complete physical and follow up of chronic conditions.  Immunizations: -Tetanus: 2016 -Influenza: Did not receive last season  -Covid-19: 2 vaccines  -Shingles: Completed 1 Shingrix -Pneumonia: Completed in 2016  Cannot receive any additional vaccines due to her GBS.   Diet: Fair diet.  Exercise: No regular exercise.  Eye exam: Completes annually  Dental exam: Completes semi-annually   Mammogram: Completed in October 2022  Colonoscopy: Completed in 2021, due 2028 Dexa: Completed in September 2022  BP Readings from Last 3 Encounters:  12/12/21 138/80  12/06/21 132/84  11/30/21 120/70    Wt Readings from Last 3 Encounters:  12/12/21 138 lb (62.6 kg)  12/06/21 138 lb 3.2 oz (62.7 kg)  11/30/21 138 lb 12.8 oz (63 kg)       Review of Systems  Constitutional:  Negative for unexpected weight change.  HENT:  Negative for rhinorrhea.   Eyes:  Negative for visual disturbance.  Respiratory:  Negative for cough and shortness of breath.   Cardiovascular:  Negative for chest pain.  Gastrointestinal:  Negative for constipation and diarrhea.  Genitourinary:  Negative for difficulty urinating.  Musculoskeletal:  Positive for arthralgias, myalgias and neck pain.  Skin:  Negative for rash.  Allergic/Immunologic: Negative for environmental allergies.  Neurological:  Positive for dizziness, tremors, weakness and numbness. Negative for headaches.  Psychiatric/Behavioral:  Positive for sleep disturbance. The patient is not nervous/anxious.          Past Medical History:  Diagnosis Date   Anxiety    Arthritis    Asthma    Chicken pox    Chronic headaches    Diverticulitis    Essential hypertension    GERD (gastroesophageal reflux disease)    Guillain Barr syndrome ([place]) 1982   H/O breast implant     Heart murmur    Hypertension    Hypothyroidism    Interstitial cystitis    Interstitial cystitis    Mitral prolapse 1985   Nutcracker esophagus     Social History   Socioeconomic History   Marital status: Married    Spouse name: [name]   Number of children: 2   Years of education: Some college   Highest education level: Not on file  Occupational History   Occupation: Retired  Tobacco Use   Smoking status: Never   Smokeless tobacco: Never  Vaping Use   Vaping Use: Never used  Substance and Sexual Activity   Alcohol use: Yes    Comment: socially   Drug use: No   Sexual activity: Yes    Birth control/protection: Surgical  Other Topics Concern   Not on file  Social History Narrative   Lives with husband   Caffeine use: rare   Right handed   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     Social Determinants of Health   Financial Resource Strain: Not on file  Food Insecurity: Not on file  Transportation Needs: Not on file  Physical Activity: Not on file  Stress: Not on file  Social Connections: Not on file  Intimate Partner Violence: Not on file    Past Surgical History:  Procedure Laterality Date   ABDOMINAL HYSTERECTOMY  2000/2009   APPENDECTOMY  2007   [place]  2007   CARDIAC CATHETERIZATION  2016  [place]   CARPAL TUNNEL RELEASE Right    CHOLECYSTECTOMY  2012   CHOLECYSTECTOMY  2012   COLONOSCOPY WITH PROPOFOL N/A 04/13/2020   Procedure: COLONOSCOPY WITH PROPOFOL;  Surgeon: Lin Landsman, MD;  Location: [facility] ENDOSCOPY;  Service: Gastroenterology;  Laterality: N/A;   ESOPHAGOGASTRODUODENOSCOPY (EGD) WITH PROPOFOL N/A 04/13/2020   Procedure: ESOPHAGOGASTRODUODENOSCOPY (EGD) WITH PROPOFOL;  Surgeon: Lin Landsman, MD;  Location: [facility] ENDOSCOPY;  Service: Gastroenterology;  Laterality: N/A;   NECK SURGERY  06/11/2016   plates and screws in neck    SMALL INTESTINE SURGERY  2007   Bowel resection   TONSILLECTOMY     VESICO-VAGINAL  FISTULA REPAIR  2007    Family History  Problem Relation Age of Onset   Hypertension Mother    GER disease Mother    Polymyalgia rheumatica Mother    COPD Mother    Osteoarthritis Mother    Osteoporosis Mother    Other Mother        Giant Cell Arteritis    GER disease Father    Heart attack Father     Allergies  Allergen Reactions   Bactrim [Sulfamethoxazole-Trimethoprim] Hives   Macrobid [Nitrofurantoin Macrocrystal] Hives   Tizanidine Other (See Comments)    Bad Mood changs   Vicodin [Hydrocodone-Acetaminophen] Swelling    Puffy eyes and face   Trileptal [Oxcarbazepine] Rash   Acetaminophen     Other reaction(s): Puffy face/eyes   Hydrocodone    Nexium [Esomeprazole Magnesium]     Face swelling, hives    Prilosec [Omeprazole]     Face swelling, hives    Sulfa Antibiotics    Tamoxifen    Trimethoprim     Other reaction(s): Hives   Wasp Venom Hives    swelling   Erythromycin Rash   Lamotrigine Rash   Molds & Smuts Anxiety    Current Outpatient Medications on File Prior to Visit  Medication Sig Dispense Refill   albuterol (VENTOLIN HFA) 108 (90 Base) MCG/ACT inhaler INHALE 2 PUFFS INTO THE LUNGS EVERY 6 HOURS AS NEEDED FOR WHEEZING OR SHORTNESS OF BREATH. 8.5 g 0   amLODipine (NORVASC) 5 MG tablet TAKE 1 TABLET BY MOUTH ONCE DAILY FOR BLOOD PRESSURE 90 tablet 0   amphetamine-dextroamphetamine (ADDERALL) 20 MG tablet Take 20 mg by mouth in the morning, at noon, and at bedtime.     aspirin 81 MG EC tablet Take 81 mg by mouth daily.     Budesonide-Formoterol Fumarate (SYMBICORT IN)      cyanocobalamin (,VITAMIN B-12,) 1000 MCG/ML injection Inject 1 vial into the muscle every 3-4 weeks. 12 mL 1   diazepam (VALIUM) 5 MG tablet Take 10 mg by mouth in the morning and at bedtime.     EPINEPHrine 0.3 mg/0.3 mL IJ SOAJ injection Inject 0.3 mLs (0.3 mg total) into the muscle as needed for anaphylaxis. 1 each 0   levothyroxine (SYNTHROID) 100 MCG tablet Take 1 tablet (100 mcg  total) by mouth as directed. 1 tablet Monday through Friday 60 tablet 1   levothyroxine (SYNTHROID) 112 MCG tablet Take 1 tablet (112 mcg total) by mouth as directed. 1 tablet on Saturday and Sunday 24 tablet 1   olmesartan (BENICAR) 20 MG tablet TAKE 1 TABLET BY MOUTH TWICE A DAY FOR BLOOD PRESSURE 180 tablet 0   pantoprazole (PROTONIX) 40 MG tablet Take 1 tablet (40 mg total) by mouth daily. 90 tablet 3   estradiol (ESTRACE) 1 MG tablet Take 1 tablet by mouth daily.  No current facility-administered medications on file prior to visit.    BP 138/80   Pulse 95   Temp 97.9 F (36.6 C) (Oral)   Ht '5\' 2"'$  (1.575 m)   Wt 138 lb (62.6 kg)   SpO2 97%   BMI 25.24 kg/m  Objective:   Physical Exam HENT:     Right Ear: Tympanic membrane and ear canal normal.     Left Ear: Tympanic membrane and ear canal normal.     Nose: Nose normal.  Eyes:     Conjunctiva/sclera: Conjunctivae normal.     Pupils: Pupils are equal, round, and reactive to light.  Neck:     Thyroid: No thyromegaly.  Cardiovascular:     Rate and Rhythm: Normal rate and regular rhythm.     Heart sounds: No murmur heard. Pulmonary:     Effort: Pulmonary effort is normal.     Breath sounds: Normal breath sounds. No rales.  Abdominal:     General: Bowel sounds are normal.     Palpations: Abdomen is soft.     Tenderness: There is no abdominal tenderness.  Musculoskeletal:        General: Normal range of motion.     Cervical back: Neck supple.  Lymphadenopathy:     Cervical: No cervical adenopathy.  Skin:    General: Skin is warm and dry.     Findings: No rash.  Neurological:     Mental Status: She is alert and oriented to person, place, and time.     Cranial Nerves: No cranial nerve deficit.     Deep Tendon Reflexes: Reflexes are normal and symmetric.           Assessment & Plan:   Problem List Items Addressed This Visit       Cardiovascular and Mediastinum   Essential hypertension    Overall  controlled.  Continue olmesartan 20 mg daily, amlodipine 5 mg daily. CMP pending.       Relevant Orders   Basic metabolic panel   Lipid panel     Respiratory   Asthma    Following with pulmonology, reviewed office notes from July 2023. Continue Symbicort BID, albuterol inhaler as needed.        Relevant Medications   Budesonide-Formoterol Fumarate (SYMBICORT IN)     Digestive   Esophageal reflux    Controlled.  Continue pantoprazole 40 mg daily.        Endocrine   Hypothyroidism    Reviewed TSH and endocrinology notes from July 2023. Continue levothyroxine 112 mcg twice weekly 100 mcg five days weekly.        Nervous and Auditory   Cervical disc disorder with radiculopathy of cervical region    Following with neurosurgery and pain management. Recently s/p ablation.   Continues to notice improvement.       Mild neurocognitive disorder due to traumatic brain injury [facility])    Following with neuropsychiatry and therapy.  Continue Adderall 20 mg TID.         Genitourinary   Interstitial cystitis    Following with Urology.  No concerns today.      CKD (chronic kidney disease) stage 3, GFR 30-59 ml/min (HCC)    Repeat renal function pending.  Overall good water intake.  Limit NSAID use.        Other   Estrogen deficiency    Following with GYN.  Continue estradiol 1 mg daily.      Preventative health care - Primary  Immunizations UTD. She can no longer receive vaccines due to her GBS.  Mammogram UTD. Colonoscopy UTD, due 2028. Bone density scan UTD.  Discussed the importance of a healthy diet and regular exercise in order for weight loss, and to reduce the risk of further co-morbidity.  Exam stable. Labs pending.  Follow up in 1 year for repeat physical.       Chronic fatigue    Ongoing.  Patient to follow up with neurology in September 2023 regarding evaluation for potential GBS flares.       Prediabetes    Overall stable over  the years. Repeat A1C pending.       Relevant Orders   Hemoglobin A1c   Numbness and tingling    Following with neurology. Pulmonology suspects GBS is causing symptoms.  She will be seeing Neurology in September 2023.      Mood disorder as late effect of traumatic brain injury [facility])    Following with neuropsychiatry.  Continue Adderall 20 mg TID.      Vitamin B 12 deficiency    Continue vitamin B12 injections every 3 weeks.  Repeat B12 levels pending.       Relevant Orders   Vitamin B12   Neck pain    Following with neurosurgery. She is recently s/p ablation with improvement.       Urinary incontinence    No concerns today. Follows with Urology.       Lower extremity weakness    Chronic. Following with neurology.   Myasthenia Gravis testing negative from Fall 2022. Pulmonology suspects GBS to cause symptoms. She will discuss this with neurology at her upcoming visit.           Pleas Koch, NP

## 2021-12-12 NOTE — Assessment & Plan Note (Signed)
Reviewed TSH and endocrinology notes from July 2023. Continue levothyroxine 112 mcg twice weekly 100 mcg five days weekly.

## 2021-12-12 NOTE — Assessment & Plan Note (Signed)
Following with neurosurgery. She is recently s/p ablation with improvement.

## 2021-12-12 NOTE — Assessment & Plan Note (Signed)
Following with neurosurgery and pain management. Recently s/p ablation.   Continues to notice improvement.

## 2021-12-12 NOTE — Assessment & Plan Note (Signed)
No concerns today. Follows with Urology.

## 2021-12-12 NOTE — Assessment & Plan Note (Addendum)
Ongoing.  Patient to follow up with neurology in September 2023 regarding evaluation for potential GBS flares.

## 2021-12-12 NOTE — Assessment & Plan Note (Signed)
Following with Urology.  No concerns today.

## 2021-12-13 ENCOUNTER — Other Ambulatory Visit: Payer: Self-pay | Admitting: Primary Care

## 2021-12-13 DIAGNOSIS — N183 Chronic kidney disease, stage 3 unspecified: Secondary | ICD-10-CM

## 2021-12-13 LAB — BASIC METABOLIC PANEL
BUN: 25 mg/dL — ABNORMAL HIGH (ref 6–23)
CO2: 26 mEq/L (ref 19–32)
Calcium: 9.4 mg/dL (ref 8.4–10.5)
Chloride: 99 mEq/L (ref 96–112)
Creatinine, Ser: 1.27 mg/dL — ABNORMAL HIGH (ref 0.40–1.20)
GFR: 44.16 mL/min — ABNORMAL LOW (ref >60.00)
Glucose, Bld: 77 mg/dL (ref 70–99)
Potassium: 5 mEq/L (ref 3.5–5.1)
Sodium: 134 mEq/L — ABNORMAL LOW (ref 135–145)

## 2021-12-13 LAB — VITAMIN B12: Vitamin B-12: 765 pg/mL (ref 211–911)

## 2021-12-13 LAB — LIPID PANEL
Cholesterol: 197 mg/dL (ref 0–200)
HDL: 76 mg/dL (ref >39.00)
LDL Cholesterol: 94 mg/dL (ref 0–99)
NonHDL: 121.32
Total CHOL/HDL Ratio: 3
Triglycerides: 136 mg/dL (ref 0.0–149.0)
VLDL: 27.2 mg/dL (ref 0.0–40.0)

## 2021-12-13 LAB — HEMOGLOBIN A1C: Hgb A1c MFr Bld: 6.1 % (ref 4.6–6.5)

## 2021-12-20 NOTE — Telephone Encounter (Signed)
Katie Robbins, can we fax her recent BMP results to her nephrologist?

## 2021-12-28 ENCOUNTER — Telehealth: Payer: Self-pay

## 2021-12-28 DIAGNOSIS — R197 Diarrhea, unspecified: Secondary | ICD-10-CM

## 2021-12-28 NOTE — Telephone Encounter (Signed)
Patient states Monday night she had home made chicken and dunlins. She had diarrhea all night long. She states she went so much that she she was just having liquid and mucous. She states on Tuesday morning she felt like she needs to go to the bathroom but could not. Tuesday she ate [employer] mix with special K and a Ham cheese croissant. She states on Wednesday she ate Brand flakes mix with special K, a Peach, and 4 chicken nuggets. She is states last night starting around 8pm she began to have diarrhea again. She states she went every other hour and this morning and it is is water and Mucous. She states she does not have a gallbladder and she can hear her pancrease shooting liquid through her stomach. She states she is having Nausea, Gas, burping abdominal bloating, and lower abdominal pain. She states her medications usually make her constipated but for the last month she has been going regular and increasing her fiber intake. She has not taken anything over the counter because they usually will make her constipated

## 2021-12-28 NOTE — Telephone Encounter (Signed)
Called and patient will come and pick up the stool kit

## 2021-12-29 ENCOUNTER — Ambulatory Visit: Payer: 59 | Admitting: Internal Medicine

## 2021-12-31 LAB — GI PROFILE, STOOL, PCR

## 2022-01-01 ENCOUNTER — Telehealth: Payer: Self-pay

## 2022-01-01 MED ORDER — DIFICID 200 MG PO TABS: 200.0000 mg | ORAL_TABLET | Freq: Two times a day (BID) | ORAL | 0 refills | Status: AC

## 2022-01-01 NOTE — Telephone Encounter (Signed)
Sent Dificid to the pharmacy. Informed patient

## 2022-01-01 NOTE — Telephone Encounter (Signed)
Patient states she is still having the diarrhea. She states when she had it several years ago she took vancomycin and it cleared the infection up

## 2022-01-01 NOTE — Telephone Encounter (Signed)
Can you please review Dr. Marius Ditch GI profile results for this patient and advise on recommendations.

## 2022-01-02 ENCOUNTER — Ambulatory Visit (INDEPENDENT_AMBULATORY_CARE_PROVIDER_SITE_OTHER): Payer: 59 | Admitting: Obstetrics and Gynecology

## 2022-01-02 ENCOUNTER — Encounter: Payer: Self-pay | Admitting: Obstetrics and Gynecology

## 2022-01-02 ENCOUNTER — Other Ambulatory Visit: Payer: Self-pay

## 2022-01-02 VITALS — BP 123/78 | HR 83 | Ht 62.0 in | Wt 138.0 lb

## 2022-01-02 DIAGNOSIS — Z1231 Encounter for screening mammogram for malignant neoplasm of breast: Secondary | ICD-10-CM

## 2022-01-02 DIAGNOSIS — Z01419 Encounter for gynecological examination (general) (routine) without abnormal findings: Secondary | ICD-10-CM

## 2022-01-02 MED ORDER — ESTRADIOL 1 MG PO TABS: 1.0000 mg | ORAL_TABLET | Freq: Every day | ORAL | 11 refills | Status: DC

## 2022-01-02 NOTE — Progress Notes (Signed)
HPI:      Katie Robbins is a 66 y.o. [phone] who LMP was No LMP recorded. Patient has had a hysterectomy.  Subjective:   She presents today for her annual examination.  She reports no problems.  She does state that she has to spend some time and get in the correct position to begin her urine stream but other than that she reports no further issues.  She is taking HRT. Of significant note she has had a previous hysterectomy.  She has a history of IC and IBS.  She previously had a colovaginal fistula which was repaired with mesh between the bladder and the vagina.  In addition she has had prior abdominal surgery where she had "so many adhesions they had difficulty doing the surgery".    Hx: The following portions of the patient's history were reviewed and updated as appropriate:             She  has a past medical history of Anxiety, Arthritis, Asthma, Chicken pox, Chronic headaches, Diverticulitis, Essential hypertension, GERD (gastroesophageal reflux disease), Guillain Barr syndrome ([place]) (1982), H/O breast implant, Heart murmur, Hypertension, Hypothyroidism, Interstitial cystitis, Interstitial cystitis, Mitral prolapse (1985), Nutcracker esophagus, and Osteoarthritis. She does not have any pertinent problems on file. She  has a past surgical history that includes Cholecystectomy (2012); Appendectomy (2007); Abdominal hysterectomy (2000/2009); Tonsillectomy; Small intestine surgery (2007); Cholecystectomy (2012); Cardiac catheterization (2016); Vesico-vaginal fistula repair (2007); Neck surgery (06/11/2016); Carpal tunnel release (Right); Bowel resection (2007); Colonoscopy with propofol (N/A, 04/13/2020); Esophagogastroduodenoscopy (egd) with propofol (N/A, 04/13/2020); and Augmentation mammaplasty. Her family history includes COPD in her mother; GER disease in her father and mother; Heart attack in her father; Hypertension in her mother; Osteoarthritis in her mother; Osteoporosis in her mother;  Other in her mother; Polymyalgia rheumatica in her mother. She  reports that she has never smoked. She has never used smokeless tobacco. She reports current alcohol use. She reports that she does not use drugs. She has a current medication list which includes the following prescription(s): albuterol, amlodipine, amphetamine-dextroamphetamine, aspirin ec, budesonide-formoterol fumarate, cyanocobalamin, diazepam, epinephrine, dificid, levothyroxine, levothyroxine, olmesartan, pantoprazole, and estradiol. She is allergic to bactrim [sulfamethoxazole-trimethoprim], macrobid [nitrofurantoin macrocrystal], tizanidine, vicodin [hydrocodone-acetaminophen], trileptal [oxcarbazepine], acetaminophen, hydrocodone, nexium [esomeprazole magnesium], nortriptyline, prilosec [omeprazole], sulfa antibiotics, tamoxifen, trimethoprim, wasp venom, erythromycin, lamotrigine, and molds & smuts.       Review of Systems:  Review of Systems  Constitutional: Denied constitutional symptoms, night sweats, recent illness, fatigue, fever, insomnia and weight loss.  Eyes: Denied eye symptoms, eye pain, photophobia, vision change and visual disturbance.  Ears/Nose/Throat/Neck: Denied ear, nose, throat or neck symptoms, hearing loss, nasal discharge, sinus congestion and sore throat.  Cardiovascular: Denied cardiovascular symptoms, arrhythmia, chest pain/pressure, edema, exercise intolerance, orthopnea and palpitations.  Respiratory: Denied pulmonary symptoms, asthma, pleuritic pain, productive sputum, cough, dyspnea and wheezing.  Gastrointestinal: Denied, gastro-esophageal reflux, melena, nausea and vomiting.  Genitourinary: Denied genitourinary symptoms including symptomatic vaginal discharge, pelvic relaxation issues, and urinary complaints.  Musculoskeletal: Denied musculoskeletal symptoms, stiffness, swelling, muscle weakness and myalgia.  Dermatologic: Denied dermatology symptoms, rash and scar.  Neurologic: Denied neurology  symptoms, dizziness, headache, neck pain and syncope.  Psychiatric: Denied psychiatric symptoms, anxiety and depression.  Endocrine: Denied endocrine symptoms including hot flashes and night sweats.   Meds:   Current Outpatient Medications on File Prior to Visit  Medication Sig Dispense Refill   albuterol (VENTOLIN HFA) 108 (90 Base) MCG/ACT inhaler INHALE 2 PUFFS INTO THE LUNGS EVERY 6 HOURS  AS NEEDED FOR WHEEZING OR SHORTNESS OF BREATH. 8.5 g 0   amLODipine (NORVASC) 5 MG tablet TAKE 1 TABLET BY MOUTH ONCE DAILY FOR BLOOD PRESSURE 90 tablet 0   amphetamine-dextroamphetamine (ADDERALL) 20 MG tablet Take 20 mg by mouth in the morning, at noon, and at bedtime.     aspirin 81 MG EC tablet Take 81 mg by mouth daily.     Budesonide-Formoterol Fumarate (SYMBICORT IN)      cyanocobalamin (,VITAMIN B-12,) 1000 MCG/ML injection Inject 1 vial into the muscle every 3-4 weeks. 12 mL 1   diazepam (VALIUM) 5 MG tablet Take 10 mg by mouth in the morning and at bedtime.     EPINEPHrine 0.3 mg/0.3 mL IJ SOAJ injection Inject 0.3 mLs (0.3 mg total) into the muscle as needed for anaphylaxis. 1 each 0   fidaxomicin (DIFICID) 200 MG TABS tablet Take 1 tablet (200 mg total) by mouth 2 (two) times daily for 10 days. 20 tablet 0   levothyroxine (SYNTHROID) 100 MCG tablet Take 1 tablet (100 mcg total) by mouth as directed. 1 tablet Monday through Friday 60 tablet 1   levothyroxine (SYNTHROID) 112 MCG tablet Take 1 tablet (112 mcg total) by mouth as directed. 1 tablet on Saturday and Sunday 24 tablet 1   olmesartan (BENICAR) 20 MG tablet TAKE 1 TABLET BY MOUTH TWICE A DAY FOR BLOOD PRESSURE 180 tablet 0   pantoprazole (PROTONIX) 40 MG tablet Take 1 tablet (40 mg total) by mouth daily. 90 tablet 3   No current facility-administered medications on file prior to visit.     Objective:     Vitals:   01/02/22 1142  BP: 123/78  Pulse: 83    Filed Weights   01/02/22 1142  Weight: 138 lb (62.6 kg)               Physical examination General NAD, Conversant  HEENT Atraumatic; Op clear with mmm.  Normo-cephalic. Pupils reactive. Anicteric sclerae  Thyroid/Neck Smooth without nodularity or enlargement. Normal ROM.  Neck Supple.  Skin No rashes, lesions or ulceration. Normal palpated skin turgor. No nodularity.  Breasts: No masses or discharge.  Symmetric.  No axillary adenopathy.  Lungs: Clear to auscultation.No rales or wheezes. Normal Respiratory effort, no retractions.  Heart: NSR.  No murmurs or rubs appreciated. No periferal edema  Abdomen: Soft.  Non-tender.  No masses.  No HSM. No hernia  Extremities: Moves all appropriately.  Normal ROM for age. No lymphadenopathy.  Neuro: Oriented to PPT.  Normal mood. Normal affect.     Pelvic:   Vulva: Normal appearance.  No lesions.   Vagina: No lesions or abnormalities noted.  Support: Second-degree cystocele  Urethra No masses tenderness or scarring.  Meatus Normal size without lesions or prolapse.  Cervix: Surgically absent   Anus: Normal exam.  No lesions.  Perineum: Normal exam.  No lesions.        Bimanual   Uterus: Surgically absent   Adnexae: No masses.  Non-tender to palpation.  Cul-de-sac: Negative for abnormality.     Assessment:    [id number] Patient Active Problem List   Diagnosis Date Noted   Lower extremity weakness 12/12/2021   Plantar fasciitis, left 01/31/2021   Esophageal spasm 12/09/2020   History of depression 08/16/2020   Sleep disorder, unspecified 08/16/2020   Neck pain 07/28/2020   Non-cardiac chest pain 06/13/2020   Esophageal dysphagia    Soft tissue mass 03/31/2020   Body mass index (BMI) 27.0-27.9, adult 10/22/2019  CKD (chronic kidney disease) stage 3, GFR 30-59 ml/min (HCC) 06/30/2019   Dysuria 03/16/2019   Vitamin B 12 deficiency 01/27/2019   Numbness of hand 01/13/2019   Panic disorder 12/29/2018   Mood disorder as late effect of traumatic brain injury ([place]) 08/07/2018   Mild neurocognitive disorder due  to traumatic brain injury ([place]) 08/07/2018   Acute thoracic back pain 07/22/2018   Neuropathy 07/22/2018   Prediabetes 07/08/2018   Numbness and tingling 07/08/2018   Environmental and seasonal allergies 04/24/2018   Dyspnea 02/06/2018   Chronic fatigue 01/23/2018   Ataxia 07/16/2017   Urinary incontinence 07/16/2017   Primary osteoarthritis of left knee 05/29/2017   S/P carpal tunnel release 05/29/2017   Primary osteoarthritis of both hands 05/29/2017   History of bilateral breast implants 05/29/2017   Interstitial cystitis 05/29/2017   History of mitral valve prolapse 05/29/2017   History of diverticulitis 05/29/2017   Carpal tunnel syndrome 05/16/2016   Stenosis of intervertebral foramina 05/16/2016   Degeneration of intervertebral disc of cervical spine without prolapsed disc 05/01/2016   Cervical disc disorder with radiculopathy of cervical region 04/12/2016   Preventative health care 11/28/2015   Esophageal reflux 08/19/2015   Essential hypertension 08/19/2015   Hypothyroidism 08/19/2015   Asthma 08/19/2015   Chronic headaches 08/19/2015   Estrogen deficiency 08/19/2015   Nutcracker esophagus 08/12/2005     1. Well woman exam with routine gynecological exam   2. Screening mammogram for breast cancer        Plan:            1.  Basic Screening Recommendations The basic screening recommendations for asymptomatic women were discussed with the patient during her visit.  The age-appropriate recommendations were discussed with her and the rational for the tests reviewed.  When I am informed by the patient that another primary care physician has previously obtained the age-appropriate tests and they are up-to-date, only outstanding tests are ordered and referrals given as necessary.  Abnormal results of tests will be discussed with her when all of her results are completed.  Routine preventative health maintenance measures emphasized: Exercise/Diet/Weight control, Tobacco  Warnings, Alcohol/Substance use risks and Stress Management  2.  Continue ERT  3.  Mammogram ordered Orders Orders Placed This Encounter  Procedures   MM DIGITAL SCREENING BILATERAL    No orders of the defined types were placed in this encounter.         F/U  No follow-ups on file.  Finis Bud, M.D. 01/02/2022 12:21 PM

## 2022-01-02 NOTE — Progress Notes (Signed)
Patients presents for annual exam today. She states doing well and continuing to take Estrace daily.History of hysterectomy. Due for mammogram, ordered. Patients annual labs are ordered.  Patient states no other questions or concerns at this time.

## 2022-01-18 NOTE — Telephone Encounter (Signed)
FYI  I have sent message recommending that she call her GI doc and urgent to call office for visit with someone in our office.

## 2022-02-01 ENCOUNTER — Ambulatory Visit: Payer: 59 | Admitting: Emergency Medicine

## 2022-02-01 ENCOUNTER — Ambulatory Visit (INDEPENDENT_AMBULATORY_CARE_PROVIDER_SITE_OTHER): Payer: 59 | Admitting: Emergency Medicine

## 2022-02-01 ENCOUNTER — Encounter: Payer: Self-pay | Admitting: Emergency Medicine

## 2022-02-01 DIAGNOSIS — J4540 Moderate persistent asthma, uncomplicated: Secondary | ICD-10-CM | POA: Diagnosis not present

## 2022-02-01 DIAGNOSIS — R0602 Shortness of breath: Secondary | ICD-10-CM | POA: Diagnosis not present

## 2022-02-01 LAB — PULMONARY FUNCTION TEST
DL/VA % pred: 86 %
DL/VA: 3.65 ml/min/mmHg/L
DLCO cor % pred: 80 %
DLCO cor: 14.81 ml/min/mmHg
DLCO unc % pred: 80 %
DLCO unc: 14.81 ml/min/mmHg
FEF 25-75 Post: 2.41 L/sec
FEF 25-75 Pre: 1.51 L/sec
FEF2575-%Change-Post: 59 %
FEF2575-%Pred-Post: 122 %
FEF2575-%Pred-Pre: 76 %
FEV1-%Change-Post: 7 %
FEV1-%Pred-Post: 97 %
FEV1-%Pred-Pre: 91 %
FEV1-Post: 2.15 L
FEV1-Pre: 2.01 L
FEV1FVC-%Change-Post: 0 %
FEV1FVC-%Pred-Pre: 100 %
FEV6-%Change-Post: 6 %
FEV6-%Pred-Post: 100 %
FEV6-%Pred-Pre: 94 %
FEV6-Post: 2.78 L
FEV6-Pre: 2.61 L
FEV6FVC-%Pred-Post: 104 %
FEV6FVC-%Pred-Pre: 104 %
FVC-%Change-Post: 6 %
FVC-%Pred-Post: 96 %
FVC-%Pred-Pre: 90 %
FVC-Post: 2.78 L
FVC-Pre: 2.61 L
Post FEV1/FVC ratio: 77 %
Post FEV6/FVC ratio: 100 %
Pre FEV1/FVC ratio: 77 %
Pre FEV6/FVC Ratio: 100 %
RV % pred: 106 %
RV: 2.13 L
TLC % pred: 93 %
TLC: 4.43 L

## 2022-02-01 NOTE — Patient Instructions (Signed)
Full PFT performed today. °

## 2022-02-01 NOTE — Patient Instructions (Signed)
We reviewed your pulmonary function testing today.  These are reassuring, stable compared with March 2022.  Good news. We will continue Symbicort 2 puffs twice a day.  Rinse and gargle after using. Keep your albuterol available to use 2 puffs if needed for shortness of breath, chest tightness, wheezing. Follow Dr. Lamonte Sakai in 1 year or sooner if you have any problems.

## 2022-02-01 NOTE — Progress Notes (Signed)
Full PFT performed today. °

## 2022-02-01 NOTE — Assessment & Plan Note (Signed)
Pulmonary function testing reviewed today.  There is no evidence for restrictive disease, normal lung volumes.  No evidence for respiratory muscle weakness.  She notes that she feels like she has flaring symptoms of GBS but her description is inconsistent.  PFTs are reassuring.  I do not think respiratory muscle weakness is contributing to her dyspnea.

## 2022-02-01 NOTE — Assessment & Plan Note (Signed)
She carries a history of asthma and has clinically benefited from Symbicort but her PFTs do not support this.  No evidence for obstructive disease.  That said given the clinical benefit we will continue the Symbicort as ordered.  She has albuterol available to use if needed.

## 2022-02-01 NOTE — Progress Notes (Signed)
Subjective:    Patient ID: Katie Robbins, female    DOB: 01-29-1956, 66 y.o.   MRN: [id number]  HPI  ROV 11/30/21 --follow-up visit 66 year old woman whom I have seen for mild intermittent asthma, documented obstruction on pulmonary function testing.  She also has a history of GERD with nutcracker esophagus associated chest discomfort, Guillain-Barr syndrome, hypertension, MVP, anxiety, chronic headaches.  She has been on Symbicort in the past but we had stopped it successfully.  I then talked to her about 2 weeks ago when she was having progressive dyspnea, began in early May, some associated wheeze and increased coughing.  We decided to go back on the Symbicort and to then follow-up in office.  Today she reports that she is probably benefiting from the Symbicort - lungs feels more open, less albuterol use. Her cough has improved also.    ROV 02/01/2022 --66 year old woman who follows up today for shortness of breath in the setting of mild intermittent asthma, Guillain-Barr syndrome and suspected component of restrictive disease.  She also has a history of GERD, hypertension, MVP, anxiety, chronic headaches.  She has benefited from Symbicort although her PFT have been reassuring in the past.  We decided to repeat these today. She notes that she is doing well on the symbicort. She has some dyspnea when supine, often related to esophageal spasms. She uses a GI cocktail for esophageal spasms. She uses albuterol when she exerts, a few times a day.   Pulmonary function testing performed today and reviewed by me show normal airflows without a bronchodilator response.  Normal lung volumes, normal diffusion capacity and a normal flow volume loop.   Past Medical History:  Diagnosis Date   Anxiety    Arthritis    Asthma    Chicken pox    Chronic headaches    Diverticulitis    Essential hypertension    GERD (gastroesophageal reflux disease)    Guillain Barr syndrome ([place]) 1982   H/O breast implant     Heart murmur    Hypertension    Hypothyroidism    Interstitial cystitis    Interstitial cystitis    Mitral prolapse 1985   Nutcracker esophagus    Osteoarthritis      Review of Systems  Constitutional:  Negative for fever and unexpected weight change.  HENT:  Negative for congestion, dental problem, ear pain, nosebleeds, postnasal drip, rhinorrhea, sinus pressure, sneezing, sore throat and trouble swallowing.   Eyes:  Negative for redness and itching.  Respiratory:  Positive for cough, chest tightness and shortness of breath. Negative for wheezing.   Cardiovascular:  Negative for palpitations and leg swelling.  Gastrointestinal:  Negative for nausea and vomiting.  Genitourinary:  Negative for dysuria.  Musculoskeletal:  Negative for joint swelling.  Skin:  Negative for rash.  Neurological:  Negative for headaches.  Hematological:  Does not bruise/bleed easily.  Psychiatric/Behavioral:  Negative for dysphoric mood. The patient is not nervous/anxious.         Objective:   Physical Exam Vitals:   02/01/22 1609  BP: 132/76  Pulse: 88  Temp: 98.3 F (36.8 C)  TempSrc: Oral  SpO2: 100%  Weight: 136 lb (61.7 kg)  Height: '5\' 2"'$  (1.575 m)   Gen: Pleasant, well-nourished, in no distress, scattered affect  ENT: No lesions,  mouth clear,  oropharynx clear, no postnasal drip  Neck: No JVD, no stridor  Lungs: No use of accessory muscles, no wheeze or crackles  Cardiovascular: RRR, heart sounds normal, no murmur  or gallops, no peripheral edema  Musculoskeletal: Sternal pain was not assessed today  Neuro: alert, tangential, Some cognitive impairment  Skin: Warm, no lesions or rash      Assessment & Plan:  Dyspnea Pulmonary function testing reviewed today.  There is no evidence for restrictive disease, normal lung volumes.  No evidence for respiratory muscle weakness.  She notes that she feels like she has flaring symptoms of GBS but her description is inconsistent.   PFTs are reassuring.  I do not think respiratory muscle weakness is contributing to her dyspnea.  Asthma She carries a history of asthma and has clinically benefited from Symbicort but her PFTs do not support this.  No evidence for obstructive disease.  That said given the clinical benefit we will continue the Symbicort as ordered.  She has albuterol available to use if needed.  Baltazar Apo, MD, PhD 02/01/2022, 5:12 PM [facility] [phone] or if no answer [phone]

## 2022-02-05 ENCOUNTER — Telehealth: Payer: Self-pay | Admitting: Emergency Medicine

## 2022-02-06 ENCOUNTER — Other Ambulatory Visit (INDEPENDENT_AMBULATORY_CARE_PROVIDER_SITE_OTHER): Payer: 59

## 2022-02-06 DIAGNOSIS — E039 Hypothyroidism, unspecified: Secondary | ICD-10-CM

## 2022-02-06 LAB — T4, FREE: Free T4: 0.99 ng/dL (ref 0.60–1.60)

## 2022-02-06 LAB — TSH: TSH: 0.47 u[IU]/mL (ref 0.35–5.50)

## 2022-02-06 MED ORDER — BUDESONIDE-FORMOTEROL FUMARATE 160-4.5 MCG/ACT IN AERO: 2.0000 | INHALATION_SPRAY | Freq: Two times a day (BID) | RESPIRATORY_TRACT | 4 refills | Status: DC

## 2022-02-06 NOTE — Telephone Encounter (Signed)
Spoke with pt and advised that Symbicort rx has been sent to pharmacy.

## 2022-02-07 ENCOUNTER — Encounter: Payer: Self-pay | Admitting: Internal Medicine

## 2022-02-08 ENCOUNTER — Ambulatory Visit
Admission: RE | Admit: 2022-02-08 | Discharge: 2022-02-08 | Disposition: A | Discharge status: Home / Self Care | Payer: 59 | Source: Ambulatory Visit | Attending: Obstetrics and Gynecology | Admitting: Obstetrics and Gynecology

## 2022-02-08 DIAGNOSIS — Z01419 Encounter for gynecological examination (general) (routine) without abnormal findings: Secondary | ICD-10-CM | POA: Insufficient documentation

## 2022-02-08 DIAGNOSIS — Z1231 Encounter for screening mammogram for malignant neoplasm of breast: Secondary | ICD-10-CM | POA: Diagnosis not present

## 2022-02-09 ENCOUNTER — Other Ambulatory Visit: Payer: Self-pay | Admitting: Obstetrics and Gynecology

## 2022-02-09 DIAGNOSIS — N6489 Other specified disorders of breast: Secondary | ICD-10-CM

## 2022-02-09 DIAGNOSIS — R928 Other abnormal and inconclusive findings on diagnostic imaging of breast: Secondary | ICD-10-CM

## 2022-02-15 ENCOUNTER — Other Ambulatory Visit: Payer: Self-pay | Admitting: Primary Care

## 2022-02-15 DIAGNOSIS — I10 Essential (primary) hypertension: Secondary | ICD-10-CM

## 2022-02-21 ENCOUNTER — Encounter: Payer: Self-pay | Admitting: Obstetrics and Gynecology

## 2022-03-07 ENCOUNTER — Ambulatory Visit
Admission: RE | Admit: 2022-03-07 | Discharge: 2022-03-07 | Disposition: A | Discharge status: Home / Self Care | Payer: 59 | Source: Ambulatory Visit | Attending: Obstetrics and Gynecology | Admitting: Obstetrics and Gynecology

## 2022-03-07 DIAGNOSIS — R928 Other abnormal and inconclusive findings on diagnostic imaging of breast: Secondary | ICD-10-CM

## 2022-03-07 DIAGNOSIS — N6489 Other specified disorders of breast: Secondary | ICD-10-CM | POA: Diagnosis present

## 2022-03-12 ENCOUNTER — Other Ambulatory Visit: Payer: Self-pay | Admitting: Primary Care

## 2022-03-12 DIAGNOSIS — I10 Essential (primary) hypertension: Secondary | ICD-10-CM

## 2022-03-16 ENCOUNTER — Other Ambulatory Visit (INDEPENDENT_AMBULATORY_CARE_PROVIDER_SITE_OTHER): Payer: 59

## 2022-03-16 DIAGNOSIS — N183 Chronic kidney disease, stage 3 unspecified: Secondary | ICD-10-CM

## 2022-03-16 LAB — BASIC METABOLIC PANEL
BUN: 18 mg/dL (ref 6–23)
CO2: 28 mEq/L (ref 19–32)
Calcium: 9.3 mg/dL (ref 8.4–10.5)
Chloride: 102 mEq/L (ref 96–112)
Creatinine, Ser: 1.17 mg/dL (ref 0.40–1.20)
GFR: 48.64 mL/min — ABNORMAL LOW (ref >60.00)
Glucose, Bld: 74 mg/dL (ref 70–99)
Potassium: 4.9 mEq/L (ref 3.5–5.1)
Sodium: 136 mEq/L (ref 135–145)

## 2022-04-03 DIAGNOSIS — R5382 Chronic fatigue, unspecified: Secondary | ICD-10-CM

## 2022-04-03 DIAGNOSIS — R0609 Other forms of dyspnea: Secondary | ICD-10-CM

## 2022-04-10 ENCOUNTER — Other Ambulatory Visit: Payer: Self-pay | Admitting: Gastroenterology

## 2022-04-10 DIAGNOSIS — K219 Gastro-esophageal reflux disease without esophagitis: Secondary | ICD-10-CM

## 2022-04-13 ENCOUNTER — Emergency Department (HOSPITAL_COMMUNITY): Payer: 59

## 2022-04-13 ENCOUNTER — Ambulatory Visit (INDEPENDENT_AMBULATORY_CARE_PROVIDER_SITE_OTHER): Payer: 59 | Admitting: Psychiatry

## 2022-04-13 ENCOUNTER — Other Ambulatory Visit: Payer: Self-pay

## 2022-04-13 ENCOUNTER — Encounter (HOSPITAL_COMMUNITY): Payer: Self-pay | Admitting: *Deleted

## 2022-04-13 ENCOUNTER — Emergency Department (HOSPITAL_COMMUNITY)
Admission: EM | Admit: 2022-04-13 | Discharge: 2022-04-14 | Disposition: A | Discharge status: Home / Self Care | Payer: 59 | Attending: Emergency Medicine | Admitting: Emergency Medicine

## 2022-04-13 DIAGNOSIS — S51811A Laceration without foreign body of right forearm, initial encounter: Secondary | ICD-10-CM | POA: Insufficient documentation

## 2022-04-13 DIAGNOSIS — F410 Panic disorder [episodic paroxysmal anxiety] without agoraphobia: Secondary | ICD-10-CM

## 2022-04-13 DIAGNOSIS — I10 Essential (primary) hypertension: Secondary | ICD-10-CM | POA: Insufficient documentation

## 2022-04-13 DIAGNOSIS — S60512A Abrasion of left hand, initial encounter: Secondary | ICD-10-CM | POA: Insufficient documentation

## 2022-04-13 DIAGNOSIS — S069XAS Unspecified intracranial injury with loss of consciousness status unknown, sequela: Secondary | ICD-10-CM | POA: Diagnosis not present

## 2022-04-13 DIAGNOSIS — W19XXXA Unspecified fall, initial encounter: Secondary | ICD-10-CM

## 2022-04-13 DIAGNOSIS — F0630 Mood disorder due to known physiological condition, unspecified: Secondary | ICD-10-CM | POA: Diagnosis not present

## 2022-04-13 DIAGNOSIS — Z7982 Long term (current) use of aspirin: Secondary | ICD-10-CM | POA: Diagnosis not present

## 2022-04-13 DIAGNOSIS — W860XXA Exposure to domestic wiring and appliances, initial encounter: Secondary | ICD-10-CM | POA: Insufficient documentation

## 2022-04-13 DIAGNOSIS — S80811A Abrasion, right lower leg, initial encounter: Secondary | ICD-10-CM | POA: Diagnosis not present

## 2022-04-13 DIAGNOSIS — Z79899 Other long term (current) drug therapy: Secondary | ICD-10-CM | POA: Insufficient documentation

## 2022-04-13 DIAGNOSIS — J45909 Unspecified asthma, uncomplicated: Secondary | ICD-10-CM | POA: Insufficient documentation

## 2022-04-13 DIAGNOSIS — T754XXA Electrocution, initial encounter: Secondary | ICD-10-CM | POA: Diagnosis present

## 2022-04-13 LAB — COMPREHENSIVE METABOLIC PANEL
ALT: 12 U/L (ref 0–44)
AST: 17 U/L (ref 15–41)
Albumin: 3.9 g/dL (ref 3.5–5.0)
Alkaline Phosphatase: 59 U/L (ref 38–126)
Anion gap: 7 (ref 5–15)
BUN: 16 mg/dL (ref 8–23)
CO2: 24 mmol/L (ref 22–32)
Calcium: 9.3 mg/dL (ref 8.9–10.3)
Chloride: 105 mmol/L (ref 98–111)
Creatinine, Ser: 1.21 mg/dL — ABNORMAL HIGH (ref 0.44–1.00)
GFR, Estimated: 49 mL/min — ABNORMAL LOW (ref >60)
Glucose, Bld: 95 mg/dL (ref 70–99)
Potassium: 4.3 mmol/L (ref 3.5–5.1)
Sodium: 136 mmol/L (ref 135–145)
Total Bilirubin: 0.2 mg/dL — ABNORMAL LOW (ref 0.3–1.2)
Total Protein: 6.5 g/dL (ref 6.5–8.1)

## 2022-04-13 LAB — CK: Total CK: 57 U/L (ref 38–234)

## 2022-04-13 LAB — CBC WITH DIFFERENTIAL/PLATELET
Abs Immature Granulocytes: 0.01 10*3/uL (ref 0.00–0.07)
Basophils Absolute: 0.1 10*3/uL (ref 0.0–0.1)
Basophils Relative: 1 %
Eosinophils Absolute: 0.1 10*3/uL (ref 0.0–0.5)
Eosinophils Relative: 2 %
HCT: 34.6 % — ABNORMAL LOW (ref 36.0–46.0)
Hemoglobin: 11.8 g/dL — ABNORMAL LOW (ref 12.0–15.0)
Immature Granulocytes: 0 %
Lymphocytes Relative: 33 %
Lymphs Abs: 2.2 10*3/uL (ref 0.7–4.0)
MCH: 30.9 pg (ref 26.0–34.0)
MCHC: 34.1 g/dL (ref 30.0–36.0)
MCV: 90.6 fL (ref 80.0–100.0)
Monocytes Absolute: 0.4 10*3/uL (ref 0.1–1.0)
Monocytes Relative: 6 %
Neutro Abs: 4 10*3/uL (ref 1.7–7.7)
Neutrophils Relative %: 58 %
Platelets: 207 10*3/uL (ref 150–400)
RBC: 3.82 MIL/uL — ABNORMAL LOW (ref 3.87–5.11)
RDW: 13.2 % (ref 11.5–15.5)
WBC: 6.8 10*3/uL (ref 4.0–10.5)
nRBC: 0 % (ref 0.0–0.2)

## 2022-04-13 LAB — TROPONIN I (HIGH SENSITIVITY): Troponin I (High Sensitivity): 4 ng/L (ref <18)

## 2022-04-13 LAB — LACTIC ACID, PLASMA: Lactic Acid, Venous: 0.8 mmol/L (ref 0.5–1.9)

## 2022-04-13 MED ORDER — LIDOCAINE-EPINEPHRINE (PF) 2 %-1:200000 IJ SOLN
10.0000 mL | Freq: Once | INTRAMUSCULAR | Status: AC
  Administered 2022-04-13: 10 mL via INTRADERMAL
  Filled 2022-04-13: qty 20

## 2022-04-13 MED ORDER — PROCHLORPERAZINE EDISYLATE 10 MG/2ML IJ SOLN
10.0000 mg | Freq: Once | INTRAMUSCULAR | Status: AC
  Administered 2022-04-13: 10 mg via INTRAVENOUS
  Filled 2022-04-13: qty 2

## 2022-04-13 MED ORDER — DIPHENHYDRAMINE HCL 50 MG/ML IJ SOLN
25.0000 mg | Freq: Once | INTRAMUSCULAR | Status: AC
  Administered 2022-04-13: 25 mg via INTRAVENOUS
  Filled 2022-04-13: qty 1
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
## 2022-04-13 NOTE — ED Notes (Signed)
The pt was taken to xray before I could draw her blood

## 2022-04-13 NOTE — ED Notes (Signed)
Unable to  draw blood back from her iv  irrigates but will not draw back

## 2022-04-13 NOTE — ED Notes (Signed)
The pt came back from xray and the c-t tech came and go her she is c/o a headache she wants something for pain

## 2022-04-13 NOTE — ED Provider Notes (Signed)
[facility] EMERGENCY DEPARTMENT Provider Note   CSN: [id number] Arrival date & time: 04/13/22  1751     History  Chief Complaint  Patient presents with   shocked    [facility] is a 66 y.o. female.  HPI  The patient is a 66 year old female with past medical history of asthma, anxiety, HTN, hypothyroidism presenting via EMS for evaluation after electrical injury.  The patient states that she was cleaning a light bulb with a wet rag when she was electrocuted.  She states that she was unable to let go and ultimately fell approximately 4 feet.  She is unclear on potential loss of consciousness.  She is reporting a laceration to the right forearm and superficial abrasions to the bilateral hands, left forearm, and right leg.  She currently denies chest pain, shortness of breath, nausea, vomiting, paresthesias.     Home Medications Prior to Admission medications   Medication Sig Start Date End Date Taking? Authorizing Provider  albuterol (VENTOLIN HFA) 108 (90 Base) MCG/ACT inhaler INHALE 2 PUFFS INTO THE LUNGS EVERY 6 HOURS AS NEEDED FOR WHEEZING OR SHORTNESS OF BREATH. 07/14/21   Pleas Koch, NP  amLODipine (NORVASC) 5 MG tablet TAKE 1 TABLET BY MOUTH ONCE DAILY FOR BLOOD PRESSURE 03/12/22   Pleas Koch, NP  amphetamine-dextroamphetamine (ADDERALL) 20 MG tablet Take 20 mg by mouth in the morning, at noon, and at bedtime. 01/17/21   [provider]  aspirin 81 MG EC tablet Take 81 mg by mouth daily.    [provider]  budesonide-formoterol (SYMBICORT) 160-4.5 MCG/ACT inhaler Inhale 2 puffs into the lungs 2 (two) times daily. 02/06/22   Collene Gobble, MD  cyanocobalamin (,VITAMIN B-12,) 1000 MCG/ML injection Inject 1 vial into the muscle every 3-4 weeks. 08/25/21   Pleas Koch, NP  diazepam (VALIUM) 5 MG tablet Take 10 mg by mouth in the morning and at bedtime.    [provider]  EPINEPHrine 0.3 mg/0.3 mL IJ SOAJ injection  Inject 0.3 mLs (0.3 mg total) into the muscle as needed for anaphylaxis. 02/11/19   Pleas Koch, NP  estradiol (ESTRACE) 1 MG tablet Take 1 tablet (1 mg total) by mouth daily. 01/02/22 01/02/23  Harlin Heys, MD  levothyroxine (SYNTHROID) 100 MCG tablet Take 1 tablet (100 mcg total) by mouth as directed. 1 tablet Monday through Friday 12/06/21   Shamleffer, Melanie Crazier, MD  levothyroxine (SYNTHROID) 112 MCG tablet Take 1 tablet (112 mcg total) by mouth as directed. 1 tablet on Saturday and Sunday 12/06/21   Shamleffer, Melanie Crazier, MD  olmesartan (BENICAR) 20 MG tablet TAKE 1 TABLET BY MOUTH TWICE A DAY FOR BLOOD PRESSURE 02/16/22   Pleas Koch, NP  pantoprazole (PROTONIX) 40 MG tablet TAKE 1 TABLET BY MOUTH ONCE A DAY 04/11/22   Lucilla Lame, MD      Allergies    Bactrim [sulfamethoxazole-trimethoprim], Macrobid [nitrofurantoin macrocrystal], Tizanidine, Vicodin [hydrocodone-acetaminophen], Trileptal [oxcarbazepine], Acetaminophen, Hydrocodone, Nexium [esomeprazole magnesium], Nortriptyline, Prilosec [omeprazole], Sulfa antibiotics, Tamoxifen, Trimethoprim, Wasp venom, Erythromycin, Lamotrigine, and Molds & smuts    Review of Systems   Review of Systems  See HPI  Physical Exam Updated Vital Signs BP 129/72   Pulse 65   Temp (!) 97.5 F (36.4 C)   Resp 13   Ht '5\' 2"'$  (1.575 m)   Wt 61.7 kg   SpO2 97%   BMI 24.88 kg/m  Physical Exam Vitals and nursing note reviewed.  Constitutional:  General: She is not in acute distress.    Appearance: She is well-developed.  HENT:     Head: Normocephalic and atraumatic.  Eyes:     Conjunctiva/sclera: Conjunctivae normal.  Cardiovascular:     Rate and Rhythm: Normal rate and regular rhythm.     Heart sounds: No murmur heard. Pulmonary:     Effort: Pulmonary effort is normal. No respiratory distress.     Breath sounds: Normal breath sounds.  Abdominal:     Palpations: Abdomen is soft.     Tenderness: There is no  abdominal tenderness.  Musculoskeletal:        General: No swelling.     Cervical back: Neck supple.  Skin:    General: Skin is warm and dry.     Capillary Refill: Capillary refill takes less than 2 seconds.     Comments: 6 cm laceration to the right wrist without arterial involvement.  Superficial abrasions to the left hand and right lower extremity.  Neurological:     Mental Status: She is alert and oriented to person, place, and time.     Cranial Nerves: No cranial nerve deficit.     Motor: No weakness.     Comments: Patient noted to have slow speech  Psychiatric:        Mood and Affect: Mood normal.     ED Results / Procedures / Treatments   Labs (all labs ordered are listed, but only abnormal results are displayed) Labs Reviewed  CBC WITH DIFFERENTIAL/PLATELET - Abnormal; Notable for the following components:      Result Value   RBC 3.82 (*)    Hemoglobin 11.8 (*)    HCT 34.6 (*)    All other components within normal limits  COMPREHENSIVE METABOLIC PANEL - Abnormal; Notable for the following components:   Creatinine, Ser 1.21 (*)    Total Bilirubin 0.2 (*)    GFR, Estimated 49 (*)    All other components within normal limits  CK  LACTIC ACID, PLASMA  TROPONIN I (HIGH SENSITIVITY)    EKG EKG Interpretation  Date/Time:  Friday April 13 2022 17:56:08 EST Ventricular Rate:  72 PR Interval:  193 QRS Duration: 101 QT Interval:  413 QTC Calculation: 452 R Axis:   42 Text Interpretation: Sinus rhythm Probable left atrial enlargement No significant change since last tracing Confirmed by Gareth Morgan [phone]) on 04/13/2022 6:08:47 PM  Radiology CT Head Wo Contrast  Result Date: 04/13/2022 CLINICAL DATA:  Trauma. EXAM: CT HEAD WITHOUT CONTRAST CT CERVICAL SPINE WITHOUT CONTRAST TECHNIQUE: Multidetector CT imaging of the head and cervical spine was performed following the standard protocol without intravenous contrast. Multiplanar CT image reconstructions of the  cervical spine were also generated. RADIATION DOSE REDUCTION: This exam was performed according to the departmental dose-optimization program which includes automated exposure control, adjustment of the mA and/or kV according to patient size and/or use of iterative reconstruction technique. COMPARISON:  CT dated 07/03/2021. FINDINGS: CT HEAD FINDINGS Brain: Mild age-related atrophy and chronic microvascular ischemic changes. There is no acute intracranial hemorrhage. No mass effect or midline shift. No extra-axial fluid collection. Vascular: No hyperdense vessel or unexpected calcification. Skull: Normal. Negative for fracture or focal lesion. Sinuses/Orbits: No acute finding. Other: None CT CERVICAL SPINE FINDINGS Alignment: No acute subluxation. C3-C4 and C5-C7 disc spacer and ACDF. The hardware appears intact. Skull base and vertebrae: No acute fracture. Soft tissues and spinal canal: No prevertebral fluid or swelling. No visible canal hematoma. Disc levels:  No acute  findings. Upper chest: Negative. Other: None IMPRESSION: 1. No acute intracranial pathology. 2. No acute/traumatic cervical spine pathology. Electronically Signed   By: Anner Crete M.D.   On: 04/13/2022 19:24   CT Cervical Spine Wo Contrast  Result Date: 04/13/2022 CLINICAL DATA:  Trauma. EXAM: CT HEAD WITHOUT CONTRAST CT CERVICAL SPINE WITHOUT CONTRAST TECHNIQUE: Multidetector CT imaging of the head and cervical spine was performed following the standard protocol without intravenous contrast. Multiplanar CT image reconstructions of the cervical spine were also generated. RADIATION DOSE REDUCTION: This exam was performed according to the departmental dose-optimization program which includes automated exposure control, adjustment of the mA and/or kV according to patient size and/or use of iterative reconstruction technique. COMPARISON:  CT dated 07/03/2021. FINDINGS: CT HEAD FINDINGS Brain: Mild age-related atrophy and chronic microvascular  ischemic changes. There is no acute intracranial hemorrhage. No mass effect or midline shift. No extra-axial fluid collection. Vascular: No hyperdense vessel or unexpected calcification. Skull: Normal. Negative for fracture or focal lesion. Sinuses/Orbits: No acute finding. Other: None CT CERVICAL SPINE FINDINGS Alignment: No acute subluxation. C3-C4 and C5-C7 disc spacer and ACDF. The hardware appears intact. Skull base and vertebrae: No acute fracture. Soft tissues and spinal canal: No prevertebral fluid or swelling. No visible canal hematoma. Disc levels:  No acute findings. Upper chest: Negative. Other: None IMPRESSION: 1. No acute intracranial pathology. 2. No acute/traumatic cervical spine pathology. Electronically Signed   By: Anner Crete M.D.   On: 04/13/2022 19:24   DG Pelvis Portable  Result Date: 04/13/2022 CLINICAL DATA:  Fall EXAM: PORTABLE PELVIS 1-2 VIEWS COMPARISON:  None Available. FINDINGS: There is no evidence of pelvic fracture or diastasis. No pelvic bone lesions are seen. IMPRESSION: Negative. Electronically Signed   By: Rolm Baptise M.D.   On: 04/13/2022 19:07   DG Chest Portable 1 View  Result Date: 04/13/2022 CLINICAL DATA:  Fall EXAM: PORTABLE CHEST 1 VIEW COMPARISON:  06/13/2020 FINDINGS: The heart size and mediastinal contours are within normal limits. Both lungs are clear. The visualized skeletal structures are unremarkable. No pneumothorax. No radiopaque foreign bodies. IMPRESSION: No active disease. Electronically Signed   By: Rolm Baptise M.D.   On: 04/13/2022 19:07   DG Tibia/Fibula Right  Result Date: 04/13/2022 CLINICAL DATA:  Fall EXAM: RIGHT TIBIA AND FIBULA - 2 VIEW COMPARISON:  None Available. FINDINGS: There is no evidence of fracture or other focal bone lesions. Soft tissues are unremarkable. IMPRESSION: Negative. Electronically Signed   By: Rolm Baptise M.D.   On: 04/13/2022 19:06   DG Forearm Right  Result Date: 04/13/2022 CLINICAL DATA:  Fall EXAM:  RIGHT FOREARM - 2 VIEW COMPARISON:  None Available. FINDINGS: There is no evidence of fracture or other focal bone lesions. Soft tissues are unremarkable. No radiopaque foreign body. IMPRESSION: Negative. Electronically Signed   By: Rolm Baptise M.D.   On: 04/13/2022 19:06   DG Hand 2 View Left  Result Date: 04/13/2022 CLINICAL DATA:  Fall from ladder. EXAM: LEFT HAND - 2 VIEW COMPARISON:  None Available. FINDINGS: Advanced degenerative changes at the 1st carpometacarpal joint. Early degenerative changes in the IP joints. No acute bony abnormality. Specifically, no fracture, subluxation, or dislocation. No radiopaque foreign bodies. IMPRESSION: No acute bony abnormality. Electronically Signed   By: Rolm Baptise M.D.   On: 04/13/2022 19:05   DG Hand 2 View Right  Result Date: 04/13/2022 CLINICAL DATA:  Fall EXAM: RIGHT HAND - 2 VIEW COMPARISON:  None Available. FINDINGS: No acute fracture or dislocation.  There is some soft tissue swelling of the wrist. No definite radiopaque foreign body identified. There are severe degenerative changes of the first carpometacarpal joint. There are moderate degenerative changes of the distal interphalangeal joints. No acute fracture or dislocation. IMPRESSION: 1. No acute fracture or dislocation. 2. Soft tissue swelling of the wrist. 3. Degenerative changes as described above. Electronically Signed   By: Ronney Asters M.D.   On: 04/13/2022 19:05    Procedures .Marland KitchenLaceration Repair  Date/Time: 04/13/2022 10:30 PM  Performed by: Dani Gobble, MD Authorized by: Gareth Morgan, MD   Consent:    Consent obtained:  Verbal   Consent given by:  Patient   Risks, benefits, and alternatives were discussed: yes     Risks discussed:  Infection, pain, poor wound healing, need for additional repair, tendon damage, poor cosmetic result, vascular damage and nerve damage   Alternatives discussed:  No treatment Universal protocol:    Procedure explained and questions answered to  patient or proxy's satisfaction: yes     Relevant documents present and verified: yes     Test results available: yes     Imaging studies available: yes     Required blood products, implants, devices, and special equipment available: yes     Site/side marked: yes     Immediately prior to procedure, a time out was called: yes     Patient identity confirmed:  Verbally with patient and hospital-assigned identification number Anesthesia:    Anesthesia method:  Local infiltration   Local anesthetic:  Lidocaine 1% WITH epi Laceration details:    Location:  Shoulder/arm   Shoulder/arm location:  R lower arm   Length (cm):  6   Depth (mm):  2 Pre-procedure details:    Preparation:  Patient was prepped and draped in usual sterile fashion and imaging obtained to evaluate for foreign bodies Exploration:    Limited defect created (wound extended): no     Imaging obtained: x-ray     Imaging outcome: foreign body not noted     Wound exploration: wound explored through full range of motion and entire depth of wound visualized     Contaminated: no   Treatment:    Area cleansed with:  Saline   Amount of cleaning:  Extensive   Irrigation solution:  Sterile saline   Irrigation volume:  1000   Irrigation method:  Pressure wash   Visualized foreign bodies/material removed: no     Debridement:  None   Undermining:  None Skin repair:    Repair method:  Sutures   Suture size:  4-0   Suture material:  Nylon   Suture technique:  Simple interrupted   Number of sutures:  5 Approximation:    Approximation:  Close Repair type:    Repair type:  Simple Post-procedure details:    Dressing:  Sterile dressing   Procedure completion:  Tolerated well, no immediate complications     Medications Ordered in ED Medications  lidocaine-EPINEPHrine (XYLOCAINE W/EPI) 2 %-1:200000 (PF) injection 10 mL (10 mLs Intradermal Given 04/13/22 2232)  prochlorperazine (COMPAZINE) injection 10 mg (10 mg Intravenous Given  04/13/22 2233)  diphenhydrAMINE (BENADRYL) injection 25 mg (25 mg Intravenous Given 04/13/22 2235)    ED Course/ Medical Decision Making/ A&P                           Medical Decision Making  The patient is a 66 year old female with past medical history of asthma, anxiety, HTN,  hypothyroidism presenting via EMS for evaluation after electrical injury.  The differential diagnosis considered includes: Head injury, arrhythmia, fracture, dislocation.  On initial evaluation, the patient is confused with slow speech.  She is otherwise hemodynamically stable.  The patient's husband is at bedside and states that her speech is slower than usual and that she appears to be experiencing slow cognitive processing.  On physical exam she was noted to have a laceration to the right forearm and superficial skin tears to the left forearm, and right leg.  The patient received an EKG which showed sinus rhythm with appropriate intervals and no ischemic changes.  Her diagnostic workup included a CBC with white blood cell count of 6.8 and hemoglobin of 11.8; CMP with creatinine of 1.21; lactic acid 0.8; CK 57; troponin 4.  The patient's diagnostic imaging included a CT head and CT C-spine which did not show any acute intracranial injury or fracture/malalignment of the C-spine.  The patient also received a chest x-ray and pelvic x-ray which did not show any signs of fracture.  Plain film x-rays of the left and right hand, right forearm, and right tibia/fibula were ordered which not show any signs of fracture or malalignment.  The patient's laceration was closed in accordance with the procedure section of the note above.  While in the emergency department she had significant improvement in her cognitive functioning and speech delay with return to baseline per both her and her husband.  She had developed a headache for which she received a migraine cocktail consisting of Benadryl and Compazine with reported improvement on  reassessment.  Based on the patient's history, physical exam, and diagnostic workup a low voltage electrocution with possible concussion 2/2 her associated fall is favored as likely source of the patient's symptoms.  I doubt serious intracranial injury based on the patient's improvement in cognitive functioning, and negative CT imaging.  The patient also had a EKG with showed sinus rhythm and normal troponin making significant cardiac injury from the electrocution less likely.  The patient was discharged with instructions to follow-up with her PCP in 24-48 hours for reevaluation and printed instructions on sutured wound care and to follow-up for suture removal in 5 to 7 days.  She was given strict return precautions to the emergency department.  Amount and/or Complexity of Data Reviewed Independent Historian: spouse and EMS Labs: ordered. Radiology: ordered.  Risk Prescription drug management.   Patient's presentation is most consistent with acute complicated illness / injury requiring diagnostic workup.         Final Clinical Impression(s) / ED Diagnoses Final diagnoses:  Fall, initial encounter  Electrical accident caused by domestic wiring and appliances, initial encounter    Rx / DC Orders ED Discharge Orders     None         Dani Gobble, MD 04/13/22 [id number]    Gareth Morgan, MD 04/14/22 1352

## 2022-04-16 ENCOUNTER — Telehealth: Payer: Self-pay

## 2022-04-16 NOTE — Telephone Encounter (Signed)
Transition Care Management Unsuccessful Follow-up Telephone Call  Date of discharge and from where:  [facility] 04[facility]13-22 Dx: Fall   Attempts:  1st Attempt  Reason for unsuccessful TCM follow-up call:  Left voice message   Juanda Crumble LPN [place] Direct Dial [phone]

## 2022-04-17 ENCOUNTER — Encounter: Payer: Self-pay | Admitting: Primary Care

## 2022-04-17 ENCOUNTER — Ambulatory Visit: Payer: 59 | Admitting: Primary Care

## 2022-04-17 VITALS — BP 132/84 | HR 88 | Temp 98.0°F | Ht 62.0 in | Wt 137.0 lb

## 2022-04-17 DIAGNOSIS — T754XXA Electrocution, initial encounter: Secondary | ICD-10-CM | POA: Insufficient documentation

## 2022-04-17 HISTORY — DX: Electrocution, initial encounter: T75.4XXA

## 2022-04-17 NOTE — Progress Notes (Signed)
Subjective:    Patient ID: Katie Robbins, female    DOB: 12-26-1955, 66 y.o.   MRN: [id number]  HPI  Tesneem Frankowski is a very pleasant 66 y.o. female with a history of hypertension, asthma, hypothyroidism, TBI with neurocognitive disorder, GERD, CKD, chronic fatigue, headaches, who presents today for ED follow up.   She presented to [facility] on 04/13/22 for electrocution. She was cleaning a lightbulb with a wet rag and was electrocuted. She was unable to let go immediately, fell about 4 feet, doesn't think she lost consciousness. She sustained a laceration to the right forearm and several abrasions to the hands, left forearm, and right leg. She developed an increased headache.  During her stay in the ED she underwent laceration repair to her right arm with 5 sutures, simple inturrupted. She underwent CT head and C-Spine which was negative for acute process; labs which did not show acute process; and chest xray, xray of right hand/forearm/tibia and fibula, and pelvic xray which were negative for acute injury.   She was diagnosed with concussion. Her cognition improved during her stay. She was discharged home with recommendation for PCP evaluation in 24-48 hours and suture removal in 5-7 days.   Since her ED visit she's feeling back to her baseline. She has chronic headaches for which she receives trigger point injections, already had an appointment scheduled for next week for her trigger point injection. She feels that her cognition has returned to baseline. She denies increased headaches, increased dizziness, vomiting. She has several bruises to the left upper extremity and left lower extremity.    Review of Systems  Respiratory:  Negative for shortness of breath.   Cardiovascular:  Negative for chest pain.  Gastrointestinal:  Negative for vomiting.  Neurological:  Negative for headaches.         Past Medical History:  Diagnosis Date   Anxiety    Arthritis    Asthma    Chicken pox     Chronic headaches    Diverticulitis    Essential hypertension    GERD (gastroesophageal reflux disease)    Guillain Barr syndrome ([place]) 1982   H/O breast implant    Heart murmur    Hypertension    Hypothyroidism    Interstitial cystitis    Interstitial cystitis    Mitral prolapse 1985   Nutcracker esophagus    Osteoarthritis     Social History   Socioeconomic History   Marital status: Married    Spouse name: [name]   Number of children: 2   Years of education: Some college   Highest education level: Not on file  Occupational History   Occupation: Retired  Tobacco Use   Smoking status: Never   Smokeless tobacco: Never  Vaping Use   Vaping Use: Never used  Substance and Sexual Activity   Alcohol use: Yes    Comment: socially   Drug use: No   Sexual activity: Yes    Birth control/protection: Surgical  Other Topics Concern   Not on file  Social History Narrative   Lives with husband   Caffeine use: rare   Right handed   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     Social Determinants of Health   Financial Resource Strain: Not on file  Food Insecurity: Not on file  Transportation Needs: Not on file  Physical Activity: Not on file  Stress: Not on file  Social Connections: Not on file  Intimate Partner Violence: Not on file    Past Surgical History:  Procedure  Laterality Date   ABDOMINAL HYSTERECTOMY  2000/2009   APPENDECTOMY  2007   AUGMENTATION MAMMAPLASTY     BOWEL RESECTION  2007   CARDIAC CATHETERIZATION  2016   Clean   CARPAL TUNNEL RELEASE Right    CHOLECYSTECTOMY  2012   CHOLECYSTECTOMY  2012   COLONOSCOPY WITH PROPOFOL N/A 04/13/2020   Procedure: COLONOSCOPY WITH PROPOFOL;  Surgeon: Lin Landsman, MD;  Location: [facility] ENDOSCOPY;  Service: Gastroenterology;  Laterality: N/A;   ESOPHAGOGASTRODUODENOSCOPY (EGD) WITH PROPOFOL N/A 04/13/2020   Procedure: ESOPHAGOGASTRODUODENOSCOPY (EGD) WITH PROPOFOL;  Surgeon: Lin Landsman, MD;  Location: [facility] ENDOSCOPY;   Service: Gastroenterology;  Laterality: N/A;   NECK SURGERY  06/11/2016   plates and screws in neck    SMALL INTESTINE SURGERY  2007   Bowel resection   TONSILLECTOMY     VESICO-VAGINAL FISTULA REPAIR  2007    Family History  Problem Relation Age of Onset   Hypertension Mother    GER disease Mother    Polymyalgia rheumatica Mother    COPD Mother    Osteoarthritis Mother    Osteoporosis Mother    Other Mother        Giant Cell Arteritis    GER disease Father    Heart attack Father     Allergies  Allergen Reactions   Bactrim [Sulfamethoxazole-Trimethoprim] Hives   Macrobid [Nitrofurantoin Macrocrystal] Hives   Tizanidine Other (See Comments)    Bad Mood changs   Vicodin [Hydrocodone-Acetaminophen] Swelling    Puffy eyes and face   Trileptal [Oxcarbazepine] Rash   Acetaminophen     Other reaction(s): Puffy face/eyes   Hydrocodone    Nexium [Esomeprazole Magnesium]     Face swelling, hives    Nortriptyline     insomnia   Prilosec [Omeprazole]     Face swelling, hives    Sulfa Antibiotics    Tamoxifen    Trimethoprim     Other reaction(s): Hives   Wasp Venom Hives    swelling   Erythromycin Rash   Lamotrigine Rash   Molds & Smuts Anxiety    Current Outpatient Medications on File Prior to Visit  Medication Sig Dispense Refill   albuterol (VENTOLIN HFA) 108 (90 Base) MCG/ACT inhaler INHALE 2 PUFFS INTO THE LUNGS EVERY 6 HOURS AS NEEDED FOR WHEEZING OR SHORTNESS OF BREATH. 8.5 g 0   amLODipine (NORVASC) 5 MG tablet TAKE 1 TABLET BY MOUTH ONCE DAILY FOR BLOOD PRESSURE 90 tablet 2   amphetamine-dextroamphetamine (ADDERALL) 20 MG tablet Take 20 mg by mouth in the morning, at noon, and at bedtime.     aspirin 81 MG EC tablet Take 81 mg by mouth daily.     budesonide-formoterol (SYMBICORT) 160-4.5 MCG/ACT inhaler Inhale 2 puffs into the lungs 2 (two) times daily. 10.2 g 4   cyanocobalamin (,VITAMIN B-12,) 1000 MCG/ML injection Inject 1 vial into the muscle every 3-4  weeks. 12 mL 1   diazepam (VALIUM) 5 MG tablet Take 10 mg by mouth in the morning and at bedtime.     EPINEPHrine 0.3 mg/0.3 mL IJ SOAJ injection Inject 0.3 mLs (0.3 mg total) into the muscle as needed for anaphylaxis. 1 each 0   estradiol (ESTRACE) 1 MG tablet Take 1 tablet (1 mg total) by mouth daily. 30 tablet 11   levothyroxine (SYNTHROID) 100 MCG tablet Take 1 tablet (100 mcg total) by mouth as directed. 1 tablet Monday through Friday 60 tablet 1   levothyroxine (SYNTHROID) 112 MCG tablet Take 1 tablet (112  mcg total) by mouth as directed. 1 tablet on Saturday and Sunday 24 tablet 1   olmesartan (BENICAR) 20 MG tablet TAKE 1 TABLET BY MOUTH TWICE A DAY FOR BLOOD PRESSURE 180 tablet 2   pantoprazole (PROTONIX) 40 MG tablet TAKE 1 TABLET BY MOUTH ONCE A DAY 90 tablet 0   No current facility-administered medications on file prior to visit.    BP 132/84   Pulse 88   Temp 98 F (36.7 C) (Temporal)   Ht '5\' 2"'$  (1.575 m)   Wt 137 lb (62.1 kg)   SpO2 99%   BMI 25.06 kg/m  Objective:   Physical Exam Cardiovascular:     Rate and Rhythm: Normal rate and regular rhythm.  Pulmonary:     Effort: Pulmonary effort is normal.     Breath sounds: Normal breath sounds.  Skin:    General: Skin is warm and dry.     Findings: Bruising present.     Comments: Closed laceration to right anterior wrist.  Sutures intact.    Closed laceration to left palm, no infection.   Neurological:     Mental Status: She is alert and oriented to person, place, and time.           Assessment & Plan:   Problem List Items Addressed This Visit       Other   Electrocution - Primary    [place].  Reviewed ED notes, labs, imaging. Overall cognition appears to be at baseline.  Recommend suture removal in 2 days. No complications of infection or re-injury on exam.   Continue daily dressings and home care.             Pleas Koch, NP

## 2022-04-17 NOTE — Telephone Encounter (Signed)
Pt has fu appt with PCP - tcm not completed

## 2022-04-17 NOTE — Patient Instructions (Signed)
Your sutures should be removed in 2 days as discussed.  It was a pleasure to see you today!

## 2022-04-17 NOTE — Assessment & Plan Note (Addendum)
[facility].  Reviewed ED notes, labs, imaging. Overall cognition appears to be at baseline.  Recommend suture removal in 2 days. No complications of infection or re-injury on exam.   Continue daily dressings and home care.

## 2022-04-20 ENCOUNTER — Ambulatory Visit: Payer: 59 | Admitting: Primary Care

## 2022-04-25 ENCOUNTER — Telehealth: Payer: Self-pay | Admitting: Primary Care

## 2022-04-25 NOTE — Telephone Encounter (Signed)
Patient records was emailed over. Placed in [facility].

## 2022-04-25 NOTE — Telephone Encounter (Signed)
Records placed in [facility] box for review

## 2022-04-27 ENCOUNTER — Encounter: Payer: Self-pay | Admitting: Internal Medicine

## 2022-04-27 ENCOUNTER — Ambulatory Visit: Payer: 59 | Admitting: Internal Medicine

## 2022-04-27 VITALS — BP 126/78 | HR 82 | Ht 62.0 in | Wt 137.2 lb

## 2022-04-27 DIAGNOSIS — E039 Hypothyroidism, unspecified: Secondary | ICD-10-CM | POA: Diagnosis not present

## 2022-04-27 LAB — TSH: TSH: 0.14 u[IU]/mL — ABNORMAL LOW (ref 0.35–5.50)

## 2022-04-27 LAB — T4, FREE: Free T4: 0.89 ng/dL (ref 0.60–1.60)

## 2022-04-27 MED ORDER — LEVOTHYROXINE SODIUM 112 MCG PO TABS: 112.0000 ug | ORAL_TABLET | ORAL | 3 refills | Status: DC

## 2022-04-27 MED ORDER — LEVOTHYROXINE SODIUM 100 MCG PO TABS: 100.0000 ug | ORAL_TABLET | ORAL | 3 refills | Status: DC

## 2022-04-27 NOTE — Progress Notes (Signed)
Name: Katie Robbins  MRN/ DOB: [id number], 1956/03/20    Age/ Sex: 66 y.o., female    PCP: Katie Koch, NP   Reason for Endocrinology Evaluation: Hypothyroidism     Date of Initial Endocrinology Evaluation: 12/06/2021    HPI: [name] is a 66 y.o. female with a past medical history of HTN, asthma, hypothyroid, neuropathy,Hx Guillain-Barr syndrome (1982). The patient presented for initial endocrinology clinic visit on 12/06/2021  for consultative assistance with her Hypothyroidism.   She has been on levothyroxine 1994   Denies prior exposure to radiation and thyroid sx   Over the years she has had fluctuating TSH ranging from 0.11 uIU/mL in 07/2021 to 7.05 uIU/mL in 08/2019    Mother with PMR , giant cell arteritis and thyroid disease   She has had neck surgery in 2018  SUBJECTIVE:    Today (04/27/22):  Katie Robbins is here for a follow up on Hypothyroidism.  She continues with multiple neurological symptoms, Dx with GBS , follows with neurology  She associates her levothyroxine dose with GBS. She is s/p fall 04/13/2022, imaging did not show any acute injury except for laceration  She was seen by neurosurgery 04/25/2022 Sees Psychiatry for addrall and valium  She is also on Klonopin  Had recent TIA  No significant change in weight since 11/2021  She has noted new growth in the hair  Finger nails growing  Denies palpitations  Has chronic  constipation  Has tremors of the hands  She has weakness and fatigue  Uses pill box   Takes protonix at dinner time    Continue levothyroxine 100 mcg x 5 days a week Monday through friday Continue levothyroxine 112 mcg x 2 days a week sat and Sunday     HISTORY:  Past Medical History:  Past Medical History:  Diagnosis Date   Anxiety    Arthritis    Asthma    Chicken pox    Chronic headaches    Diverticulitis    Essential hypertension    GERD (gastroesophageal reflux disease)    Guillain Barr  syndrome ([place]) 1982   H/O breast implant    Heart murmur    Hypertension    Hypothyroidism    Interstitial cystitis    Interstitial cystitis    Mitral prolapse 1985   Nutcracker esophagus    Osteoarthritis    Past Surgical History:  Past Surgical History:  Procedure Laterality Date   ABDOMINAL HYSTERECTOMY  2000/2009   APPENDECTOMY  2007   AUGMENTATION MAMMAPLASTY     BOWEL RESECTION  2007   CARDIAC CATHETERIZATION  2016   Clean   CARPAL TUNNEL RELEASE Right    CHOLECYSTECTOMY  2012   CHOLECYSTECTOMY  2012   COLONOSCOPY WITH PROPOFOL N/A 04/13/2020   Procedure: COLONOSCOPY WITH PROPOFOL;  Surgeon: Lin Landsman, MD;  Location: [facility];  Service: Gastroenterology;  Laterality: N/A;   ESOPHAGOGASTRODUODENOSCOPY (EGD) WITH PROPOFOL N/A 04/13/2020   Procedure: ESOPHAGOGASTRODUODENOSCOPY (EGD) WITH PROPOFOL;  Surgeon: Lin Landsman, MD;  Location: [facility] ENDOSCOPY;  Service: Gastroenterology;  Laterality: N/A;   NECK SURGERY  06/11/2016   plates and screws in neck    SMALL INTESTINE SURGERY  2007   Bowel resection   TONSILLECTOMY     VESICO-VAGINAL FISTULA REPAIR  2007    Social History:  reports that she has never smoked. She has never used smokeless tobacco. She reports current alcohol use. She reports that she does not use drugs. Family History:  family history includes COPD in her mother; GER disease in her father and mother; Heart attack in her father; Hypertension in her mother; Osteoarthritis in her mother; Osteoporosis in her mother; Other in her mother; Polymyalgia rheumatica in her mother.   HOME MEDICATIONS: Allergies as of 04/27/2022       Reactions   Bactrim [sulfamethoxazole-trimethoprim] Hives   Macrobid [nitrofurantoin Macrocrystal] Hives   Tizanidine Other (See Comments)   Bad Mood changs   Vicodin [hydrocodone-acetaminophen] Swelling   Puffy eyes and face   Trileptal [oxcarbazepine] Rash   Acetaminophen    Other reaction(s): Puffy  face/eyes   Hydrocodone    Nexium [esomeprazole Magnesium]    Face swelling, hives    Nortriptyline    insomnia   Prilosec [omeprazole]    Face swelling, hives    Sulfa Antibiotics    Tamoxifen    Trimethoprim    Other reaction(s): Hives   Wasp Venom Hives   swelling   Erythromycin Rash   Lamotrigine Rash   Molds & Smuts Anxiety        Medication List        Accurate as of April 27, 2022 11:25 AM. If you have any questions, ask your nurse or doctor.          albuterol 108 (90 Base) MCG/ACT inhaler Commonly known as: VENTOLIN HFA INHALE 2 PUFFS INTO THE LUNGS EVERY 6 HOURS AS NEEDED FOR WHEEZING OR SHORTNESS OF BREATH.   amLODipine 5 MG tablet Commonly known as: NORVASC TAKE 1 TABLET BY MOUTH ONCE DAILY FOR BLOOD PRESSURE   amphetamine-dextroamphetamine 20 MG tablet Commonly known as: ADDERALL Take 20 mg by mouth in the morning, at noon, and at bedtime.   aspirin EC 81 MG tablet Take 81 mg by mouth daily.   budesonide-formoterol 160-4.5 MCG/ACT inhaler Commonly known as: Symbicort Inhale 2 puffs into the lungs 2 (two) times daily.   cyanocobalamin 1000 MCG/ML injection Commonly known as: VITAMIN B12 Inject 1 vial into the muscle every 3-4 weeks.   diazepam 5 MG tablet Commonly known as: VALIUM Take 10 mg by mouth in the morning and at bedtime.   EPINEPHrine 0.3 mg/0.3 mL Soaj injection Commonly known as: EPI-PEN Inject 0.3 mLs (0.3 mg total) into the muscle as needed for anaphylaxis.   estradiol 1 MG tablet Commonly known as: ESTRACE Take 1 tablet (1 mg total) by mouth daily.   gabapentin 100 MG capsule Commonly known as: NEURONTIN Take 100 mg by mouth 3 (three) times daily.   levothyroxine 100 MCG tablet Commonly known as: SYNTHROID Take 1 tablet (100 mcg total) by mouth as directed. 1 tablet Monday through Friday   levothyroxine 112 MCG tablet Commonly known as: SYNTHROID Take 1 tablet (112 mcg total) by mouth as directed. 1 tablet on  Saturday and Sunday   meloxicam 15 MG tablet Commonly known as: MOBIC Take 15 mg by mouth daily.   olmesartan 20 MG tablet Commonly known as: BENICAR TAKE 1 TABLET BY MOUTH TWICE A DAY FOR BLOOD PRESSURE   pantoprazole 40 MG tablet Commonly known as: PROTONIX TAKE 1 TABLET BY MOUTH ONCE A DAY          REVIEW OF SYSTEMS: A comprehensive ROS was conducted with the patient and is negative except as per HPI    OBJECTIVE:  VS: BP 126/78 (BP Location: Left Arm, Patient Position: Sitting, Cuff Size: Large)   Pulse 82   Ht '5\' 2"'$  (1.575 m)   Wt 137 lb 3.2 oz (62.2  kg)   SpO2 96%   BMI 25.09 kg/m    Wt Readings from Last 3 Encounters:  04/27/22 137 lb 3.2 oz (62.2 kg)  04/17/22 137 lb (62.1 kg)  04/13/22 136 lb 0.4 oz (61.7 kg)     EXAM: General: Pt appears well and is in NAD  Neck: General: Supple without adenopathy. Thyroid: Thyroid size normal.  No goiter or nodules appreciated.  Lungs: Clear with good BS bilat with no rales, rhonchi, or wheezes  Heart: Auscultation: RRR.  Abdomen: Normoactive bowel sounds, soft, nontender, without masses or organomegaly palpable  Extremities:  BL LE: No pretibial edema normal ROM and strength.  Mental Status: Judgment, insight: Intact Orientation: Oriented to time, place, and person Mood and affect: No depression, anxiety, or agitation     DATA REVIEWED:     Latest Reference Range & Units 04/27/22 11:19  TSH 0.35 - 5.50 uIU/mL 0.14 (L)  T4,Free(Direct) 0.60 - 1.60 ng/dL 0.89     ASSESSMENT/PLAN/RECOMMENDATIONS:   Hypothyroidism:  -Clinically she is feeling better -No local neck symptoms -She feels better when her TSH is within mid range -TSH is low, will decrease levothyroxine as below   Medications : Continue levothyroxine 100 mcg x 6 days a week Continue levothyroxine 112 mcg x  days a week (Sunday)   Follow-up in 6 months Labs in 2 months  Signed electronically by: Mack Guise, MD  [facility]  Endocrinology  [address] [place], [place] [address] Phone: [phone] FAX: [phone]   CC: Katie Koch, NP [place] [address] Phone: [phone] Fax: [phone]   Return to Endocrinology clinic as below: Future Appointments  Date Time Provider [place]  05/30/2022  9:40 AM End, Harrell Gave, MD [facility] None  06/01/2022 11:00 AM Bynum, Peggy E, LCSW [facility] None  06/15/2022 11:00 AM Bynum, Peggy E, LCSW [facility] None  06/29/2022 11:00 AM Bynum, Peggy E, LCSW [facility] None

## 2022-04-27 NOTE — Patient Instructions (Signed)

## 2022-05-01 ENCOUNTER — Encounter: Payer: Self-pay | Admitting: Internal Medicine

## 2022-05-16 ENCOUNTER — Ambulatory Visit (INDEPENDENT_AMBULATORY_CARE_PROVIDER_SITE_OTHER): Payer: 59 | Admitting: Psychiatry

## 2022-05-16 DIAGNOSIS — S069XAS Unspecified intracranial injury with loss of consciousness status unknown, sequela: Secondary | ICD-10-CM | POA: Diagnosis not present

## 2022-05-16 DIAGNOSIS — S069XAD Unspecified intracranial injury with loss of consciousness status unknown, subsequent encounter: Secondary | ICD-10-CM | POA: Diagnosis not present

## 2022-05-16 DIAGNOSIS — F411 Generalized anxiety disorder: Secondary | ICD-10-CM

## 2022-05-16 DIAGNOSIS — F0630 Mood disorder due to known physiological condition, unspecified: Secondary | ICD-10-CM | POA: Diagnosis not present

## 2022-05-16 NOTE — Progress Notes (Signed)
Virtual Visit via Video Note  I connected with Katie Robbins on 05/16/22 at 2:06 PM EST by a video enabled telemedicine application and verified that I am speaking with the correct person using two identifiers.  Location: Patient: Home Provider: [place] office    I discussed the limitations of evaluation and management by telemedicine and the availability of in person appointments. The patient expressed understanding and agreed to proceed.   I provided 49 minutes of non-face-to-face time during this encounter.   [name], LCSW   THERAPIST PROGRESS NOTE  Session Time: Wednesday 05/16/2021 2:06 PM - 1:55 PM   Participation Level: Active  Behavioral Response: Alert/Anxious/Talkative/tangentiality/tearful at times  Type of Therapy: Individual Therapy  Treatment Goals addressed: Learn and implement interpersonal skills  Progress on goals: Will develop treatment plan and next session   Interventions: CBT and Supportive  Summary: Katie Robbins is a 67 y.o. female who initially was referred for services for continuity of care while her previous therapist was on medical leave.  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 She is a returning patient to this clinician and last was seen in July 2023.  Per patient's report, her husband urged her to call as she cannot have a conversation with other people without interrupting.  She reports being irritable and staying manage with her husband for a couple of days if he says the wrong thing.  Patient states being very moody and becoming upset easily over little things.  She also reports crying almost daily.  She reports decreased interest and involvement in activities.  She states not leaving the house or socializing.  Patient states feeling stressed about possibly saying wrong thing to husband as she fears he will leave her although she states knowing he will not leave.     Patient last was seen via virtual visit about 2 weeks ago.  She states having a good quiet Christmas and New Year's holiday.  She continues to experience depressed mood, tearfulness, worry, and anxiety.  She reports stress regarding interaction between she and her husband as she reports continued irritability.  She expresses frustration regarding their involvement with another couple as patient reports feeling uncomfortable with other couple due to comments from them patient considers disrespectful.  Patient reports she did express concerns to her husband this past weekend.  She continues to worry about her relationship with her husband.  She also expresses sadness and frustration about her children as they have only visited her a few times during the past 9 years she has been residing in  [place].  She reports thoughts of sometimes just not wanting to be here but denies any plan or intent to harm self.  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 Erling Cruz for medication management  Patient/Guardian was advised Release of Information must be obtained prior to any record release in order to collaborate their care with  an outside provider. Patient/Guardian was advised if they have not already done so to contact the registration department to  sign all necessary forms in order for [place] to release information regarding their care.   Consent: Patient/Guardian gives verbal consent for treatment and assignment of benefits for services provided during this visit. Patient/Guardian expressed understanding and agreed to proceed.      [name], LCSW 05/16/2022   Katie Robbins E Katie Cleckler LCSW

## 2022-05-23 ENCOUNTER — Ambulatory Visit: Payer: 59 | Admitting: Family Medicine

## 2022-05-23 ENCOUNTER — Encounter: Payer: Self-pay | Admitting: Family Medicine

## 2022-05-23 VITALS — BP 134/70 | HR 85 | Temp 97.5°F | Ht 62.0 in | Wt 135.5 lb

## 2022-05-23 DIAGNOSIS — R32 Unspecified urinary incontinence: Secondary | ICD-10-CM

## 2022-05-23 DIAGNOSIS — N3010 Interstitial cystitis (chronic) without hematuria: Secondary | ICD-10-CM | POA: Diagnosis not present

## 2022-05-23 DIAGNOSIS — H6121 Impacted cerumen, right ear: Secondary | ICD-10-CM | POA: Diagnosis not present

## 2022-05-23 DIAGNOSIS — R3989 Other symptoms and signs involving the genitourinary system: Secondary | ICD-10-CM | POA: Insufficient documentation

## 2022-05-23 LAB — POCT UA - MICROSCOPIC ONLY
Casts, Ur, LPF, POC: 0
RBC, Urine, Miroscopic: 0 (ref 0–2)
Yeast, UA: 0

## 2022-05-23 LAB — POC URINALSYSI DIPSTICK (AUTOMATED)
Bilirubin, UA: NEGATIVE
Blood, UA: NEGATIVE
Glucose, UA: NEGATIVE
Ketones, UA: NEGATIVE
Leukocytes, UA: NEGATIVE
Nitrite, UA: NEGATIVE
Protein, UA: POSITIVE — AB
Spec Grav, UA: 1.02 (ref 1.010–1.025)
Urobilinogen, UA: 0.2 E.U./dL
pH, UA: 6 (ref 5.0–8.0)

## 2022-05-23 NOTE — Assessment & Plan Note (Signed)
This may be cause of intermittent discomfort Cerumen is deep and dry appearing, will need to soften with debrox before attempt at removal Pt wishes to try this at home first  If not imp in a week adv return for irrigation here   Update if not starting to improve in a week or if worsening

## 2022-05-23 NOTE — Assessment & Plan Note (Addendum)
Worse lately  Reviewed old records, has bladder prolapse and sees Dr Almetta Lovely neg  Cx pending  Has urology f/u this mo

## 2022-05-23 NOTE — Assessment & Plan Note (Signed)
Unsure if this is IC or not but ua is clear  Some sediment /cloudiness-noted on micro Sent for cx -will update  Inst to hold benadryl, may give her trouble emptying  Fluids /water   Update if not starting to improve in a week or if worsening  Pending cx result

## 2022-05-23 NOTE — Assessment & Plan Note (Signed)
Unsure if cause of today's bladder pain  Urine culture pending  F/u with urology later this mo

## 2022-05-23 NOTE — Patient Instructions (Addendum)
I will send a urine culture - we wil call you with a result  Keep drinking lots of fluids   Hold off on benadryl since it can make it harder to urinate   You have some wax in the right ear Use a product like debrox for a week or so and see if this helps (don't irrigate the ear until you use this)   If the ear pain worsens let us know

## 2022-05-23 NOTE — Progress Notes (Signed)
Subjective:    Patient ID: Katie Robbins, female    DOB: March 27, 1956, 67 y.o.   MRN: [id number]  HPI 67 yo pt of NP Clark presents with ear pain and urinary symptoms   Wt Readings from Last 3 Encounters:  05/23/22 135 lb 8 oz (61.5 kg)  04/27/22 137 lb 3.2 oz (62.2 kg)  04/17/22 137 lb (62.1 kg)   24.78 kg/m  Vitals:   05/23/22 1151  BP: 134/70  Pulse: 85  Temp: (!) 97.5 F (36.4 C)  TempSrc: Temporal  SpO2: 100%  Weight: 135 lb 8 oz (61.5 kg)  Height: '5\' 2"'$  (1.575 m)   Bladder hurts  Has a h/o incontinence , prolapsed bladder  Worse sat night - when in bed / had to wear a diaper  Worse than usual  No blood  No burning to urinate  A hard time urinating due to prolaped bladder   Drinks lots of water  Takes some benadryl    Urologist Dr Karsten Ro appt end of the month  Sees her for  IC   (watches her diet for this)  Used to have therapy for that  Hysterectomy in the past  Bowel resection in the past   Sees Dr Amalia Hailey for gyn   Results for orders placed or performed in visit on 05/23/22  POCT Urinalysis Dipstick (Automated)  Result Value Ref Range   Color, UA Yellow    Clarity, UA Cloudy    Glucose, UA Negative Negative   Bilirubin, UA Negative    Ketones, UA Negative    Spec Grav, UA 1.020 1.010 - 1.025   Blood, UA Negative    pH, UA 6.0 5.0 - 8.0   Protein, UA Positive (A) Negative   Urobilinogen, UA 0.2 0.2 or 1.0 E.U./dL   Nitrite, UA Negative    Leukocytes, UA Negative Negative  POCT UA - Microscopic Only  Result Value Ref Range   WBC, Ur, HPF, POC 0-1 0 - 5   RBC, Urine, Miroscopic 0 0 - 2   Bacteria, U Microscopic few None - Trace   Mucus, UA few    Epithelial cells, urine per micros few    Crystals, Ur, HPF, POC few    Casts, Ur, LPF, POC 0    Yeast, UA 0     On micro some sediment noted    Ua clear but looked cloudy    Lab Results  Component Value Date   CREATININE 1.21 (H) 04/13/2022   BUN 16 04/13/2022   NA 136 04/13/2022   K  4.3 04/13/2022   CL 105 04/13/2022   CO2 24 04/13/2022    R ear pain - started Sunday am  Took benadryl Sunday night  No drainage  Worse when she blows her nose  Not as painful today   Not a lot of nasal symptoms   Patient Active Problem List   Diagnosis Date Noted   Bladder pain 05/23/2022   Impacted cerumen, right ear 05/23/2022   Electrocution 04/17/2022   Lower extremity weakness 12/12/2021   Plantar fasciitis, left 01/31/2021   Esophageal spasm 12/09/2020   History of depression 08/16/2020   Sleep disorder, unspecified 08/16/2020   Neck pain 07/28/2020   Non-cardiac chest pain 06/13/2020   Esophageal dysphagia    Soft tissue mass 03/31/2020   Body mass index (BMI) 27.0-27.9, adult 10/22/2019   CKD (chronic kidney disease) stage 3, GFR 30-59 ml/min (HCC) 06/30/2019   Dysuria 03/16/2019   Vitamin B 12 deficiency 01/27/2019  Numbness of hand 01/13/2019   Panic disorder 12/29/2018   Mood disorder as late effect of traumatic brain injury ([place]) 08/07/2018   Mild neurocognitive disorder due to traumatic brain injury ([place]) 08/07/2018   Acute thoracic back pain 07/22/2018   Neuropathy 07/22/2018   Prediabetes 07/08/2018   Numbness and tingling 07/08/2018   Environmental and seasonal allergies 04/24/2018   Dyspnea 02/06/2018   Chronic fatigue 01/23/2018   Ataxia 07/16/2017   Urinary incontinence 07/16/2017   Primary osteoarthritis of left knee 05/29/2017   S/P carpal tunnel release 05/29/2017   Primary osteoarthritis of both hands 05/29/2017   History of bilateral breast implants 05/29/2017   Interstitial cystitis 05/29/2017   History of mitral valve prolapse 05/29/2017   History of diverticulitis 05/29/2017   Carpal tunnel syndrome 05/16/2016   Stenosis of intervertebral foramina 05/16/2016   Degeneration of intervertebral disc of cervical spine without prolapsed disc 05/01/2016   Cervical disc disorder with radiculopathy of cervical region 04/12/2016    Preventative health care 11/28/2015   Esophageal reflux 08/19/2015   Essential hypertension 08/19/2015   Hypothyroidism 08/19/2015   Asthma 08/19/2015   Chronic headaches 08/19/2015   Estrogen deficiency 08/19/2015   Nutcracker esophagus 08/12/2005   Past Medical History:  Diagnosis Date   Anxiety    Arthritis    Asthma    Chicken pox    Chronic headaches    Diverticulitis    Essential hypertension    GERD (gastroesophageal reflux disease)    Guillain Barr syndrome ([place]) 1982   H/O breast implant    Heart murmur    Hypertension    Hypothyroidism    Interstitial cystitis    Interstitial cystitis    Mitral prolapse 1985   Nutcracker esophagus    Osteoarthritis    Past Surgical History:  Procedure Laterality Date   ABDOMINAL HYSTERECTOMY  2000/2009   APPENDECTOMY  2007   AUGMENTATION MAMMAPLASTY     BOWEL RESECTION  2007   CARDIAC CATHETERIZATION  2016   Clean   CARPAL TUNNEL RELEASE Right    CHOLECYSTECTOMY  2012   CHOLECYSTECTOMY  2012   COLONOSCOPY WITH PROPOFOL N/A 04/13/2020   Procedure: COLONOSCOPY WITH PROPOFOL;  Surgeon: Lin Landsman, MD;  Location: [facility] ENDOSCOPY;  Service: Gastroenterology;  Laterality: N/A;   ESOPHAGOGASTRODUODENOSCOPY (EGD) WITH PROPOFOL N/A 04/13/2020   Procedure: ESOPHAGOGASTRODUODENOSCOPY (EGD) WITH PROPOFOL;  Surgeon: Lin Landsman, MD;  Location: [facility] ENDOSCOPY;  Service: Gastroenterology;  Laterality: N/A;   NECK SURGERY  06/11/2016   plates and screws in neck    SMALL INTESTINE SURGERY  2007   Bowel resection   TONSILLECTOMY     VESICO-VAGINAL FISTULA REPAIR  2007   Social History   Tobacco Use   Smoking status: Never   Smokeless tobacco: Never  Vaping Use   Vaping Use: Never used  Substance Use Topics   Alcohol use: Yes    Comment: socially   Drug use: No   Family History  Problem Relation Age of Onset   Hypertension Mother    GER disease Mother    Polymyalgia rheumatica Mother    COPD Mother     Osteoarthritis Mother    Osteoporosis Mother    Other Mother        Giant Cell Arteritis    GER disease Father    Heart attack Father    Allergies  Allergen Reactions   Bactrim [Sulfamethoxazole-Trimethoprim] Hives   Macrobid [Nitrofurantoin Macrocrystal] Hives   Tizanidine Other (See Comments)  Bad Mood changs   Vicodin [Hydrocodone-Acetaminophen] Swelling    Puffy eyes and face   Trileptal [Oxcarbazepine] Rash   Acetaminophen     Other reaction(s): Puffy face/eyes   Hydrocodone    Nexium [Esomeprazole Magnesium]     Face swelling, hives    Nortriptyline     insomnia   Prilosec [Omeprazole]     Face swelling, hives    Sulfa Antibiotics    Tamoxifen    Trimethoprim     Other reaction(s): Hives   Wasp Venom Hives    swelling   Erythromycin Rash   Lamotrigine Rash   Molds & Smuts Anxiety   Current Outpatient Medications on File Prior to Visit  Medication Sig Dispense Refill   albuterol (VENTOLIN HFA) 108 (90 Base) MCG/ACT inhaler INHALE 2 PUFFS INTO THE LUNGS EVERY 6 HOURS AS NEEDED FOR WHEEZING OR SHORTNESS OF BREATH. 8.5 g 0   amLODipine (NORVASC) 5 MG tablet TAKE 1 TABLET BY MOUTH ONCE DAILY FOR BLOOD PRESSURE 90 tablet 2   amphetamine-dextroamphetamine (ADDERALL) 20 MG tablet Take 20 mg by mouth in the morning, at noon, and at bedtime.     aspirin 81 MG EC tablet Take 81 mg by mouth daily.     budesonide-formoterol (SYMBICORT) 160-4.5 MCG/ACT inhaler Inhale 2 puffs into the lungs 2 (two) times daily. 10.2 g 4   cyanocobalamin (,VITAMIN B-12,) 1000 MCG/ML injection Inject 1 vial into the muscle every 3-4 weeks. 12 mL 1   diazepam (VALIUM) 5 MG tablet Take 10 mg by mouth in the morning and at bedtime.     EPINEPHrine 0.3 mg/0.3 mL IJ SOAJ injection Inject 0.3 mLs (0.3 mg total) into the muscle as needed for anaphylaxis. 1 each 0   estradiol (ESTRACE) 1 MG tablet Take 1 tablet (1 mg total) by mouth daily. 30 tablet 11   levothyroxine (SYNTHROID) 100 MCG tablet Take 1  tablet (100 mcg total) by mouth as directed. 1 tablet Monday through Saturdays 72 tablet 3   levothyroxine (SYNTHROID) 112 MCG tablet Take 1 tablet (112 mcg total) by mouth as directed. 1 tablet on Sundays 12 tablet 3   meloxicam (MOBIC) 15 MG tablet Take 15 mg by mouth daily.     olmesartan (BENICAR) 20 MG tablet TAKE 1 TABLET BY MOUTH TWICE A DAY FOR BLOOD PRESSURE 180 tablet 2   pantoprazole (PROTONIX) 40 MG tablet TAKE 1 TABLET BY MOUTH ONCE A DAY 90 tablet 0   No current facility-administered medications on file prior to visit.    Review of Systems  Constitutional:  Negative for activity change, appetite change, fatigue, fever and unexpected weight change.  HENT:  Positive for ear pain. Negative for congestion, ear discharge, facial swelling, hearing loss, rhinorrhea, sinus pressure and sore throat.   Eyes:  Negative for pain, redness and visual disturbance.  Respiratory:  Negative for cough, shortness of breath and wheezing.   Cardiovascular:  Negative for chest pain and palpitations.  Gastrointestinal:  Negative for abdominal pain, blood in stool, constipation and diarrhea.  Endocrine: Negative for polydipsia and polyuria.  Genitourinary:  Positive for difficulty urinating and urgency. Negative for dysuria and frequency.       Bladder pain   Musculoskeletal:  Negative for arthralgias, back pain and myalgias.  Skin:  Negative for pallor and rash.  Allergic/Immunologic: Negative for environmental allergies.  Neurological:  Negative for dizziness, syncope and headaches.  Hematological:  Negative for adenopathy. Does not bruise/bleed easily.  Psychiatric/Behavioral:  Negative for decreased concentration and  dysphoric mood. The patient is not nervous/anxious.        Objective:   Physical Exam Constitutional:      General: She is not in acute distress.    Appearance: She is well-developed.  HENT:     Head: Normocephalic and atraumatic.     Right Ear: There is impacted cerumen.      Left Ear: Tympanic membrane, ear canal and external ear normal.     Ears:     Comments: Deep /dry cerumen imp on R Almost totally occludes canal     Mouth/Throat:     Mouth: Mucous membranes are moist.     Pharynx: No oropharyngeal exudate or posterior oropharyngeal erythema.  Eyes:     General:        Right eye: No discharge.        Left eye: No discharge.     Conjunctiva/sclera: Conjunctivae normal.     Pupils: Pupils are equal, round, and reactive to light.  Cardiovascular:     Rate and Rhythm: Normal rate and regular rhythm.     Heart sounds: Normal heart sounds.  Pulmonary:     Effort: Pulmonary effort is normal.     Breath sounds: Normal breath sounds.  Abdominal:     General: Bowel sounds are normal. There is no distension.     Palpations: Abdomen is soft. There is no mass.     Tenderness: There is no abdominal tenderness. There is no right CVA tenderness, left CVA tenderness or rebound.     Comments: No cva tenderness  no suprapubic tenderness or fullness  Musculoskeletal:     Cervical back: Normal range of motion and neck supple.     Right lower leg: No edema.     Left lower leg: No edema.  Lymphadenopathy:     Cervical: No cervical adenopathy.  Skin:    Findings: No rash.  Neurological:     Mental Status: She is alert.  Psychiatric:        Mood and Affect: Mood normal.           Assessment & Plan:   Problem List Items Addressed This Visit       Nervous and Auditory   Impacted cerumen, right ear    This may be cause of intermittent discomfort Cerumen is deep and dry appearing, will need to soften with debrox before attempt at removal Pt wishes to try this at home first  If not imp in a week adv return for irrigation here   Update if not starting to improve in a week or if worsening          Genitourinary   Interstitial cystitis    Unsure if cause of today's bladder pain  Urine culture pending  F/u with urology later this mo        Other    Bladder pain - Primary    Unsure if this is IC or not but ua is clear  Some sediment /cloudiness-noted on micro Sent for cx -will update  Inst to hold benadryl, may give her trouble emptying  Fluids /water   Update if not starting to improve in a week or if worsening  Pending cx result       Relevant Orders   Urine Culture   POCT UA - Microscopic Only (Completed)   Urinary incontinence    Worse lately  Reviewed old records, has bladder prolapse and sees Dr Almetta Lovely neg  Cx pending  Has urology  f/u this mo      Relevant Orders   POCT Urinalysis Dipstick (Automated) (Completed)   Urine Culture   POCT UA - Microscopic Only (Completed)

## 2022-05-24 LAB — URINE CULTURE
MICRO NUMBER:: 14413305
Result:: NO GROWTH
SPECIMEN QUALITY:: ADEQUATE

## 2022-05-28 NOTE — Progress Notes (Signed)
New Outpatient Visit Date: 05/30/2022  Referring Provider: Pleas Koch, NP [address] [place],  [place] [address]  Chief Complaint: Chest pain and shortness of breath  HPI:  Katie Robbins is a 67 y.o. female who is being seen today for the evaluation of exertional dyspnea and chronic fatigue at the request of Katie Robbins. She has a history of hypertension, questionable mitral valve prolapse, chronic chest pain attributed to nutcracker esophagus, left upper extremity DVT related to PICC line (~2007), hypothyroidism, osteoarthritis, and anxiety.  Katie Robbins reports that she has had chest pain and shortness of breath for many years, previously evaluated by [name] in [facility].  She notes that even mild activities like changing her bed linens or doing laundry because profound exhaustion for the rest of the day, carrying into the following day.  She gets out of breath walking from one room to the other in her house and also notes some associated chest pain.  The chest pain is also present when she lies down at night.  It is typically on the left side extending from lateral to the left breast up to her shoulder and neck.  She wonders if this may be related to the DVT she had over 15 years ago in the left arm from a PICC line.  She notes that she was treated for several years with warfarin.  She also has numbness and tingling in the left arm for which she has previously been evaluated by a neurologist.  She also reports having TIAs at least once a year.  She denies edema.  Katie Robbins reports having been hospitalized several years ago after an abnormal blood pressure check and ensuing EKGs performed by her PCP.  She believes that she underwent multiple tests in [facility], including a cardiac catheterization ([facility] does not mention any of these tests).  Office note from [name] on 12/03/2017 indicates no known cardiac disease other than questionable mitral valve prolapse.  He recommended  a stress echocardiogram, which was normal but submaximal in intensity (patient exercised for 3 minutes, achieving 84% of MPHR).  [name] reports that her chest pain symptoms have previously been attributed to nutcracker esophagus for which she takes amlodipine.  --------------------------------------------------------------------------------------------------  Cardiovascular History & Procedures: Cardiovascular Problems: Chest pain Shortness of breath  Risk Factors: Hypertension and age > 68  Cath/PCI: None available  CV Surgery: None  EP Procedures and Devices: None  Non-Invasive Evaluation(s): Stress echo (12/24/2017): Poor exercise capacity with submaximal heart rate (84% MPHR).  No wall motion abnormalities identified at rest or during stress.  Recent CV Pertinent Labs: Lab Results  Component Value Date   CHOL 197 12/12/2021   HDL 76.00 12/12/2021   LDLCALC 94 12/12/2021   TRIG 136.0 12/12/2021   CHOLHDL 3 12/12/2021   BNP 25.3 01/14/2018   K 4.3 04/13/2022   BUN 16 04/13/2022   CREATININE 1.21 (H) 04/13/2022   CREATININE 1.04 (H) 11/23/2016    --------------------------------------------------------------------------------------------------  Past Medical History:  Diagnosis Date   Anxiety    Arthritis    Asthma    Chicken pox    Chronic headaches    Diverticulitis    Essential hypertension    GERD (gastroesophageal reflux disease)    Guillain Barr syndrome ([place]) 1982   H/O breast implant    Heart murmur    Hypertension    Hypothyroidism    Interstitial cystitis    Interstitial cystitis    Mitral prolapse 1985  Nutcracker esophagus    Osteoarthritis     Past Surgical History:  Procedure Laterality Date   ABDOMINAL HYSTERECTOMY  2000/2009   APPENDECTOMY  2007   AUGMENTATION MAMMAPLASTY     BOWEL RESECTION  2007   CARDIAC CATHETERIZATION  2016   Clean   CARPAL TUNNEL RELEASE Right    CHOLECYSTECTOMY  2012   CHOLECYSTECTOMY  2012    COLONOSCOPY WITH PROPOFOL N/A 04/13/2020   Procedure: COLONOSCOPY WITH PROPOFOL;  Surgeon: Lin Landsman, MD;  Location: [facility] ENDOSCOPY;  Service: Gastroenterology;  Laterality: N/A;   ESOPHAGOGASTRODUODENOSCOPY (EGD) WITH PROPOFOL N/A 04/13/2020   Procedure: ESOPHAGOGASTRODUODENOSCOPY (EGD) WITH PROPOFOL;  Surgeon: Lin Landsman, MD;  Location: [facility] ENDOSCOPY;  Service: Gastroenterology;  Laterality: N/A;   NECK SURGERY  06/11/2016   plates and screws in neck    SMALL INTESTINE SURGERY  2007   Bowel resection   TONSILLECTOMY     VESICO-VAGINAL FISTULA REPAIR  2007    Current Meds  Medication Sig   albuterol (VENTOLIN HFA) 108 (90 Base) MCG/ACT inhaler INHALE 2 PUFFS INTO THE LUNGS EVERY 6 HOURS AS NEEDED FOR WHEEZING OR SHORTNESS OF BREATH.   amLODipine (NORVASC) 5 MG tablet TAKE 1 TABLET BY MOUTH ONCE DAILY FOR BLOOD PRESSURE   amphetamine-dextroamphetamine (ADDERALL) 20 MG tablet Take 20 mg by mouth in the morning, at noon, and at bedtime.   aspirin 81 MG EC tablet Take 81 mg by mouth daily.   budesonide-formoterol (SYMBICORT) 160-4.5 MCG/ACT inhaler Inhale 2 puffs into the lungs 2 (two) times daily.   cyanocobalamin (,VITAMIN B-12,) 1000 MCG/ML injection Inject 1 vial into the muscle every 3-4 weeks.   diazepam (VALIUM) 5 MG tablet Take 10 mg by mouth in the morning and at bedtime.   EPINEPHrine 0.3 mg/0.3 mL IJ SOAJ injection Inject 0.3 mLs (0.3 mg total) into the muscle as needed for anaphylaxis.   estradiol (ESTRACE) 1 MG tablet Take 1 tablet (1 mg total) by mouth daily.   levothyroxine (SYNTHROID) 100 MCG tablet Take 1 tablet (100 mcg total) by mouth as directed. 1 tablet Monday through Saturdays   levothyroxine (SYNTHROID) 112 MCG tablet Take 1 tablet (112 mcg total) by mouth as directed. 1 tablet on Sundays   meloxicam (MOBIC) 15 MG tablet Take 15 mg by mouth as needed.   methocarbamol (ROBAXIN) 500 MG tablet Take 500 mg by mouth as needed.   olmesartan (BENICAR) 20  MG tablet TAKE 1 TABLET BY MOUTH TWICE A DAY FOR BLOOD PRESSURE   pantoprazole (PROTONIX) 40 MG tablet TAKE 1 TABLET BY MOUTH ONCE A DAY    Allergies: Bactrim [sulfamethoxazole-trimethoprim], Macrobid [nitrofurantoin macrocrystal], Tizanidine, Vicodin [hydrocodone-acetaminophen], Trileptal [oxcarbazepine], Acetaminophen, Hydrocodone, Nexium [esomeprazole magnesium], Nortriptyline, Prilosec [omeprazole], Sulfa antibiotics, Tamoxifen, Trimethoprim, Wasp venom, Erythromycin, Lamotrigine, and Molds & smuts  Social History   Tobacco Use   Smoking status: Never   Smokeless tobacco: Never  Vaping Use   Vaping Use: Never used  Substance Use Topics   Alcohol use: Yes    Alcohol/week: 3.0 standard drinks of alcohol    Types: 2 Cans of beer, 1 Shots of liquor per week   Drug use: No    Family History  Problem Relation Age of Onset   Hypertension Mother    GER disease Mother    Polymyalgia rheumatica Mother    COPD Mother    Osteoarthritis Mother    Osteoporosis Mother    Other Mother        Giant Cell Arteritis  GER disease Father    Heart attack Father    Spondylolysis Father     Review of Systems: Ms. Marlyn Corporal notes intermittent swelling on the balls of both feet, which is also painful.  She has been told that this is neuropathic in nature.  --------------------------------------------------------------------------------------------------  Physical Exam: BP 126/72 (BP Location: Right Arm, Patient Position: Sitting, Cuff Size: Normal)   Pulse 86   Ht '5\' 3"'$  (1.6 m)   Wt 134 lb 12.8 oz (61.1 kg)   SpO2 98%   BMI 23.88 kg/m   General:  NAD. HEENT: No conjunctival pallor or scleral icterus. Neck: Supple without lymphadenopathy, thyromegaly, JVD, or HJR. No carotid bruit. Lungs: Normal work of breathing. Clear to auscultation bilaterally without wheezes or crackles. Heart: Regular rate and rhythm without murmurs.  Early systolic click appreciated. Abd: Bowel sounds present.  Soft, NT/ND without hepatosplenomegaly Ext: No lower extremity edema. Radial, PT, and DP pulses are 2+ bilaterally Skin: Bruising of left shin. Psych: Tangential.  EKG:  Normal sinus rhythm with possible left atrial enlargement.  Otherwise, no significant abnormality.  Lab Results  Component Value Date   WBC 6.8 04/13/2022   HGB 11.8 (L) 04/13/2022   HCT 34.6 (L) 04/13/2022   MCV 90.6 04/13/2022   PLT 207 04/13/2022    Lab Results  Component Value Date   NA 136 04/13/2022   K 4.3 04/13/2022   CL 105 04/13/2022   CO2 24 04/13/2022   BUN 16 04/13/2022   CREATININE 1.21 (H) 04/13/2022   GLUCOSE 95 04/13/2022   ALT 12 04/13/2022    Lab Results  Component Value Date   CHOL 197 12/12/2021   HDL 76.00 12/12/2021   LDLCALC 94 12/12/2021   TRIG 136.0 12/12/2021   CHOLHDL 3 12/12/2021    --------------------------------------------------------------------------------------------------  ASSESSMENT AND PLAN: Chest pain and shortness of breath: The symptoms have been present for years and led to cardiology evaluation as recently as 2019 in [facility].  Ms. Zirkelbach also reports a preceding catheterization though I see no mention of this in Dr. Deatra Robinson note from 2019.  Cardiac risk factors are her age and history of hypertension.  Recent labs were unrevealing other than very mild anemia.  Exam today is notable for a systolic click but no murmurs.  EKG shows possible left atrial enlargement but no other abnormalities.  We have agreed to obtain a coronary CTA and echocardiogram for further evaluation.  If the studies are unremarkable, I would favor deferring additional cardiac workup.  Hypertension: Blood pressure well-controlled today.  Continue current regimen of amlodipine and olmesartan.  Left arm paresthesias: Ms. Voigt attributes this to remote left upper extremity DVT.  Examination today is unrevealing.  Patient has been evaluated by multiple providers including cardiology and  neurology in the past.  Ms. Calico is concerned about the potential for some sort of central venous issue.  Coronary CTA should allow [place] to see if there is any sign of SVC syndrome.  Defer additional workup pending CTA.  Follow-up: Return to clinic after echo and CTA.  [name], MD 05/30/2022 10:14 AM

## 2022-05-30 ENCOUNTER — Other Ambulatory Visit: Payer: Self-pay

## 2022-05-30 ENCOUNTER — Encounter: Payer: Self-pay | Admitting: Internal Medicine

## 2022-05-30 ENCOUNTER — Ambulatory Visit: Payer: 59 | Attending: Internal Medicine | Admitting: Internal Medicine

## 2022-05-30 ENCOUNTER — Other Ambulatory Visit
Admission: RE | Admit: 2022-05-30 | Discharge: 2022-05-30 | Disposition: A | Discharge status: Home / Self Care | Payer: 59 | Source: Ambulatory Visit | Attending: Internal Medicine | Admitting: Internal Medicine

## 2022-05-30 VITALS — BP 126/72 | HR 86 | Ht 63.0 in | Wt 134.8 lb

## 2022-05-30 DIAGNOSIS — Z01812 Encounter for preprocedural laboratory examination: Secondary | ICD-10-CM

## 2022-05-30 DIAGNOSIS — R0602 Shortness of breath: Secondary | ICD-10-CM

## 2022-05-30 DIAGNOSIS — I10 Essential (primary) hypertension: Secondary | ICD-10-CM

## 2022-05-30 DIAGNOSIS — R072 Precordial pain: Secondary | ICD-10-CM | POA: Diagnosis not present

## 2022-05-30 DIAGNOSIS — R202 Paresthesia of skin: Secondary | ICD-10-CM | POA: Diagnosis not present

## 2022-05-30 DIAGNOSIS — Z79899 Other long term (current) drug therapy: Secondary | ICD-10-CM

## 2022-05-30 LAB — BASIC METABOLIC PANEL
Anion gap: 7 (ref 5–15)
BUN: 43 mg/dL — ABNORMAL HIGH (ref 8–23)
CO2: 23 mmol/L (ref 22–32)
Calcium: 9 mg/dL (ref 8.9–10.3)
Chloride: 105 mmol/L (ref 98–111)
Creatinine, Ser: 1.78 mg/dL — ABNORMAL HIGH (ref 0.44–1.00)
GFR, Estimated: 31 mL/min — ABNORMAL LOW (ref >60)
Glucose, Bld: 83 mg/dL (ref 70–99)
Potassium: 3.9 mmol/L (ref 3.5–5.1)
Sodium: 135 mmol/L (ref 135–145)

## 2022-05-30 MED ORDER — METOPROLOL TARTRATE 100 MG PO TABS: ORAL_TABLET | ORAL | 0 refills | Status: DC

## 2022-05-30 NOTE — Addendum Note (Signed)
Addended by: James Ivanoff D on: 05/30/2022 02:53 PM   Modules accepted: Orders

## 2022-05-30 NOTE — Patient Instructions (Addendum)
Medication Instructions:  Your Physician recommend you continue on your current medication as directed.    *If you need a refill on your cardiac medications before your next appointment, please call your pharmacy*   Lab Work: Your provider would like for you to have following labs drawn: (BMP).   Please go to the [facility] entrance and check in at the front desk.  You do not need an appointment.  They are open from 7am-6 pm.   If you have labs (blood work) drawn today and your tests are completely normal, you will receive your results only by: [place] (if you have MyChart) OR A paper copy in the mail If you have any lab test that is abnormal or we need to change your treatment, we will call you to review the results.   Testing/Procedures: Your physician has requested that you have an echocardiogram. Echocardiography is a painless test that uses sound waves to create images of your heart. It provides your doctor with information about the size and shape of your heart and how well your heart's chambers and valves are working.   You may receive an ultrasound enhancing agent through an IV if needed to better visualize your heart during the echo. This procedure takes approximately one hour.  There are no restrictions for this procedure.  This will take place at [place] [facility] [id number], [place]  Non-Cardiac CT Angiography (CTA), is a special type of CT scan that uses a computer to produce multi-dimensional views of major blood vessels throughout the body. In CT angiography, a contrast material is injected through an IV to help visualize the blood vessels    Follow-Up: At [facility], you and your health needs are our priority.  As part of our continuing mission to provide you with exceptional heart care, we have created designated Provider Care Teams.  These Care Teams include your primary Cardiologist (physician) and Advanced  Practice Providers (APPs -  Physician Assistants and Nurse Practitioners) who all work together to provide you with the care you need, when you need it.  We recommend signing up for the patient portal called "MyChart".  Sign up information is provided on this After Visit Summary.  MyChart is used to connect with patients for Virtual Visits (Telemedicine).  Patients are able to view lab/test results, encounter notes, upcoming appointments, etc.  Non-urgent messages can be sent to your provider as well.   To learn more about what you can do with MyChart, go to [email or web address].    Your next appointment:   1 month(s)  Provider:   You may see [name], MD or one of the following Advanced Practice Providers on your designated Care Team:   Murray Hodgkins, NP Christell Faith, PA-C Cadence Kathlen Mody, PA-C Gerrie Nordmann, NP      Your cardiac CT will be scheduled at one of the below locations:   [facility] [place], [place] [address] [phone]  [facility] [place], [place] [address] [phone]  If scheduled at [facility], please arrive at the [facility] and Children's Entrance (Entrance C2) of [facility] 30 minutes prior to test start time. You can use the FREE valet parking offered at entrance C (encouraged to control the heart rate for the test)  Proceed to the [facility] Radiology Department (first floor) to check-in and test prep.  All radiology  patients and guests should use entrance C2 at [facility], accessed from [facility], even though the hospital's physical address listed is [address].    If scheduled at [facility] or [facility], please arrive 15 mins early for check-in and test prep.   Please follow these instructions carefully (unless otherwise  directed):   On the Night Before the Test: Be sure to Drink plenty of water. Do not consume any caffeinated/decaffeinated beverages or chocolate 12 hours prior to your test. Do not take any antihistamines 12 hours prior to your test.  On the Day of the Test: Drink plenty of water until 1 hour prior to the test. Do not eat any food 1 hour prior to test. You may take your regular medications prior to the test.  Take metoprolol (Lopressor) 100 mg two hours prior to test. HOLD olmesartan and amlodipine morning of the test. FEMALES- please wear underwire-free bra if available, avoid dresses & tight clothing       After the Test: Drink plenty of water. After receiving IV contrast, you may experience a mild flushed feeling. This is normal. On occasion, you may experience a mild rash up to 24 hours after the test. This is not dangerous. If this occurs, you can take Benadryl 25 mg and increase your fluid intake. If you experience trouble breathing, this can be serious. If it is severe call 911 IMMEDIATELY. If it is mild, please call our office. If you take any of these medications: Glipizide/Metformin, Avandament, Glucavance, please do not take 48 hours after completing test unless otherwise instructed.  We will call to schedule your test 2-4 weeks out understanding that some insurance companies will need an authorization prior to the service being performed.   For non-scheduling related questions, please contact the cardiac imaging nurse navigator should you have any questions/concerns: Marchia Bond, Cardiac Imaging Nurse Navigator Gordy Clement, Cardiac Imaging Nurse Navigator [facility] Direct Office Dial: [phone]   For scheduling needs, including cancellations and rescheduling, please call [place], [phone].

## 2022-05-31 ENCOUNTER — Other Ambulatory Visit: Payer: Self-pay | Admitting: Primary Care

## 2022-05-31 DIAGNOSIS — I10 Essential (primary) hypertension: Secondary | ICD-10-CM

## 2022-06-01 ENCOUNTER — Ambulatory Visit (INDEPENDENT_AMBULATORY_CARE_PROVIDER_SITE_OTHER): Payer: 59 | Admitting: Psychiatry

## 2022-06-01 DIAGNOSIS — F410 Panic disorder [episodic paroxysmal anxiety] without agoraphobia: Secondary | ICD-10-CM

## 2022-06-01 DIAGNOSIS — S069XAS Unspecified intracranial injury with loss of consciousness status unknown, sequela: Secondary | ICD-10-CM

## 2022-06-01 DIAGNOSIS — F0630 Mood disorder due to known physiological condition, unspecified: Secondary | ICD-10-CM

## 2022-06-01 NOTE — Progress Notes (Signed)
Virtual Visit via Video Note  I connected with Katie Robbins on 06/01/22 at 11:05 AM EST  by a video enabled telemedicine application and verified that I am speaking with the correct person using two identifiers.  Location: Patient: Home Provider: [place] office    I discussed the limitations of evaluation and management by telemedicine and the availability of in person appointments. The patient expressed understanding and agreed to proceed.  I provided 50 minutes of non-face-to-face time during this encounter.   [name], LCSW   THERAPIST PROGRESS NOTE  Session Time: Friday  06/01/2022 11:05 AM - 11:55 AM   Participation Level: Active  Behavioral Response: Alert/Anxious/Talkative/tangentiality  Type of Therapy: Individual Therapy  Treatment Goals addressed: Reduce worry, panic when interacting with others AEB pt reducing intensity of panic and nervousness from an 8 to 4 per ( 10 pt scale)pt's report Learn and implement relaxation techniques, practice daily      Progress on goals: Initial   Interventions: CBT and Supportive  Summary: Katie Robbins is a 67 y.o. female who initially was referred for services for continuity of care while her previous therapist was on medical leave.  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 She is a returning patient to this clinician and last was seen in July 2023.  Per patient's report, her husband urged her to call as she cannot have a conversation with other people without interrupting.  She reports being irritable and staying manage with her husband for a couple of days if he says the wrong thing.  Patient states being very moody and becoming upset easily over little things.  She also reports crying almost daily.  She reports decreased interest and involvement in activities.  She states not leaving the house or socializing.  Patient states feeling stressed about possibly  saying wrong thing to husband as she fears he will leave her although she states knowing he will not leave.    Patient last was seen via virtual visit about 2 weeks ago.  She states having ups and downs since last session.  She has continued to experience medical issues but is experiencing some relief as she recently had an ablation on her neck.  She reports less stress regarding the couple with whom she felt uncomfortable as she has not seen the couple since last session.  She also reports her husband addressed patient's concerns with a couple.  Patient's husband attended session briefly to report patient has communication issues due to her brain disorder.  Per husband's report, patient frequently interrupts conversation as she does not listen to the other person speaking.  She speaks without receiving all the information as she fears losing her train of thought.  Patient reports becoming anxious due to this when having conversations with others.  She expresses acceptance of brain disorder but states still needing help with coping and managing interpersonal situations.   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 Erling Cruz for medication management  Patient/Guardian was advised Release of Information must be obtained prior to any record release in  order to collaborate their care with an outside provider. Patient/Guardian was advised if they have not already done so to contact the registration department to sign all necessary forms in order for [place] to release information regarding their care.   Consent: Patient/Guardian gives verbal consent for treatment and assignment of benefits for services provided during this visit. Patient/Guardian expressed understanding and agreed to proceed.      [name], LCSW 06/01/2022   Tobin Witucki E Cally Nygard LCSW

## 2022-06-06 ENCOUNTER — Other Ambulatory Visit
Admission: RE | Admit: 2022-06-06 | Discharge: 2022-06-06 | Disposition: A | Discharge status: Home / Self Care | Payer: 59 | Attending: Internal Medicine | Admitting: Internal Medicine

## 2022-06-06 DIAGNOSIS — Z79899 Other long term (current) drug therapy: Secondary | ICD-10-CM | POA: Insufficient documentation

## 2022-06-06 LAB — BASIC METABOLIC PANEL
Anion gap: 7 (ref 5–15)
BUN: 19 mg/dL (ref 8–23)
CO2: 22 mmol/L (ref 22–32)
Calcium: 8.4 mg/dL — ABNORMAL LOW (ref 8.9–10.3)
Chloride: 107 mmol/L (ref 98–111)
Creatinine, Ser: 1.34 mg/dL — ABNORMAL HIGH (ref 0.44–1.00)
GFR, Estimated: 44 mL/min — ABNORMAL LOW (ref >60)
Glucose, Bld: 221 mg/dL — ABNORMAL HIGH (ref 70–99)
Potassium: 3.6 mmol/L (ref 3.5–5.1)
Sodium: 136 mmol/L (ref 135–145)

## 2022-06-12 ENCOUNTER — Telehealth: Payer: Self-pay | Admitting: Internal Medicine

## 2022-06-12 NOTE — Telephone Encounter (Signed)
Pt is returning call in regards to labs. Requesting return call.

## 2022-06-12 NOTE — Telephone Encounter (Signed)
Pt made aware of lab results along with MD's recommendations. Pt verbalized understanding. Repeat lab order placed and message sent to scheduling to schedule Cardiac CTA    Katie Bush, MD 06/09/2022  2:05 PM EST     Please let [name] know that her creatinine has improved and is near but still slightly above her baseline.  Her blood glucose was also elevated, which it appears that [name] has already discussed with her PCP.  I recommend that she continue to drink plenty of fluids and avoid all NSAIDs (including meloxicam that remains on her medication list).  I think it is reasonable to proceed with coronary CTA at her convenience and to repeat a BMP ~1 week after the CTA.

## 2022-06-15 ENCOUNTER — Ambulatory Visit (INDEPENDENT_AMBULATORY_CARE_PROVIDER_SITE_OTHER): Payer: 59 | Admitting: Psychiatry

## 2022-06-15 DIAGNOSIS — S069XAS Unspecified intracranial injury with loss of consciousness status unknown, sequela: Secondary | ICD-10-CM | POA: Diagnosis not present

## 2022-06-15 DIAGNOSIS — F410 Panic disorder [episodic paroxysmal anxiety] without agoraphobia: Secondary | ICD-10-CM

## 2022-06-15 DIAGNOSIS — F0630 Mood disorder due to known physiological condition, unspecified: Secondary | ICD-10-CM

## 2022-06-15 NOTE — Progress Notes (Signed)
Virtual Visit via Video Note  I connected with Katie Robbins on 06/15/22 at 11:10 AM EST by a video enabled telemedicine application and verified that I am speaking with the correct person using two identifiers.  Location: Patient: Home Provider: [place] office    I discussed the limitations of evaluation and management by telemedicine and the availability of in person appointments. The patient expressed understanding and agreed to proceed.    I provided 50 minutes of non-face-to-face time during this encounter.   [name], LCSW    THERAPIST PROGRESS NOTE  Session Time: Friday  06/15/2022 11:10 AM -  12:00 PM   Participation Level: Active  Behavioral Response: Alert/Anxious/Talkative/tangentiality  Type of Therapy: Individual Therapy  Treatment Goals addressed: Reduce worry, panic when interacting with others AEB pt reducing intensity of panic and nervousness from an 8 to 4 per ( 10 pt scale)pt's report Learn and implement relaxation techniques, practice daily      Progress on goals: Initial   Interventions: CBT and Supportive  Summary: Katie Robbins is a 67 y.o. female who initially was referred for services for continuity of care while her previous therapist was on medical leave.  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 She is a returning patient to this clinician and last was seen in July 2023.  Per patient's report, her husband urged her to call as she cannot have a conversation with other people without interrupting.  She reports being irritable and staying manage with her husband for a couple of days if he says the wrong thing.  Patient states being very moody and becoming upset easily over little things.  She also reports crying almost daily.  She reports decreased interest and involvement in activities.  She states not leaving the house or socializing.  Patient states feeling stressed about possibly  saying wrong thing to husband as she fears he will leave her although she states knowing he will not leave.    Patient last was seen via virtual visit about 2 weeks ago.  She reports being pleased her daughter along with her son-in-law and 2 grandchildren are visiting patient and her husband for 2 weeks.  She has enjoyed the visit but reports increased stress and anxiety regarding a recent comment from her daughter about patient being negative.  Daughter also compared patient behavior to the behavior of 1 of patient's former friends who had a TBI.  Patient reports finding herself doing the same comparison and being fearful of becoming like this person.  Patient reports experiencing increased neck pain along with increased thoughts about her condition regarding TBI not becoming better.  Patient reports still struggling with acceptance.   Suicidal/Homicidal: Nowithout intent/plan  Therapist Response: reviewed symptoms, discussed stressors, facilitated expression of thoughts and feelings, validated feelings, assisted patient examine her thought patterns especially regarding comparisons, developed plan with patient to avoid comparing self to other patients who have TBI, assisted patient identify factors contributing to patient's recent interaction with daughter including those involving patient as well as those involving her daughter, did problem solving regarding patient having a conversation with daughter about her condition, also discussed rationale for and assisted patient practice a beach visualization to help cope with anxiety and stress, developed plan with patient to practice beach visualization, checked out interactive audio activity to patient and provided with access code via email  Diagnosis: Axis I: 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 Erling Cruz for  medication management  Patient/Guardian was advised Release of Information must be obtained prior to any record release in order to collaborate their care with an outside provider. Patient/Guardian was advised if they have not already done so to contact the registration department to sign all necessary forms in order for [place] to release information regarding their care.   Consent: Patient/Guardian gives verbal consent for treatment and assignment of benefits for services provided during this visit. Patient/Guardian expressed understanding and agreed to proceed.      [name], LCSW 06/15/2022   Burley Saver Gerrie Castiglia LCSW

## 2022-06-21 ENCOUNTER — Telehealth (HOSPITAL_COMMUNITY): Payer: Self-pay | Admitting: Emergency Medicine

## 2022-06-21 NOTE — Telephone Encounter (Signed)
Reaching out to patient to offer assistance regarding upcoming cardiac imaging study; pt verbalizes understanding of appt date/time, parking situation and where to check in, pre-test NPO status and medications ordered, and verified current allergies; name and call back number provided for further questions should they arise Marchia Bond RN Navigator Cardiac Imaging [facility] [phone] office [phone] cell  Arrival 145 [facility] '100mg'$  metoprolol tartrate  Denies iv issues PO hydration

## 2022-06-25 ENCOUNTER — Ambulatory Visit
Admission: RE | Admit: 2022-06-25 | Discharge: 2022-06-25 | Disposition: A | Discharge status: Home / Self Care | Payer: 59 | Source: Ambulatory Visit | Attending: Internal Medicine | Admitting: Internal Medicine

## 2022-06-25 ENCOUNTER — Other Ambulatory Visit: Payer: 59

## 2022-06-25 DIAGNOSIS — R072 Precordial pain: Secondary | ICD-10-CM | POA: Diagnosis present

## 2022-06-25 DIAGNOSIS — R0602 Shortness of breath: Secondary | ICD-10-CM | POA: Insufficient documentation

## 2022-06-25 MED ORDER — IOHEXOL 350 MG/ML SOLN
75.0000 mL | Freq: Once | INTRAVENOUS | Status: AC | PRN
  Administered 2022-06-25: 75 mL via INTRAVENOUS

## 2022-06-25 MED ORDER — NITROGLYCERIN 0.4 MG SL SUBL
0.8000 mg | SUBLINGUAL_TABLET | Freq: Once | SUBLINGUAL | Status: AC
  Administered 2022-06-25: 0.8 mg via SUBLINGUAL

## 2022-06-25 NOTE — Progress Notes (Signed)
Patient tolerated procedure well. Ambulate w/o difficulty. Denies any lightheadedness or being dizzy. Pt denies any pain at this time. Sitting in chair, pt is encouraged to drink additional water throughout the day and reason explained to patient. Patient verbalized understanding and all questions answered. ABC intact. No further needs at this time. Discharge from procedure area w/o issues.  

## 2022-06-28 ENCOUNTER — Other Ambulatory Visit (INDEPENDENT_AMBULATORY_CARE_PROVIDER_SITE_OTHER): Payer: 59

## 2022-06-28 DIAGNOSIS — E039 Hypothyroidism, unspecified: Secondary | ICD-10-CM

## 2022-06-28 LAB — T4, FREE: Free T4: 0.9 ng/dL (ref 0.60–1.60)

## 2022-06-28 LAB — TSH: TSH: 3.16 u[IU]/mL (ref 0.35–5.50)

## 2022-06-29 ENCOUNTER — Ambulatory Visit (INDEPENDENT_AMBULATORY_CARE_PROVIDER_SITE_OTHER): Payer: 59 | Admitting: Psychiatry

## 2022-06-29 DIAGNOSIS — F0630 Mood disorder due to known physiological condition, unspecified: Secondary | ICD-10-CM | POA: Diagnosis not present

## 2022-06-29 DIAGNOSIS — F410 Panic disorder [episodic paroxysmal anxiety] without agoraphobia: Secondary | ICD-10-CM | POA: Diagnosis not present

## 2022-06-29 DIAGNOSIS — S069XAS Unspecified intracranial injury with loss of consciousness status unknown, sequela: Secondary | ICD-10-CM

## 2022-06-29 NOTE — Progress Notes (Signed)
Virtual Visit via Video Note  I connected with [name] on 06/29/22 at 11:05 AM EST  by a video enabled telemedicine application and verified that I am speaking with the correct person using two identifiers.  Location: Patient: Home Provider: [place] office    I discussed the limitations of evaluation and management by telemedicine and the availability of in person appointments. The patient expressed understanding and agreed to proceed.  I provided 46 minutes of non-face-to-face time during this encounter.   [name], LCSW    THERAPIST PROGRESS NOTE  Session Time: Friday  06/29/2022 11:05 AM  - 11:51 AM   Participation Level: Active  Behavioral Response: Alert/Anxious/Talkative/tangentiality  Type of Therapy: Individual Therapy  Treatment Goals addressed: Reduce worry, panic when interacting with others AEB pt reducing intensity of panic and nervousness from an 8 to 4 per ( 10 pt scale)pt's report Learn and implement relaxation techniques, practice daily      Progress on goals: Progressing    Interventions: CBT and Supportive  Summary: Alynah Brunkhorst is a 67 y.o. female who initially was referred for services for continuity of care while her previous therapist was on medical leave.  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 She is a returning patient to this clinician and last was seen in July 2023.  Per patient's report, her husband urged her to call as she cannot have a conversation with other people without interrupting.  She reports being irritable and staying manage with her husband for a couple of days if he says the wrong thing.  Patient states being very moody and becoming upset easily over little things.  She also reports crying almost daily.  She reports decreased interest and involvement in activities.  She states not leaving the house or socializing.  Patient states feeling stressed about  possibly saying wrong thing to husband as she fears he will leave her although she states knowing he will not leave.    Patient last was seen via virtual visit about 2 weeks ago.  She reports still experiencing stress and anxiety regarding interaction with daughter during the rest of daughter's visit. However, she reports decreased intensity as pt's husband had conversation about pt's condition with pt's daughter per pt's request. She reports a friend came over recently for the afternoon. She observed she was less anxious as she was busy doing an activity. Pt reports having one panic attack since last session and reports this happened when she was at the hair salon. She reports taking a valium to manage.  Patient still practices deep breathing.  She reports having difficulty connecting to the website for the beach visualization activity. Suicidal/Homicidal: Nowithout intent/plan  Therapist Response: reviewed symptoms, as and reinforced patient's initiative to request assistance from husband regarding conversation with daughter, discussed effects, discussed stressors, facilitated expression of thoughts and feelings, validated feelings, praised and reinforced patient's increased involvement in activity and improved structure, assisted patient identify and address difficulty with access [facility] visualization activity, developed plan with patient to practice beach visualization, checked out interactive audio activity to patient and provided with access code via email  Diagnosis: Axis I: 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 Erling Cruz for medication management  Patient/Guardian was advised Release of Information must be obtained prior to any record release in order  to collaborate their care with an outside provider. Patient/Guardian was advised if they have not already done  so to contact the registration department to sign all necessary forms in order for [place] to release information regarding their care.   Consent: Patient/Guardian gives verbal consent for treatment and assignment of benefits for services provided during this visit. Patient/Guardian expressed understanding and agreed to proceed.      [name], LCSW 06/29/2022   Burley Saver Keylani Perlstein LCSW

## 2022-07-06 ENCOUNTER — Ambulatory Visit: Payer: 59 | Attending: Internal Medicine

## 2022-07-06 DIAGNOSIS — R0602 Shortness of breath: Secondary | ICD-10-CM | POA: Diagnosis not present

## 2022-07-06 DIAGNOSIS — R072 Precordial pain: Secondary | ICD-10-CM

## 2022-07-07 ENCOUNTER — Encounter: Payer: Self-pay | Admitting: Internal Medicine

## 2022-07-07 LAB — ECHOCARDIOGRAM COMPLETE
AR max vel: 2.68 cm2
AV Area VTI: 2.42 cm2
AV Area mean vel: 2.42 cm2
AV Mean grad: 2 mmHg
AV Peak grad: 3 mmHg
Ao pk vel: 0.86 m/s
Area-P 1/2: 6.71 cm2
Calc EF: 52.6 %
S' Lateral: 2.8 cm
Single Plane A2C EF: 50.2 %
Single Plane A4C EF: 52.9 %

## 2022-07-10 ENCOUNTER — Telehealth: Payer: Self-pay | Admitting: Emergency Medicine

## 2022-07-10 NOTE — Telephone Encounter (Signed)
Called and spoke w/ pt she is asking if she can start using her Symbicort again. She is having some increased difficulty breathing when exerting (states that she get exhausted when just walking from the kitchen to family room), she states that she isn't able to do normal ADL's for herself. She still has some of the medication left and is wanting RB's approval on restarting the medication.   She also state that she had an ECHO done and there was some stiffening in her mitral valve and is curious if that plus the GBS causing her increased breathing difficulty.   Dr.Byrum please advise.

## 2022-07-10 NOTE — Telephone Encounter (Signed)
Patient would like the nurse to call her regarding her Symbicort medication which the doctor had taken her off.  She would like to go back on the medication because she said she is having problems breathing.  Please advise and call patient to discuss at [phone]

## 2022-07-10 NOTE — Telephone Encounter (Signed)
Spoke with patient discussed recommendations from Dr. Lamonte Sakai. She verbalized understanding. Nothing further needed.

## 2022-07-10 NOTE — Telephone Encounter (Signed)
She is correct that both mitral valve disease and Guillain-Barr syndrome can contribute to shortness of breath. That said, I agree with trying to get back on Symbicort to see if this helps her breathing.  Have her restart 2 puffs twice a day, rinse and gargle after using.

## 2022-07-10 NOTE — Progress Notes (Signed)
Follow-up Outpatient Visit Date: 07/11/2022  Primary Care Provider: Pleas Koch, NP [place] [address]  Chief Complaint: Follow-up chest pain, fatigue, and shortness of breath  HPI:  [name] is a 67 y.o. 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  I met her last month for evaluation of chronic chest pain as well as shortness of breath.  She reported having undergone cardiac catheterization in the past though I was unable to find any records.  We agreed to obtain an echocardiogram and coronary CTA, neither of which showed any significant abnormalities to explain her symptoms.  Today, Katie Robbins reports that she feels about the same as at our last visit.  She has multiple chronic complaints including shortness of breath, chest pain, fatigue, lower extremity neuropathy, dizziness and gait instability.  She initially felt like her symptoms may have gotten a little better with discontinuation of olmesartan, though things are back to how they were previously.  She believes that her elevated blood pressure today is due to worsening headaches for which she received trigger point injections yesterday.  She notes having had a sleep study in the past and was told that she did not have obstructive sleep apnea.  --------------------------------------------------------------------------------------------------  Cardiovascular History & Procedures: Cardiovascular Problems: Chest pain Shortness of breath   Risk Factors: Hypertension and age > 12   Cath/PCI: None available   CV Surgery: None   EP Procedures and Devices: None   Non-Invasive Evaluation(s): Transthoracic  echocardiogram (07/06/2022): Normal LV size and wall thickness.  LVEF 60-65% with normal wall motion.  Grade 1 diastolic dysfunction.  Normal RV size and function.  Normal biatrial size.  No pericardial effusion.  Mild mitral valve calcification and regurgitation.  Normal tricuspid, aortic, and pulmonic valves.  Normal CVP. Coronary CTA (06/25/2022): Normal right dominant coronary arteries without stenosis or calcification.  No significant extracardiac findings identified in the visualized chest. Stress echo (12/24/2017): Poor exercise capacity with submaximal heart rate (84% MPHR).  No wall motion abnormalities identified at rest or during stress.  Recent CV Pertinent Labs: Lab Results  Component Value Date   CHOL 197 12/12/2021   HDL 76.00 12/12/2021   LDLCALC 94 12/12/2021   TRIG 136.0 12/12/2021   CHOLHDL 3 12/12/2021   BNP 25.3 01/14/2018   K 3.6 06/06/2022   BUN 19 06/06/2022   CREATININE 1.34 (H) 06/06/2022   CREATININE 1.04 (H) 11/23/2016    Past medical and surgical history were reviewed and updated in EPIC.  Current Meds  Medication Sig   acetaminophen (TYLENOL) 325 MG tablet Take 325 mg by mouth as needed.   albuterol (VENTOLIN HFA) 108 (90 Base) MCG/ACT inhaler INHALE 2 PUFFS INTO THE LUNGS EVERY 6 HOURS AS NEEDED FOR WHEEZING OR SHORTNESS OF BREATH.   amLODipine (NORVASC) 5 MG tablet TAKE 1 TABLET BY MOUTH ONCE DAILY FOR BLOOD PRESSURE   amphetamine-dextroamphetamine (ADDERALL) 20 MG tablet Take 20 mg by mouth in the morning, at noon, and at bedtime.   aspirin 81 MG EC tablet Take 81 mg by mouth daily.   budesonide-formoterol (SYMBICORT) 160-4.5 MCG/ACT inhaler Inhale 2 puffs into the lungs 2 (two) times daily.   cyanocobalamin (,VITAMIN B-12,) 1000 MCG/ML injection Inject 1 vial into the muscle every 3-4 weeks.  diazepam (VALIUM) 5 MG tablet Take 10 mg by mouth in the morning and at bedtime.   EPINEPHrine 0.3 mg/0.3 mL IJ SOAJ injection Inject 0.3 mLs (0.3 mg total) into  the muscle as needed for anaphylaxis.   estradiol (ESTRACE) 1 MG tablet Take 1 tablet (1 mg total) by mouth daily.   levothyroxine (SYNTHROID) 100 MCG tablet Take 1 tablet (100 mcg total) by mouth as directed. 1 tablet Monday through Saturdays   levothyroxine (SYNTHROID) 112 MCG tablet Take 1 tablet (112 mcg total) by mouth as directed. 1 tablet on Sundays   methocarbamol (ROBAXIN) 500 MG tablet Take 500 mg by mouth as needed.   pantoprazole (PROTONIX) 40 MG tablet TAKE 1 TABLET BY MOUTH ONCE A DAY    Allergies: Bactrim [sulfamethoxazole-trimethoprim], Macrobid [nitrofurantoin macrocrystal], Tizanidine, Vicodin [hydrocodone-acetaminophen], Trileptal [oxcarbazepine], Acetaminophen, Hydrocodone, Nexium [esomeprazole magnesium], Nortriptyline, Prilosec [omeprazole], Sulfa antibiotics, Tamoxifen, Trimethoprim, Wasp venom, Erythromycin, Lamotrigine, and Molds & smuts  Social History   Tobacco Use   Smoking status: Never   Smokeless tobacco: Never  Vaping Use   Vaping Use: Never used  Substance Use Topics   Alcohol use: Yes    Alcohol/week: 3.0 standard drinks of alcohol    Types: 2 Cans of beer, 1 Shots of liquor per week   Drug use: No    Family History  Problem Relation Age of Onset   Hypertension Mother    GER disease Mother    Polymyalgia rheumatica Mother    COPD Mother    Osteoarthritis Mother    Osteoporosis Mother    Other Mother        Giant Cell Arteritis    GER disease Father    Heart attack Father    Spondylolysis Father     Review of Systems: A 12-system review of systems was performed and was negative except as noted in the HPI.  --------------------------------------------------------------------------------------------------  Physical Exam: BP (!) 164/84 (BP Location: Left Arm, Patient Position: Sitting, Cuff Size: Normal)   Pulse 92   Ht '5\' 3"'$  (1.6 m)   Wt 130 lb (59 kg) Comment: per patient  SpO2 99%   BMI 23.03 kg/m  Repeat BP: 158/72  General:   NAD. Neck: No JVD or HJR. Lungs: Clear to auscultation bilaterally without wheezes or crackles. Heart: Regular rate and rhythm without murmurs, rubs, or gallops. Abdomen: Soft, nontender, nondistended. Extremities: No lower extremity edema.   Lab Results  Component Value Date   WBC 6.8 04/13/2022   HGB 11.8 (L) 04/13/2022   HCT 34.6 (L) 04/13/2022   MCV 90.6 04/13/2022   PLT 207 04/13/2022    Lab Results  Component Value Date   NA 136 06/06/2022   K 3.6 06/06/2022   CL 107 06/06/2022   CO2 22 06/06/2022   BUN 19 06/06/2022   CREATININE 1.34 (H) 06/06/2022   GLUCOSE 221 (H) 06/06/2022   ALT 12 04/13/2022    Lab Results  Component Value Date   CHOL 197 12/12/2021   HDL 76.00 12/12/2021   LDLCALC 94 12/12/2021   TRIG 136.0 12/12/2021   CHOLHDL 3 12/12/2021    --------------------------------------------------------------------------------------------------  ASSESSMENT AND PLAN: Chest pain, shortness of breath, dizziness, and fatigue: These have been chronic issues for Katie Robbins.  I recent cardiac workup did not show any abnormalities to explain her symptoms.  She remains concerned that her history of Gamber a syndrome may be playing a role.  I have encouraged her to discuss this further with her PCP, neurologist, and pulmonologist.  I do not recommend any further cardiac testing or intervention at this time.  Hypertension: Blood pressure mildly elevated today, though better on prior checks in January.  Question her headaches are contributing to this.  Of note, olmesartan was also discontinued in mid January due to acute kidney injury.  She notes that her home blood pressures were well-controlled even after stopping olmesartan.  I have asked her to continue monitoring them at home and to let us know if they are consistently above 140/90.  We may need to restart olmesartan or another agent in its place if blood pressures remain consistently high.  Acute kidney  injury: Elevated creatinine of 1.8 noted in January, improved after discontinuation of olmesartan and recommendation to avoid NSAIDs.  Repeat BMP a week later showed improved creatinine of 1.3 (baseline 1.1-1.2).  Continue to hold olmesartan for now, though this may need to be restarted if blood pressure remains high.  NSAIDs should continue to be avoided.  Follow-up: Return to clinic in 3 months for reevaluation of blood pressure with APP.  [name], MD 07/11/2022 11:17 AM

## 2022-07-11 ENCOUNTER — Ambulatory Visit: Payer: 59 | Attending: Internal Medicine | Admitting: Internal Medicine

## 2022-07-11 ENCOUNTER — Encounter: Payer: Self-pay | Admitting: Internal Medicine

## 2022-07-11 VITALS — BP 158/72 | HR 92 | Ht 63.0 in | Wt 130.0 lb

## 2022-07-11 DIAGNOSIS — R5382 Chronic fatigue, unspecified: Secondary | ICD-10-CM

## 2022-07-11 DIAGNOSIS — I10 Essential (primary) hypertension: Secondary | ICD-10-CM

## 2022-07-11 DIAGNOSIS — R0789 Other chest pain: Secondary | ICD-10-CM | POA: Diagnosis not present

## 2022-07-11 DIAGNOSIS — R42 Dizziness and giddiness: Secondary | ICD-10-CM

## 2022-07-11 DIAGNOSIS — R0602 Shortness of breath: Secondary | ICD-10-CM

## 2022-07-11 DIAGNOSIS — N179 Acute kidney failure, unspecified: Secondary | ICD-10-CM

## 2022-07-11 NOTE — Patient Instructions (Addendum)
Medication Instructions:  Your Physician recommend you continue on your current medication as directed.    *If you need a refill on your cardiac medications before your next appointment, please call your pharmacy*   Lab Work: None ordered today   Testing/Procedures: None ordered today   Follow-Up: At [facility], you and your health needs are our priority.  As part of our continuing mission to provide you with exceptional heart care, we have created designated Provider Care Teams.  These Care Teams include your primary Cardiologist (physician) and Advanced Practice Providers (APPs -  Physician Assistants and Nurse Practitioners) who all work together to provide you with the care you need, when you need it.  We recommend signing up for the patient portal called "MyChart".  Sign up information is provided on this After Visit Summary.  MyChart is used to connect with patients for Virtual Visits (Telemedicine).  Patients are able to view lab/test results, encounter notes, upcoming appointments, etc.  Non-urgent messages can be sent to your provider as well.   To learn more about what you can do with MyChart, go to [email or web address].    Your next appointment:   3 month(s)  Provider:   You may see one of the following Advanced Practice Providers on your designated Care Team:   Murray Hodgkins, NP Christell Faith, PA-C Cadence Kathlen Mody, PA-C Gerrie Nordmann, NP

## 2022-07-12 ENCOUNTER — Encounter: Payer: Self-pay | Admitting: Internal Medicine

## 2022-07-12 DIAGNOSIS — N179 Acute kidney failure, unspecified: Secondary | ICD-10-CM | POA: Insufficient documentation

## 2022-07-12 DIAGNOSIS — R42 Dizziness and giddiness: Secondary | ICD-10-CM | POA: Insufficient documentation

## 2022-07-20 ENCOUNTER — Other Ambulatory Visit: Payer: Self-pay | Admitting: Gastroenterology

## 2022-07-20 ENCOUNTER — Telehealth: Payer: Self-pay | Admitting: Primary Care

## 2022-07-20 DIAGNOSIS — K219 Gastro-esophageal reflux disease without esophagitis: Secondary | ICD-10-CM

## 2022-07-20 NOTE — Telephone Encounter (Signed)
Prescription Request  07/20/2022  LOV: 04/17/2022  What is the name of the medication or equipment? pantoprazole (PROTONIX) 40 MG tablet   Have you contacted your pharmacy to request a refill? No   Which pharmacy would you like this sent to?  [facility], [place] [address] Phone: [phone] Fax: [phone]    Patient notified that their request is being sent to the clinical staff for review and that they should receive a response within 2 business days.   Please advise at Mobile [phone] (mobile)

## 2022-07-21 MED ORDER — PANTOPRAZOLE SODIUM 40 MG PO TBEC: 40.0000 mg | DELAYED_RELEASE_TABLET | Freq: Every day | ORAL | 1 refills | Status: DC

## 2022-07-21 NOTE — Telephone Encounter (Signed)
Refills sent to pharmacy. 

## 2022-07-21 NOTE — Addendum Note (Signed)
Addended by: Pleas Koch on: 07/21/2022 03:43 PM   Modules accepted: Orders

## 2022-07-24 ENCOUNTER — Encounter (HOSPITAL_COMMUNITY): Payer: Self-pay

## 2022-07-24 ENCOUNTER — Ambulatory Visit (HOSPITAL_COMMUNITY): Payer: 59 | Admitting: Psychiatry

## 2022-07-30 ENCOUNTER — Other Ambulatory Visit: Payer: Self-pay | Admitting: Primary Care

## 2022-07-30 DIAGNOSIS — I10 Essential (primary) hypertension: Secondary | ICD-10-CM

## 2022-07-31 ENCOUNTER — Telehealth: Payer: Self-pay | Admitting: Internal Medicine

## 2022-07-31 DIAGNOSIS — N183 Chronic kidney disease, stage 3 unspecified: Secondary | ICD-10-CM

## 2022-07-31 MED ORDER — OLMESARTAN MEDOXOMIL 20 MG PO TABS: 20.0000 mg | ORAL_TABLET | Freq: Every day | ORAL | 1 refills | Status: DC

## 2022-07-31 NOTE — Telephone Encounter (Signed)
Left message for pt to call back  °

## 2022-07-31 NOTE — Telephone Encounter (Signed)
  Pt c/o BP issue: STAT if pt c/o blurred vision, one-sided weakness or slurred speech  1. What are your last 5 BP readings?  174/96 165/83  2. Are you having any other symptoms (ex. Dizziness, headache, blurred vision, passed out)? Dizziness, headache, palpitations, SOB   3. What is your BP issue?  Pt said, since Dr. Saunders Revel stopped one of her medication last January she's been experiencing Dizziness, headache, palpitations, SOB an her BP been fluctuating. She has not been feeling good since February, she would like to know if her medications needs to change again

## 2022-07-31 NOTE — Telephone Encounter (Signed)
Pt is returning call.  

## 2022-07-31 NOTE — Telephone Encounter (Signed)
Please let Katie Robbins know that it is unlikely that her symptoms are due from olmesartan withdrawal (withdrawal from olmesartan is not common and would not be expected to last 2 months).  Given that her blood pressures have been running high at home, I think it is reasonable to restart olmesartan 20 mg daily.  We will need to check a BMP in 1-2 weeks to ensure that her renal function and electrolytes remain stable.  She should return back to her previous dose of amlodipine (5 mg daily) and continue monitoring her blood pressure.  [name], MD [facility]

## 2022-07-31 NOTE — Telephone Encounter (Signed)
Spoke w/ pt. She reports that Benicar was d/c'd in January, she felt great for a few days afterwards, but then in Feb, "felt like it hit her with a hammer", started having SOB, chest pain, abdominal pain, palpitations, blurry vision and imbalance. She reports she only wants to sit on the bed or on the couch and doesn't have the energy to do much else. Her BP has been 160-170s/70-90s. She googled her sx and thinks she is having withdrawal from [place], as she was on it for 20 yrs.  She read that increasing amlodipine can help w/ withdrawal, so she increased it from 5 mg daily to 1.5 tabs for the past few days. She has also been taking 3-4 baby aspirin per day and her valium. She has appt w/ neuro on Fri regarding her HAs. She reports that she went back thru her previous med lists and she cannot take enalapril, as it will make her pass out. She would like to know if she should try the increased dosage of Norvasc for a few days or if Dr.  Saunders Revel wants her to resume the Benicar. Advised her that I will make him aware of her concerns and call her back w/ his recommendation.

## 2022-07-31 NOTE — Addendum Note (Signed)
Addended by: Dede Query R on: 07/31/2022 12:34 PM   Modules accepted: Orders

## 2022-07-31 NOTE — Telephone Encounter (Signed)
Spoke w/ pt.  Advised her of Dr. Darnelle Bos recommendation.  She verbalizes understanding and is agreeable.

## 2022-08-03 ENCOUNTER — Other Ambulatory Visit: Payer: Self-pay | Admitting: Internal Medicine

## 2022-08-03 DIAGNOSIS — I10 Essential (primary) hypertension: Secondary | ICD-10-CM

## 2022-08-03 NOTE — Telephone Encounter (Signed)
Please review for refill request, Thank you.

## 2022-08-03 NOTE — Telephone Encounter (Signed)
Refilled Rx in late October 2023 for a 9 month supply. She should have refills at the pharmacy.

## 2022-08-07 ENCOUNTER — Ambulatory Visit (INDEPENDENT_AMBULATORY_CARE_PROVIDER_SITE_OTHER): Payer: 59 | Admitting: Psychiatry

## 2022-08-07 DIAGNOSIS — F0630 Mood disorder due to known physiological condition, unspecified: Secondary | ICD-10-CM

## 2022-08-07 DIAGNOSIS — S069XAS Unspecified intracranial injury with loss of consciousness status unknown, sequela: Secondary | ICD-10-CM | POA: Diagnosis not present

## 2022-08-07 DIAGNOSIS — F410 Panic disorder [episodic paroxysmal anxiety] without agoraphobia: Secondary | ICD-10-CM

## 2022-08-07 NOTE — Progress Notes (Unsigned)
Virtual Visit via Video Note  I connected with [name] on 08/07/22 at  1:00 PM EDT by a video enabled telemedicine application and verified that I am speaking with the correct person using two identifiers.  Location: Patient: Home Provider: [place] office    I discussed the limitations of evaluation and management by telemedicine and the availability of in person appointments. The patient expressed understanding and agreed to proceed.  I provided 50 minutes of non-face-to-face time during this encounter.   [name], LCSW     THERAPIST PROGRESS NOTE  Session Time: Tuesday 08/07/2022 1:10 PM - 2:00 PM    Participation Level: Active  Behavioral Response: Alert/Anxious/Talkative/tangentiality  Type of Therapy: Individual Therapy  Treatment Goals addressed: Reduce worry, panic when interacting with others AEB pt reducing intensity of panic and nervousness from an 8 to 4 per ( 10 pt scale)pt's report Learn and implement relaxation techniques, practice daily      Progress on goals: Progressing    Interventions: CBT and Supportive  Summary: Eloah Cowdrey is a 67 y.o. female who initially was referred for services for continuity of care while her previous therapist was on medical leave.  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 She is a returning patient to this clinician and last was seen in July 2023.  Per patient's report, her husband urged her to call as she cannot have a conversation with other people without interrupting.  She reports being irritable and staying manage with her husband for a couple of days if he says the wrong thing.  Patient states being very moody and becoming upset easily over little things.  She also reports crying almost daily.  She reports decreased interest and involvement in activities.  She states not leaving the house or socializing.  Patient states feeling stressed about  possibly saying wrong thing to husband as she fears he will leave her although she states knowing he will not leave.    Patient last was seen via virtual visit about 5-6 weeks ago.  She reports continued stress and anxiety regarding interaction with daughter.  Per patient's report, they had conflict via text and phone conversation due to miscommunication.  Patient expresses frustration as she suspects daughter still does not understand her condition.  She also expresses hurt as she reports daughter was disrespectful.  She reports they have resumed contact and last conversation was pleasant.  Patient reports little to no social interaction with anyone else except her husband and her medical providers since last session.  Patient reports continued stress regarding health issues but is hopeful about an upcoming surgery to address her severe migraines.  Patient reports she has been practicing beach visualization activity to cope with stress/anxiety and reports this has been helpful.    Suicidal/Homicidal: Nowithout intent/plan  Therapist Response: reviewed symptoms, discussed stressors, facilitated expression of thoughts and feelings, validated feelings, assisted patient examine her interaction with daughter, assisted patient identify realistic expectations of self as well as discussed personal rights, praised and reinforced patient's efforts to practice beach visualization, discussed effects, developed plan with patient to continue practicing relaxation techniques.   Diagnosis: Axis I: 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 Erling Cruz for medication management  Patient/Guardian was advised Release of Information must be obtained prior to any record release in  order to collaborate their care with an outside provider. Patient/Guardian was advised if they have not  already done so to contact the registration department to sign all necessary forms in order for [place] to release information regarding their care.   Consent: Patient/Guardian gives verbal consent for treatment and assignment of benefits for services provided during this visit. Patient/Guardian expressed understanding and agreed to proceed.      [name], LCSW 08/07/2022   Burley Saver Lorissa Kishbaugh LCSW

## 2022-08-09 ENCOUNTER — Other Ambulatory Visit
Admission: RE | Admit: 2022-08-09 | Discharge: 2022-08-09 | Disposition: A | Discharge status: Home / Self Care | Payer: 59 | Source: Ambulatory Visit | Attending: Internal Medicine | Admitting: Internal Medicine

## 2022-08-09 ENCOUNTER — Other Ambulatory Visit: Payer: Self-pay | Admitting: Neurological Surgery

## 2022-08-09 DIAGNOSIS — N183 Chronic kidney disease, stage 3 unspecified: Secondary | ICD-10-CM | POA: Diagnosis not present

## 2022-08-09 LAB — BASIC METABOLIC PANEL
Anion gap: 10 (ref 5–15)
BUN: 28 mg/dL — ABNORMAL HIGH (ref 8–23)
CO2: 23 mmol/L (ref 22–32)
Calcium: 9.1 mg/dL (ref 8.9–10.3)
Chloride: 101 mmol/L (ref 98–111)
Creatinine, Ser: 1.38 mg/dL — ABNORMAL HIGH (ref 0.44–1.00)
GFR, Estimated: 42 mL/min — ABNORMAL LOW (ref >60)
Glucose, Bld: 107 mg/dL — ABNORMAL HIGH (ref 70–99)
Potassium: 4.4 mmol/L (ref 3.5–5.1)
Sodium: 134 mmol/L — ABNORMAL LOW (ref 135–145)

## 2022-08-13 HISTORY — PX: OTHER SURGICAL HISTORY: SHX169

## 2022-08-14 NOTE — Pre-Procedure Instructions (Addendum)
Surgical Instructions    Your procedure is scheduled on August 16, 2022.  Report to [facility] Main Entrance "A" at 8:00 A.M., then check in with the Admitting office.  Call this number if you have problems the morning of surgery:  [phone]  If you have any questions prior to your surgery date call [phone]: Open Monday-Friday 8am-4pm If you experience any cold or flu symptoms such as cough, fever, chills, shortness of breath, etc. between now and your scheduled surgery, please notify us at the above number.     Remember:  Do not eat or drink after midnight the night before your surgery    Take these medicines the morning of surgery with A SIP OF WATER:  amLODipine (NORVASC)   budesonide-formoterol (SYMBICORT) inhaler   levothyroxine (SYNTHROID)   pantoprazole (PROTONIX)    May take these medicines IF NEEDED:  albuterol (VENTOLIN HFA) inhaler   diazepam (VALIUM)   EPINEPHrine Pen   Follow your surgeon's instructions on when to stop Aspirin.  If no instructions were given by your surgeon then you will need to call the office to get those instructions.     As of today, STOP taking any Aleve, Naproxen, Ibuprofen, Motrin, Advil, Goody's, BC's, all herbal medications, fish oil, and all vitamins.                     Do NOT Smoke (Tobacco/Vaping) for 24 hours prior to your procedure.  If you use a CPAP at night, you may bring your mask/headgear for your overnight stay.   Contacts, glasses, piercing's, hearing aid's, dentures or partials may not be worn into surgery, please bring cases for these belongings.    For patients admitted to the hospital, discharge time will be determined by your treatment team.   Patients discharged the day of surgery will not be allowed to drive home, and someone needs to stay with them for 24 hours.  SURGICAL WAITING ROOM VISITATION Patients having surgery or a procedure may have no more than 2 support people in the waiting area - these  visitors may rotate.   Children under the age of 41 must have an adult with them who is not the patient. If the patient needs to stay at the hospital during part of their recovery, the visitor guidelines for inpatient rooms apply. Pre-op nurse will coordinate an appropriate time for 1 support person to accompany patient in pre-op.  This support person may not rotate.   Please refer to the [facility] website for the visitor guidelines for Inpatients (after your surgery is over and you are in a regular room).    Special instructions:   [place]- Preparing For Surgery  Before surgery, you can play an important role. Because skin is not sterile, your skin needs to be as free of germs as possible. You can reduce the number of germs on your skin by washing with CHG (chlorahexidine gluconate) Soap before surgery.  CHG is an antiseptic cleaner which kills germs and bonds with the skin to continue killing germs even after washing.    Oral Hygiene is also important to reduce your risk of infection.  Remember - BRUSH YOUR TEETH THE MORNING OF SURGERY WITH YOUR REGULAR TOOTHPASTE  Please read over the following fact sheets that you were given.    If you received a COVID test during your pre-op visit  it is requested that you wear a mask when out in public, stay away from anyone that may not  be feeling well and notify your surgeon if you develop symptoms. If you have been in contact with anyone that has tested positive in the last 10 days please notify you surgeon.

## 2022-08-15 ENCOUNTER — Encounter (HOSPITAL_COMMUNITY)
Admission: RE | Admit: 2022-08-15 | Discharge: 2022-08-15 | Disposition: A | Discharge status: Home / Self Care | Payer: 59 | Source: Ambulatory Visit | Attending: Neurological Surgery | Admitting: Neurological Surgery

## 2022-08-15 ENCOUNTER — Other Ambulatory Visit: Payer: Self-pay

## 2022-08-15 ENCOUNTER — Encounter (HOSPITAL_COMMUNITY): Payer: Self-pay

## 2022-08-15 VITALS — BP 159/87 | HR 95 | Temp 98.3°F | Resp 16 | Ht 63.0 in | Wt 133.4 lb

## 2022-08-15 DIAGNOSIS — Z01818 Encounter for other preprocedural examination: Secondary | ICD-10-CM

## 2022-08-15 DIAGNOSIS — I2510 Atherosclerotic heart disease of native coronary artery without angina pectoris: Secondary | ICD-10-CM | POA: Diagnosis not present

## 2022-08-15 DIAGNOSIS — Z01812 Encounter for preprocedural laboratory examination: Secondary | ICD-10-CM | POA: Insufficient documentation

## 2022-08-15 HISTORY — DX: Anemia, unspecified: D64.9

## 2022-08-15 HISTORY — DX: Cerebral infarction, unspecified: I63.9

## 2022-08-15 HISTORY — DX: Attention-deficit hyperactivity disorder, unspecified type: F90.9

## 2022-08-15 HISTORY — DX: Chronic kidney disease, unspecified: N18.9

## 2022-08-15 LAB — CBC
HCT: 37.4 % (ref 36.0–46.0)
Hemoglobin: 12.7 g/dL (ref 12.0–15.0)
MCH: 32.2 pg (ref 26.0–34.0)
MCHC: 34 g/dL (ref 30.0–36.0)
MCV: 94.9 fL (ref 80.0–100.0)
Platelets: 301 10*3/uL (ref 150–400)
RBC: 3.94 MIL/uL (ref 3.87–5.11)
RDW: 12.5 % (ref 11.5–15.5)
WBC: 10.2 10*3/uL (ref 4.0–10.5)
nRBC: 0 % (ref 0.0–0.2)

## 2022-08-15 LAB — BASIC METABOLIC PANEL
Anion gap: 11 (ref 5–15)
BUN: 22 mg/dL (ref 8–23)
CO2: 24 mmol/L (ref 22–32)
Calcium: 9.2 mg/dL (ref 8.9–10.3)
Chloride: 98 mmol/L (ref 98–111)
Creatinine, Ser: 1.3 mg/dL — ABNORMAL HIGH (ref 0.44–1.00)
GFR, Estimated: 45 mL/min — ABNORMAL LOW (ref >60)
Glucose, Bld: 105 mg/dL — ABNORMAL HIGH (ref 70–99)
Potassium: 4.9 mmol/L (ref 3.5–5.1)
Sodium: 133 mmol/L — ABNORMAL LOW (ref 135–145)

## 2022-08-15 LAB — SURGICAL PCR SCREEN
MRSA, PCR: NEGATIVE
Staphylococcus aureus: NEGATIVE

## 2022-08-15 NOTE — Progress Notes (Addendum)
PCP - [name], NP Cardiologist - Dr. Harrell Gave End Pulmonologist - Dr. Baltazar Apo  PPM/ICD - Denies Device Orders - n/a Rep Notified - n/a  Chest x-ray - n/a EKG - 05/30/2022 Stress Test - Denies ECHO - 07/06/2022 Cardiac Cath - 2016 by Dr. Laney Potash (?). Dr. Saunders Revel has attempted to find the results of this catheterization, but hasn't been able to find it. Normal per pt. Coronary CT - 06/25/2022  Sleep Study - Denies CPAP - n/a  No DM  Last dose of GLP1 agonist- n/a   GLP1 instructions: n/a  Blood Thinner Instructions: n/a Aspirin Instructions: Last dose of ASA 08/03/22  NPO after midnight  COVID TEST- n/a   Anesthesia review: No.   Patient denies shortness of breath, fever, cough and chest pain at PAT appointment. Pt denies any respiratory illness/infection in the last two months.   All instructions explained to the patient, with a verbal understanding of the material. Patient agrees to go over the instructions while at home for a better understanding. Patient also instructed to self quarantine after being tested for COVID-19. The opportunity to ask questions was provided.

## 2022-08-16 ENCOUNTER — Other Ambulatory Visit: Payer: Self-pay

## 2022-08-16 ENCOUNTER — Ambulatory Visit: Payer: 59 | Admitting: Emergency Medicine

## 2022-08-16 ENCOUNTER — Ambulatory Visit (HOSPITAL_COMMUNITY)
Admission: RE | Admit: 2022-08-16 | Discharge: 2022-08-16 | Disposition: A | Discharge status: Home / Self Care | Payer: 59 | Attending: Neurological Surgery | Admitting: Neurological Surgery

## 2022-08-16 ENCOUNTER — Encounter (HOSPITAL_COMMUNITY)
Admission: RE | Disposition: A | Discharge status: Home / Self Care | Payer: Self-pay | Source: Home / Self Care | Attending: Neurological Surgery

## 2022-08-16 ENCOUNTER — Encounter (HOSPITAL_COMMUNITY): Payer: Self-pay | Admitting: Neurological Surgery

## 2022-08-16 ENCOUNTER — Ambulatory Visit (HOSPITAL_COMMUNITY): Payer: 59

## 2022-08-16 ENCOUNTER — Ambulatory Visit (HOSPITAL_BASED_OUTPATIENT_CLINIC_OR_DEPARTMENT_OTHER): Payer: 59 | Admitting: Certified Registered Nurse Anesthetist

## 2022-08-16 ENCOUNTER — Inpatient Hospital Stay: Admit: 2022-08-16 | Payer: 59 | Admitting: Neurological Surgery

## 2022-08-16 ENCOUNTER — Ambulatory Visit (HOSPITAL_COMMUNITY): Payer: 59 | Admitting: Certified Registered Nurse Anesthetist

## 2022-08-16 DIAGNOSIS — M5481 Occipital neuralgia: Secondary | ICD-10-CM

## 2022-08-16 DIAGNOSIS — D631 Anemia in chronic kidney disease: Secondary | ICD-10-CM

## 2022-08-16 DIAGNOSIS — I129 Hypertensive chronic kidney disease with stage 1 through stage 4 chronic kidney disease, or unspecified chronic kidney disease: Secondary | ICD-10-CM

## 2022-08-16 DIAGNOSIS — N1831 Chronic kidney disease, stage 3a: Secondary | ICD-10-CM | POA: Diagnosis not present

## 2022-08-16 DIAGNOSIS — E039 Hypothyroidism, unspecified: Secondary | ICD-10-CM

## 2022-08-16 DIAGNOSIS — Z8673 Personal history of transient ischemic attack (TIA), and cerebral infarction without residual deficits: Secondary | ICD-10-CM | POA: Diagnosis not present

## 2022-08-16 DIAGNOSIS — Z8249 Family history of ischemic heart disease and other diseases of the circulatory system: Secondary | ICD-10-CM | POA: Diagnosis not present

## 2022-08-16 DIAGNOSIS — N189 Chronic kidney disease, unspecified: Secondary | ICD-10-CM

## 2022-08-16 DIAGNOSIS — D638 Anemia in other chronic diseases classified elsewhere: Secondary | ICD-10-CM

## 2022-08-16 HISTORY — PX: POSTERIOR CERVICAL LAMINECTOMY: SHX2248

## 2022-08-16 SURGERY — POSTERIOR CERVICAL LAMINECTOMY
Anesthesia: General | Site: Spine Cervical | Laterality: Bilateral

## 2022-08-16 SURGERY — POSTERIOR CERVICAL LAMINECTOMY
Anesthesia: General | Laterality: Bilateral

## 2022-08-16 MED ORDER — CHLORHEXIDINE GLUCONATE CLOTH 2 % EX PADS: 6.0000 | MEDICATED_PAD | Freq: Once | CUTANEOUS | Status: DC

## 2022-08-16 MED ORDER — PHENYLEPHRINE HCL-NACL 20-0.9 MG/250ML-% IV SOLN
INTRAVENOUS | Status: AC
  Filled 2022-08-16: qty 250

## 2022-08-16 MED ORDER — THROMBIN 5000 UNITS EX SOLR: OROMUCOSAL | Status: DC | PRN

## 2022-08-16 MED ORDER — ONDANSETRON HCL 4 MG/2ML IJ SOLN
INTRAMUSCULAR | Status: DC | PRN
  Administered 2022-08-16: 4 mg via INTRAVENOUS

## 2022-08-16 MED ORDER — KETAMINE HCL 50 MG/5ML IJ SOSY
PREFILLED_SYRINGE | INTRAMUSCULAR | Status: AC
  Filled 2022-08-16: qty 5

## 2022-08-16 MED ORDER — 0.9 % SODIUM CHLORIDE (POUR BTL) OPTIME
TOPICAL | Status: DC | PRN
  Administered 2022-08-16: 1000 mL

## 2022-08-16 MED ORDER — PROPOFOL 10 MG/ML IV BOLUS
INTRAVENOUS | Status: DC | PRN
  Administered 2022-08-16: 160 mg via INTRAVENOUS

## 2022-08-16 MED ORDER — DEXAMETHASONE SODIUM PHOSPHATE 10 MG/ML IJ SOLN
INTRAMUSCULAR | Status: DC | PRN
  Administered 2022-08-16: 10 mg via INTRAVENOUS

## 2022-08-16 MED ORDER — PROPOFOL 10 MG/ML IV BOLUS
INTRAVENOUS | Status: AC
  Filled 2022-08-16: qty 20

## 2022-08-16 MED ORDER — DEXAMETHASONE SODIUM PHOSPHATE 10 MG/ML IJ SOLN
INTRAMUSCULAR | Status: AC
  Filled 2022-08-16: qty 1

## 2022-08-16 MED ORDER — MIDAZOLAM HCL 2 MG/2ML IJ SOLN
INTRAMUSCULAR | Status: AC
  Filled 2022-08-16: qty 2

## 2022-08-16 MED ORDER — CHLORHEXIDINE GLUCONATE 0.12 % MT SOLN
15.0000 mL | Freq: Once | OROMUCOSAL | Status: AC
  Administered 2022-08-16: 15 mL via OROMUCOSAL
  Filled 2022-08-16: qty 15

## 2022-08-16 MED ORDER — MIDAZOLAM HCL 2 MG/2ML IJ SOLN
INTRAMUSCULAR | Status: DC | PRN
  Administered 2022-08-16: 2 mg via INTRAVENOUS

## 2022-08-16 MED ORDER — ROCURONIUM BROMIDE 10 MG/ML (PF) SYRINGE
PREFILLED_SYRINGE | INTRAVENOUS | Status: AC
  Filled 2022-08-16: qty 10

## 2022-08-16 MED ORDER — PHENYLEPHRINE HCL-NACL 20-0.9 MG/250ML-% IV SOLN
INTRAVENOUS | Status: DC | PRN
  Administered 2022-08-16: 50 ug/min via INTRAVENOUS

## 2022-08-16 MED ORDER — LACTATED RINGERS IV SOLN: INTRAVENOUS | Status: DC

## 2022-08-16 MED ORDER — SUGAMMADEX SODIUM 200 MG/2ML IV SOLN
INTRAVENOUS | Status: DC | PRN
  Administered 2022-08-16: 120 mg via INTRAVENOUS

## 2022-08-16 MED ORDER — ORAL CARE MOUTH RINSE: 15.0000 mL | Freq: Once | OROMUCOSAL | Status: AC

## 2022-08-16 MED ORDER — LIDOCAINE 2% (20 MG/ML) 5 ML SYRINGE
INTRAMUSCULAR | Status: DC | PRN
  Administered 2022-08-16: 60 mg via INTRAVENOUS

## 2022-08-16 MED ORDER — FENTANYL CITRATE (PF) 250 MCG/5ML IJ SOLN
INTRAMUSCULAR | Status: DC | PRN
  Administered 2022-08-16 (×3): 50 ug via INTRAVENOUS
  Administered 2022-08-16: 100 ug via INTRAVENOUS

## 2022-08-16 MED ORDER — ONDANSETRON HCL 4 MG/2ML IJ SOLN
INTRAMUSCULAR | Status: AC
  Filled 2022-08-16: qty 2

## 2022-08-16 MED ORDER — ROCURONIUM BROMIDE 10 MG/ML (PF) SYRINGE
PREFILLED_SYRINGE | INTRAVENOUS | Status: DC | PRN
  Administered 2022-08-16: 20 mg via INTRAVENOUS
  Administered 2022-08-16: 50 mg via INTRAVENOUS

## 2022-08-16 MED ORDER — CEFAZOLIN SODIUM-DEXTROSE 2-4 GM/100ML-% IV SOLN
2.0000 g | INTRAVENOUS | Status: AC
  Administered 2022-08-16: 2 g via INTRAVENOUS
  Filled 2022-08-16: qty 100

## 2022-08-16 MED ORDER — AMISULPRIDE (ANTIEMETIC) 5 MG/2ML IV SOLN: 10.0000 mg | Freq: Once | INTRAVENOUS | Status: DC | PRN

## 2022-08-16 MED ORDER — PROPOFOL 1000 MG/100ML IV EMUL
INTRAVENOUS | Status: AC
  Filled 2022-08-16: qty 200

## 2022-08-16 MED ORDER — FENTANYL CITRATE (PF) 100 MCG/2ML IJ SOLN: 25.0000 ug | INTRAMUSCULAR | Status: DC | PRN

## 2022-08-16 MED ORDER — KETAMINE HCL 10 MG/ML IJ SOLN
INTRAMUSCULAR | Status: DC | PRN
  Administered 2022-08-16: 30 mg via INTRAVENOUS

## 2022-08-16 MED ORDER — THROMBIN 5000 UNITS EX SOLR
CUTANEOUS | Status: AC
  Filled 2022-08-16: qty 5000

## 2022-08-16 MED ORDER — EPHEDRINE 5 MG/ML INJ
INTRAVENOUS | Status: AC
  Filled 2022-08-16: qty 5

## 2022-08-16 MED ORDER — FENTANYL CITRATE (PF) 250 MCG/5ML IJ SOLN
INTRAMUSCULAR | Status: AC
  Filled 2022-08-16: qty 5

## 2022-08-16 MED ORDER — LIDOCAINE-EPINEPHRINE 1 %-1:100000 IJ SOLN
INTRAMUSCULAR | Status: DC | PRN
  Administered 2022-08-16: 10 mL

## 2022-08-16 MED ORDER — OXYCODONE HCL 5 MG PO CAPS: 5.0000 mg | ORAL_CAPSULE | ORAL | 0 refills | Status: DC | PRN

## 2022-08-16 MED ORDER — LIDOCAINE 2% (20 MG/ML) 5 ML SYRINGE
INTRAMUSCULAR | Status: AC
  Filled 2022-08-16: qty 5

## 2022-08-16 MED ORDER — LIDOCAINE-EPINEPHRINE 1 %-1:100000 IJ SOLN
INTRAMUSCULAR | Status: AC
  Filled 2022-08-16: qty 1

## 2022-08-16 MED ORDER — METHYLPREDNISOLONE ACETATE 80 MG/ML IJ SUSP
INTRAMUSCULAR | Status: AC
  Filled 2022-08-16: qty 1

## 2022-08-16 MED ORDER — ASPIRIN 81 MG PO TBEC: 81.0000 mg | DELAYED_RELEASE_TABLET | Freq: Every morning | ORAL | 12 refills | Status: AC

## 2022-08-16 MED ORDER — PHENYLEPHRINE 80 MCG/ML (10ML) SYRINGE FOR IV PUSH (FOR BLOOD PRESSURE SUPPORT)
PREFILLED_SYRINGE | INTRAVENOUS | Status: AC
  Filled 2022-08-16: qty 10

## 2022-08-16 SURGICAL SUPPLY — 60 items
ADH SKN CLS APL DERMABOND .7 (GAUZE/BANDAGES/DRESSINGS) ×1
BAG COUNTER SPONGE SURGICOUNT (BAG) ×1 IMPLANT
BAG SPNG CNTER NS LX DISP (BAG) ×1
BAND INSRT 18 STRL LF DISP RB (MISCELLANEOUS) ×2
BAND RUBBER #18 3X1/16 STRL (MISCELLANEOUS) ×2 IMPLANT
BLADE CLIPPER SURG (BLADE) IMPLANT
BLADE SURG 11 STRL SS (BLADE) ×1 IMPLANT
BUR MATCHSTICK NEURO 3.0 LAGG (BURR) IMPLANT
BUR PRECISION FLUTE 5.0 (BURR) IMPLANT
CANISTER SUCT 3000ML PPV (MISCELLANEOUS) ×1 IMPLANT
DERMABOND ADVANCED .7 DNX12 (GAUZE/BANDAGES/DRESSINGS) ×1 IMPLANT
DRAPE C-ARM 42X72 X-RAY (DRAPES) ×2 IMPLANT
DRAPE LAPAROTOMY 100X72 PEDS (DRAPES) ×1 IMPLANT
DRAPE MICROSCOPE LEICA (MISCELLANEOUS) IMPLANT
DRAPE MICROSCOPE SLANT 54X150 (MISCELLANEOUS) ×1 IMPLANT
DRAPE SURG 17X23 STRL (DRAPES) ×1 IMPLANT
DURAPREP 26ML APPLICATOR (WOUND CARE) ×1 IMPLANT
ELECT BLADE 6.5 EXT (BLADE) ×1 IMPLANT
ELECT REM PT RETURN 9FT ADLT (ELECTROSURGICAL) ×1
ELECTRODE REM PT RTRN 9FT ADLT (ELECTROSURGICAL) ×1 IMPLANT
GAUZE 4X4 16PLY ~~LOC~~+RFID DBL (SPONGE) IMPLANT
GAUZE SPONGE 4X4 12PLY STRL (GAUZE/BANDAGES/DRESSINGS) IMPLANT
GLOVE BIOGEL PI IND STRL 7.5 (GLOVE) ×1 IMPLANT
GLOVE ECLIPSE 7.5 STRL STRAW (GLOVE) ×1 IMPLANT
GLOVE EXAM NITRILE LRG STRL (GLOVE) IMPLANT
GLOVE EXAM NITRILE XL STR (GLOVE) IMPLANT
GLOVE EXAM NITRILE XS STR PU (GLOVE) IMPLANT
GOWN STRL REUS W/ TWL LRG LVL3 (GOWN DISPOSABLE) ×2 IMPLANT
GOWN STRL REUS W/ TWL XL LVL3 (GOWN DISPOSABLE) IMPLANT
GOWN STRL REUS W/TWL 2XL LVL3 (GOWN DISPOSABLE) IMPLANT
GOWN STRL REUS W/TWL LRG LVL3 (GOWN DISPOSABLE) ×2
GOWN STRL REUS W/TWL XL LVL3 (GOWN DISPOSABLE)
HEMOSTAT POWDER KIT SURGIFOAM (HEMOSTASIS) ×1 IMPLANT
KIT BASIN OR (CUSTOM PROCEDURE TRAY) ×1 IMPLANT
KIT TURNOVER KIT B (KITS) ×1 IMPLANT
NDL HYPO 18GX1.5 BLUNT FILL (NEEDLE) IMPLANT
NDL SPNL 18GX3.5 QUINCKE PK (NEEDLE) IMPLANT
NEEDLE HYPO 18GX1.5 BLUNT FILL (NEEDLE) IMPLANT
NEEDLE HYPO 22GX1.5 SAFETY (NEEDLE) ×1 IMPLANT
NEEDLE SPNL 18GX3.5 QUINCKE PK (NEEDLE) IMPLANT
NS IRRIG 1000ML POUR BTL (IV SOLUTION) ×1 IMPLANT
PACK LAMINECTOMY NEURO (CUSTOM PROCEDURE TRAY) ×1 IMPLANT
PAD ARMBOARD 7.5X6 YLW CONV (MISCELLANEOUS) ×3 IMPLANT
SOL ELECTROSURG ANTI STICK (MISCELLANEOUS)
SOLUTION ELECTROSURG ANTI STCK (MISCELLANEOUS) ×1 IMPLANT
SPIKE FLUID TRANSFER (MISCELLANEOUS) ×1 IMPLANT
SPONGE T-LAP 4X18 ~~LOC~~+RFID (SPONGE) IMPLANT
SUT MNCRL AB 3-0 PS2 18 (SUTURE) IMPLANT
SUT MON AB 3-0 SH 27 (SUTURE) ×1
SUT MON AB 3-0 SH27 (SUTURE) ×1 IMPLANT
SUT SILK 2 0 TIES 10X30 (SUTURE) IMPLANT
SUT VIC AB 0 CT1 18XCR BRD8 (SUTURE) ×1 IMPLANT
SUT VIC AB 0 CT1 8-18 (SUTURE) ×1
SUT VIC AB 2-0 CP2 18 (SUTURE) IMPLANT
SUT VIC AB 3-0 SH 8-18 (SUTURE) IMPLANT
SUT VIC AB 4-0 RB1 18 (SUTURE) IMPLANT
SYR 3ML LL SCALE MARK (SYRINGE) IMPLANT
TOWEL GREEN STERILE (TOWEL DISPOSABLE) ×1 IMPLANT
TOWEL GREEN STERILE FF (TOWEL DISPOSABLE) ×1 IMPLANT
WATER STERILE IRR 1000ML POUR (IV SOLUTION) ×1 IMPLANT

## 2022-08-16 NOTE — Transfer of Care (Signed)
Immediate Anesthesia Transfer of Care Note  Patient: Katie Robbins  Procedure(s) Performed: Bilateral Cervical Two ganglionectomies (Bilateral: Spine Cervical)  Patient Location: PACU  Anesthesia Type:General  Level of Consciousness: awake and alert   Airway & Oxygen Therapy: Patient Spontanous Breathing and Patient connected to nasal cannula oxygen  Post-op Assessment: Report given to RN and Post -op Vital signs reviewed and stable  Post vital signs: Reviewed and stable  Last Vitals:  Vitals Value Taken Time  BP 152/62 08/16/22 1300  Temp 36.7 C 08/16/22 1258  Pulse 89 08/16/22 1304  Resp 21 08/16/22 1304  SpO2 100 % 08/16/22 1304  Vitals shown include unvalidated device data.  Last Pain:  Vitals:   08/16/22 0814  TempSrc: Oral         Complications: No notable events documented.

## 2022-08-16 NOTE — H&P (Signed)
Surgical H&P Update  HPI: 67 y.o. with a history of bilateral occipital neuralgia, medically refractory. No changes in health since they were last seen. Still having bilateral occipital pain and wishes to proceed with surgery.  PMHx:  Past Medical History:  Diagnosis Date   ADHD (attention deficit hyperactivity disorder)    Anemia    Abnormal Uterine Bleeding - Resulted in Hysterectomy   Anxiety    Arthritis    Asthma    Chicken pox    Chronic headaches    Chronic kidney disease    CKD Stage 3a. No HD   Diverticulitis    Essential hypertension    GERD (gastroesophageal reflux disease)    Guillain Barr syndrome 1982   H/O breast implant    Heart murmur    Hypertension    Hypothyroidism    Interstitial cystitis    Interstitial cystitis    Mitral prolapse 1985   Nutcracker esophagus    Osteoarthritis    Stroke    Multiple TIAs. Last one Feb. 2024   FamHx:  Family History  Problem Relation Age of Onset   Hypertension Mother    GER disease Mother    Polymyalgia rheumatica Mother    COPD Mother    Osteoarthritis Mother    Osteoporosis Mother    Other Mother        Giant Cell Arteritis    GER disease Father    Heart attack Father    Spondylolysis Father    SocHx:  reports that she has never smoked. She has never used smokeless tobacco. She reports current alcohol use of about 3.0 standard drinks of alcohol per week. She reports that she does not use drugs.  Physical Exam: Strength 5/5 x4 and SILTx4, +b/l gr occipital dysesthesias  Assesment/Plan: 67 y.o. woman with b/l medically refractory ON, here for b/l C2 ganglionecotmies. Risks, benefits, and alternatives discussed and the patient would like to continue with surgery.  -OR today -home from PACU post-op  Judith Part, MD 08/16/22 10:27 AM

## 2022-08-16 NOTE — Op Note (Signed)
PATIENT: Katie Robbins  DAY OF SURGERY: 08/16/22   PRE-OPERATIVE DIAGNOSIS:  Bilateral occipital neuralgia   POST-OPERATIVE DIAGNOSIS:  Same   PROCEDURE:  Bilateral C2 ganglionectomies   SURGEON:  Surgeon(s) and Role:    Judith Part, MD    Norm Parcel PA   ANESTHESIA: ETGA   BRIEF HISTORY: This is a 67 year old woman who presented with medically refractory bilateral occipital neuralgia. I therefore recommended a bilateral C2 ganglionectomy. This was discussed with the patient as well as risks, benefits, and alternatives and wished to proceed with surgery.   OPERATIVE DETAIL: The patient was taken to the operating room, anesthesia was induced by the anesthesia team, the [place] head holder was applied, and the patient was placed on the OR table in the prone position. A formal time out was performed with two patient identifiers and confirmed the operative site.   Fluoroscopy was used to localize the C1-2 interspace and the incision was marked at that level in the midline. The area was then prepped and draped in a sterile fashion. A linear incision was placed at C1-2 and subperiosteal dissection was performed bilaterally. The perivertebral plexus was dissected off of the C2 root and ganglion with a combination of hemostatic techniques. The C2 ganglion was identified and divided proximal and distal to the ganglion to resect the ganglion en bloc. Interestingly, on the left the C2 root was split by the vertebral artery, creating a neurovascular conflict, thus both branches were ligated separately.  Alli Consentino PA was scrubbed and assisted with the entire procedure which included exposure, ganglionectomy and closure.  The wound was copiously irrigated, all instrument and sponge counts were correct, and the incision was then closed in layers. The patient was then returned to anesthesia for emergence. No apparent complications at the completion of the procedure.   EBL:  161mL    DRAINS: none   SPECIMENS: none   Judith Part, MD

## 2022-08-16 NOTE — Anesthesia Preprocedure Evaluation (Addendum)
Anesthesia Evaluation  Patient identified by MRN, date of birth, ID band Patient awake    Reviewed: Allergy & Precautions, NPO status , Patient's Chart, lab work & pertinent test results  Airway Mallampati: II  TM Distance: >3 FB Neck ROM: Full    Dental no notable dental hx.    Pulmonary asthma    Pulmonary exam normal        Cardiovascular hypertension, Pt. on medications + Valvular Problems/Murmurs MVP  Rhythm:Regular Rate:Normal     Neuro/Psych  Headaches  Anxiety     occipital neuralgia  CVA    GI/Hepatic Neg liver ROS,GERD  Medicated,,  Endo/Other  Hypothyroidism    Renal/GU CRFRenal disease  negative genitourinary   Musculoskeletal  (+) Arthritis , Osteoarthritis,    Abdominal Normal abdominal exam  (+)   Peds  (+) ADHD Hematology  (+) Blood dyscrasia, anemia   Anesthesia Other Findings   Reproductive/Obstetrics                             Anesthesia Physical Anesthesia Plan  ASA: 3  Anesthesia Plan: General   Post-op Pain Management:    Induction: Intravenous  PONV Risk Score and Plan: 3 and Ondansetron, Dexamethasone, Midazolam, Treatment may vary due to age or medical condition, Propofol infusion, TIVA and Amisulpride  Airway Management Planned: Mask and Oral ETT  Additional Equipment: None and ClearSight  Intra-op Plan:   Post-operative Plan: Extubation in OR  Informed Consent: I have reviewed the patients History and Physical, chart, labs and discussed the procedure including the risks, benefits and alternatives for the proposed anesthesia with the patient or authorized representative who has indicated his/her understanding and acceptance.     Dental advisory given  Plan Discussed with: CRNA  Anesthesia Plan Comments:        Anesthesia Quick Evaluation

## 2022-08-16 NOTE — Anesthesia Procedure Notes (Signed)
Procedure Name: Intubation Date/Time: 08/16/2022 10:55 AM  Performed by: Reggie Pile, CRNAPre-anesthesia Checklist: Patient identified, Emergency Drugs available, Suction available and Patient being monitored Patient Re-evaluated:Patient Re-evaluated prior to induction Oxygen Delivery Method: Circle system utilized Preoxygenation: Pre-oxygenation with 100% oxygen Induction Type: IV induction Ventilation: Mask ventilation without difficulty Laryngoscope Size: Glidescope and 3 Grade View: Grade I Tube type: Oral Tube size: 7.0 mm Number of attempts: 1 Airway Equipment and Method: Oral airway and Rigid stylet Placement Confirmation: ETT inserted through vocal cords under direct vision, positive ETCO2 and breath sounds checked- equal and bilateral Secured at: 21 cm Tube secured with: Tape Dental Injury: Teeth and Oropharynx as per pre-operative assessment

## 2022-08-17 ENCOUNTER — Encounter (HOSPITAL_COMMUNITY): Payer: Self-pay | Admitting: Neurological Surgery

## 2022-08-17 DIAGNOSIS — M5010 Cervical disc disorder with radiculopathy, unspecified cervical region: Secondary | ICD-10-CM

## 2022-08-17 NOTE — Anesthesia Postprocedure Evaluation (Signed)
Anesthesia Post Note  Patient: Katie Robbins  Procedure(s) Performed: Bilateral Cervical Two ganglionectomies (Bilateral: Spine Cervical)     Patient location during evaluation: PACU Anesthesia Type: General Level of consciousness: awake and alert Pain management: pain level controlled Vital Signs Assessment: post-procedure vital signs reviewed and stable Respiratory status: spontaneous breathing, nonlabored ventilation, respiratory function stable and patient connected to nasal cannula oxygen Cardiovascular status: blood pressure returned to baseline and stable Postop Assessment: no apparent nausea or vomiting Anesthetic complications: no   No notable events documented.  Last Vitals:  Vitals:   08/16/22 1345 08/16/22 1400  BP: 119/69 122/70  Pulse: 69 71  Resp: 11 12  Temp:  36.7 C  SpO2: 93% 93%    Last Pain:  Vitals:   08/16/22 1400  TempSrc:   PainSc: 0-No pain                 Earl Lites P Edrik Rundle

## 2022-08-29 ENCOUNTER — Ambulatory Visit: Payer: 59 | Admitting: Emergency Medicine

## 2022-08-30 ENCOUNTER — Other Ambulatory Visit: Payer: Self-pay | Admitting: Primary Care

## 2022-08-30 DIAGNOSIS — I10 Essential (primary) hypertension: Secondary | ICD-10-CM

## 2022-09-10 ENCOUNTER — Other Ambulatory Visit: Payer: Self-pay | Admitting: Primary Care

## 2022-09-10 DIAGNOSIS — E538 Deficiency of other specified B group vitamins: Secondary | ICD-10-CM

## 2022-09-25 ENCOUNTER — Ambulatory Visit: Payer: 59 | Admitting: Emergency Medicine

## 2022-09-25 ENCOUNTER — Encounter: Payer: Self-pay | Admitting: Emergency Medicine

## 2022-09-25 VITALS — BP 126/74 | HR 80 | Ht 63.0 in | Wt 128.0 lb

## 2022-09-25 DIAGNOSIS — J4540 Moderate persistent asthma, uncomplicated: Secondary | ICD-10-CM

## 2022-09-25 NOTE — Progress Notes (Signed)
Subjective:    Patient ID: Katie Robbins, female    DOB: 02/14/1956, 67 y.o.   MRN: [id number]  HPI  ROV 02/01/2022 --67 year old woman who follows up today for shortness of breath in the setting of mild intermittent asthma, Guillain-Barr syndrome and suspected component of restrictive disease.  She also has a history of GERD, hypertension, MVP, anxiety, chronic headaches.  She has benefited from Symbicort although her PFT have been reassuring in the past.  We decided to repeat these today. She notes that she is doing well on the symbicort. She has some dyspnea when supine, often related to esophageal spasms. She uses a GI cocktail for esophageal spasms. She uses albuterol when she exerts, a few times a day.   Pulmonary function testing performed today and reviewed by me show normal airflows without a bronchodilator response.  Normal lung volumes, normal diffusion capacity and a normal flow volume loop.  ROV 09/25/2022 --follow-up visit for 67 year old woman with a history of Guillain-Barr syndrome and suspected associated restrictive lung disease, GERD, hypertension, MVP, anxiety.  I have followed her for mild intermittent asthma.  She has reassuring pulmonary function testing..  She has had a clinical benefit from Symbicort.  Today she reports that since I last saw her she had cervical ganglionectomies. Her breathing has been doing well. No cough or wheeze.  She is not taking symbicort on a schedule.    Past Medical History:  Diagnosis Date   ADHD (attention deficit hyperactivity disorder)    Anemia    Abnormal Uterine Bleeding - Resulted in Hysterectomy   Anxiety    Arthritis    Asthma    Chicken pox    Chronic headaches    Chronic kidney disease    CKD Stage 3a. No HD   Diverticulitis    Essential hypertension    GERD (gastroesophageal reflux disease)    Guillain Barr syndrome (HCC) 1982   H/O breast implant    Heart murmur    Hypertension    Hypothyroidism    Interstitial  cystitis    Interstitial cystitis    Mitral prolapse 1985   Nutcracker esophagus    Osteoarthritis    Stroke (HCC)    Multiple TIAs. Last one Feb. 2024     Review of Systems  Constitutional:  Negative for fever and unexpected weight change.  HENT:  Negative for congestion, dental problem, ear pain, nosebleeds, postnasal drip, rhinorrhea, sinus pressure, sneezing, sore throat and trouble swallowing.   Eyes:  Negative for redness and itching.  Respiratory:  Positive for cough, chest tightness and shortness of breath. Negative for wheezing.   Cardiovascular:  Negative for palpitations and leg swelling.  Gastrointestinal:  Negative for nausea and vomiting.  Genitourinary:  Negative for dysuria.  Musculoskeletal:  Negative for joint swelling.  Skin:  Negative for rash.  Neurological:  Negative for headaches.  Hematological:  Does not bruise/bleed easily.  Psychiatric/Behavioral:  Negative for dysphoric mood. The patient is not nervous/anxious.         Objective:   Physical Exam Vitals:   09/25/22 1448  BP: 126/74  Pulse: 80  SpO2: 99%  Weight: 128 lb (58.1 kg)  Height: 5\' 3"  (1.6 m)   Gen: Pleasant, well-nourished, in no distress, scattered affect  ENT: No lesions,  mouth clear,  oropharynx clear, no postnasal drip  Neck: No JVD, no stridor  Lungs: No use of accessory muscles, no wheeze or crackles  Cardiovascular: RRR, heart sounds normal, no murmur or gallops, no peripheral  edema  Musculoskeletal: Sternal pain was not assessed today  Neuro: alert, tangential, Some cognitive impairment  Skin: Warm, no lesions or rash      Assessment & Plan:  Asthma She is asymptomatic.  Unclear to me that she has obstructive lung disease, has been able to come off the Symbicort effectively.  She does still keep it available to use more on an as-needed basis.  I did try to explain to her that it is meant to be a maintenance medication, the rationale for using it as maintenance,  even if it is only for part of the year when she is flaring.  Alternatively we could even try changing her to an ICS alone.  For now we are going to try and stay off Symbicort altogether, use albuterol as needed.  Levy Pupa, MD, PhD 09/25/2022, 3:18 PM [facility] [phone] or if no answer [phone]

## 2022-09-25 NOTE — Assessment & Plan Note (Signed)
She is asymptomatic.  Unclear to me that she has obstructive lung disease, has been able to come off the Symbicort effectively.  She does still keep it available to use more on an as-needed basis.  I did try to explain to her that it is meant to be a maintenance medication, the rationale for using it as maintenance, even if it is only for part of the year when she is flaring.  Alternatively we could even try changing her to an ICS alone.  For now we are going to try and stay off Symbicort altogether, use albuterol as needed.

## 2022-09-25 NOTE — Patient Instructions (Signed)
Keep your albuterol available to use 2 puffs if you needed for shortness of breath, chest tightness, wheezing. We will try to hold off on restarting your Symbicort.  Noticed that you are having more shortness of breath, wheezing, coughing, increased albuterol use then we should consider getting back on your Symbicort on a schedule. We can discuss possible alternative to Symbicort going forward depending on how your breathing is doing Follow with Dr. Delton Coombes in 12 months or sooner if you have any problems.

## 2022-09-26 ENCOUNTER — Ambulatory Visit (INDEPENDENT_AMBULATORY_CARE_PROVIDER_SITE_OTHER): Payer: 59 | Admitting: Psychiatry

## 2022-09-26 DIAGNOSIS — F0630 Mood disorder due to known physiological condition, unspecified: Secondary | ICD-10-CM | POA: Diagnosis not present

## 2022-09-26 DIAGNOSIS — F410 Panic disorder [episodic paroxysmal anxiety] without agoraphobia: Secondary | ICD-10-CM | POA: Diagnosis not present

## 2022-09-26 DIAGNOSIS — S069XAS Unspecified intracranial injury with loss of consciousness status unknown, sequela: Secondary | ICD-10-CM

## 2022-09-26 NOTE — Progress Notes (Signed)
Virtual Visit via Video Note  I connected with Katie Robbins on 09/26/22 at 1:10 PM EDT by a video enabled telemedicine application and verified that I am speaking with the correct person using two identifiers.  Location: Patient: Home Provider: [facility] [place] office    I discussed the limitations of evaluation and management by telemedicine and the availability of in person appointments. The patient expressed understanding and agreed to proceed.  I provided 50 minutes of non-face-to-face time during this encounter.   Adah Salvage, LCSW      THERAPIST PROGRESS NOTE  Session Time: Wednesday  09/26/2022 1:10 PM - 2:00 PM   Participation Level: Active  Behavioral Response: Alert/Anxious/Talkative/tangentiality  Type of Therapy: Individual Therapy  Treatment Goals addressed: Reduce worry, panic when interacting with others AEB pt reducing intensity of panic and nervousness from an 8 to 4 per ( 10 pt scale)pt's report Learn and implement relaxation techniques, practice daily      Progress on goals: Progressing    Interventions: CBT and Supportive  Summary: Katie Robbins is a 67 y.o. female who initially was referred for services for continuity of care while her previous therapist was on medical leave.  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 She is a returning patient to this clinician and last was seen in July 2023.  Per patient's report, her husband urged her to call as she cannot have a conversation with other people without interrupting.  She reports being irritable and staying manage with her husband for a couple of days if he says the wrong thing.  Patient states being very moody and becoming upset easily over little things.  She also reports crying almost daily.  She reports decreased interest and involvement in activities.  She states not leaving the house or socializing.  Patient states feeling stressed about  possibly saying wrong thing to husband as she fears he will leave her although she states knowing he will not leave.    Patient last was seen via virtual visit about 5-6 weeks ago.  She reports decreased stress and anxiety regarding interaction with her daughter.  Per patient's report, she and her daughter have reconciled.  Patient reports using assertiveness skills to express her concerns to daughter about her condition.  Per patient's report, daughter seems to have a better understanding of patient's condition and has been more supportive.  Patient reports increased stress about her health as she recently had surgery on her neck.  She no longer is experiencing the severe headaches but is experiencing neck pain.  As a result, she reports decreased involvement in activity as well as decreased socialization.  However, she reports she has gone out to dinner a couple of times.  Patient continues to use relaxation techniques discussed in treatment.  Suicidal/Homicidal: Nowithout intent/plan  Therapist Response: reviewed symptoms, discussed stressors, facilitated expression of thoughts and feelings, validated feelings, reinforced patient's efforts to have assertive conversation with daughter, discussed effects of use of assertiveness skills on patient's level of anxiety and stress as well as her relationship with her daughter, assisted patient identify coping statements to cope with frustration regarding recovery from surgery, developed plan with patient to continue practicing relaxation techniques   Diagnosis: Axis I: 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 Sandria Manly for medication management  Patient/Guardian was advised  Release of Information must be obtained prior to any record release in order to collaborate their care with an outside provider. Patient/Guardian was  advised if they have not already done so to contact the registration department to sign all necessary forms in order for [place] to release information regarding their care.   Consent: Patient/Guardian gives verbal consent for treatment and assignment of benefits for services provided during this visit. Patient/Guardian expressed understanding and agreed to proceed.      Adah Salvage, LCSW 09/26/2022   Katie Robbins E Reeda Soohoo LCSW

## 2022-09-27 ENCOUNTER — Telehealth: Payer: Self-pay | Admitting: Internal Medicine

## 2022-09-27 NOTE — Telephone Encounter (Signed)
Spoke with patient and she stated that since she has been put back on the olmesartan (BENICAR) 20 MG tablet - Take 1 tablet (20 mg total) by mouth daily. She had been taking 2 daily instead of the 1 and stated that she will run out prior to needing refill. Patient wanted to make sure she can get a refill again when she is able. Informed patient to call pharmacy when she is getting low and they will send [place] a Rx request and we will refill it from there. Patient understood with read back.

## 2022-09-27 NOTE — Telephone Encounter (Signed)
Pt c/o medication issue:  1. Name of Medication: olmesartan (BENICAR) 20 MG tablet   2. How are you currently taking this medication (dosage and times per day)? Take 1 tablet (20 mg total) by mouth daily.   3. Are you having a reaction (difficulty breathing--STAT)? No   4. What is your medication issue? Patient says that she has some questions about medication and is confused and need some clarification

## 2022-10-04 NOTE — Telephone Encounter (Signed)
Unable to reach patient by phone. Patients husbands lab appointment is tomorrow 10/05/22 @ 7:45am. I have placed a urine collection kit up front for him to pick up when he comes.

## 2022-10-04 NOTE — Telephone Encounter (Signed)
Please call patient:  Please notify her that it is okay if she collects a sample at home.  Please provide her with a urine collection kit.  This is a time sensitive message.

## 2022-10-05 ENCOUNTER — Other Ambulatory Visit: Payer: Self-pay | Admitting: Primary Care

## 2022-10-05 DIAGNOSIS — R3915 Urgency of urination: Secondary | ICD-10-CM

## 2022-10-05 LAB — POC URINALSYSI DIPSTICK (AUTOMATED)
Bilirubin, UA: NEGATIVE
Blood, UA: POSITIVE
Glucose, UA: NEGATIVE
Ketones, UA: NEGATIVE
Nitrite, UA: NEGATIVE
Protein, UA: POSITIVE — AB
Spec Grav, UA: 1.025 (ref 1.010–1.025)
Urobilinogen, UA: 0.2 E.U./dL
pH, UA: 6 (ref 5.0–8.0)

## 2022-10-07 ENCOUNTER — Other Ambulatory Visit: Payer: Self-pay | Admitting: Primary Care

## 2022-10-07 DIAGNOSIS — N3001 Acute cystitis with hematuria: Secondary | ICD-10-CM

## 2022-10-07 LAB — URINE CULTURE
MICRO NUMBER:: 15001302
SPECIMEN QUALITY:: ADEQUATE

## 2022-10-07 MED ORDER — AMOXICILLIN-POT CLAVULANATE 875-125 MG PO TABS: 1.0000 | ORAL_TABLET | Freq: Two times a day (BID) | ORAL | 0 refills | Status: DC

## 2022-10-09 ENCOUNTER — Ambulatory Visit: Payer: 59 | Attending: Cardiology | Admitting: Cardiology

## 2022-10-09 ENCOUNTER — Encounter: Payer: Self-pay | Admitting: Cardiology

## 2022-10-09 VITALS — BP 151/82 | HR 76 | Ht 64.0 in | Wt 138.2 lb

## 2022-10-09 DIAGNOSIS — I10 Essential (primary) hypertension: Secondary | ICD-10-CM | POA: Diagnosis not present

## 2022-10-09 DIAGNOSIS — R0602 Shortness of breath: Secondary | ICD-10-CM | POA: Diagnosis not present

## 2022-10-09 DIAGNOSIS — N183 Chronic kidney disease, stage 3 unspecified: Secondary | ICD-10-CM

## 2022-10-09 DIAGNOSIS — R0789 Other chest pain: Secondary | ICD-10-CM | POA: Diagnosis not present

## 2022-10-09 DIAGNOSIS — E039 Hypothyroidism, unspecified: Secondary | ICD-10-CM

## 2022-10-09 MED ORDER — OLMESARTAN MEDOXOMIL 20 MG PO TABS: 20.0000 mg | ORAL_TABLET | Freq: Every day | ORAL | 1 refills | Status: DC

## 2022-10-09 NOTE — Progress Notes (Signed)
Cardiology Office Note:   Date:  10/09/2022  ID:  Katie Robbins, DOB 06/20/1955, MRN [id number]  History of Present Illness:   Katie Robbins is a 67 y.o. female with a past medical history of hypertension, questionable mitral valve prolapse with mild mitral regurgitation, chronic chest pain attributed to nutcracker esophagitis, left upper extremity DVT related to PICC line (approximately 2007), Katie Robbins, hypothyroidism, osteoarthritis, and anxiety, who presents today for follow-up of her chronic chest pain, shortness of breath, and hypertension.  She stated she had previously had a cardiac catheterization in the past but the report was unable to be found.  Echocardiogram completed 07/06/2022 revealed LVEF of 60-65%, with normal wall motion, G1 DD, mild mitral valve calcification and regurgitation, coronary CTA completed 06/25/2022 showed normal right dominant coronary arteries without stenosis or calcification, no significant extracardiac findings identified in the visualized chest, stress echo in 12/2017 showed poor exercise capacity with submaximal heart rate, no wall motion abnormalities identified at rest or during stress.  She was last seen in clinic 07/11/2022 by Dr. Okey Dupre.  At that time her symptoms unchanged since her previous visit.  She believes that her elevated blood pressure was causing worsening headaches for which she had received trigger point injections.  There were no changes made to her medication regimen or any further testing that was ordered.  She was to return in 3 months to have her blood pressure reevaluated.  She returns to clinic today stating that overall she has been doing fairly well.  She is having some pain to her neck today as she is postop approximately 8 weeks from having surgery done on her neck.  She did not take any pain medication prior to her visit today as she was having to drive herself and typically she does not drive but her husband was unable to bring her to her  appointment today.  She denies any chest pain, shortness of breath, palpitations, peripheral edema.  She has done well post surgery with minimal pain.  She denies any recent hospitalizations or visits to the emergency department.  She did note that she had a medication area with her on losartan stating that she had previously been taking 2 tablets prior to moving to the area and when she received her last refilled the bottle recommended she take 1 tablet and she was taking 2.  So she is requesting a refill on that medication today as well.  ROS: 10 point review of systems has been completed and considered negative with exception of what is been listed in the HPI  Studies Reviewed:    EKG: No new tracings were completed today  TTE 07/06/22 1. Left ventricular ejection fraction, by estimation, is 60 to 65%. The  left ventricle has normal function. The left ventricle has no regional  wall motion abnormalities. Left ventricular diastolic parameters are  consistent with Grade I diastolic  dysfunction (impaired relaxation).   2. Right ventricular systolic function is normal. The right ventricular  size is normal. Tricuspid regurgitation signal is inadequate for assessing  PA pressure.   3. The mitral valve is normal in structure. Mild mitral valve  regurgitation. No evidence of mitral stenosis.   4. The aortic valve is tricuspid. Aortic valve regurgitation is not  visualized. No aortic stenosis is present.   5. The inferior vena cava is normal in size with greater than 50%  respiratory variability, suggesting right atrial pressure of 3 mmHg.   Coronary CTA 06/25/22 IMPRESSION: 1. Normal coronary calcium score  of 0. Patient is low risk.   2. Normal coronary origin with right dominance.   3. No evidence of CAD.   4. CAD-RADS 0. Consider non-atherosclerotic causes of chest pain.  Risk Assessment/Calculations:     HYPERTENSION CONTROL Vitals:   10/09/22 1401 10/09/22 1410  BP: (!) 152/83  (!) 151/82    The patient's blood pressure is elevated above target today.  In order to address the patient's elevated BP: Blood pressure will be monitored at home to determine if medication changes need to be made.           Physical Exam:   VS:  BP (!) 151/82 (BP Location: Left Arm, Patient Position: Sitting, Cuff Size: Normal)   Pulse 76   Ht 5\' 4"  (1.626 m)   Wt 138 lb 3.2 oz (62.7 kg)   SpO2 97%   BMI 23.72 kg/m    Wt Readings from Last 3 Encounters:  10/09/22 138 lb 3.2 oz (62.7 kg)  09/25/22 128 lb (58.1 kg)  08/16/22 130 lb (59 kg)     GEN: Well nourished, well developed in no acute distress NECK: No JVD; No carotid bruits CARDIAC: RRR, no murmurs, rubs, gallops RESPIRATORY:  Clear to auscultation without rales, wheezing or rhonchi  ABDOMEN: Soft, non-tender, non-distended EXTREMITIES:  No edema; No deformity   ASSESSMENT AND PLAN:   Hypertension with blood pressure today elevated in the 150s systolic.  She relates this to not having had any of her pain medication prior to coming to her appointment today.  She also had recently been doubling up on her olmesartan dose and is just now restarted taking once daily dosing.  Further review of her chart revealed that she had acute kidney injury in mid January and her olmesartan was discontinued but due to having elevated pressures it was restarted.  She is currently not interested in changing her medication regimen and has expressed several instances as to why her pressure is elevated today.  She is encouraged to continue to monitor her pressures at home is likely to be medication changes made.  CKD stage III with elevated serum creatinine of 1.8 in 1/24 down to 1.30 in 4/24.  She has blood work is scheduled upcoming with her PCP to reevaluate since she is back on olmesartan and had doubled up on her dose.  Hypothyroidism where she is continued on levothyroxine.  This continues to be followed by her endocrinologist.  History of  atypical chest pain and shortness of breath where previous cardiac workup was unrevealing.  She thinks that the symptoms coming and going or related to other issues that she has.  No further ischemic testing has been ordered at this time.  Disposition patient return to clinic to see MD/APP in 3 months or sooner if needed.        Signed, Megha Agnes, NP

## 2022-10-09 NOTE — Patient Instructions (Signed)
Medication Instructions:   Your physician recommends that you continue on your current medications as directed. Please refer to the Current Medication list given to you today.  *If you need a refill on your cardiac medications before your next appointment, please call your pharmacy*   Lab Work:  None Ordered  If you have labs (blood work) drawn today and your tests are completely normal, you will receive your results only by: MyChart Message (if you have MyChart) OR A paper copy in the mail If you have any lab test that is abnormal or we need to change your treatment, we will call you to review the results.   Testing/Procedures:  None Ordered    Follow-Up: At [facility], you and your health needs are our priority.  As part of our continuing mission to provide you with exceptional heart care, we have created designated Provider Care Teams.  These Care Teams include your primary Cardiologist (physician) and Advanced Practice Providers (APPs -  Physician Assistants and Nurse Practitioners) who all work together to provide you with the care you need, when you need it.  We recommend signing up for the patient portal called "MyChart".  Sign up information is provided on this After Visit Summary.  MyChart is used to connect with patients for Virtual Visits (Telemedicine).  Patients are able to view lab/test results, encounter notes, upcoming appointments, etc.  Non-urgent messages can be sent to your provider as well.   To learn more about what you can do with MyChart, go to [email or web address].    Your next appointment:   3 month(s)  Provider:   Yvonne Kendall, MD

## 2022-10-10 ENCOUNTER — Ambulatory Visit (INDEPENDENT_AMBULATORY_CARE_PROVIDER_SITE_OTHER): Payer: 59 | Admitting: Psychiatry

## 2022-10-10 DIAGNOSIS — F410 Panic disorder [episodic paroxysmal anxiety] without agoraphobia: Secondary | ICD-10-CM

## 2022-10-10 DIAGNOSIS — S069XAS Unspecified intracranial injury with loss of consciousness status unknown, sequela: Secondary | ICD-10-CM

## 2022-10-10 DIAGNOSIS — F0630 Mood disorder due to known physiological condition, unspecified: Secondary | ICD-10-CM

## 2022-10-10 NOTE — Progress Notes (Signed)
Virtual Visit via Video Note  I connected with Katie Robbins on 10/10/22 at 2:03 PM EDT  by a video enabled telemedicine application and verified that I am speaking with the correct person using two identifiers.  Location: Patient: Home Provider: [facility] [place] office    I discussed the limitations of evaluation and management by telemedicine and the availability of in person appointments. The patient expressed understanding and agreed to proceed.  I provided 52 minutes of non-face-to-face time during this encounter.   Katie Salvage, LCSW    THERAPIST PROGRESS NOTE  Session Time: Wednesday  10/10/2022 2:03 PM - 2:55 AM   Participation Level: Active  Behavioral Response: Alert/Anxious/Talkative/tangentiality  Type of Therapy: Individual Therapy  Treatment Goals addressed: Reduce worry, panic when interacting with others AEB pt reducing intensity of panic and nervousness from an 8 to 4 per ( 10 pt scale)pt's report Learn and implement relaxation techniques, practice daily      Progress on goals: Progressing    Interventions: CBT and Supportive  Summary: Katie Robbins is a 67 y.o. female who initially was referred for services for continuity of care while her previous therapist was on medical leave.  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 She is a returning patient to this clinician and last was seen in July 2023.  Per patient's report, her husband urged her to call as she cannot have a conversation with other people without interrupting.  She reports being irritable and staying manage with her husband for a couple of days if he says the wrong thing.  Patient states being very moody and becoming upset easily over little things.  She also reports crying almost daily.  She reports decreased interest and involvement in activities.  She states not leaving the house or socializing.  Patient states feeling stressed about  possibly saying wrong thing to husband as she fears he will leave her although she states knowing he will not leave.    Patient last was seen via virtual visit about 2 weeks ago.  She reports decreased nervousness and denies any panic since last session.  However, she reports increased pain as she has begun to have headaches near in the top of her head.  She has talked with her daughter and is scheduled to have an MRI Friday, 10-12-2022.  She suspects she may have had a another mini stroke that may have occurred when she had her recent procedure.  Patient states experiencing dizziness, increased confusion, and increased short-term memory difficulty.  Per patient's report, she sometimes will forget what she has said to her husband 5 minutes after she has said it.  She reports very limited activity in the past 2 weeks but is trying to do what she can and also paces self.  She states trying to remain positive.  She expresses gratitude for support from her husband.  She has been maintaining phone contact with her daughter says they talk almost daily.  She reports no longer experiencing anxiety about interaction with daughter.  She also reports recently having a conversation with her son regarding her condition and states son was understanding.  Patient is very pleased about the conversation.  She still is looking forward to she and her husband eventually moving as they have purchased land to build a home.  Patient reports continuing to use relaxation techniques.   Suicidal/Homicidal: Nowithout intent/plan  Therapist Response: reviewed symptoms, discussed stressors, facilitated expression of thoughts  and feelings, validated feelings, praised and reinforced pt's efforts to have realistic expectations of self and to pace self, praised and reinforced patient's efforts to have assertive conversation with son, discussed effects of use of assertiveness skills on patient's level of anxiety and relationship with son, praised  and reinforced pt's use of helpful coping statements to cope current physical issues, developed plan with patient to continue practicing relaxation techniques   Diagnosis: Axis I: 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 Sandria Manly for medication management  Patient/Guardian was advised Release of Information must be obtained prior to any record release in order to collaborate their care with an outside provider. Patient/Guardian was advised if they have not already done so to contact the registration department to sign all necessary forms in order for [place] to release information regarding their care.   Consent: Patient/Guardian gives verbal consent for treatment and assignment of benefits for services provided during this visit. Patient/Guardian expressed understanding and agreed to proceed.      Katie Salvage, LCSW 10/10/2022   Katie Lights Neveyah Garzon LCSW

## 2022-10-17 ENCOUNTER — Telehealth: Payer: Self-pay | Admitting: Primary Care

## 2022-10-17 DIAGNOSIS — B379 Candidiasis, unspecified: Secondary | ICD-10-CM

## 2022-10-17 MED ORDER — FLUCONAZOLE 150 MG PO TABS
150.0000 mg | ORAL_TABLET | Freq: Once | ORAL | 0 refills | Status: AC
Start: 2022-10-17 — End: 2022-10-17

## 2022-10-17 NOTE — Telephone Encounter (Signed)
Patient has a yeast infection from taking antibiotic for UTI, uti is gone, those symptoms are all gone, but has a yeast nfectio would like diflucan called to   [facility] - [place], [place] - [address] Phone: [phone]  Fax: [phone]

## 2022-10-17 NOTE — Telephone Encounter (Signed)
See patient's mychart message.

## 2022-10-17 NOTE — Telephone Encounter (Signed)
Noted, Rx sent to pharmacy. Will respond via MyChart

## 2022-10-24 ENCOUNTER — Ambulatory Visit (INDEPENDENT_AMBULATORY_CARE_PROVIDER_SITE_OTHER): Payer: 59 | Admitting: Psychiatry

## 2022-10-24 DIAGNOSIS — F0630 Mood disorder due to known physiological condition, unspecified: Secondary | ICD-10-CM | POA: Diagnosis not present

## 2022-10-24 DIAGNOSIS — S069XAS Unspecified intracranial injury with loss of consciousness status unknown, sequela: Secondary | ICD-10-CM

## 2022-10-24 DIAGNOSIS — F410 Panic disorder [episodic paroxysmal anxiety] without agoraphobia: Secondary | ICD-10-CM

## 2022-10-24 NOTE — Progress Notes (Signed)
Virtual Visit via Video Note  I connected with Katie Robbins on 10/24/22 at 2:18 PM EDT  by a video enabled telemedicine application and verified that I am speaking with the correct person using two identifiers.  Location: Patient: Home Provider: [facility] Out[facility] [place] office    I discussed the limitations of evaluation and management by telemedicine and the availability of in person appointments. The patient expressed understanding and agreed to proceed.   I provided 40 minutes of non-face-to-face time during this encounter.   Adah Salvage, LCSW      THERAPIST PROGRESS NOTE  Session Time: Wednesday 10/24/2022 2:18 PM - 2:58 PM   Participation Level: Active  Behavioral Response: Alert/Anxious/Talkative/tangentiality  Type of Therapy: Individual Therapy  Treatment Goals addressed: Reduce worry, panic when interacting with others AEB pt reducing intensity of panic and nervousness from an 8 to 4 per ( 10 pt scale)pt's report Learn and implement relaxation techniques, practice daily      Progress on goals: Progressing    Interventions: CBT and Supportive  Summary: Katie Robbins is a 67 y.o. female who initially was referred for services for continuity of care while her previous therapist was on medical leave.  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 She is a returning patient to this clinician and last was seen in July 2023.  Per patient's report, her husband urged her to call as she cannot have a conversation with other people without interrupting.  She reports being irritable and staying manage with her husband for a couple of days if he says the wrong thing.  Patient states being very moody and becoming upset easily over little things.  She also reports crying almost daily.  She reports decreased interest and involvement in activities.  She states not leaving the house or socializing.  Patient states feeling stressed about  possibly saying wrong thing to husband as she fears he will leave her although she states knowing he will not leave.    Patient last was seen via virtual visit about 2 weeks ago.  She reports decreased nervousness and denies any panic since last session.  However, she reports continued severe neck pain resulting in sleep difficulty, fatigue, and increased irritability.  She expresses frustration and reports frequent crying spells due to the pain.  She will begin taking gabapentin today and is hopeful about this alleviating the pain.  Per her report, she had an MRI yesterday and is trying to remain positive about the outcome.  She has tried to maintain involvement in activities and has pace self.  She has been doing things such as cooking occasionally, taking care of her flowers, doing small craft projects, and walking around in her yard.  She reports little to no interaction with her daughter or son for the past 2 weeks due to the pain.  She has maintained interaction with her husband and reports this has been positive.  Patient reports husband is very supportive.  Patient continues to look forward to processes involved in building their new home.  Suicidal/Homicidal: Nowithout intent/plan  Therapist Response: reviewed symptoms, discussed stressors, facilitated expression of thoughts and feelings, validated feelings, praised and reinforced pt's efforts to have realistic expectations of self and to pace self, praised and reinforced patient's efforts to remain involved in activity, discussed the effects on behavioral activation in coping with pain, encouraged patient to maintain consistent efforts  Diagnosis: Axis I: Mood disorder as late effect of TBI,  Anxiety Disorder due to general medical condition  Collaboration of Care: Other none needed at this session patient continues to see psychiatrist Dr. Sandria Manly for medication management  Patient/Guardian was advised Release of Information must be obtained prior to  any record release in order to collaborate their care with an outside provider. Patient/Guardian was advised if they have not already done so to contact the registration department to sign all necessary forms in order for [place] to release information regarding their care.   Consent: Patient/Guardian gives verbal consent for treatment and assignment of benefits for services provided during this visit. Patient/Guardian expressed understanding and agreed to proceed.      Adah Salvage, LCSW 10/24/2022   Ka Flammer E Dorothyann Mourer LCSW

## 2022-11-06 ENCOUNTER — Ambulatory Visit: Payer: 59 | Admitting: Internal Medicine

## 2022-11-06 ENCOUNTER — Encounter: Payer: Self-pay | Admitting: Internal Medicine

## 2022-11-06 VITALS — BP 132/74 | HR 60 | Ht 64.0 in | Wt 140.2 lb

## 2022-11-06 DIAGNOSIS — E039 Hypothyroidism, unspecified: Secondary | ICD-10-CM

## 2022-11-06 NOTE — Patient Instructions (Signed)

## 2022-11-06 NOTE — Progress Notes (Unsigned)
Name: Katie Robbins  MRN/ DOB: [id number], 1956/03/30    Age/ Sex: 67 y.o., female    PCP: Doreene Nest, NP   Reason for Endocrinology Evaluation: Hypothyroidism     Date of Initial Endocrinology Evaluation: 12/06/2021    HPI: [name] is a 67 y.o. female with a past medical history of HTN, asthma, hypothyroid, neuropathy,Hx Guillain-Barr syndrome (1982). The patient presented for initial endocrinology clinic visit on 12/06/2021  for consultative assistance with her Hypothyroidism.   She has been on levothyroxine 1994   Denies prior exposure to radiation and thyroid sx   Over the years she has had fluctuating TSH ranging from 0.11 uIU/mL in 07/2021 to 7.05 uIU/mL in 08/2019    Mother with PMR , giant cell arteritis and thyroid disease   She has had neck surgery in 2018  SUBJECTIVE:    Today (11/06/22):  Katie Robbins is here for a follow up on Hypothyroidism.  She continues with multiple neurological symptoms, Dx with GBS , follows with neurology  She associates her levothyroxine dose with GBS.  Sees Psychiatry for addrall and valium    She is s/p ganglionectomy's due to bilateral occipital neuralgia, medically refractory 08/2022 with Dr. Maurice Small, she continues with pain   She had a follow-up with cardiology 10/01/2022 with a normal coronary CTA, managing HTN  Denies local neck swelling unless related to weight gain  Denies palpitations  Has chronic  constipation  Continues with hair loss but her nails are improving  Uses pill box  No Biotin    Continue levothyroxine 100 mcg x 6 days a week Monday through Saturday  Continue levothyroxine 112 mcg x 1 days  week Sunday     HISTORY:  Past Medical History:  Past Medical History:  Diagnosis Date   ADHD (attention deficit hyperactivity disorder)    Anemia    Abnormal Uterine Bleeding - Resulted in Hysterectomy   Anxiety    Arthritis    Asthma    Chicken pox    Chronic headaches    Chronic  kidney disease    CKD Stage 3a. No HD   Diverticulitis    Essential hypertension    GERD (gastroesophageal reflux disease)    Guillain Barr syndrome (HCC) 1982   H/O breast implant    Heart murmur    Hypertension    Hypothyroidism    Interstitial cystitis    Interstitial cystitis    Mitral prolapse 1985   Nutcracker esophagus    Osteoarthritis    Stroke (HCC)    Multiple TIAs. Last one Feb. 2024   Past Surgical History:  Past Surgical History:  Procedure Laterality Date   ABDOMINAL HYSTERECTOMY  2000/2009   APPENDECTOMY  2007   AUGMENTATION MAMMAPLASTY     BELPHAROPTOSIS REPAIR Bilateral    Cosmetic. Eye Lift   BOWEL RESECTION  2007   CARDIAC CATHETERIZATION  2016   Clean   CARPAL TUNNEL RELEASE Right    CHOLECYSTECTOMY  2012   COLONOSCOPY WITH PROPOFOL N/A 04/13/2020   Procedure: COLONOSCOPY WITH PROPOFOL;  Surgeon: Toney Reil, MD;  Location: [facility] ENDOSCOPY;  Service: Gastroenterology;  Laterality: N/A;   ESOPHAGOGASTRODUODENOSCOPY (EGD) WITH PROPOFOL N/A 04/13/2020   Procedure: ESOPHAGOGASTRODUODENOSCOPY (EGD) WITH PROPOFOL;  Surgeon: Toney Reil, MD;  Location: [facility] ENDOSCOPY;  Service: Gastroenterology;  Laterality: N/A;   NECK SURGERY  06/11/2016   Fusion C3-C7 Dr. Wynetta Emery   POSTERIOR CERVICAL LAMINECTOMY Bilateral 08/16/2022   Procedure: Bilateral Cervical Two ganglionectomies;  Surgeon:  Jadene Pierini, MD;  Location: [facility] OR;  Service: Neurosurgery;  Laterality: Bilateral;   SMALL INTESTINE SURGERY  2007   Bowel resection   TONSILLECTOMY     VESICO-VAGINAL FISTULA REPAIR  2007    Social History:  reports that she has never smoked. She has never used smokeless tobacco. She reports current alcohol use of about 3.0 standard drinks of alcohol per week. She reports that she does not use drugs. Family History: family history includes COPD in her mother; GER disease in her father and mother; Heart attack in her father; Hypertension in her mother;  Osteoarthritis in her mother; Osteoporosis in her mother; Other in her mother; Polymyalgia rheumatica in her mother; Spondylolysis in her father.   HOME MEDICATIONS: Allergies as of 11/06/2022       Reactions   Bactrim [sulfamethoxazole-trimethoprim] Hives   Macrobid [nitrofurantoin Macrocrystal] Hives   Tizanidine Other (See Comments)   Bad Mood change Migraines   Vicodin [hydrocodone-acetaminophen] Swelling   Puffy eyes and face   Trileptal [oxcarbazepine] Rash   Abilify [aripiprazole] Other (See Comments)   Bad leg cramps   Acetaminophen    Other reaction(s): Puffy face/eyes   Amaryl [glimepiride] Other (See Comments)   Not effective   Ambien [zolpidem] Other (See Comments)   Did not work   Cyclobenzaprine Other (See Comments)   Not effective   Erythromycin Base    Other Reaction(s): Rash   Gabapentin Other (See Comments)   XR makes her Groggy   Hydrocodone Swelling   Eyes and face   Klonopin [clonazepam] Other (See Comments)   Make her feel groggy   Nexium [esomeprazole Magnesium] Nausea And Vomiting   Face swelling, hives    Nortriptyline    insomnia   Olanzapine Other (See Comments)   Not effective   Pregabalin Other (See Comments)   Not effective   Prilosec [omeprazole] Nausea And Vomiting   Face swelling, hives    Quetiapine Other (See Comments)   Groggy   Reglan [metoclopramide] Nausea Only   Savella [milnacipran] Other (See Comments)   Not effective   Sulfa Antibiotics Hives   Tamoxifen    Tapentadol Other (See Comments)   Not effective   Topiramate Other (See Comments)   Not effective   Tramadol Other (See Comments)   Not effective   Trimethoprim    Other reaction(s): Hives   Wasp Venom Hives   swelling   Zoloft [sertraline] Other (See Comments)   Not effective   Erythromycin Rash   Lamotrigine Rash   Bad mood swings   Latex Rash   Molds & Smuts Anxiety   Black Mold        Medication List        Accurate as of November 06, 2022  7:11  AM. If you have any questions, ask your nurse or doctor.          albuterol 108 (90 Base) MCG/ACT inhaler Commonly known as: VENTOLIN HFA INHALE 2 PUFFS INTO THE LUNGS EVERY 6 HOURS AS NEEDED FOR WHEEZING OR SHORTNESS OF BREATH.   amLODipine 5 MG tablet Commonly known as: NORVASC TAKE 1 TABLET BY MOUTH ONCE DAILY FOR BLOOD PRESSURE   amoxicillin-clavulanate 875-125 MG tablet Commonly known as: AUGMENTIN Take 1 tablet by mouth 2 (two) times daily.   amphetamine-dextroamphetamine 20 MG tablet Commonly known as: ADDERALL Take 20 mg by mouth in the morning, at noon, and at bedtime.   aspirin EC 81 MG tablet Take 1 tablet (81 mg total) by  mouth in the morning.   budesonide-formoterol 160-4.5 MCG/ACT inhaler Commonly known as: Symbicort Inhale 2 puffs into the lungs 2 (two) times daily.   cyanocobalamin 1000 MCG/ML injection Commonly known as: VITAMIN B12 INJECT ONE VIAL (1 ML) INTO THE MUSCLE EVERY THREE TO FOUR WEEKS   diazepam 5 MG tablet Commonly known as: VALIUM Take 5 mg by mouth 4 (four) times daily as needed for anxiety.   EPINEPHrine 0.3 mg/0.3 mL Soaj injection Commonly known as: EPI-PEN Inject 0.3 mLs (0.3 mg total) into the muscle as needed for anaphylaxis.   estradiol 1 MG tablet Commonly known as: ESTRACE Take 1 tablet (1 mg total) by mouth daily.   levothyroxine 100 MCG tablet Commonly known as: SYNTHROID Take 1 tablet (100 mcg total) by mouth as directed. 1 tablet Monday through Saturdays   levothyroxine 112 MCG tablet Commonly known as: SYNTHROID Take 1 tablet (112 mcg total) by mouth as directed. 1 tablet on Sundays   meloxicam 15 MG tablet Commonly known as: MOBIC Take 15 mg by mouth as needed.   methocarbamol 500 MG tablet Commonly known as: ROBAXIN Take 500 mg by mouth as needed.   olmesartan 20 MG tablet Commonly known as: BENICAR Take 1 tablet (20 mg total) by mouth daily.   oxycodone 5 MG capsule Commonly known as: OXY-IR Take 5 mg  by mouth every 6 (six) hours as needed.   pantoprazole 40 MG tablet Commonly known as: PROTONIX Take 1 tablet (40 mg total) by mouth daily. for heartburn.   PRESCRIPTION MEDICATION Take 15 mLs by mouth daily as needed (Esophageal Spasm). 450 Gi cocktail  Bentyl          REVIEW OF SYSTEMS: A comprehensive ROS was conducted with the patient and is negative except as per HPI    OBJECTIVE:  VS: There were no vitals taken for this visit.   Wt Readings from Last 3 Encounters:  10/09/22 138 lb 3.2 oz (62.7 kg)  09/25/22 128 lb (58.1 kg)  08/16/22 130 lb (59 kg)     EXAM: General: Pt appears well and is in NAD  Neck: General: Supple without adenopathy. Thyroid: Thyroid size normal.  No goiter or nodules appreciated.  Lungs: Clear with good BS bilat with no rales, rhonchi, or wheezes  Heart: Auscultation: RRR.  Abdomen: Normoactive bowel sounds, soft, nontender, without masses or organomegaly palpable  Extremities:  BL LE: No pretibial edema normal ROM and strength.  Mental Status: Judgment, insight: Intact Orientation: Oriented to time, place, and person Mood and affect: No depression, anxiety, or agitation     DATA REVIEWED:     Latest Reference Range & Units 04/27/22 11:19  TSH 0.35 - 5.50 uIU/mL 0.14 (L)  T4,Free(Direct) 0.60 - 1.60 ng/dL 4.74     ASSESSMENT/PLAN/RECOMMENDATIONS:   Hypothyroidism:  -Clinically she is feeling better -No local neck symptoms -She feels better when her TSH is within mid range -TSH is low, will decrease levothyroxine as below   Medications : Continue levothyroxine 100 mcg x 6 days a week Continue levothyroxine 112 mcg x  days a week (Sunday)   Follow-up in 6 months Labs in 2 months  Signed electronically by: Lyndle Herrlich, MD  [facility]  [facility] [address] [place] [place], [place] [address] Phone: [phone] FAX: [phone]   CC: Doreene Nest, NP [address] [place] [address] Phone: [phone] Fax: [phone]   Return to Endocrinology clinic as below: Future Appointments  Date Time Provider Department Center  11/06/2022 11:10 AM Madiha Bambrick, Konrad Dolores, MD [facility] None  11/14/2022  2:00 PM Adah Salvage, LCSW [facility] None  12/19/2022  1:00 PM Bynum, Ninetta Lights, LCSW [facility] None  01/02/2023  2:00 PM Bynum, Ninetta Lights, LCSW [facility] None  01/16/2023  4:00 PM End, Cristal Deer, MD [facility] None

## 2022-11-08 ENCOUNTER — Telehealth: Payer: Self-pay | Admitting: Internal Medicine

## 2022-11-08 NOTE — Telephone Encounter (Signed)
Can you please check on the status of her thyroid results?,  They were done 2 days ago and there are no results in the system, a lot of the patients that are seen later that day have already had the results back   Thanks

## 2022-11-09 ENCOUNTER — Other Ambulatory Visit: Payer: 59

## 2022-11-09 DIAGNOSIS — E039 Hypothyroidism, unspecified: Secondary | ICD-10-CM

## 2022-11-10 LAB — T4, FREE: Free T4: 1.1 ng/dL (ref 0.8–1.8)

## 2022-11-10 LAB — TSH: TSH: 1.11 mIU/L (ref 0.40–4.50)

## 2022-11-13 ENCOUNTER — Other Ambulatory Visit: Payer: 59

## 2022-11-14 ENCOUNTER — Ambulatory Visit (INDEPENDENT_AMBULATORY_CARE_PROVIDER_SITE_OTHER): Payer: 59 | Admitting: Psychiatry

## 2022-11-14 DIAGNOSIS — F0630 Mood disorder due to known physiological condition, unspecified: Secondary | ICD-10-CM | POA: Diagnosis not present

## 2022-11-14 DIAGNOSIS — S069XAS Unspecified intracranial injury with loss of consciousness status unknown, sequela: Secondary | ICD-10-CM

## 2022-11-14 NOTE — Progress Notes (Signed)
Virtual Visit via Video Note  I connected with Katie Robbins on 11/14/22 at 2:10 PM EDT  by a video enabled telemedicine application and verified that I am speaking with the correct person using two identifiers.  Location: Patient: Home Provider: [facility] [place] office    I discussed the limitations of evaluation and management by telemedicine and the availability of in person appointments. The patient expressed understanding and agreed to proceed.  I provided 50 minutes of non-face-to-face time during this encounter.   Adah Salvage, LCSW    THERAPIST PROGRESS NOTE  Session Time: Wednesday 11/14/2022 2:10 PM - 3:00 PM   Participation Level: Active  Behavioral Response: Alert/Anxious/Talkative/tangentiality/ tearful at times.   Type of Therapy: Individual Therapy  Treatment Goals addressed: Reduce worry, panic when interacting with others AEB pt reducing intensity of panic and nervousness from an 8 to 4 per ( 10 pt scale)pt's report Learn and implement relaxation techniques, practice daily      Progress on goals: Progressing    Interventions: CBT and Supportive  Summary: Katie Robbins is a 67 y.o. female who initially was referred for services for continuity of care while her previous therapist was on medical leave.  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 She is a returning patient to this clinician and last was seen in July 2023.  Per patient's report, her husband urged her to call as she cannot have a conversation with other people without interrupting.  She reports being irritable and staying manage with her husband for a couple of days if he says the wrong thing.  Patient states being very moody and becoming upset easily over little things.  She also reports crying almost daily.  She reports decreased interest and involvement in activities.  She states not leaving the house or socializing.  Patient states feeling  stressed about possibly saying wrong thing to husband as she fears he will leave her although she states knowing he will not leave.    Patient last was seen via virtual visit about 3 weeks ago.  She reports increased anxiety as she still is experiencing significant head pain.  She has been taking gabapentin and Robaxin as says this takes the edge off the pain.  However, she expresses frustration as she thought she would have recovered from the procedure but now and experienced less pain.  She states maybe some of the pain is related to her previous diagnosis of GBS.  She expresses frustration as she still cannot do some of the things she used to do and is having problems with balance.  However, she is trying to still engage in activities within her capability like small household task and working with her flowers.  She also has begun doing a [employer] fast streamer book.  She remains excited about she and her husband building a new home and has been very involved in the planning process.  She reports continued strong support from her husband and her children.  Suicidal/Homicidal: Nowithout intent/plan  Therapist Response: reviewed symptoms, discussed stressors, facilitated expression of thoughts and feelings, validated feelings, praised and reinforced pt's efforts to have realistic expectations of self and to pace self, praised and reinforced patient's efforts to remain involved in activity, assisted patient identify coping statements to manage anxiety provoking thoughts, developed plan with patient to use replacement statements   Diagnosis: Axis I: Mood disorder as late effect of TBI, Anxiety Disorder due to general medical condition  Collaboration  of Care: Other none needed at this session patient continues to see psychiatrist Dr. Sandria Manly for medication management  Patient/Guardian was advised Release of Information must be obtained prior to any record release in order to collaborate their care with an outside provider.  Patient/Guardian was advised if they have not already done so to contact the registration department to sign all necessary forms in order for [place] to release information regarding their care.   Consent: Patient/Guardian gives verbal consent for treatment and assignment of benefits for services provided during this visit. Patient/Guardian expressed understanding and agreed to proceed.      Adah Salvage, LCSW 11/14/2022   Katie Lights Sheron Tallman LCSW

## 2022-11-16 ENCOUNTER — Other Ambulatory Visit: Payer: Self-pay | Admitting: Primary Care

## 2022-11-16 DIAGNOSIS — I10 Essential (primary) hypertension: Secondary | ICD-10-CM

## 2022-12-11 ENCOUNTER — Other Ambulatory Visit: Payer: Self-pay | Admitting: Primary Care

## 2022-12-11 DIAGNOSIS — J4540 Moderate persistent asthma, uncomplicated: Secondary | ICD-10-CM

## 2022-12-19 ENCOUNTER — Telehealth (HOSPITAL_COMMUNITY): Payer: Self-pay | Admitting: Psychiatry

## 2022-12-19 ENCOUNTER — Ambulatory Visit (INDEPENDENT_AMBULATORY_CARE_PROVIDER_SITE_OTHER): Payer: 59 | Admitting: Psychiatry

## 2022-12-19 ENCOUNTER — Ambulatory Visit (INDEPENDENT_AMBULATORY_CARE_PROVIDER_SITE_OTHER): Payer: 59 | Admitting: Primary Care

## 2022-12-19 ENCOUNTER — Encounter: Payer: Self-pay | Admitting: Primary Care

## 2022-12-19 VITALS — BP 134/82 | HR 100 | Temp 97.7°F | Ht 64.0 in | Wt 138.0 lb

## 2022-12-19 DIAGNOSIS — S069XAS Unspecified intracranial injury with loss of consciousness status unknown, sequela: Secondary | ICD-10-CM

## 2022-12-19 DIAGNOSIS — M5010 Cervical disc disorder with radiculopathy, unspecified cervical region: Secondary | ICD-10-CM

## 2022-12-19 DIAGNOSIS — F0630 Mood disorder due to known physiological condition, unspecified: Secondary | ICD-10-CM | POA: Diagnosis not present

## 2022-12-19 DIAGNOSIS — E039 Hypothyroidism, unspecified: Secondary | ICD-10-CM | POA: Diagnosis not present

## 2022-12-19 DIAGNOSIS — R2689 Other abnormalities of gait and mobility: Secondary | ICD-10-CM

## 2022-12-19 DIAGNOSIS — F0670 Mild neurocognitive disorder due to known physiological condition without behavioral disturbance: Secondary | ICD-10-CM

## 2022-12-19 DIAGNOSIS — J4540 Moderate persistent asthma, uncomplicated: Secondary | ICD-10-CM

## 2022-12-19 DIAGNOSIS — Z0000 Encounter for general adult medical examination without abnormal findings: Secondary | ICD-10-CM | POA: Diagnosis not present

## 2022-12-19 DIAGNOSIS — F419 Anxiety disorder, unspecified: Secondary | ICD-10-CM | POA: Diagnosis not present

## 2022-12-19 DIAGNOSIS — R7303 Prediabetes: Secondary | ICD-10-CM

## 2022-12-19 DIAGNOSIS — K219 Gastro-esophageal reflux disease without esophagitis: Secondary | ICD-10-CM

## 2022-12-19 DIAGNOSIS — N183 Chronic kidney disease, stage 3 unspecified: Secondary | ICD-10-CM

## 2022-12-19 DIAGNOSIS — I10 Essential (primary) hypertension: Secondary | ICD-10-CM | POA: Diagnosis not present

## 2022-12-19 LAB — COMPREHENSIVE METABOLIC PANEL
ALT: 7 U/L (ref 0–35)
AST: 14 U/L (ref 0–37)
Albumin: 4.6 g/dL (ref 3.5–5.2)
Alkaline Phosphatase: 75 U/L (ref 39–117)
BUN: 21 mg/dL (ref 6–23)
CO2: 26 mEq/L (ref 19–32)
Calcium: 9.8 mg/dL (ref 8.4–10.5)
Chloride: 101 mEq/L (ref 96–112)
Creatinine, Ser: 1.42 mg/dL — ABNORMAL HIGH (ref 0.40–1.20)
GFR: 38.35 mL/min — ABNORMAL LOW (ref >60.00)
Glucose, Bld: 76 mg/dL (ref 70–99)
Potassium: 5 mEq/L (ref 3.5–5.1)
Sodium: 135 mEq/L (ref 135–145)
Total Bilirubin: 0.3 mg/dL (ref 0.2–1.2)
Total Protein: 7.6 g/dL (ref 6.0–8.3)

## 2022-12-19 LAB — LIPID PANEL
Cholesterol: 209 mg/dL — ABNORMAL HIGH (ref 0–200)
HDL: 95.1 mg/dL (ref >39.00)
LDL Cholesterol: 81 mg/dL (ref 0–99)
NonHDL: 113.78
Total CHOL/HDL Ratio: 2
Triglycerides: 164 mg/dL — ABNORMAL HIGH (ref 0.0–149.0)
VLDL: 32.8 mg/dL (ref 0.0–40.0)

## 2022-12-19 LAB — HEMOGLOBIN A1C: Hgb A1c MFr Bld: 5.7 % (ref 4.6–6.5)

## 2022-12-19 NOTE — Assessment & Plan Note (Signed)
Controlled.  Following with endocrinology. Reviewed office notes reviewed from June 2024.  Continue levothyroxine 100 mcg 6 days weekly. Continue levothyroxine 112 mcg 1 day weekly.  Reviewed TSH from June 2024

## 2022-12-19 NOTE — Patient Instructions (Signed)
Stop by the lab prior to leaving today. I will notify you of your results once received.   It was a pleasure to see you today!  

## 2022-12-19 NOTE — Assessment & Plan Note (Signed)
Following with neurosurgery.   S/P cervical spine surgery. Improving.   Continue methocarbamol 500 mg PRN, meloxicam 15 mg PRN.

## 2022-12-19 NOTE — Assessment & Plan Note (Signed)
Following with neuropsychiatry and psychology.  Overall stable per patient.  Continue Adderall 20 mg TID and diazepam 5 mg PRN.

## 2022-12-19 NOTE — Assessment & Plan Note (Signed)
Following with neuropsychiatry and psychology.  Continue Adderall 20 mg TID, diazepam 5 mg PRN.

## 2022-12-19 NOTE — Assessment & Plan Note (Signed)
Chronic. Also with intellectual/developmental issues causing imbalance.   Secondary to prior brain injury and stroke. Will provide letter for her to use during airline travel.

## 2022-12-19 NOTE — Assessment & Plan Note (Signed)
Controlled.  Following with pulmonology, reviewed office notes from May 2024.  Remain off Symbicort for now. Continue albuterol inhaler PRN.

## 2022-12-19 NOTE — Assessment & Plan Note (Signed)
Immunizations UTD.  Mammogram UTD Colonoscopy UTD, due 2028  Discussed the importance of a healthy diet and regular exercise in order for weight loss, and to reduce the risk of further co-morbidity.  Exam stable. Labs pending.  Follow up in 1 year for repeat physical.

## 2022-12-19 NOTE — Assessment & Plan Note (Signed)
Controlled.  Continue pantoprazole 40 mg daily. 

## 2022-12-19 NOTE — Assessment & Plan Note (Signed)
Stable compared to labs from April 2024. Repeat renal function pending.

## 2022-12-19 NOTE — Telephone Encounter (Signed)
Therapist attempted to contact patient via text and via email through caregility platform for scheduled appointment, no response.  Therapist called patient, left message indicating attempt, and requesting patient call office.

## 2022-12-19 NOTE — Assessment & Plan Note (Signed)
Overall controlled.  Continue amlodipine 5 mg daily, olmesartan 20 mg daily. CMP pending

## 2022-12-19 NOTE — Progress Notes (Signed)
Subjective:    Patient ID: Katie Robbins, female    DOB: 06-16-1955, 67 y.o.   MRN: [id number]  HPI  [name] is a very pleasant 67 y.o. female who presents today for complete physical and follow up of chronic conditions.  Immunizations: -Tetanus: Completed in 2016 -Shingles: Completed Shingrix vaccine x 1 dose -Pneumonia: Completed in 2016  Diet: Fair diet.  Exercise: No regular exercise.  Eye exam: Completes annually  Dental exam: Completes semi-annually    Pap Smear: Hysterectomy  Mammogram: October 2023 Bone Density Scan: September 2022  Colonoscopy: Completed in 2021, due 2028  BP Readings from Last 3 Encounters:  12/19/22 134/82  11/06/22 132/74  10/09/22 (!) 151/82       Review of Systems  Constitutional:  Negative for unexpected weight change.  HENT:  Negative for rhinorrhea.   Respiratory:  Negative for cough and shortness of breath.   Cardiovascular:  Negative for chest pain.  Gastrointestinal:  Negative for constipation and diarrhea.  Genitourinary:  Negative for difficulty urinating.  Musculoskeletal:  Positive for arthralgias.  Skin:  Negative for rash.  Allergic/Immunologic: Negative for environmental allergies.  Neurological:  Negative for dizziness and headaches.  Psychiatric/Behavioral:  The patient is not nervous/anxious.          Past Medical History:  Diagnosis Date   Acute thoracic back pain 07/22/2018   ADHD (attention deficit hyperactivity disorder)    Anemia    Abnormal Uterine Bleeding - Resulted in Hysterectomy   Anxiety    Arthritis    Asthma    Chicken pox    Chronic headaches    Chronic kidney disease    CKD Stage 3a. No HD   Diverticulitis    Essential hypertension    GERD (gastroesophageal reflux disease)    Guillain Barr syndrome (HCC) 1982   H/O breast implant    Heart murmur    Hypertension    Hypothyroidism    Interstitial cystitis    Interstitial cystitis    Mitral prolapse 1985   Non-cardiac chest  pain 06/13/2020   Nutcracker esophagus    Osteoarthritis    Soft tissue mass 03/31/2020   Stroke (HCC)    Multiple TIAs. Last one Feb. 2024    Social History   Socioeconomic History   Marital status: Married    Spouse name: Katie Robbins   Number of children: 2   Years of education: Some college   Highest education level: Not on file  Occupational History   Occupation: Retired  Tobacco Use   Smoking status: Never   Smokeless tobacco: Never  Vaping Use   Vaping status: Never Used  Substance and Sexual Activity   Alcohol use: Yes    Alcohol/week: 3.0 standard drinks of alcohol    Types: 2 Cans of beer, 1 Shots of liquor per week   Drug use: No   Sexual activity: Yes    Birth control/protection: Surgical  Other Topics Concern   Not on file  Social History Narrative   Lives with husband   Caffeine use: rare   Right handed   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  4 years active-2 years inactive   Social Determinants of Corporate investment banker Strain: Not on file  Food Insecurity: Not on file  Transportation Needs: Not on file  Physical Activity: Not on file  Stress: Not on file  Social Connections: Not on file  Intimate Partner Violence: Not on file    Past Surgical History:  Procedure Laterality Date   ABDOMINAL  HYSTERECTOMY  2000/2009   APPENDECTOMY  2007   AUGMENTATION MAMMAPLASTY     BELPHAROPTOSIS REPAIR Bilateral    Cosmetic. Eye Lift   BOWEL RESECTION  2007   CARDIAC CATHETERIZATION  2016   Clean   CARPAL TUNNEL RELEASE Right    CHOLECYSTECTOMY  2012   COLONOSCOPY WITH PROPOFOL N/A 04/13/2020   Procedure: COLONOSCOPY WITH PROPOFOL;  Surgeon: Toney Reil, MD;  Location: [facility] ENDOSCOPY;  Service: Gastroenterology;  Laterality: N/A;   ESOPHAGOGASTRODUODENOSCOPY (EGD) WITH PROPOFOL N/A 04/13/2020   Procedure: ESOPHAGOGASTRODUODENOSCOPY (EGD) WITH PROPOFOL;  Surgeon: Toney Reil, MD;  Location: [facility] ENDOSCOPY;  Service: Gastroenterology;  Laterality: N/A;   NECK  SURGERY  06/11/2016   Fusion C3-C7 Dr. Wynetta Emery   POSTERIOR CERVICAL LAMINECTOMY Bilateral 08/16/2022   Procedure: Bilateral Cervical Two ganglionectomies;  Surgeon: Jadene Pierini, MD;  Location: [facility] OR;  Service: Neurosurgery;  Laterality: Bilateral;   SMALL INTESTINE SURGERY  2007   Bowel resection   TONSILLECTOMY     VESICO-VAGINAL FISTULA REPAIR  2007    Family History  Problem Relation Age of Onset   Hypertension Mother    GER disease Mother    Polymyalgia rheumatica Mother    COPD Mother    Osteoarthritis Mother    Osteoporosis Mother    Other Mother        Giant Cell Arteritis    GER disease Father    Heart attack Father    Spondylolysis Father     Allergies  Allergen Reactions   Bactrim [Sulfamethoxazole-Trimethoprim] Hives   Macrobid [Nitrofurantoin Macrocrystal] Hives   Tizanidine Other (See Comments)    Bad Mood change Migraines   Vicodin [Hydrocodone-Acetaminophen] Swelling    Puffy eyes and face   Trileptal [Oxcarbazepine] Rash   Abilify [Aripiprazole] Other (See Comments)    Bad leg cramps   Acetaminophen     Other reaction(s): Puffy face/eyes   Amaryl [Glimepiride] Other (See Comments)    Not effective   Ambien [Zolpidem] Other (See Comments)    Did not work   Cyclobenzaprine Other (See Comments)    Not effective   Erythromycin Base     Other Reaction(s): Rash   Gabapentin Other (See Comments)    XR makes her Groggy   Hydrocodone Swelling    Eyes and face   Klonopin [Clonazepam] Other (See Comments)    Make her feel groggy   Nexium [Esomeprazole Magnesium] Nausea And Vomiting    Face swelling, hives    Nortriptyline     insomnia   Olanzapine Other (See Comments)    Not effective   Pregabalin Other (See Comments)    Not effective   Prilosec [Omeprazole] Nausea And Vomiting    Face swelling, hives    Quetiapine Other (See Comments)    Groggy   Reglan [Metoclopramide] Nausea Only   Savella [Milnacipran] Other (See Comments)    Not  effective   Sulfa Antibiotics Hives   Tamoxifen    Tapentadol Other (See Comments)    Not effective   Topiramate Other (See Comments)    Not effective   Tramadol Other (See Comments)    Not effective   Trimethoprim     Other reaction(s): Hives   Wasp Venom Hives    swelling   Zoloft [Sertraline] Other (See Comments)    Not effective   Erythromycin Rash   Lamotrigine Rash    Bad mood swings   Latex Rash   Molds & Smuts Anxiety    Black Mold  Current Outpatient Medications on File Prior to Visit  Medication Sig Dispense Refill   albuterol (VENTOLIN HFA) 108 (90 Base) MCG/ACT inhaler INHALE TWO PUFFS INTO THE LUNGS EVERY SIX HOURS AS NEEDED FOR WHEEZING OR SHORTNESS OF BREATH. 8.5 g 0   amLODipine (NORVASC) 5 MG tablet TAKE ONE TABLET BY MOUTH ONCE DAILY FOR BLOOD PRESSURE 90 tablet 0   amphetamine-dextroamphetamine (ADDERALL) 20 MG tablet Take 20 mg by mouth in the morning, at noon, and at bedtime.     aspirin EC 81 MG tablet Take 1 tablet (81 mg total) by mouth in the morning. 30 tablet 12   budesonide-formoterol (SYMBICORT) 160-4.5 MCG/ACT inhaler Inhale 2 puffs into the lungs 2 (two) times daily. 10.2 g 4   cyanocobalamin (VITAMIN B12) 1000 MCG/ML injection INJECT ONE VIAL (1 ML) INTO THE MUSCLE EVERY THREE TO FOUR WEEKS 12 mL 1   diazepam (VALIUM) 5 MG tablet Take 5 mg by mouth 4 (four) times daily as needed for anxiety.     EPINEPHrine 0.3 mg/0.3 mL IJ SOAJ injection Inject 0.3 mLs (0.3 mg total) into the muscle as needed for anaphylaxis. 1 each 0   estradiol (ESTRACE) 1 MG tablet Take 1 tablet (1 mg total) by mouth daily. 30 tablet 11   levothyroxine (SYNTHROID) 100 MCG tablet Take 1 tablet (100 mcg total) by mouth as directed. 1 tablet Monday through Saturdays 72 tablet 3   levothyroxine (SYNTHROID) 112 MCG tablet Take 1 tablet (112 mcg total) by mouth as directed. 1 tablet on Sundays 12 tablet 3   methocarbamol (ROBAXIN) 500 MG tablet Take 500 mg by mouth as needed.      olmesartan (BENICAR) 20 MG tablet Take 1 tablet (20 mg total) by mouth daily. 90 tablet 1   pantoprazole (PROTONIX) 40 MG tablet Take 1 tablet (40 mg total) by mouth daily. for heartburn. 90 tablet 1   PRESCRIPTION MEDICATION Take 15 mLs by mouth daily as needed (Esophageal Spasm). 450 Gi cocktail  Bentyl     No current facility-administered medications on file prior to visit.    BP 134/82   Pulse 100   Temp 97.7 F (36.5 C) (Temporal)   Ht 5\' 4"  (1.626 m)   Wt 138 lb (62.6 kg)   SpO2 97%   BMI 23.69 kg/m  Objective:   Physical Exam HENT:     Right Ear: Tympanic membrane and ear canal normal.     Left Ear: Tympanic membrane and ear canal normal.     Nose: Nose normal.  Eyes:     Conjunctiva/sclera: Conjunctivae normal.     Pupils: Pupils are equal, round, and reactive to light.  Neck:     Thyroid: No thyromegaly.  Cardiovascular:     Rate and Rhythm: Normal rate and regular rhythm.     Heart sounds: No murmur heard. Pulmonary:     Effort: Pulmonary effort is normal.     Breath sounds: Normal breath sounds. No rales.  Abdominal:     General: Bowel sounds are normal.     Palpations: Abdomen is soft.     Tenderness: There is no abdominal tenderness.  Musculoskeletal:        General: Normal range of motion.     Cervical back: Neck supple.  Lymphadenopathy:     Cervical: No cervical adenopathy.  Skin:    General: Skin is warm and dry.     Findings: No rash.  Neurological:     Mental Status: She is alert and oriented to  person, place, and time.     Cranial Nerves: No cranial nerve deficit.     Deep Tendon Reflexes: Reflexes are normal and symmetric.  Psychiatric:        Mood and Affect: Mood normal.           Assessment & Plan:  Preventative health care Assessment & Plan: Immunizations UTD.  Mammogram UTD Colonoscopy UTD, due 2028  Discussed the importance of a healthy diet and regular exercise in order for weight loss, and to reduce the risk of further  co-morbidity.  Exam stable. Labs pending.  Follow up in 1 year for repeat physical.    Essential hypertension Assessment & Plan: Overall controlled.  Continue amlodipine 5 mg daily, olmesartan 20 mg daily. CMP pending  Orders: -     Lipid panel -     Comprehensive metabolic panel  Hypothyroidism, unspecified type Assessment & Plan: Controlled.  Following with endocrinology. Reviewed office notes reviewed from June 2024.  Continue levothyroxine 100 mcg 6 days weekly. Continue levothyroxine 112 mcg 1 day weekly.  Reviewed TSH from June 2024   Mild neurocognitive disorder due to traumatic brain injury [facility]) Assessment & Plan: Following with neuropsychiatry and psychology.  Continue Adderall 20 mg TID, diazepam 5 mg PRN.   Cervical disc disorder with radiculopathy of cervical region Assessment & Plan: Following with neurosurgery.   S/P cervical spine surgery. Improving.   Continue methocarbamol 500 mg PRN, meloxicam 15 mg PRN.    Moderate persistent asthma without complication Assessment & Plan: Controlled.  Following with pulmonology, reviewed office notes from May 2024.  Remain off Symbicort for now. Continue albuterol inhaler PRN.    Gastroesophageal reflux disease, unspecified whether esophagitis present Assessment & Plan: Controlled.  Continue pantoprazole 40 mg daily.     Stage 3 chronic kidney disease, unspecified whether stage 3a or 3b CKD (HCC) Assessment & Plan: Stable compared to labs from April 2024. Repeat renal function pending.   Mood disorder as late effect of traumatic brain injury [facility]) Assessment & Plan: Following with neuropsychiatry and psychology.  Overall stable per patient.  Continue Adderall 20 mg TID and diazepam 5 mg PRN.   Prediabetes Assessment & Plan: Repeat A1C pending.   Orders: -     Hemoglobin A1c  Imbalance Assessment & Plan: Chronic. Also with intellectual/developmental issues causing  imbalance.   Secondary to prior brain injury and stroke. Will provide letter for her to use during airline travel.           Doreene Nest, NP

## 2022-12-19 NOTE — Progress Notes (Signed)
Virtual Visit via Video Note  I connected with Katie Robbins on 12/19/22 at 1:18 PM EDT by a video enabled telemedicine application and verified that I am speaking with the correct person using two identifiers.  Location: Patient: Home Provider: [facility] Outpatient [place] office    I discussed the limitations of evaluation and management by telemedicine and the availability of in person appointments. The patient expressed understanding and agreed to proceed.    I provided 42 minutes of non-face-to-face time during this encounter.   Katie Salvage, LCSW    THERAPIST PROGRESS NOTE  Session Time: Wednesday 12/19/2022 1:18 PM -  2:00 PM   Participation Level: Active  Behavioral Response: Alert/Anxious/Talkative/tangentiality/ tearful at times.   Type of Therapy: Individual Therapy  Treatment Goals addressed: Reduce worry, panic when interacting with others AEB pt reducing intensity of panic and nervousness from an 8 to 4 per ( 10 pt scale)pt's report Learn and implement relaxation techniques, practice daily      Progress on goals: Progressing    Interventions: CBT and Supportive  Summary: Katie Robbins is a 67 y.o. female who initially was referred for services for continuity of care while her previous therapist was on medical leave.  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 She is a returning patient to this clinician and last was seen in July 2023.  Per patient's report, her husband urged her to call as she cannot have a conversation with other people without interrupting.  She reports being irritable and staying manage with her husband for a couple of days if he says the wrong thing.  Patient states being very moody and becoming upset easily over little things.  She also reports crying almost daily.  She reports decreased interest and involvement in activities.  She states not leaving the house or socializing.  Patient states feeling  stressed about possibly saying wrong thing to husband as she fears he will leave her although she states knowing he will not leave.    Patient last was seen via virtual visit about 3-4 weeks ago.  She reports decreased stress as she is experiencing decreased head pain.  She now is wearing a neck collar more frequently and this has enabled her to participate more in activities she likes per her report.  She has been working in her flower beds. She also has been driving alone to do errands such as going to the post office/to the cleaners/and medical appointments.  Patient reports feeling better now that she is able to engage in more activities.  She also is planning to visit her children in [place] in September.  She reports stress and anxiety regarding two recent episodes where she became angry and was "ranting and raving " excessively.  Per her report, her husband observed and commented that she was mean during that time and pt normally "doesn't have a mean bone in her body". Patient expresses frustration with herself as well as worries about her behavior.    Suicidal/Homicidal: Nowithout intent/plan  Therapist Response: reviewed symptoms, raising reinforced patient's increased behavioral activation, discussed effects, assisted patient identify triggers of recent episodes, assisted patient identify early warning signs of anger including thoughts and body sensations, assisted patient identify healthy ways to intervene to express anger, developed plan with patient to use drawing   Diagnosis: Axis I: Mood disorder as late effect of TBI, Anxiety Disorder due to general medical condition  Collaboration of Care: Other none needed  at this session patient continues to see psychiatrist Dr. Sandria Manly for medication management  Patient/Guardian was advised Release of Information must be obtained prior to any record release in order to collaborate their care with an outside provider. Patient/Guardian was advised if they  have not already done so to contact the registration department to sign all necessary forms in order for [place] to release information regarding their care.   Consent: Patient/Guardian gives verbal consent for treatment and assignment of benefits for services provided during this visit. Patient/Guardian expressed understanding and agreed to proceed.      Katie Salvage, LCSW 12/19/2022   Katie Lights  LCSW

## 2022-12-19 NOTE — Assessment & Plan Note (Signed)
Repeat A1C pending. 

## 2023-01-02 ENCOUNTER — Ambulatory Visit (INDEPENDENT_AMBULATORY_CARE_PROVIDER_SITE_OTHER): Payer: 59 | Admitting: Psychiatry

## 2023-01-02 DIAGNOSIS — F419 Anxiety disorder, unspecified: Secondary | ICD-10-CM | POA: Diagnosis not present

## 2023-01-02 DIAGNOSIS — F0630 Mood disorder due to known physiological condition, unspecified: Secondary | ICD-10-CM

## 2023-01-02 DIAGNOSIS — F39 Unspecified mood [affective] disorder: Secondary | ICD-10-CM

## 2023-01-02 DIAGNOSIS — S069XAS Unspecified intracranial injury with loss of consciousness status unknown, sequela: Secondary | ICD-10-CM | POA: Diagnosis not present

## 2023-01-02 NOTE — Progress Notes (Signed)
Virtual Visit via Video Note  I connected with Katie Robbins on 01/02/23 at 2:08 PM EDT  by a video enabled telemedicine application and verified that I am speaking with the correct person using two identifiers.  Location: Patient: Home Provider: [facility] Outpatient [place] office    I discussed the limitations of evaluation and management by telemedicine and the availability of in person appointments. The patient expressed understanding and agreed to proceed.  I provided 52 minutes of non-face-to-face time during this encounter.   Adah Salvage, LCSW    THERAPIST PROGRESS NOTE  Session Time: Wednesday 01/02/2023 2:08 PM - 3:00 PM Participation Level: Active  Behavioral Response: Alert/Anxious/Talkative/tangentiality  Type of Therapy: Individual Therapy  Treatment Goals addressed: Reduce worry, panic when interacting with others AEB pt reducing intensity of panic and nervousness from an 8 to 4 per ( 10 pt scale)pt's report Learn and implement relaxation techniques, practice daily      Progress on goals: Progressing    Interventions: CBT and Supportive  Summary: Jayann Vaquero is a 67 y.o. female who initially was referred for services for continuity of care while her previous therapist was on medical leave.  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 She is a returning patient to this clinician and last was seen in July 2023.  Per patient's report, her husband urged her to call as she cannot have a conversation with other people without interrupting.  She reports being irritable and staying manage with her husband for a couple of days if he says the wrong thing.  Patient states being very moody and becoming upset easily over little things.  She also reports crying almost daily.  She reports decreased interest and involvement in activities.  She states not leaving the house or socializing.  Patient states feeling stressed about possibly  saying wrong thing to husband as she fears he will leave her although she states knowing he will not leave.    Patient last was seen via virtual visit about 2 weeks ago.  She reports continued involvement in activities such as light household tasks and doing errands. She also is pleased she is experiencing decreased neck pain. Patient plans to start physical therapy tomorrow in hopes that will help strengthen neck. She expresses some anxiety about attending an upcoming wedding as she worries the limited range of motion in her neck will affect her ability to interact with others. She has been using coping statements to try to manage. She reports increased stress, anxiety, and anger regarding marital issues. She reports conflict with husband regarding a longstanding issue in their marriage for the past couple of years. She also expresses frustration and hurt as she states husband makes her feel stupid when he makes certain comments about her behavior.  She reports trying to be assertive in expressing her opinions. Patient still is looking forward to visiting her children in [place] next month and is hopeful this time away will be beneficial to her husband.  Suicidal/Homicidal: Nowithout intent/plan  Therapist Response: reviewed symptoms, praised and reinforced patient's continued behavioral activation, discussed effects, facilitated patient expressing thoughts and feelings regarding issues in her marriage, validated feelings, assisted patient examine her pattern of interaction with husband, praised and reinforced patient's effort to try to use assertiveness skills, assisted patient identify realistic expectations of self, assisted patient identify the contribution of various factors to husband's behavior and issues in their marriage, also discussed ways to assist pt in  improving communication in the marriage including writing down key issues discussed and to discuss.   Diagnosis: Axis I: Mood disorder as late  effect of TBI, Anxiety Disorder due to general medical condition  Collaboration of Care: Other none needed at this session patient continues to see psychiatrist Dr. Sandria Manly for medication management  Patient/Guardian was advised Release of Information must be obtained prior to any record release in order to collaborate their care with an outside provider. Patient/Guardian was advised if they have not already done so to contact the registration department to sign all necessary forms in order for [place] to release information regarding their care.   Consent: Patient/Guardian gives verbal consent for treatment and assignment of benefits for services provided during this visit. Patient/Guardian expressed understanding and agreed to proceed.      Adah Salvage, LCSW 01/02/2023   Ninetta Lights Issiah Huffaker LCSW

## 2023-01-03 ENCOUNTER — Other Ambulatory Visit: Payer: Self-pay | Admitting: Primary Care

## 2023-01-03 DIAGNOSIS — J4540 Moderate persistent asthma, uncomplicated: Secondary | ICD-10-CM

## 2023-01-08 ENCOUNTER — Other Ambulatory Visit: Payer: Self-pay | Admitting: Primary Care

## 2023-01-08 DIAGNOSIS — K219 Gastro-esophageal reflux disease without esophagitis: Secondary | ICD-10-CM

## 2023-01-16 ENCOUNTER — Ambulatory Visit: Payer: 59 | Attending: Internal Medicine | Admitting: Internal Medicine

## 2023-01-16 ENCOUNTER — Encounter: Payer: Self-pay | Admitting: Internal Medicine

## 2023-01-16 VITALS — BP 136/70 | HR 89 | Ht 63.0 in | Wt 139.4 lb

## 2023-01-16 DIAGNOSIS — I10 Essential (primary) hypertension: Secondary | ICD-10-CM

## 2023-01-16 DIAGNOSIS — I341 Nonrheumatic mitral (valve) prolapse: Secondary | ICD-10-CM | POA: Diagnosis not present

## 2023-01-16 DIAGNOSIS — R0789 Other chest pain: Secondary | ICD-10-CM

## 2023-01-16 NOTE — Progress Notes (Unsigned)
  Cardiology Office Note:  .   Date:  01/17/2023  ID:  Katie Robbins, DOB Aug 30, 1955, MRN [id number] PCP: Katie Nest, NP  [facility] Providers Cardiologist:  Katie Kendall, MD     History of Present Illness: .   Katie Robbins is a 67 y.o. female with history of hypertension, questionable mitral valve prolapse with mild mitral regurgitation, chronic chest pain attributed to nutcracker esophagus, left upper extremity DVT associated with PICC line (2007), Guillain-Barre syndrome, hypothyroidism, osteoarthritis, and anxiety, who returns for follow-up of chest pain and shortness of breath.  She was last seen in our office in May by Katie Quest, NP, at which time she was feeling fairly well.  She complained of some continued neck discomfort, having undergone neck surgery about 8 weeks earlier.  Today, Katie Robbins reports that she feels worn out after running a lot of errands.  She has stable exertional dyspnea when climbing stairs but otherwise has felt well, denying chest pain, palpitations, lightheadedness, and edema.  She is participating in physical therapy to help relieve some of the tightness in her neck muscles following her neck surgery earlier this year.  ROS: See HPI  Studies Reviewed: Marland Kitchen   EKG Interpretation Date/Time:  Wednesday January 16 2023 16:00:26 EDT Ventricular Rate:  89 PR Interval:  168 QRS Duration:  88 QT Interval:  388 QTC Calculation: 472 R Axis:   -5  Text Interpretation: Normal sinus rhythm Normal ECG When compared with ECG of 30-May-2022 No significant change was found Confirmed by Katie Robbins, Katie Robbins [phone]) on 01/17/2023 2:17:45 PM    TTE (07/06/2022): Normal LV size and wall thickness.  LVEF 60-65% with grade 1 diastolic dysfunction.  Normal RV size and function.  Normal biatrial size.  No pericardial effusion.  Mildly calcified mitral valve with mild regurgitation.  Normal tricuspid valve.  Normal aortic valve.  Normal CVP.  Coronary CTA  (06/25/2022): Normal coronary arteries without stenosis or plaque.  Coronary calcium score 0.  No acute extracardiac findings in the visualized chest.  Risk Assessment/Calculations:             Physical Exam:   VS:  BP 136/70 (BP Location: Left Arm)   Pulse 89   Ht 5\' 3"  (1.6 m)   Wt 139 lb 6 oz (63.2 kg)   SpO2 98%   BMI 24.69 kg/m    Wt Readings from Last 3 Encounters:  01/16/23 139 lb 6 oz (63.2 kg)  12/19/22 138 lb (62.6 kg)  11/06/22 140 lb 3.2 oz (63.6 kg)    General:  NAD. Neck: No JVD or HJR. Lungs: Clear to auscultation bilaterally without wheezes or crackles. Heart: Regular rate and rhythm without murmurs. Midsystolic click noted. Abdomen: Soft, nontender, nondistended. Extremities: No lower extremity edema.  ASSESSMENT AND PLAN: .    Hypertension: Initial BP mildly elevated, improved on recheck.  Continue current regimen of amlodipine (also being used for esophageal spasm) and olmesartan.  Chest pain: No recurrence reported today with reassuring workup.  Continue amlodipine for esophageal spasm.  Mitral valve prolapse: Echo earlier this year read as structurally normal mitral valve with mild MR.  Midsystolic click is audible on exam.  I have reviewed her echo, which shows myxomatous appearing mitral valve with mild prolapse.  We will continue clinical follow-up; consider repeat echo in 3-5 years (sooner if symptoms progress).    Dispo: Return to clinic in 1 year.  Signed, Katie Kendall, MD

## 2023-01-16 NOTE — Patient Instructions (Signed)
Medication Instructions:   Your physician recommends that you continue on your current medications as directed. Please refer to the Current Medication list given to you today.  *If you need a refill on your cardiac medications before your next appointment, please call your pharmacy*   Lab Work:  NONE  If you have labs (blood work) drawn today and your tests are completely normal, you will receive your results only by: MyChart Message (if you have MyChart) OR A paper copy in the mail If you have any lab test that is abnormal or we need to change your treatment, we will call you to review the results.   Testing/Procedures:  NONE   Follow-Up: At [facility], you and your health needs are our priority.  As part of our continuing mission to provide you with exceptional heart care, we have created designated Provider Care Teams.  These Care Teams include your primary Cardiologist (physician) and Advanced Practice Providers (APPs -  Physician Assistants and Nurse Practitioners) who all work together to provide you with the care you need, when you need it.  We recommend signing up for the patient portal called "MyChart".  Sign up information is provided on this After Visit Summary.  MyChart is used to connect with patients for Virtual Visits (Telemedicine).  Patients are able to view lab/test results, encounter notes, upcoming appointments, etc.  Non-urgent messages can be sent to your provider as well.   To learn more about what you can do with MyChart, go to [email or web address].    Your next appointment:   12 month(s)  Provider:   You may see Yvonne Kendall, MD or one of the following Advanced Practice Providers on your designated Care Team:   Nicolasa Ducking, NP Eula Listen, PA-C Cadence Fransico Michael, PA-C Charlsie Quest, NP

## 2023-01-17 ENCOUNTER — Encounter: Payer: Self-pay | Admitting: Internal Medicine

## 2023-01-17 DIAGNOSIS — R0789 Other chest pain: Secondary | ICD-10-CM | POA: Insufficient documentation

## 2023-01-17 DIAGNOSIS — I341 Nonrheumatic mitral (valve) prolapse: Secondary | ICD-10-CM | POA: Insufficient documentation

## 2023-02-05 ENCOUNTER — Other Ambulatory Visit: Payer: Self-pay | Admitting: Primary Care

## 2023-02-05 DIAGNOSIS — I10 Essential (primary) hypertension: Secondary | ICD-10-CM

## 2023-02-25 ENCOUNTER — Ambulatory Visit (INDEPENDENT_AMBULATORY_CARE_PROVIDER_SITE_OTHER): Payer: 59 | Admitting: Psychiatry

## 2023-02-25 DIAGNOSIS — F419 Anxiety disorder, unspecified: Secondary | ICD-10-CM

## 2023-02-25 DIAGNOSIS — F0630 Mood disorder due to known physiological condition, unspecified: Secondary | ICD-10-CM | POA: Diagnosis not present

## 2023-02-25 DIAGNOSIS — S069XAS Unspecified intracranial injury with loss of consciousness status unknown, sequela: Secondary | ICD-10-CM

## 2023-02-25 NOTE — Progress Notes (Signed)
Virtual Visit via Video Note  I connected with Katie Robbins on 02/25/23 at 3:10 PM EDT by a video enabled telemedicine application and verified that I am speaking with the correct person using two identifiers.  Location: Patient: Home Provider: [facility] [place] office    I discussed the limitations of evaluation and management by telemedicine and the availability of in person appointments. The patient expressed understanding and agreed to proceed.  I provided 40 minutes of non-face-to-face time during this encounter.   Adah Salvage, LCSW   THERAPIST PROGRESS NOTE  Session Time: Monday  02/25/2023 3:10 PM - 3:50 PM   Participation Level: Active  Behavioral Response: Alert/Anxious/Talkative/tangentiality  Type of Therapy: Individual Therapy  Treatment Goals addressed: Reduce worry, panic when interacting with others AEB pt reducing intensity of panic and nervousness from an 8 to 4 per ( 10 pt scale)pt's report Learn and implement relaxation techniques, practice daily      Progress on goals: Progressing    Interventions: CBT and Supportive  Summary: Katie Robbins is a 67 y.o. female who initially was referred for services for continuity of care while her previous therapist was on medical leave.  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 She is a returning patient to this clinician and last was seen in July 2023.  Per patient's report, her husband urged her to call as she cannot have a conversation with other people without interrupting.  She reports being irritable and staying manage with her husband for a couple of days if he says the wrong thing.  Patient states being very moody and becoming upset easily over little things.  She also reports crying almost daily.  She reports decreased interest and involvement in activities.  She states not leaving the house or socializing.  Patient states feeling stressed about possibly  saying wrong thing to husband as she fears he will leave her although she states knowing he will not leave.    Patient last was seen via virtual visit about 2 months ago.  She reports enjoying recent 2-1/2-week trip to [place] to see her children and grandchildren.  While there, she reports experiencing some conflict with her daughter-in-law but managing well using assertiveness skills.  Patient reports this did not overshadow the visit.  Reports decreased stress and anxiety regarding marital issues as she used assertiveness skills to express her concerns regarding a longstanding issue in their marriage.  Per patient's report, husband was receptive and had a positive response.  She is pleased he set limits and respected her wishes.  She reports improvement in their relationship.  Patient continues to have concerns about her health as he reports sometimes having difficulty accepting her limitations.  She is still experiencing pain in her neck and plans to follow-up with her doctor.  Patient is maintaining involvement in activities.     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 Sandria Manly for medication management  Patient/Guardian was advised Release of Information must be obtained prior to any  record release in order to collaborate their care with an outside provider. Patient/Guardian was advised if they have not already done so to contact the registration department to sign all necessary forms in order for [place] to release information regarding their care.   Consent: Patient/Guardian gives verbal consent for treatment and assignment of benefits for services provided during this visit. Patient/Guardian expressed understanding and agreed to proceed.      Adah Salvage, LCSW 02/25/2023   Katie Robbins E Katie Geraci LCSW

## 2023-03-01 NOTE — Telephone Encounter (Signed)
Pt dropped off documents for you to look over.  Pt stated that you guys had a conversation via mychart about what she's bringing in. Paperwork placed in your mailbox.  Thanks [employer]

## 2023-03-11 ENCOUNTER — Other Ambulatory Visit: Payer: Self-pay | Admitting: Internal Medicine

## 2023-03-11 ENCOUNTER — Ambulatory Visit (HOSPITAL_COMMUNITY): Payer: 59 | Admitting: Psychiatry

## 2023-03-11 DIAGNOSIS — F0630 Mood disorder due to known physiological condition, unspecified: Secondary | ICD-10-CM

## 2023-03-11 DIAGNOSIS — F419 Anxiety disorder, unspecified: Secondary | ICD-10-CM

## 2023-03-11 DIAGNOSIS — F064 Anxiety disorder due to known physiological condition: Secondary | ICD-10-CM

## 2023-03-11 DIAGNOSIS — S069XAS Unspecified intracranial injury with loss of consciousness status unknown, sequela: Secondary | ICD-10-CM

## 2023-03-11 DIAGNOSIS — E039 Hypothyroidism, unspecified: Secondary | ICD-10-CM

## 2023-03-11 NOTE — Progress Notes (Unsigned)
Virtual Visit via Video Note  I connected with Katie Robbins on 03/11/23 at 3:07 PM EDT  by a video enabled telemedicine application and verified that I am speaking with the correct person using two identifiers.  Location: Patient: Home Provider: [facility] [place] office    I discussed the limitations of evaluation and management by telemedicine and the availability of in person appointments. The patient expressed understanding and agreed to proceed.  I provided 51 minutes of non-face-to-face time during this encounter.   Adah Salvage, LCSW  THERAPIST PROGRESS NOTE  Session Time: Monday  03/11/2023 3:07 PM - 3:58 PM   Participation Level: Active  Behavioral Response: Alert/Anxious/Talkative/tangentiality  Type of Therapy: Individual Therapy  Treatment Goals addressed: Reduce worry, panic when interacting with others AEB pt reducing intensity of panic and nervousness from an 8 to 4 per ( 10 pt scale)pt's report Learn and implement relaxation techniques, practice daily      Progress on goals: Progressing    Interventions: CBT and Supportive  Summary: Katie Robbins is a 67 y.o. female who initially was referred for services for continuity of care while her previous therapist was on medical leave.  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 She is a returning patient to this clinician and last was seen in July 2023.  Per patient's report, her husband urged her to call as she cannot have a conversation with other people without interrupting.  She reports being irritable and staying manage with her husband for a couple of days if he says the wrong thing.  Patient states being very moody and becoming upset easily over little things.  She also reports crying almost daily.  She reports decreased interest and involvement in activities.  She states not leaving the house or socializing.  Patient states feeling stressed about possibly  saying wrong thing to husband as she fears he will leave her although she states knowing he will not leave.    Patient last was seen via virtual visit about 2 weeks ago.  She reports increased behavioral activation and socialization since last session.  She says she has been recently went to a bar with friends.  Patient reports enjoying this very much.  She reports feeling much better regarding interaction with familiar people and experiencing little to no panic or nervousness when doing this.  She still experiences some nervousness when around unfamiliar people and surroundings.  Per her report, she is trying to do more household task such as packing in preparation for their eventual move to the new home.  She also reports she has not driven alone since last session and states driving to a local station to have a car inspected.  Patient reports increased assertive communication with her husband and reports this has been helpful.  However, she reports increased frustration with self she could not assist her husband in the manner she thinks she should.  She reports still sometimes worrying she is a burden to her husband.     Therapist Response: reviewed symptoms, praised and reinforced increased socialization, discussed effects, praised and reinforced patient's continued efforts to use assertiveness skills in the relationship with her husband, discussed effects, facilitated patient expressing thoughts and feelings regarding her contributions in the marriage, began to assist patient examine her thought patterns particularly should and ought assisted patient identify realistic expectations of self   Diagnosis: Axis I: Mood disorder as late effect of TBI, Anxiety Disorder due to  general medical condition  Collaboration of Care: Other none needed at this session patient continues to see psychiatrist Dr. Sandria Manly for medication management  Patient/Guardian was advised Release of Information must be obtained prior  to any record release in order to collaborate their care with an outside provider. Patient/Guardian was advised if they have not already done so to contact the registration department to sign all necessary forms in order for [place] to release information regarding their care.   Consent: Patient/Guardian gives verbal consent for treatment and assignment of benefits for services provided during this visit. Patient/Guardian expressed understanding and agreed to proceed.      Adah Salvage, LCSW 03/11/2023   Katie Manthe E Taylin Leder LCSW

## 2023-03-25 ENCOUNTER — Ambulatory Visit (INDEPENDENT_AMBULATORY_CARE_PROVIDER_SITE_OTHER): Payer: 59 | Admitting: Psychiatry

## 2023-03-25 DIAGNOSIS — F410 Panic disorder [episodic paroxysmal anxiety] without agoraphobia: Secondary | ICD-10-CM

## 2023-03-25 DIAGNOSIS — S069XAS Unspecified intracranial injury with loss of consciousness status unknown, sequela: Secondary | ICD-10-CM

## 2023-03-25 DIAGNOSIS — F0630 Mood disorder due to known physiological condition, unspecified: Secondary | ICD-10-CM

## 2023-03-25 NOTE — Progress Notes (Signed)
Virtual Visit via Video Note  I connected with Katie Robbins on 03/25/23 at 3:10 PM EST by a video enabled telemedicine application and verified that I am speaking with the correct person using two identifiers.  Location: Patient: Home Provider: [facility] [place] office    I discussed the limitations of evaluation and management by telemedicine and the availability of in person appointments. The patient expressed understanding and agreed to proceed.   I provided 45 minutes of non-face-to-face time during this encounter.   Katie Salvage, LCSW   THERAPIST PROGRESS NOTE  Session Time: Monday  03/25/2023 3:10 PM -  3:55 PM   Participation Level: Active  Behavioral Response: Alert/Anxious/Talkative/tangentiality  Type of Therapy: Individual Therapy  Treatment Goals addressed: Reduce worry, panic when interacting with others AEB pt reducing intensity of panic and nervousness from an 8 to 4 per ( 10 pt scale)pt's report Learn and implement relaxation techniques, practice daily      Progress on goals: Progressing    Interventions: CBT and Supportive  Summary: Katie Robbins is a 67 y.o. female who initially was referred for services for continuity of care while her previous therapist was on medical leave.  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 She is a returning patient to this clinician and last was seen in July 2023.  Per patient's report, her husband urged her to call as she cannot have a conversation with other people without interrupting.  She reports being irritable and staying manage with her husband for a couple of days if he says the wrong thing.  Patient states being very moody and becoming upset easily over little things.  She also reports crying almost daily.  She reports decreased interest and involvement in activities.  She states not leaving the house or socializing.  Patient states feeling stressed about possibly  saying wrong thing to husband as she fears he will leave her although she states knowing he will not leave.    Patient last was seen via virtual visit about 2 weeks ago.  She reports continued behavioral activation and socialization since last session.  She and her husband have gone out to dinner.  They also have socialized with friends.  Patient reports panic and nervousness regarding interacting with others especially her husband, her family\, and other people she knows has been in consistently at a 4S for several weeks.  She reports her husband and family being aware of her condition and prognosis has been very helpful.  She also reports increased acceptance of her condition along with realistic expectations of self have reduced her anxiety about interacting and talking with others.  She reports much improved communication between she and her husband.  Patient continues to practice relaxation techniques including deep breathing, listening to music, playing on her tablet, and listening to ocean sounds. Patient is pleased with her progress in treatment.     Therapist Response: reviewed symptoms, praised and reinforced continued behavioral activation and socialization, discussed effects, reviewed goals, discussed patient's accomplishment of goals, discussed stepdown plan to include 1 to 2 more sessions, encouraged patient to continue practicing relaxation techniques as well as continued communication with husband    Diagnosis: Axis I: 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 Sandria Manly for medication management  Patient/Guardian was advised Release of Information must be obtained  prior to any record release in order to collaborate their care with an outside provider. Patient/Guardian was advised if they have not already done so to contact the registration department  to sign all necessary forms in order for [place] to release information regarding their care.   Consent: Patient/Guardian gives verbal consent for treatment and assignment of benefits for services provided during this visit. Patient/Guardian expressed understanding and agreed to proceed.      Katie Salvage, LCSW 03/25/2023   Katie Robbins Katie Versia Mignogna LCSW

## 2023-03-29 ENCOUNTER — Telehealth: Payer: Self-pay | Admitting: Primary Care

## 2023-03-29 DIAGNOSIS — R1319 Other dysphagia: Secondary | ICD-10-CM

## 2023-03-29 NOTE — Telephone Encounter (Signed)
Patient would like a referral sent out to Dr Allegra Lai her GI doctor.She is having a hard time swallowing and she's having bowel issues.She called and they are saying that she needs a referral.

## 2023-03-29 NOTE — Telephone Encounter (Signed)
Noted  Referral placed.

## 2023-04-15 ENCOUNTER — Telehealth: Payer: Self-pay | Admitting: Gastroenterology

## 2023-04-15 ENCOUNTER — Other Ambulatory Visit: Payer: Self-pay | Admitting: Obstetrics and Gynecology

## 2023-04-15 DIAGNOSIS — Z1231 Encounter for screening mammogram for malignant neoplasm of breast: Secondary | ICD-10-CM

## 2023-04-15 NOTE — Telephone Encounter (Signed)
The patient called in to schedule an appointment. I sent out an appointment reminder with a No-Show letter and a map. I verified and updated the information in her chart, including the insurance guarantor and other relevant details. She is aware of her $50 Co-Pay.

## 2023-04-16 ENCOUNTER — Ambulatory Visit
Admission: RE | Admit: 2023-04-16 | Discharge: 2023-04-16 | Disposition: A | Discharge status: Home / Self Care | Payer: 59 | Source: Ambulatory Visit | Attending: Obstetrics and Gynecology | Admitting: Obstetrics and Gynecology

## 2023-04-16 DIAGNOSIS — Z1231 Encounter for screening mammogram for malignant neoplasm of breast: Secondary | ICD-10-CM | POA: Insufficient documentation

## 2023-04-17 ENCOUNTER — Other Ambulatory Visit: Payer: Self-pay

## 2023-04-18 ENCOUNTER — Other Ambulatory Visit: Payer: Self-pay | Admitting: Obstetrics and Gynecology

## 2023-04-18 DIAGNOSIS — R928 Other abnormal and inconclusive findings on diagnostic imaging of breast: Secondary | ICD-10-CM

## 2023-04-19 ENCOUNTER — Other Ambulatory Visit: Payer: Self-pay

## 2023-04-23 ENCOUNTER — Ambulatory Visit: Payer: 59 | Admitting: Obstetrics and Gynecology

## 2023-04-23 DIAGNOSIS — Z79890 Hormone replacement therapy (postmenopausal): Secondary | ICD-10-CM

## 2023-04-23 DIAGNOSIS — Z01419 Encounter for gynecological examination (general) (routine) without abnormal findings: Secondary | ICD-10-CM

## 2023-04-25 ENCOUNTER — Encounter: Payer: Self-pay | Admitting: Gastroenterology

## 2023-04-25 ENCOUNTER — Other Ambulatory Visit: Payer: Self-pay

## 2023-04-25 ENCOUNTER — Ambulatory Visit: Payer: 59 | Admitting: Gastroenterology

## 2023-04-25 ENCOUNTER — Encounter: Payer: Self-pay | Admitting: Obstetrics and Gynecology

## 2023-04-25 VITALS — BP 190/83 | HR 105 | Temp 97.5°F | Ht 63.0 in | Wt 144.4 lb

## 2023-04-25 DIAGNOSIS — R1319 Other dysphagia: Secondary | ICD-10-CM

## 2023-04-25 DIAGNOSIS — K5909 Other constipation: Secondary | ICD-10-CM

## 2023-04-25 DIAGNOSIS — K224 Dyskinesia of esophagus: Secondary | ICD-10-CM | POA: Diagnosis not present

## 2023-04-25 DIAGNOSIS — G459 Transient cerebral ischemic attack, unspecified: Secondary | ICD-10-CM | POA: Insufficient documentation

## 2023-04-25 DIAGNOSIS — M5412 Radiculopathy, cervical region: Secondary | ICD-10-CM | POA: Insufficient documentation

## 2023-04-25 DIAGNOSIS — M7918 Myalgia, other site: Secondary | ICD-10-CM | POA: Insufficient documentation

## 2023-04-25 MED ORDER — GI COCKTAIL ~~LOC~~: 20.0000 mL | Freq: Two times a day (BID) | ORAL | 1 refills | Status: AC

## 2023-04-25 NOTE — Patient Instructions (Addendum)
Take 1 bottle of Magnesium Citrate for severe constipation.  Take Miralax 17grams daily with a 8oz glass water,
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
## 2023-05-01 ENCOUNTER — Ambulatory Visit: Payer: 59 | Admitting: Obstetrics and Gynecology

## 2023-05-01 ENCOUNTER — Other Ambulatory Visit: Payer: 59

## 2023-05-01 DIAGNOSIS — Z01419 Encounter for gynecological examination (general) (routine) without abnormal findings: Secondary | ICD-10-CM

## 2023-05-01 DIAGNOSIS — Z79890 Hormone replacement therapy (postmenopausal): Secondary | ICD-10-CM

## 2023-05-09 ENCOUNTER — Ambulatory Visit (HOSPITAL_COMMUNITY): Payer: 59 | Admitting: Psychiatry

## 2023-05-09 DIAGNOSIS — F410 Panic disorder [episodic paroxysmal anxiety] without agoraphobia: Secondary | ICD-10-CM

## 2023-05-09 DIAGNOSIS — S069XAS Unspecified intracranial injury with loss of consciousness status unknown, sequela: Secondary | ICD-10-CM

## 2023-05-09 DIAGNOSIS — F0630 Mood disorder due to known physiological condition, unspecified: Secondary | ICD-10-CM | POA: Diagnosis not present

## 2023-05-09 NOTE — Progress Notes (Signed)
Virtual Visit via Video Note  I connected with Katie Robbins on 05/09/23 at  2:00 PM EST by a video enabled telemedicine application and verified that I am speaking with the correct person using two identifiers.  Location: Patient: Home Provider: [facility] Outpatient [place] office    I discussed the limitations of evaluation and management by telemedicine and the availability of in person appointments. The patient expressed understanding and agreed to proceed.   I provided 43 minutes of non-face-to-face time during this encounter.   Adah Salvage, LCSW    THERAPIST PROGRESS NOTE  Session Time: Thursday  05/09/2023 2:00 PM - 2:43 PM   Participation Level: Active  Behavioral Response: Alert/Anxious/Talkative/tangentiality  Type of Therapy: Individual Therapy  Treatment Goals addressed: Reduce worry, panic when interacting with others AEB pt reducing intensity of panic and nervousness from an 8 to 4 per ( 10 pt scale)pt's report Learn and implement relaxation techniques, practice daily      Progress on goals: Progressing    Interventions: CBT and Supportive  Summary: Katie Robbins is a 67 y.o. female who initially was referred for services for continuity of care while her previous therapist was on medical leave.  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 She is a returning patient to this clinician and last was seen in July 2023.  Per patient's report, her husband urged her to call as she cannot have a conversation with other people without interrupting.  She reports being irritable and staying manage with her husband for a couple of days if he says the wrong thing.  Patient states being very moody and becoming upset easily over little things.  She also reports crying almost daily.  She reports decreased interest and involvement in activities.  She states not leaving the house or socializing.  Patient states feeling stressed about  possibly saying wrong thing to husband as she fears he will leave her although she states knowing he will not leave.    Patient last was seen via virtual visit about 6-7 weeks ago.  She reports continued socialization since last session and continuing to feel comfortable with interaction with her family and friends. She has had guests at her home. She and husband continue to have positive relationship per her report. She She has been experiencing increased neck pain along with headaches. She is working with her doctor and is waiting to receive authorization for a CT scan. Pt reports decreased involvement in activities like light housekeeping due to the pain and increased sleep difficulty. She states mainly watching TV during the day and trying to pace self to do small tasks. She reports having realistic expectations of self but also having support from husband to tell her when she may be doing too much. She also reports husband has been very supportive in doing more household tasks. Pt states she has continued working on staying calm. She reports continued support from her children and increased contact with her son.  Pt is pleased with her progress in therapy.   Therapist Response: reviewed symptoms, praised and reinforced continued behavioral activation and socialization, praised and reinforce pt having more realistic expectations of self/pacing self, discussed  plan for termination to include  2 more sessions  Diagnosis: Axis I: 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 Sandria Manly for medication management  Patient/Guardian was advised Release of Information must be obtained prior to any record release in order to collaborate their care with an outside provider. Patient/Guardian was advised if they have not already done so to contact the registration department to  sign all necessary forms in order for [place] to release information regarding their care.   Consent: Patient/Guardian gives verbal consent for treatment and assignment of benefits for services provided during this visit. Patient/Guardian expressed understanding and agreed to proceed.      Adah Salvage, LCSW 05/09/2023   Lanora Reveron E Iris Hairston LCSW

## 2023-05-10 ENCOUNTER — Other Ambulatory Visit: Payer: Self-pay | Admitting: Neurosurgery

## 2023-05-10 DIAGNOSIS — M47812 Spondylosis without myelopathy or radiculopathy, cervical region: Secondary | ICD-10-CM

## 2023-05-13 ENCOUNTER — Ambulatory Visit
Admission: RE | Admit: 2023-05-13 | Discharge: 2023-05-13 | Disposition: A | Discharge status: Home / Self Care | Payer: 59 | Source: Ambulatory Visit | Attending: Neurosurgery | Admitting: Neurosurgery

## 2023-05-13 DIAGNOSIS — M47812 Spondylosis without myelopathy or radiculopathy, cervical region: Secondary | ICD-10-CM

## 2023-05-15 DIAGNOSIS — N3010 Interstitial cystitis (chronic) without hematuria: Secondary | ICD-10-CM

## 2023-05-15 DIAGNOSIS — R3989 Other symptoms and signs involving the genitourinary system: Secondary | ICD-10-CM

## 2023-05-15 NOTE — Telephone Encounter (Signed)
 Katie Robbins, can we call patient to coordinate for her to come by for POC urinalysis on 05/16/23? I will place orders.

## 2023-05-16 NOTE — Telephone Encounter (Signed)
 Called patient back and got her on the schedule for lab appt tomorrow 05/17/23

## 2023-05-16 NOTE — Telephone Encounter (Signed)
 Copied from CRM [id number]. Topic: Clinical - Request for Lab/Test Order >> May 16, 2023  1:20 PM Rosina BIRCH wrote: Reason for CRM: Pt called stating Keli wanted her to come in for an lab appointment but she is not able to come in today,but she can come in tomorrow after her mammogram. Pt wants to pick up a specimen cup from the office tomorrow because she states she is not having urinary symptoms

## 2023-05-16 NOTE — Telephone Encounter (Signed)
 Unable to reach patient. Left voicemail to return call to our office.

## 2023-05-17 ENCOUNTER — Other Ambulatory Visit (INDEPENDENT_AMBULATORY_CARE_PROVIDER_SITE_OTHER): Payer: Medicare Other

## 2023-05-17 ENCOUNTER — Ambulatory Visit
Admission: RE | Admit: 2023-05-17 | Discharge: 2023-05-17 | Disposition: A | Discharge status: Home / Self Care | Payer: Medicare Other | Source: Ambulatory Visit | Attending: Obstetrics and Gynecology | Admitting: Obstetrics and Gynecology

## 2023-05-17 ENCOUNTER — Other Ambulatory Visit: Payer: Self-pay | Admitting: Obstetrics and Gynecology

## 2023-05-17 DIAGNOSIS — R3989 Other symptoms and signs involving the genitourinary system: Secondary | ICD-10-CM

## 2023-05-17 DIAGNOSIS — R928 Other abnormal and inconclusive findings on diagnostic imaging of breast: Secondary | ICD-10-CM | POA: Insufficient documentation

## 2023-05-17 DIAGNOSIS — N3010 Interstitial cystitis (chronic) without hematuria: Secondary | ICD-10-CM

## 2023-05-17 LAB — POC URINALSYSI DIPSTICK (AUTOMATED)
Bilirubin, UA: NEGATIVE
Blood, UA: POSITIVE
Glucose, UA: NEGATIVE
Ketones, UA: NEGATIVE
Leukocytes, UA: NEGATIVE
Nitrite, UA: NEGATIVE
Protein, UA: NEGATIVE
Spec Grav, UA: 1.01 (ref 1.010–1.025)
Urobilinogen, UA: 0.2 U/dL
pH, UA: 6 (ref 5.0–8.0)

## 2023-05-17 NOTE — Telephone Encounter (Signed)
 Noted.

## 2023-05-18 LAB — URINE CULTURE
MICRO NUMBER:: 15915060
Result:: NO GROWTH
SPECIMEN QUALITY:: ADEQUATE

## 2023-05-20 ENCOUNTER — Other Ambulatory Visit: Payer: Self-pay | Admitting: Cardiology

## 2023-05-20 ENCOUNTER — Other Ambulatory Visit: Payer: Self-pay | Admitting: Internal Medicine

## 2023-05-20 DIAGNOSIS — E039 Hypothyroidism, unspecified: Secondary | ICD-10-CM

## 2023-05-20 NOTE — Telephone Encounter (Signed)
 Copied from CRM [id number]. Topic: Clinical - Medication Refill >> May 20, 2023  1:26 PM Evie B wrote: Most Recent Primary Care Visit:  Provider: CLARK, KATHERINE K  Department: [facility]  Visit Type: PHYSICAL  Date: 12/19/2022  Medication: levothyroxine  (SYNTHROID ) 112 MCG tablet  Has the patient contacted their pharmacy? Yes (Agent: If no, request that the patient contact the pharmacy for the refill. If patient does not wish to contact the pharmacy document the reason why and proceed with request.) (Agent: If yes, when and what did the pharmacy advise?)  Is this the correct pharmacy for this prescription? Yes If no, delete pharmacy and type the correct one.  This is the patient's preferred pharmacy:  [facility] - [place], [place] - [address] [place] [address] Phone: [phone] Fax: [phone]   Has the prescription been filled recently? Yes  Is the patient out of the medication? Yes  Has the patient been seen for an appointment in the last year OR does the patient have an upcoming appointment? Yes  Can we respond through MyChart? No  Agent: Please be advised that Rx refills may take up to 3 business days. We ask that you follow-up with your pharmacy.

## 2023-05-21 ENCOUNTER — Ambulatory Visit (INDEPENDENT_AMBULATORY_CARE_PROVIDER_SITE_OTHER): Payer: Medicare Other | Admitting: Obstetrics and Gynecology

## 2023-05-21 ENCOUNTER — Encounter: Payer: Self-pay | Admitting: Obstetrics and Gynecology

## 2023-05-21 VITALS — BP 156/84 | HR 103 | Ht 63.0 in | Wt 146.6 lb

## 2023-05-21 DIAGNOSIS — Z01419 Encounter for gynecological examination (general) (routine) without abnormal findings: Secondary | ICD-10-CM | POA: Diagnosis not present

## 2023-05-21 DIAGNOSIS — Z79890 Hormone replacement therapy (postmenopausal): Secondary | ICD-10-CM

## 2023-05-21 DIAGNOSIS — N952 Postmenopausal atrophic vaginitis: Secondary | ICD-10-CM

## 2023-05-21 MED ORDER — LEVOTHYROXINE SODIUM 112 MCG PO TABS: 112.0000 ug | ORAL_TABLET | ORAL | 3 refills | Status: DC

## 2023-05-21 MED ORDER — ESTRADIOL 0.1 MG/GM VA CREA: 0.2500 | TOPICAL_CREAM | Freq: Every day | VAGINAL | 3 refills | Status: AC

## 2023-05-21 MED ORDER — ESTRADIOL 1 MG PO TABS: 1.0000 mg | ORAL_TABLET | Freq: Every day | ORAL | 11 refills | Status: DC

## 2023-05-21 NOTE — Progress Notes (Signed)
 HPI:      [name] is a 68 y.o. [phone] who LMP was No LMP recorded. Patient has had a hysterectomy.  Subjective:   She presents today for her annual examination.  She states that she is not having any specific problems.  She is sexually active with her husband.  She occasionally gets a small cracks on the labia after wiping too hard or sex.  In the past she has used some estrogen cream on them and found they resolved quickly.  She would like to again try estrogen cream on these areas. She continues to take oral estrogen and would like to stay on it. No significant note she has previously had a hysterectomy.    Hx: The following portions of the patient's history were reviewed and updated as appropriate:             She  has a past medical history of Acute thoracic back pain (07/22/2018), ADHD (attention deficit hyperactivity disorder), Anemia, Anxiety, Arthritis, Asthma, Chicken pox, Chronic headaches, Chronic kidney disease, Diverticulitis, Essential hypertension, GERD (gastroesophageal reflux disease), Guillain Barr syndrome (HCC) (1982), H/O breast implant, Heart murmur, Hypertension, Hypothyroidism, Interstitial cystitis, Interstitial cystitis, Mitral prolapse (1985), Non-cardiac chest pain (06/13/2020), Nutcracker esophagus, Osteoarthritis, Soft tissue mass (03/31/2020), and Stroke (HCC). She does not have any pertinent problems on file. She  has a past surgical history that includes Cholecystectomy (2012); Appendectomy (2007); Abdominal hysterectomy (2000/2009); Tonsillectomy; Small intestine surgery (2007); Cardiac catheterization (2016); Vesico-vaginal fistula repair (2007); Neck surgery (06/11/2016); Carpal tunnel release (Right); Bowel resection (2007); Colonoscopy with propofol  (N/A, 04/13/2020); Esophagogastroduodenoscopy (egd) with propofol  (N/A, 04/13/2020); Augmentation mammaplasty; Blepharoptosis repair (Bilateral); Posterior cervical laminectomy (Bilateral, 08/16/2022);  and Bilateral Cervical Two ganglionectomies (08/2022). Her family history includes COPD in her mother; GER disease in her father and mother; Heart attack in her father; Hypertension in her mother; Osteoarthritis in her mother; Osteoporosis in her mother; Other in her mother; Polymyalgia rheumatica in her mother; Spondylolysis in her father. She  reports that she has never smoked. She has never used smokeless tobacco. She reports current alcohol  use of about 3.0 standard drinks of alcohol  per week. She reports that she does not use drugs. She has a current medication list which includes the following prescription(s): albuterol , gi cocktail, amlodipine , amphetamine -dextroamphetamine , aspirin  ec, budesonide -formoterol , cyanocobalamin , diazepam , epinephrine , estradiol , levothyroxine , levothyroxine , olmesartan , pantoprazole , PRESCRIPTION MEDICATION, and estradiol . She is allergic to bactrim [sulfamethoxazole-trimethoprim], macrobid [nitrofurantoin macrocrystal], tizanidine, vicodin [hydrocodone-acetaminophen ], trileptal  [oxcarbazepine ], abilify [aripiprazole], acetaminophen , amaryl [glimepiride], ambien  [zolpidem ], cyclobenzaprine , erythromycin base, gabapentin, hydrocodone, klonopin [clonazepam], nexium [esomeprazole magnesium], nortriptyline, olanzapine, pregabalin, prilosec [omeprazole], quetiapine, reglan  [metoclopramide ], savella [milnacipran], sulfa antibiotics, tamoxifen, tapentadol, topiramate, tramadol, trimethoprim, wasp venom, zoloft  [sertraline ], erythromycin, lamotrigine , latex, and molds & smuts.       Review of Systems:  Review of Systems  Constitutional: Denied constitutional symptoms, night sweats, recent illness, fatigue, fever, insomnia and weight loss.  Eyes: Denied eye symptoms, eye pain, photophobia, vision change and visual disturbance.  Ears/Nose/Throat/Neck: Denied ear, nose, throat or neck symptoms, hearing loss, nasal discharge, sinus congestion and sore throat.  Cardiovascular:  Denied cardiovascular symptoms, arrhythmia, chest pain/pressure, edema, exercise intolerance, orthopnea and palpitations.  Respiratory: Denied pulmonary symptoms, asthma, pleuritic pain, productive sputum, cough, dyspnea and wheezing.  Gastrointestinal: Denied, gastro-esophageal reflux, melena, nausea and vomiting.  Genitourinary: Denied genitourinary symptoms including symptomatic vaginal discharge, pelvic relaxation issues, and urinary complaints.  Musculoskeletal: Denied musculoskeletal symptoms, stiffness, swelling, muscle weakness and myalgia.  Dermatologic: Denied dermatology symptoms, rash and scar.  Neurologic: Denied  neurology symptoms, dizziness, headache, neck pain and syncope.  Psychiatric: Denied psychiatric symptoms, anxiety and depression.  Endocrine: Denied endocrine symptoms including hot flashes and night sweats.   Meds:   Current Outpatient Medications on File Prior to Visit  Medication Sig Dispense Refill   albuterol  (VENTOLIN  HFA) 108 (90 Base) MCG/ACT inhaler INHALE TWO PUFFS INTO THE LUNGS EVERY SIX HOURS AS NEEDED FOR WHEEZING OR SHORTNESS OF BREATH. 8.5 g 0   Alum & Mag Hydroxide-Simeth (GI COCKTAIL) SUSP suspension Take 20 mLs by mouth 2 (two) times daily. Maalox 30 mL, lidocaine  5 mL and dicyclomine 10 mL, dispense 450 mL,  SIG take 15 to 20 mL as needed 3-4 times daily. 1200 mL 1   amLODipine  (NORVASC ) 5 MG tablet TAKE ONE TABLET BY MOUTH ONCE DAILY FOR BLOOD PRESSURE 90 tablet 2   amphetamine -dextroamphetamine  (ADDERALL) 20 MG tablet Take 20 mg by mouth in the morning, at noon, and at bedtime.     aspirin  EC 81 MG tablet Take 1 tablet (81 mg total) by mouth in the morning. 30 tablet 12   budesonide -formoterol  (SYMBICORT ) 160-4.5 MCG/ACT inhaler Inhale 2 puffs into the lungs 2 (two) times daily. 10.2 g 4   cyanocobalamin  (VITAMIN B12) 1000 MCG/ML injection INJECT ONE VIAL (1 ML) INTO THE MUSCLE EVERY THREE TO FOUR WEEKS 12 mL 1   diazepam  (VALIUM ) 5 MG tablet Take 5  mg by mouth 4 (four) times daily as needed for anxiety.     EPINEPHrine  0.3 mg/0.3 mL IJ SOAJ injection Inject 0.3 mLs (0.3 mg total) into the muscle as needed for anaphylaxis. 1 each 0   levothyroxine  (SYNTHROID ) 100 MCG tablet TAKE ONE TABLET BY MOUTH MONDAY THROUGH SATURDAYS 72 tablet 1   levothyroxine  (SYNTHROID ) 112 MCG tablet Take 1 tablet (112 mcg total) by mouth as directed. 1 tablet on Sundays 12 tablet 3   olmesartan  (BENICAR ) 20 MG tablet TAKE ONE TABLET (20 MG TOTAL) BY MOUTH DAILY. 90 tablet 1   pantoprazole  (PROTONIX ) 40 MG tablet TAKE ONE TABLET (40 MG TOTAL) BY MOUTH DAILY. FOR HEARTBURN. 90 tablet 3   PRESCRIPTION MEDICATION Take 15 mLs by mouth daily as needed (Esophageal Spasm). 450 Gi cocktail  Bentyl     No current facility-administered medications on file prior to visit.     Objective:     Vitals:   05/21/23 1502 05/21/23 1515  BP: (!) 161/80 (!) 156/84  Pulse: (!) 103     Filed Weights   05/21/23 1502  Weight: 146 lb 9.6 oz (66.5 kg)              She has deferred examination today.  Assessment:    [id number] Patient Active Problem List   Diagnosis Date Noted   Cervical radiculopathy 04/25/2023   Myofascial pain 04/25/2023   Transient ischemic attack 04/25/2023   Non-cardiac chest pain 01/17/2023   Mitral valve prolapse 01/17/2023   Imbalance 12/19/2022   Dizziness 07/12/2022   AKI (acute kidney injury) (HCC) 07/12/2022   Precordial pain 05/30/2022   Bladder pain 05/23/2022   Electrocution 04/17/2022   Lower extremity weakness 12/12/2021   Seizure-like activity (HCC) 07/03/2021   Esophageal spasm 12/09/2020   History of depression 08/16/2020   Sleep disorder, unspecified 08/16/2020   Esophageal dysphagia    Body mass index (BMI) 27.0-27.9, adult 10/22/2019   CKD (chronic kidney disease) stage 3, GFR 30-59 ml/min (HCC) 06/30/2019   Vitamin B 12 deficiency 01/27/2019   Panic disorder 12/29/2018   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) 08/07/2018   Mild neurocognitive disorder due to traumatic brain injury [facility]) 08/07/2018   Neuropathy 07/22/2018   Prediabetes 07/08/2018   Numbness and tingling 07/08/2018   Environmental and seasonal allergies 04/24/2018   Chronic fatigue 01/23/2018   Ataxia 07/16/2017   Primary osteoarthritis of left knee 05/29/2017   S/P carpal tunnel release 05/29/2017   Primary osteoarthritis of both hands 05/29/2017   History of bilateral breast implants 05/29/2017   Interstitial cystitis 05/29/2017   History of mitral valve prolapse 05/29/2017   History of diverticulitis 05/29/2017   Carpal tunnel syndrome 05/16/2016   Stenosis of intervertebral foramina 05/16/2016   Degeneration of intervertebral disc of cervical spine without prolapsed disc 05/01/2016   Cervical disc disorder with radiculopathy of cervical region 04/12/2016   Preventative health care 11/28/2015   Esophageal reflux 08/19/2015   Essential hypertension 08/19/2015   Hypothyroidism 08/19/2015   Asthma 08/19/2015   Chronic headaches 08/19/2015   Estrogen deficiency 08/19/2015   Nutcracker esophagus 08/12/2005     1. Well woman exam with routine gynecological exam   2. Hormone replacement therapy (HRT)   3. Vaginal atrophy        Plan:            1.  Basic Screening Recommendations The basic screening recommendations for asymptomatic women were discussed with the patient during her visit.  The age-appropriate recommendations were discussed with her and the rational for the tests reviewed.  When I am informed by the patient that another primary care physician has previously obtained the age-appropriate tests and they are up-to-date, only outstanding tests are ordered and referrals given as necessary.  Abnormal results of tests will be discussed with her when all of her results are completed.  Routine preventative health maintenance measures emphasized: Exercise/Diet/Weight control, Tobacco Warnings,  Alcohol /Substance use risks and Stress Management 2.  Continue ERT 3.  Will add estrogen vaginal cream and see if her labial cracks are improved with this cream. Orders No orders of the defined types were placed in this encounter.    Meds ordered this encounter  Medications   estradiol  (ESTRACE ) 1 MG tablet    Sig: Take 1 tablet (1 mg total) by mouth daily.    Dispense:  30 tablet    Refill:  11   estradiol  (ESTRACE ) 0.1 MG/GM vaginal cream    Sig: Place 0.25 Applicatorfuls vaginally at bedtime.    Dispense:  90 g    Refill:  3          F/U  No follow-ups on file.  Alm DOROTHA Sar, M.D. 05/21/2023 5:00 PM

## 2023-05-21 NOTE — Telephone Encounter (Addendum)
 Last office visit: 01/16/23 with plan to f/u in 12 months.   Next visit: none/active recall

## 2023-05-21 NOTE — Progress Notes (Signed)
 Patients presents for annual exam today. She states continuing to take daily HRT, would like to discuss vaginal estrogen. History of hysterectomy. Mammogram and labs are up to date. She states no other questions or concerns at this time.

## 2023-05-22 ENCOUNTER — Ambulatory Visit (INDEPENDENT_AMBULATORY_CARE_PROVIDER_SITE_OTHER): Payer: Self-pay | Admitting: Internal Medicine

## 2023-05-22 ENCOUNTER — Encounter: Payer: Self-pay | Admitting: Internal Medicine

## 2023-05-22 VITALS — BP 138/84 | HR 66 | Wt 139.0 lb

## 2023-05-22 DIAGNOSIS — E039 Hypothyroidism, unspecified: Secondary | ICD-10-CM

## 2023-05-22 NOTE — Patient Instructions (Signed)

## 2023-05-22 NOTE — Progress Notes (Signed)
 Name: Katie Robbins  MRN/ DOB: [id number], Jul 20, 1955    Age/ Sex: 68 y.o., female    PCP: Gretta Comer POUR, NP   Reason for Endocrinology Evaluation: Hypothyroidism     Date of Initial Endocrinology Evaluation: 12/06/2021    HPI: Katie Robbins is a 68 y.o. female with a past medical history of HTN, asthma, hypothyroid, neuropathy,Hx Guillain-Barr syndrome (1982). The patient presented for initial endocrinology clinic visit on 12/06/2021  for consultative assistance with her Hypothyroidism.   She has been on levothyroxine  1994   Denies prior exposure to radiation and thyroid  sx   Over the years she has had fluctuating TSH ranging from 0.11 uIU/mL in 07/2021 to 7.05 uIU/mL in 08/2019    Mother with PMR , giant cell arteritis and thyroid  disease   She has had neck surgery in 2018  SUBJECTIVE:    Today (05/22/23):  Katie Robbins is here for a follow up on Hypothyroidism.  She continues with multiple neurological symptoms, Dx with GBS , follows with neurology   She associates her levothyroxine  dose with GBS.  Sees Psychiatry for addrall and valium     She is s/p ganglionectomy's due to bilateral occipital neuralgia, medically refractory, continues with occipital pain and neck pain  She had stretching through PT but this did not help followed by pain and incontinence   Weight continues to fluctuate  She has noted some difficulty swallowing , pending EGD, hx of prior esophageal dilatation  Denies palpitations  Has chronic  constipation - on miralax  Uses pill box  No Biotin  Has noted hair growth improvement with biotin shampoo    Continue levothyroxine  100 mcg x 6 days a week Monday through Saturday  Continue levothyroxine  112 mcg x 1 days  week Sunday     HISTORY:  Past Medical History:  Past Medical History:  Diagnosis Date   Acute thoracic back pain 07/22/2018   ADHD (attention deficit hyperactivity disorder)    Anemia    Abnormal Uterine Bleeding -  Resulted in Hysterectomy   Anxiety    Arthritis    Asthma    Chicken pox    Chronic headaches    Chronic kidney disease    CKD Stage 3a. No HD   Diverticulitis    Essential hypertension    GERD (gastroesophageal reflux disease)    Guillain Barr syndrome (HCC) 1982   H/O breast implant    Heart murmur    Hypertension    Hypothyroidism    Interstitial cystitis    Interstitial cystitis    Mitral prolapse 1985   Non-cardiac chest pain 06/13/2020   Nutcracker esophagus    Osteoarthritis    Soft tissue mass 03/31/2020   Stroke (HCC)    Multiple TIAs. Last one Feb. 2024   Past Surgical History:  Past Surgical History:  Procedure Laterality Date   ABDOMINAL HYSTERECTOMY  2000/2009   APPENDECTOMY  2007   AUGMENTATION MAMMAPLASTY     BELPHAROPTOSIS REPAIR Bilateral    Cosmetic. Eye Lift   Bilateral Cervical Two ganglionectomies  08/2022   BOWEL RESECTION  2007   CARDIAC CATHETERIZATION  2016   Clean   CARPAL TUNNEL RELEASE Right    CHOLECYSTECTOMY  2012   COLONOSCOPY WITH PROPOFOL  N/A 04/13/2020   Procedure: COLONOSCOPY WITH PROPOFOL ;  Surgeon: Unk Corinn Skiff, MD;  Location: [facility] ENDOSCOPY;  Service: Gastroenterology;  Laterality: N/A;   ESOPHAGOGASTRODUODENOSCOPY (EGD) WITH PROPOFOL  N/A 04/13/2020   Procedure: ESOPHAGOGASTRODUODENOSCOPY (EGD) WITH PROPOFOL ;  Surgeon: Unk Corinn  Jess, MD;  Location: [facility] ENDOSCOPY;  Service: Gastroenterology;  Laterality: N/A;   NECK SURGERY  06/11/2016   Fusion C3-C7 Dr. Onetha   POSTERIOR CERVICAL LAMINECTOMY Bilateral 08/16/2022   Procedure: Bilateral Cervical Two ganglionectomies;  Surgeon: Cheryle Debby LABOR, MD;  Location: [facility] OR;  Service: Neurosurgery;  Laterality: Bilateral;   SMALL INTESTINE SURGERY  2007   Bowel resection   TONSILLECTOMY     VESICO-VAGINAL FISTULA REPAIR  2007    Social History:  reports that she has never smoked. She has never used smokeless tobacco. She reports current alcohol  use of about 3.0 standard  drinks of alcohol  per week. She reports that she does not use drugs. Family History: family history includes COPD in her mother; GER disease in her father and mother; Heart attack in her father; Hypertension in her mother; Osteoarthritis in her mother; Osteoporosis in her mother; Other in her mother; Polymyalgia rheumatica in her mother; Spondylolysis in her father.   HOME MEDICATIONS: Allergies as of 05/22/2023       Reactions   Bactrim [sulfamethoxazole-trimethoprim] Hives   Macrobid [nitrofurantoin Macrocrystal] Hives   Tizanidine Other (See Comments)   Bad Mood change Migraines   Vicodin [hydrocodone-acetaminophen ] Swelling   Puffy eyes and face   Trileptal  [oxcarbazepine ] Rash   Abilify [aripiprazole] Other (See Comments)   Bad leg cramps   Acetaminophen     Other reaction(s): Puffy face/eyes   Amaryl [glimepiride] Other (See Comments)   Not effective   Ambien  [zolpidem ] Other (See Comments)   Did not work   Cyclobenzaprine  Other (See Comments)   Not effective   Erythromycin Base    Other Reaction(s): Rash   Gabapentin Other (See Comments)   XR makes her Groggy   Hydrocodone Swelling   Eyes and face   Klonopin [clonazepam] Other (See Comments)   Make her feel groggy   Nexium [esomeprazole Magnesium] Nausea And Vomiting   Face swelling, hives    Nortriptyline    insomnia   Olanzapine Other (See Comments)   Not effective   Pregabalin Other (See Comments)   Not effective   Prilosec [omeprazole] Nausea And Vomiting   Face swelling, hives    Quetiapine Other (See Comments)   Groggy   Reglan  [metoclopramide ] Nausea Only   Savella [milnacipran] Other (See Comments)   Not effective   Sulfa Antibiotics Hives   Tamoxifen    Tapentadol Other (See Comments)   Not effective   Topiramate Other (See Comments)   Not effective   Tramadol Other (See Comments)   Not effective   Trimethoprim    Other reaction(s): Hives   Wasp Venom Hives   swelling   Zoloft  [sertraline ]  Other (See Comments)   Not effective   Erythromycin Rash   Lamotrigine  Rash   Bad mood swings   Latex Rash   Molds & Smuts Anxiety   Black Mold        Medication List        Accurate as of May 22, 2023 10:33 AM. If you have any questions, ask your nurse or doctor.          albuterol  108 (90 Base) MCG/ACT inhaler Commonly known as: VENTOLIN  HFA INHALE TWO PUFFS INTO THE LUNGS EVERY SIX HOURS AS NEEDED FOR WHEEZING OR SHORTNESS OF BREATH.   amLODipine  5 MG tablet Commonly known as: NORVASC  TAKE ONE TABLET BY MOUTH ONCE DAILY FOR BLOOD PRESSURE   amphetamine -dextroamphetamine  20 MG tablet Commonly known as: ADDERALL Take 20 mg by mouth in the  morning, at noon, and at bedtime.   aspirin  EC 81 MG tablet Take 1 tablet (81 mg total) by mouth in the morning.   budesonide -formoterol  160-4.5 MCG/ACT inhaler Commonly known as: Symbicort  Inhale 2 puffs into the lungs 2 (two) times daily.   cyanocobalamin  1000 MCG/ML injection Commonly known as: VITAMIN B12 INJECT ONE VIAL (1 ML) INTO THE MUSCLE EVERY THREE TO FOUR WEEKS   diazepam  5 MG tablet Commonly known as: VALIUM  Take 5 mg by mouth 4 (four) times daily as needed for anxiety.   EPINEPHrine  0.3 mg/0.3 mL Soaj injection Commonly known as: EPI-PEN Inject 0.3 mLs (0.3 mg total) into the muscle as needed for anaphylaxis.   estradiol  0.1 MG/GM vaginal cream Commonly known as: ESTRACE  Place 0.25 Applicatorfuls vaginally at bedtime.   estradiol  1 MG tablet Commonly known as: ESTRACE  Take 1 tablet (1 mg total) by mouth daily.   gi cocktail Susp suspension Take 20 mLs by mouth 2 (two) times daily. Maalox 30 mL, lidocaine  5 mL and dicyclomine 10 mL, dispense 450 mL,  SIG take 15 to 20 mL as needed 3-4 times daily.   levothyroxine  100 MCG tablet Commonly known as: SYNTHROID  TAKE ONE TABLET BY MOUTH MONDAY THROUGH SATURDAYS   levothyroxine  112 MCG tablet Commonly known as: SYNTHROID  Take 1 tablet (112 mcg total)  by mouth as directed. 1 tablet on Sundays   Medrol  4 MG Tbpk tablet Generic drug: methylPREDNISolone  Take by mouth.   olmesartan  20 MG tablet Commonly known as: BENICAR  TAKE ONE TABLET (20 MG TOTAL) BY MOUTH DAILY.   pantoprazole  40 MG tablet Commonly known as: PROTONIX  TAKE ONE TABLET (40 MG TOTAL) BY MOUTH DAILY. FOR HEARTBURN.   PRESCRIPTION MEDICATION Take 15 mLs by mouth daily as needed (Esophageal Spasm). 450 Gi cocktail  Bentyl          REVIEW OF SYSTEMS: A comprehensive ROS was conducted with the patient and is negative except as per HPI    OBJECTIVE:  VS: BP 138/84 (BP Location: Left Arm, Patient Position: Sitting, Cuff Size: Normal)   Pulse 66   Wt 139 lb (63 kg) Comment: patient reported  SpO2 96%   BMI 24.62 kg/m   Wt Readings from Last 3 Encounters:  05/22/23 139 lb (63 kg)  05/21/23 146 lb 9.6 oz (66.5 kg)  04/25/23 144 lb 6 oz (65.5 kg)     EXAM: General: Pt appears well and is in NAD  Neck: General: Supple without adenopathy. Thyroid : Thyroid  size normal.  No goiter or nodules appreciated.  Lungs: Clear with good BS bilat   Heart: Auscultation: RRR.  Abdomen: soft, nontender  Extremities:  BL LE: No pretibial edema   Mental Status: Judgment, insight: Intact Orientation: Oriented to time, place, and person Mood and affect: No depression, anxiety, or agitation     DATA REVIEWED:  Latest Reference Range & Units 05/22/23 11:09  TSH 0.40 - 4.50 mIU/L 0.50  T4,Free(Direct) 0.8 - 1.8 ng/dL 1.2      ASSESSMENT/PLAN/RECOMMENDATIONS:   Hypothyroidism:  -Patient is complaining of weight gain and difficulty losing the weight -No local neck symptoms -She feels better when her TSH is within mid range -TSH within normal range, no change   Medications : Continue levothyroxine  100 mcg x 6 days a week Continue levothyroxine  112 mcg x  1 day a week (Sunday)   Follow-up in 6 months   Signed electronically by: Stefano Redgie Butts,  MD  [facility] Endocrinology  [facility] [address] [place]  [address] [place], [place] [address] Phone: [phone] FAX: [phone]   CC: Gretta Comer POUR, NP [address] [place] [address] Phone: [phone] Fax: [phone]   Return to Endocrinology clinic as below: Future Appointments  Date Time Provider Department Center  05/23/2023  2:00 PM Dante Winton [place], LCSW [facility] None  06/28/2023 11:00 AM Bynum, Peggy E, LCSW [facility] None

## 2023-05-23 ENCOUNTER — Encounter: Payer: Self-pay | Admitting: Internal Medicine

## 2023-05-23 ENCOUNTER — Ambulatory Visit (INDEPENDENT_AMBULATORY_CARE_PROVIDER_SITE_OTHER): Payer: Medicare Other | Admitting: Psychiatry

## 2023-05-23 DIAGNOSIS — F0630 Mood disorder due to known physiological condition, unspecified: Secondary | ICD-10-CM | POA: Diagnosis not present

## 2023-05-23 DIAGNOSIS — S069XAS Unspecified intracranial injury with loss of consciousness status unknown, sequela: Secondary | ICD-10-CM | POA: Diagnosis not present

## 2023-05-23 LAB — T4, FREE: Free T4: 1.2 ng/dL (ref 0.8–1.8)

## 2023-05-23 LAB — TSH: TSH: 0.5 m[IU]/L (ref 0.40–4.50)

## 2023-05-23 NOTE — Progress Notes (Signed)
 Virtual Visit via Video Note  I connected with Katie Robbins on 05/23/23 at 2:09 PM EST  by a video enabled telemedicine application and verified that I am speaking with the correct person using two identifiers.  Location: Patient: Home Provider: [facility] [place] office    I discussed the limitations of evaluation and management by telemedicine and the availability of in person appointments. The patient expressed understanding and agreed to proceed.    I provided 45 minutes of non-face-to-face time during this encounter.   Winton FORBES Rubinstein, LCSW     THERAPIST PROGRESS NOTE  Session Time: Thursday  05/23/2023  2:09 PM - 2:54 PM   Participation Level: Active  Behavioral Response: Alert/Anxious/Talkative/tangentiality  Type of Therapy: Individual Therapy  Treatment Goals addressed: Reduce worry, panic when interacting with others AEB pt reducing intensity of panic and nervousness from an 8 to 4 per ( 10 pt scale)pt's report Learn and implement relaxation techniques, practice daily      Progress on goals: Progressing    Interventions: CBT and Supportive  Summary: Katie Robbins is a 68 y.o. female who initially was referred for services for continuity of care while her previous therapist was on medical leave.  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 She is a returning patient to this clinician and last was seen in July 2023.  Per patient's report, her husband urged her to call as she cannot have a conversation with other people without interrupting.  She reports being irritable and staying manage with her husband for a couple of days if he says the wrong thing.  Patient states being very moody and becoming upset easily over little things.  She also reports crying almost daily.  She reports decreased interest and involvement in activities.  She states not leaving the house or socializing.  Patient states feeling stressed about  possibly saying wrong thing to husband as she fears he will leave her although she states knowing he will not leave.    Patient last was seen via virtual visit about 2 weeks ago.  She reports increased neck and head pain since last session. Per her report, she had a CT scan and her doctor will discuss results with her and husband tomorrow. This increased pain has triggered increased sleep difficulty and fatigue as well as decreased involvement in activity. Pt reports acceptance of her health at this time and reports realistic expectations of self. She also is hopeful about tomorrow's appt and expresses confidence in her medical provider. She also reports using her spirituality/faith as a tool. She denies experiencing anxiety about CT results. She reports continued strong support from husband and is still  looking forward to she and her husband building a new home.  Pt remains pleased with her progress in therapy,  Therapist Response: reviewed symptoms, praised and reinforced pt having more realistic expectations of self/pacing self/ use of coping statements and her spirituality/faithi, discussed  plan for termination to include 1 more session  Diagnosis: Axis I: 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 Maurice for medication management  Patient/Guardian was advised Release of Information must be obtained prior to any record release in order to collaborate their care with an outside provider. Patient/Guardian was advised if they have not already done so to contact the registration department to sign all  necessary forms in order for us  to release information regarding their care.   Consent: Patient/Guardian gives verbal consent for treatment and assignment of benefits for services provided during this visit. Patient/Guardian expressed understanding and agreed to  proceed.      Winton FORBES Rubinstein, LCSW 05/23/2023   Nicollette Wilhelmi E Shahin Knierim LCSW

## 2023-05-29 ENCOUNTER — Other Ambulatory Visit: Payer: Self-pay | Admitting: Neurosurgery

## 2023-05-29 DIAGNOSIS — M47812 Spondylosis without myelopathy or radiculopathy, cervical region: Secondary | ICD-10-CM

## 2023-05-30 ENCOUNTER — Encounter: Payer: Self-pay | Admitting: Gastroenterology

## 2023-05-31 ENCOUNTER — Ambulatory Visit
Admission: RE | Admit: 2023-05-31 | Discharge: 2023-05-31 | Disposition: A | Discharge status: Home / Self Care | Payer: Medicare Other | Source: Ambulatory Visit | Attending: Neurosurgery | Admitting: Neurosurgery

## 2023-05-31 DIAGNOSIS — M47812 Spondylosis without myelopathy or radiculopathy, cervical region: Secondary | ICD-10-CM

## 2023-05-31 MED ORDER — GADOPICLENOL 0.5 MMOL/ML IV SOLN
7.5000 mL | Freq: Once | INTRAVENOUS | Status: AC | PRN
  Administered 2023-05-31: 6 mL via INTRAVENOUS

## 2023-06-06 ENCOUNTER — Encounter
Admission: RE | Disposition: A | Discharge status: Home / Self Care | Payer: Self-pay | Source: Ambulatory Visit | Attending: Gastroenterology

## 2023-06-06 ENCOUNTER — Ambulatory Visit
Admission: RE | Admit: 2023-06-06 | Discharge: 2023-06-06 | Disposition: A | Discharge status: Home / Self Care | Payer: Medicare Other | Source: Ambulatory Visit | Attending: Gastroenterology | Admitting: Gastroenterology

## 2023-06-06 ENCOUNTER — Ambulatory Visit: Payer: Medicare Other | Admitting: Certified Registered Nurse Anesthetist

## 2023-06-06 ENCOUNTER — Encounter: Payer: Self-pay | Admitting: Gastroenterology

## 2023-06-06 ENCOUNTER — Other Ambulatory Visit: Payer: Self-pay

## 2023-06-06 ENCOUNTER — Ambulatory Visit (HOSPITAL_COMMUNITY): Payer: 59 | Admitting: Psychiatry

## 2023-06-06 DIAGNOSIS — E039 Hypothyroidism, unspecified: Secondary | ICD-10-CM | POA: Insufficient documentation

## 2023-06-06 DIAGNOSIS — I129 Hypertensive chronic kidney disease with stage 1 through stage 4 chronic kidney disease, or unspecified chronic kidney disease: Secondary | ICD-10-CM | POA: Insufficient documentation

## 2023-06-06 DIAGNOSIS — Z8673 Personal history of transient ischemic attack (TIA), and cerebral infarction without residual deficits: Secondary | ICD-10-CM | POA: Diagnosis not present

## 2023-06-06 DIAGNOSIS — R1319 Other dysphagia: Secondary | ICD-10-CM

## 2023-06-06 DIAGNOSIS — R1310 Dysphagia, unspecified: Secondary | ICD-10-CM | POA: Diagnosis not present

## 2023-06-06 DIAGNOSIS — N1831 Chronic kidney disease, stage 3a: Secondary | ICD-10-CM | POA: Diagnosis not present

## 2023-06-06 DIAGNOSIS — R1314 Dysphagia, pharyngoesophageal phase: Secondary | ICD-10-CM | POA: Diagnosis present

## 2023-06-06 DIAGNOSIS — K219 Gastro-esophageal reflux disease without esophagitis: Secondary | ICD-10-CM | POA: Insufficient documentation

## 2023-06-06 DIAGNOSIS — F419 Anxiety disorder, unspecified: Secondary | ICD-10-CM | POA: Insufficient documentation

## 2023-06-06 DIAGNOSIS — K224 Dyskinesia of esophagus: Secondary | ICD-10-CM | POA: Diagnosis not present

## 2023-06-06 HISTORY — PX: ESOPHAGOGASTRODUODENOSCOPY (EGD) WITH PROPOFOL: SHX5813

## 2023-06-06 SURGERY — ESOPHAGOGASTRODUODENOSCOPY (EGD) WITH PROPOFOL
Anesthesia: General

## 2023-06-06 MED ORDER — PROPOFOL 1000 MG/100ML IV EMUL
INTRAVENOUS | Status: AC
Start: 2023-06-06 — End: ?
  Filled 2023-06-06: qty 100

## 2023-06-06 MED ORDER — SODIUM CHLORIDE 0.9 % IV SOLN: INTRAVENOUS | Status: DC

## 2023-06-06 MED ORDER — LIDOCAINE HCL (CARDIAC) PF 100 MG/5ML IV SOSY
PREFILLED_SYRINGE | INTRAVENOUS | Status: DC | PRN
  Administered 2023-06-06: 100 mg via INTRAVENOUS

## 2023-06-06 MED ORDER — PROPOFOL 500 MG/50ML IV EMUL
INTRAVENOUS | Status: DC | PRN
  Administered 2023-06-06: 160 ug/kg/min via INTRAVENOUS

## 2023-06-06 MED ORDER — PROPOFOL 10 MG/ML IV BOLUS
INTRAVENOUS | Status: DC | PRN
  Administered 2023-06-06: 60 mg via INTRAVENOUS

## 2023-06-06 MED ORDER — GLYCOPYRROLATE 0.2 MG/ML IJ SOLN
INTRAMUSCULAR | Status: DC | PRN
  Administered 2023-06-06: .2 mg via INTRAVENOUS

## 2023-06-06 NOTE — H&P (Signed)
Arlyss Repress, MD [address]  [address]  [place], [place] [address]  Main: [phone]  Fax: [phone] Pager: [phone]  Primary Care Physician:  Doreene Nest, NP Primary Gastroenterologist:  Dr. Arlyss Repress  Pre-Procedure History & Physical: HPI:  Katie Robbins is a 68 y.o. female is here for an endoscopy.   Past Medical History:  Diagnosis Date   Acute thoracic back pain 07/22/2018   ADHD (attention deficit hyperactivity disorder)    Anemia    Abnormal Uterine Bleeding - Resulted in Hysterectomy   Anxiety    Arthritis    Asthma    Chicken pox    Chronic headaches    Chronic kidney disease    CKD Stage 3a. No HD   Diverticulitis    Essential hypertension    GERD (gastroesophageal reflux disease)    Guillain Barr syndrome (HCC) 1982   H/O breast implant    Heart murmur    Hypertension    Hypothyroidism    Interstitial cystitis    Interstitial cystitis    Mitral prolapse 1985   Non-cardiac chest pain 06/13/2020   Nutcracker esophagus    Osteoarthritis    Soft tissue mass 03/31/2020   Stroke (HCC)    Multiple TIAs. Last one Feb. 2024    Past Surgical History:  Procedure Laterality Date   ABDOMINAL HYSTERECTOMY  2000/2009   APPENDECTOMY  2007   AUGMENTATION MAMMAPLASTY     BELPHAROPTOSIS REPAIR Bilateral    Cosmetic. Eye Lift   Bilateral Cervical Two ganglionectomies  08/2022   BOWEL RESECTION  2007   CARDIAC CATHETERIZATION  2016   Clean   CARPAL TUNNEL RELEASE Right    CHOLECYSTECTOMY  2012   COLONOSCOPY WITH PROPOFOL N/A 04/13/2020   Procedure: COLONOSCOPY WITH PROPOFOL;  Surgeon: Toney Reil, MD;  Location: [facility] ENDOSCOPY;  Service: Gastroenterology;  Laterality: N/A;   ESOPHAGOGASTRODUODENOSCOPY (EGD) WITH PROPOFOL N/A 04/13/2020   Procedure: ESOPHAGOGASTRODUODENOSCOPY (EGD) WITH PROPOFOL;  Surgeon: Toney Reil, MD;  Location: [facility] ENDOSCOPY;  Service: Gastroenterology;  Laterality: N/A;   NECK SURGERY   06/11/2016   Fusion C3-C7 Dr. Wynetta Emery   POSTERIOR CERVICAL LAMINECTOMY Bilateral 08/16/2022   Procedure: Bilateral Cervical Two ganglionectomies;  Surgeon: Jadene Pierini, MD;  Location: [facility] OR;  Service: Neurosurgery;  Laterality: Bilateral;   SMALL INTESTINE SURGERY  2007   Bowel resection   TONSILLECTOMY     VESICO-VAGINAL FISTULA REPAIR  2007    Prior to Admission medications   Medication Sig Start Date End Date Taking? Authorizing Provider  amphetamine-dextroamphetamine (ADDERALL) 20 MG tablet Take 20 mg by mouth in the morning, at noon, and at bedtime. 01/17/21  Yes [provider]  aspirin EC 81 MG tablet Take 1 tablet (81 mg total) by mouth in the morning. 08/18/22  Yes Cosentino, Talmadge Chad, PA-C  estradiol (ESTRACE) 1 MG tablet Take 1 tablet (1 mg total) by mouth daily. 05/21/23 05/20/24 Yes Linzie Collin, MD  levothyroxine (SYNTHROID) 112 MCG tablet Take 1 tablet (112 mcg total) by mouth as directed. 1 tablet on Sundays 05/21/23  Yes Shamleffer, Konrad Dolores, MD  albuterol (VENTOLIN HFA) 108 (90 Base) MCG/ACT inhaler INHALE TWO PUFFS INTO THE LUNGS EVERY SIX HOURS AS NEEDED FOR WHEEZING OR SHORTNESS OF BREATH. 12/12/22   Doreene Nest, NP  Alum & Mag Hydroxide-Simeth (GI COCKTAIL) SUSP suspension Take 20 mLs by mouth 2 (two) times daily. Maalox 30 mL, lidocaine 5 mL and dicyclomine 10 mL, dispense 450 mL,  SIG take 15 to 20 mL as needed 3-4 times daily. 04/25/23   Toney Reil, MD  amLODipine (NORVASC) 5 MG tablet TAKE ONE TABLET BY MOUTH ONCE DAILY FOR BLOOD PRESSURE 02/05/23   Doreene Nest, NP  budesonide-formoterol [facility]) 160-4.5 MCG/ACT inhaler Inhale 2 puffs into the lungs 2 (two) times daily. 02/06/22   Leslye Peer, MD  cyanocobalamin (VITAMIN B12) 1000 MCG/ML injection INJECT ONE VIAL (1 ML) INTO THE MUSCLE EVERY THREE TO FOUR WEEKS 09/10/22   Doreene Nest, NP  diazepam (VALIUM) 5 MG tablet Take 5 mg by mouth 4 (four) times daily as needed  for anxiety.    [provider]  EPINEPHrine 0.3 mg/0.3 mL IJ SOAJ injection Inject 0.3 mLs (0.3 mg total) into the muscle as needed for anaphylaxis. 02/11/19   Doreene Nest, NP  estradiol (ESTRACE) 0.1 MG/GM vaginal cream Place 0.25 Applicatorfuls vaginally at bedtime. 05/21/23 05/20/24  Linzie Collin, MD  levothyroxine (SYNTHROID) 100 MCG tablet TAKE ONE TABLET BY MOUTH MONDAY THROUGH [facility] 03/11/23   Shamleffer, Konrad Dolores, MD  MEDROL 4 MG TBPK tablet Take by mouth. Patient not taking: Reported on 06/06/2023 05/17/23   [provider]  olmesartan (BENICAR) 20 MG tablet TAKE ONE TABLET (20 MG TOTAL) BY MOUTH DAILY. 05/21/23   End, Cristal Deer, MD  pantoprazole (PROTONIX) 40 MG tablet TAKE ONE TABLET (40 MG TOTAL) BY MOUTH DAILY. FOR HEARTBURN. 01/08/23   Doreene Nest, NP  PRESCRIPTION MEDICATION Take 15 mLs by mouth daily as needed (Esophageal Spasm). 450 Gi cocktail  Bentyl    [provider]    Allergies as of 04/25/2023 - Review Complete 04/25/2023  Allergen Reaction Noted   Bactrim [sulfamethoxazole-trimethoprim] Hives 03/17/2013   Macrobid [nitrofurantoin macrocrystal] Hives 03/17/2013   Tizanidine Other (See Comments) 04/11/2016   Vicodin [hydrocodone-acetaminophen] Swelling 03/17/2013   Trileptal [oxcarbazepine] Rash 11/24/2018   Abilify [aripiprazole] Other (See Comments) 08/13/2022   Acetaminophen  12/09/2020   Amaryl [glimepiride] Other (See Comments) 08/13/2022   Ambien [zolpidem] Other (See Comments) 08/13/2022   Cyclobenzaprine Other (See Comments) 08/13/2022   Erythromycin base  10/09/2022   Gabapentin Other (See Comments) 08/13/2022   Hydrocodone Swelling 08/19/2015   Klonopin [clonazepam] Other (See Comments) 08/13/2022   Nexium [esomeprazole magnesium] Nausea And Vomiting 05/29/2017   Nortriptyline  01/02/2022   Olanzapine Other (See Comments) 08/13/2022   Pregabalin Other (See Comments) 08/13/2022   Prilosec [omeprazole]  Nausea And Vomiting 05/29/2017   Quetiapine Other (See Comments) 08/13/2022   Reglan [metoclopramide] Nausea Only 08/13/2022   Savella [milnacipran] Other (See Comments) 08/13/2022   Sulfa antibiotics Hives 03/17/2013   Tamoxifen  12/09/2020   Tapentadol Other (See Comments) 08/13/2022   Topiramate Other (See Comments) 08/13/2022   Tramadol Other (See Comments) 08/13/2022   Trimethoprim  12/09/2020   Wasp venom Hives 09/02/2019   Zoloft [sertraline] Other (See Comments) 08/13/2022   Erythromycin Rash 03/17/2013   Lamotrigine Rash 08/16/2020   Latex Rash 08/13/2022   Molds & smuts Anxiety 11/16/2020    Family History  Problem Relation Age of Onset   Hypertension Mother    GER disease Mother    Polymyalgia rheumatica Mother    COPD Mother    Osteoarthritis Mother    Osteoporosis Mother    Other Mother        Giant Cell Arteritis    GER disease Father    Heart attack Father    Spondylolysis Father     Social History   Socioeconomic  History   Marital status: Married    Spouse name: Tinnie Gens   Number of children: 2   Years of education: Some college   Highest education level: Not on file  Occupational History   Occupation: Retired  Tobacco Use   Smoking status: Never   Smokeless tobacco: Never  Vaping Use   Vaping status: Never Used  Substance and Sexual Activity   Alcohol use: Yes    Alcohol/week: 3.0 standard drinks of alcohol    Types: 2 Cans of beer, 1 Shots of liquor per week    Comment: occasional   Drug use: No   Sexual activity: Yes    Birth control/protection: Surgical  Other Topics Concern   Not on file  Social History Narrative   Lives with husband   Caffeine use: rare   Right handed   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  4 years active-2 years inactive   Social Drivers of Corporate investment banker Strain: Not on file  Food Insecurity: Not on file  Transportation Needs: Not on file  Physical Activity: Not on file  Stress: Not on file  Social Connections: Not  on file  Intimate Partner Violence: Not on file    Review of Systems: See HPI, otherwise negative ROS  Physical Exam: BP (!) 142/67   Pulse 70   Temp (!) 96.7 F (35.9 C) (Temporal)   Resp 16   Ht 5\' 3"  (1.6 m)   Wt 63.6 kg   SpO2 100%   BMI 24.84 kg/m  General:   Alert,  pleasant and cooperative in NAD Head:  Normocephalic and atraumatic. Neck:  Supple; no masses or thyromegaly. Lungs:  Clear throughout to auscultation.    Heart:  Regular rate and rhythm. Abdomen:  Soft, nontender and nondistended. Normal bowel sounds, without guarding, and without rebound.   Neurologic:  Alert and  oriented x4;  grossly normal neurologically.  Impression/Plan: Katie Robbins is here for an endoscopy to be performed for dysphagia, nut cracker esophagus  Risks, benefits, limitations, and alternatives regarding  endoscopy have been reviewed with the patient.  Questions have been answered.  All parties agreeable.   Lannette Donath, MD  06/06/2023, 8:04 AM

## 2023-06-06 NOTE — Transfer of Care (Signed)
Immediate Anesthesia Transfer of Care Note  Patient: Katie Robbins  Procedure(s) Performed: ESOPHAGOGASTRODUODENOSCOPY (EGD) WITH PROPOFOL  Patient Location: Endoscopy Unit  Anesthesia Type:General  Level of Consciousness: drowsy  Airway & Oxygen Therapy: Patient Spontanous Breathing  Post-op Assessment: Report given to RN and Post -op Vital signs reviewed and stable  Post vital signs: Reviewed and stable  Last Vitals:  Vitals Value Taken Time  BP 125/73 06/06/23 0825  Temp 36.1 C 06/06/23 0823  Pulse 90 06/06/23 0825  Resp 12 06/06/23 0825  SpO2 100 % 06/06/23 0825  Vitals shown include unfiled device data.  Last Pain:  Vitals:   06/06/23 0823  TempSrc: Temporal  PainSc: Asleep         Complications: No notable events documented.

## 2023-06-06 NOTE — Anesthesia Postprocedure Evaluation (Signed)
Anesthesia Post Note  Patient: Katie Robbins  Procedure(s) Performed: ESOPHAGOGASTRODUODENOSCOPY (EGD) WITH PROPOFOL Balloon dilation wire-guided  Patient location during evaluation: Endoscopy Anesthesia Type: General Level of consciousness: awake and alert Pain management: pain level controlled Vital Signs Assessment: post-procedure vital signs reviewed and stable Respiratory status: spontaneous breathing, nonlabored ventilation, respiratory function stable and patient connected to nasal cannula oxygen Cardiovascular status: blood pressure returned to baseline and stable Postop Assessment: no apparent nausea or vomiting Anesthetic complications: no   No notable events documented.   Last Vitals:  Vitals:   06/06/23 0726 06/06/23 0823  BP: (!) 142/67 125/73  Pulse: 70 85  Resp: 16 17  Temp: (!) 35.9 C (!) 36.1 C  SpO2: 100% 100%    Last Pain:  Vitals:   06/06/23 0833  TempSrc:   PainSc: 0-No pain                 Lenard Simmer

## 2023-06-06 NOTE — Anesthesia Preprocedure Evaluation (Signed)
Anesthesia Evaluation  Patient identified by MRN, date of birth, ID band Patient awake    Reviewed: Allergy & Precautions, H&P , NPO status , Patient's Chart, lab work & pertinent test results, reviewed documented beta blocker date and time   History of Anesthesia Complications Negative for: history of anesthetic complications  Airway Mallampati: III  TM Distance: >3 FB Neck ROM: full    Dental  (+) Dental Advidsory Given, Chipped   Pulmonary neg shortness of breath, asthma , neg sleep apnea, neg COPD, neg recent URI   Pulmonary exam normal breath sounds clear to auscultation       Cardiovascular Exercise Tolerance: Good hypertension, (-) angina (-) Past MI and (-) Cardiac Stents Normal cardiovascular exam(-) dysrhythmias + Valvular Problems/Murmurs  Rhythm:regular Rate:Normal     Neuro/Psych  Headaches, neg Seizures PSYCHIATRIC DISORDERS Anxiety     TIA Neuromuscular disease CVA, No Residual Symptoms    GI/Hepatic Neg liver ROS,GERD  ,,  Endo/Other  neg diabetesHypothyroidism    Renal/GU CRFRenal disease  negative genitourinary   Musculoskeletal   Abdominal   Peds  Hematology  (+) Blood dyscrasia, anemia   Anesthesia Other Findings Past Medical History: 07/22/2018: Acute thoracic back pain No date: ADHD (attention deficit hyperactivity disorder) No date: Anemia     Comment:  Abnormal Uterine Bleeding - Resulted in Hysterectomy No date: Anxiety No date: Arthritis No date: Asthma No date: Chicken pox No date: Chronic headaches No date: Chronic kidney disease     Comment:  CKD Stage 3a. No HD No date: Diverticulitis No date: Essential hypertension No date: GERD (gastroesophageal reflux disease) 1982: Guillain Barr syndrome (HCC) No date: H/O breast implant No date: Heart murmur No date: Hypertension No date: Hypothyroidism No date: Interstitial cystitis No date: Interstitial cystitis 1985: Mitral  prolapse 06/13/2020: Non-cardiac chest pain No date: Nutcracker esophagus No date: Osteoarthritis 03/31/2020: Soft tissue mass No date: Stroke [facility])     Comment:  Multiple TIAs. Last one Feb. 2024   Reproductive/Obstetrics negative OB ROS                             Anesthesia Physical Anesthesia Plan  ASA: 3  Anesthesia Plan: General   Post-op Pain Management:    Induction: Intravenous  PONV Risk Score and Plan: 3 and Propofol infusion, TIVA and Treatment may vary due to age or medical condition  Airway Management Planned: Natural Airway and Nasal Cannula  Additional Equipment:   Intra-op Plan:   Post-operative Plan:   Informed Consent: I have reviewed the patients History and Physical, chart, labs and discussed the procedure including the risks, benefits and alternatives for the proposed anesthesia with the patient or authorized representative who has indicated his/her understanding and acceptance.     Dental Advisory Given  Plan Discussed with: Anesthesiologist, CRNA and Surgeon  Anesthesia Plan Comments:        Anesthesia Quick Evaluation

## 2023-06-06 NOTE — Op Note (Signed)
[facility] Patient Name: Katie Robbins Procedure Date: 06/06/2023 7:04 AM MRN: [id number] Account #: [id number] Date of Birth: Sep 24, 1955 Admit Type: Outpatient Age: 68 Room: [facility] ENDO ROOM 2 Gender: Female Note Status: Finalized Instrument Name: Laurette Schimke [id number] Procedure:             Upper GI endoscopy Indications:           Esophageal dysphagia, nut cracker esophagus Providers:             Toney Reil MD, MD Referring MD:          Doreene Nest (Referring MD) Medicines:             General Anesthesia Complications:         No immediate complications. Estimated blood loss:                         Minimal. Procedure:             Pre-Anesthesia Assessment:                        - Prior to the procedure, a History and Physical was                         performed, and patient medications and allergies were                         reviewed. The patient is competent. The risks and                         benefits of the procedure and the sedation options and                         risks were discussed with the patient. All questions                         were answered and informed consent was obtained.                         Patient identification and proposed procedure were                         verified by the physician, the nurse, the                         anesthesiologist, the anesthetist and the technician                         in the pre-procedure area in the procedure room in the                         endoscopy suite. Mental Status Examination: alert and                         oriented. Airway Examination: normal oropharyngeal                         airway and neck mobility. Respiratory Examination:  clear to auscultation. CV Examination: normal.                         Prophylactic Antibiotics: The patient does not require                         prophylactic antibiotics. Prior  Anticoagulants: The                         patient has taken no anticoagulant or antiplatelet                         agents. ASA Grade Assessment: III - A patient with                         severe systemic disease. After reviewing the risks and                         benefits, the patient was deemed in satisfactory                         condition to undergo the procedure. The anesthesia                         plan was to use general anesthesia. Immediately prior                         to administration of medications, the patient was                         re-assessed for adequacy to receive sedatives. The                         heart rate, respiratory rate, oxygen saturations,                         blood pressure, adequacy of pulmonary ventilation, and                         response to care were monitored throughout the                         procedure. The physical status of the patient was                         re-assessed after the procedure.                        After obtaining informed consent, the endoscope was                         passed under direct vision. Throughout the procedure,                         the patient's blood pressure, pulse, and oxygen                         saturations were monitored continuously. The Endoscope  was introduced through the mouth, and advanced to the                         second part of duodenum. The upper GI endoscopy was                         accomplished without difficulty. The patient tolerated                         the procedure well. Findings:      The duodenal bulb and second portion of the duodenum were normal.      The entire examined stomach was normal.      The cardia and gastric fundus were normal on retroflexion.      The gastroesophageal junction and examined esophagus were normal. A TTS       dilator was passed through the scope. empiric Dilation with a 12-13.5-15       mm  balloon and a 15-16.5-18 mm balloon dilator was performed to 18 mm.       The dilation site was examined following endoscope reinsertion and       showed mild mucosal disruption and moderate improvement in luminal       narrowing. Estimated blood loss was minimal. Impression:            - Normal duodenal bulb and second portion of the                         duodenum.                        - Normal stomach.                        - Normal gastroesophageal junction and esophagus.                        - No specimens collected. Recommendation:        - Discharge patient to home (with escort).                        - Resume previous diet today.                        - Continue present medications.                        - Return to my office PRN. Procedure Code(s):     --- Professional ---                        [phone], Esophagogastroduodenoscopy, flexible,                         transoral; with transendoscopic balloon dilation of                         esophagus (less than 30 mm diameter) Diagnosis Code(s):     --- Professional ---                        R13.14, Dysphagia, pharyngoesophageal phase CPT copyright 2022 American Medical Association. All  rights reserved. The codes documented in this report are preliminary and upon coder review may  be revised to meet current compliance requirements. Dr. Libby Maw Toney Reil MD, MD 06/06/2023 8:22:34 AM This report has been signed electronically. Number of Addenda: 0 Note Initiated On: 06/06/2023 7:04 AM Estimated Blood Loss:  Estimated blood loss was minimal.      [facility]

## 2023-06-12 DIAGNOSIS — Z0100 Encounter for examination of eyes and vision without abnormal findings: Secondary | ICD-10-CM

## 2023-06-12 DIAGNOSIS — E2839 Other primary ovarian failure: Secondary | ICD-10-CM

## 2023-06-28 ENCOUNTER — Ambulatory Visit (HOSPITAL_COMMUNITY): Payer: Medicare Other | Admitting: Psychiatry

## 2023-06-28 DIAGNOSIS — F419 Anxiety disorder, unspecified: Secondary | ICD-10-CM | POA: Diagnosis not present

## 2023-06-28 DIAGNOSIS — F0630 Mood disorder due to known physiological condition, unspecified: Secondary | ICD-10-CM | POA: Diagnosis not present

## 2023-06-28 DIAGNOSIS — S069XAS Unspecified intracranial injury with loss of consciousness status unknown, sequela: Secondary | ICD-10-CM | POA: Diagnosis not present

## 2023-06-28 NOTE — Progress Notes (Signed)
Virtual Visit via Video Note  I connected with [name] on 06/28/23 at 11:04 AM EST by a video enabled telemedicine application and verified that I am speaking with the correct person using two identifiers.  Location: Patient: Home Provider: [facility] Outpatient [place] office    I discussed the limitations of evaluation and management by telemedicine and the availability of in person appointments. The patient expressed understanding and agreed to proceed.   I provided 50 minutes of non-face-to-face time during this encounter.   Adah Salvage, LCSW      THERAPIST PROGRESS NOTE  Session Time: Friday 06/28/2023  11:04 AM - 11:54 AM   Participation Level: Active  Behavioral Response: Alert/Anxious/Talkative/tangentiality  Type of Therapy: Individual Therapy  Treatment Goals addressed: Reduce worry, panic when interacting with others AEB pt reducing intensity of panic and nervousness from an 8 to 4 per ( 10 pt scale)pt's report Learn and implement relaxation techniques, practice daily      Progress on goals: Progressing    Interventions: CBT and Supportive  Summary: Katie Robbins is a 68 y.o. female who initially was referred for services for continuity of care while her previous therapist was on medical leave.  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 She is a returning patient to this clinician and last was seen in July 2023.  Per patient's report, her husband urged her to call as she cannot have a conversation with other people without interrupting.  She reports being irritable and staying manage with her husband for a couple of days if he says the wrong thing.  Patient states being very moody and becoming upset easily over little things.  She also reports crying almost daily.  She reports decreased interest and involvement in activities.  She states not leaving the house or socializing.  Patient states feeling stressed about  possibly saying wrong thing to husband as she fears he will leave her although she states knowing he will not leave.    Patient last was seen via virtual visit about 3-4 weeks ago.  She reports continued neck and head pain since last session. Per her report, she  met with doctor and received results of CT scan. Her doctor did a nerve block this past Monday. Pt is scheduled to see him again next month. She is hopeful she eventually will have an ablation to address the neck pain. She still is having difficulty sleeping due to the pain and reports limited involvement in activity due to balance issues. She continues to pace self and tries to do light household tasks.Her husband now works from home and does most of the household tasks. Pt reports he is very supportive. Per her report, they have conflict at times but are able to talk it out. Pt reports continued improved communication in the relationship and denies any nervousness/anxiety regarding expressing her concerns. She also reports feeling comfortable interacting with other family members and friends.  Pt remains pleased with her progress in therapy and expresses confidence in using helpful coping strategies.   Therapist Response: reviewed symptoms,disucssed stressors, facilitated expression of thoughts and feelings,  praised and reinforced pt pacing self/ use of coping statements and her spirituality/faith. discussed pt's progress in therapy, did termination, encouraged pt to contact this practice should she need psychotherapy in the future.   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 Sandria Manly for medication management  Patient/Guardian was advised Release of Information must be obtained prior to any record release in order to collaborate their care with an outside provider. Patient/Guardian was  advised if they have not already done so to contact the registration department to sign all necessary forms in order for [place] to release information regarding their care.   Consent: Patient/Guardian gives verbal consent for treatment and assignment of benefits for services provided during this visit. Patient/Guardian expressed understanding and agreed to proceed.      Adah Salvage, LCSW 06/28/2023   Adah Salvage LCSW   Outpatient Therapist Discharge Summary  Katie Robbins    06-20-1955   Admission Date: 05/03/2022 Discharge Date:  06/28/2023 Reason for Discharge:  Goals accomplished  Diagnosis:  Axis I:  Mood disorder as late effect of traumatic brain injury Katie Robbins)   Comments: Pt is encouraged to call this practice should she need psychotherapy services in the future. She will continue to see psychiatrist Dr. Sandria Manly for medication management.   Katie Robbins Katie Aithan Farrelly LCSW

## 2023-07-01 ENCOUNTER — Encounter: Payer: Self-pay | Admitting: Internal Medicine

## 2023-07-17 ENCOUNTER — Encounter: Payer: Self-pay | Admitting: Emergency Medicine

## 2023-07-19 NOTE — Telephone Encounter (Signed)
 With the SOB, and the changes on CT scan, lets get her into be seen next available - It should be before May

## 2023-07-22 ENCOUNTER — Encounter: Payer: Self-pay | Admitting: *Deleted

## 2023-07-23 ENCOUNTER — Telehealth: Payer: Self-pay

## 2023-07-23 ENCOUNTER — Other Ambulatory Visit: Payer: Self-pay | Admitting: Primary Care

## 2023-07-23 DIAGNOSIS — J4540 Moderate persistent asthma, uncomplicated: Secondary | ICD-10-CM

## 2023-07-23 MED ORDER — ALBUTEROL SULFATE HFA 108 (90 BASE) MCG/ACT IN AERS: 2.0000 | INHALATION_SPRAY | Freq: Four times a day (QID) | RESPIRATORY_TRACT | 0 refills | Status: DC | PRN

## 2023-07-23 NOTE — Telephone Encounter (Signed)
 Copied from CRM [id number]. Topic: Clinical - Prescription Issue >> Jul 23, 2023 12:15 PM [place] E wrote: Reason for CRM: Patient was told by her pharmacy that she is out of refills for her albuterol (VENTOLIN HFA) 108 (90 Base) MCG/ACT inhaler. Patient requesting PCP to sign off on more refills.

## 2023-07-23 NOTE — Addendum Note (Signed)
 Addended by: Eden Emms on: 07/23/2023 04:07 PM   Modules accepted: Orders

## 2023-08-15 ENCOUNTER — Other Ambulatory Visit: Payer: Self-pay | Admitting: Nurse Practitioner

## 2023-08-15 DIAGNOSIS — J4540 Moderate persistent asthma, uncomplicated: Secondary | ICD-10-CM

## 2023-08-28 ENCOUNTER — Other Ambulatory Visit: Payer: Self-pay | Admitting: Internal Medicine

## 2023-08-28 DIAGNOSIS — E039 Hypothyroidism, unspecified: Secondary | ICD-10-CM

## 2023-09-09 ENCOUNTER — Other Ambulatory Visit: Payer: Self-pay | Admitting: Neurosurgery

## 2023-09-11 ENCOUNTER — Encounter: Payer: Self-pay | Admitting: Internal Medicine

## 2023-09-11 ENCOUNTER — Encounter: Payer: Self-pay | Admitting: Emergency Medicine

## 2023-09-11 DIAGNOSIS — E039 Hypothyroidism, unspecified: Secondary | ICD-10-CM

## 2023-09-12 NOTE — Progress Notes (Addendum)
 Surgical Instructions   Your procedure is scheduled on May 5. Report to [facility] Main Entrance "A" at 8:30 A.M., then check in with the Admitting office. Any questions or running late day of surgery: call [phone]  Questions prior to your surgery date: call [phone], Monday-Friday, 8am-4pm. If you experience any cold or flu symptoms such as cough, fever, chills, shortness of breath, etc. between now and your scheduled surgery, please notify us  at the above number.     Remember:  Do not eat or drink anything after midnight the night before your surgery   Take these medicines the morning of surgery with A SIP OF WATER  amLODipine  (NORVASC )  estradiol  (ESTRACE )  levothyroxine  (SYNTHROID )   May take these medicines IF NEEDED: albuterol  (VENTOLIN  HFA) 108 (90 Base) MCG/ACT inhaler -bring to the hospital  budesonide -formoterol  (SYMBICORT ) 160-4.5 MCG/ACT inhaler  carboxymethylcellulose (REFRESH PLUS  diazepam  (VALIUM )   FOLLOW YOUR SURGEON'S INSTRUCTIONS FOR HOLDING ASPIRIN . IF NO INSTRUCTIONS WERE GIVEN,YOU WILL NEED TO CALL THE OFFICE.   One week prior to surgery, STOP taking any Aspirin  (unless otherwise instructed by your surgeon) Aleve, Naproxen, Ibuprofen, Motrin, Advil, Goody's, BC's, all herbal medications, fish oil, and non-prescription vitamins.                     Do NOT Smoke (Tobacco/Vaping) for 24 hours prior to your procedure.  If you use a CPAP at night, you may bring your mask/headgear for your overnight stay.   You will be asked to remove any contacts, glasses, piercing's, hearing aid's, dentures/partials prior to surgery. Please bring cases for these items if needed.    Patients discharged the day of surgery will not be allowed to drive home, and someone needs to stay with them for 24 hours.  SURGICAL WAITING ROOM VISITATION Patients may have no more than 2 support people in the waiting area - these visitors may rotate.   Pre-op nurse will coordinate an  appropriate time for 1 ADULT support person, who may not rotate, to accompany patient in pre-op.  Children under the age of 64 must have an adult with them who is not the patient and must remain in the main waiting area with an adult.  If the patient needs to stay at the hospital during part of their recovery, the visitor guidelines for inpatient rooms apply.  Please refer to the [facility] website for the visitor guidelines for any additional information.   If you received a COVID test during your pre-op visit  it is requested that you wear a mask when out in public, stay away from anyone that may not be feeling well and notify your surgeon if you develop symptoms. If you have been in contact with anyone that has tested positive in the last 10 days please notify you surgeon.      Pre-operative 5 CHG Bathing Instructions   You can play a key role in reducing the risk of infection after surgery. Your skin needs to be as free of germs as possible. You can reduce the number of germs on your skin by washing with CHG (chlorhexidine  gluconate) soap before surgery. CHG is an antiseptic soap that kills germs and continues to kill germs even after washing.   DO NOT use if you have an allergy to chlorhexidine /CHG or antibacterial soaps. If your skin becomes reddened or irritated, stop using the CHG and notify one of our RNs at [phone].   Please shower with the CHG soap starting 4  days before surgery using the following schedule:     Please keep in mind the following:  DO NOT shave, including legs and underarms, starting the day of your first shower.   You may shave your face at any point before/day of surgery.  Place clean sheets on your bed the day you start using CHG soap. Use a clean washcloth (not used since being washed) for each shower. DO NOT sleep with pets once you start using the CHG.   CHG Shower Instructions:  Wash your face and private area with normal soap. If you choose to  wash your hair, wash first with your normal shampoo.  After you use shampoo/soap, rinse your hair and body thoroughly to remove shampoo/soap residue.  Turn the water OFF and apply about 3 tablespoons (45 ml) of CHG soap to a CLEAN washcloth.  Apply CHG soap ONLY FROM YOUR NECK DOWN TO YOUR TOES (washing for 3-5 minutes)  DO NOT use CHG soap on face, private areas, open wounds, or sores.  Pay special attention to the area where your surgery is being performed.  If you are having back surgery, having someone wash your back for you may be helpful. Wait 2 minutes after CHG soap is applied, then you may rinse off the CHG soap.  Pat dry with a clean towel  Put on clean clothes/pajamas   If you choose to wear lotion, please use ONLY the CHG-compatible lotions that are listed below.  Additional instructions for the day of surgery: DO NOT APPLY any lotions, deodorants, cologne, or perfumes.   Do not bring valuables to the hospital. [facility] is not responsible for any belongings/valuables. Do not wear nail polish, gel polish, artificial nails, or any other type of covering on natural nails (fingers and toes) Do not wear jewelry or makeup Put on clean/comfortable clothes.  Please brush your teeth.  Ask your nurse before applying any prescription medications to the skin.     CHG Compatible Lotions   Aveeno Moisturizing lotion  Cetaphil Moisturizing Cream  Cetaphil Moisturizing Lotion  Clairol Herbal Essence Moisturizing Lotion, Dry Skin  Clairol Herbal Essence Moisturizing Lotion, Extra Dry Skin  Clairol Herbal Essence Moisturizing Lotion, Normal Skin  Curel Age Defying Therapeutic Moisturizing Lotion with Alpha Hydroxy  Curel Extreme Care Body Lotion  Curel Soothing Hands Moisturizing Hand Lotion  Curel Therapeutic Moisturizing Cream, Fragrance-Free  Curel Therapeutic Moisturizing Lotion, Fragrance-Free  Curel Therapeutic Moisturizing Lotion, Original Formula  Eucerin Daily Replenishing  Lotion  Eucerin Dry Skin Therapy Plus Alpha Hydroxy Crme  Eucerin Dry Skin Therapy Plus Alpha Hydroxy Lotion  Eucerin Original Crme  Eucerin Original Lotion  Eucerin Plus Crme Eucerin Plus Lotion  Eucerin TriLipid Replenishing Lotion  Keri Anti-Bacterial Hand Lotion  Keri Deep Conditioning Original Lotion Dry Skin Formula Softly Scented  Keri Deep Conditioning Original Lotion, Fragrance Free Sensitive Skin Formula  Keri Lotion Fast Absorbing Fragrance Free Sensitive Skin Formula  Keri Lotion Fast Absorbing Softly Scented Dry Skin Formula  Keri Original Lotion  Keri Skin Renewal Lotion Keri Silky Smooth Lotion  Keri Silky Smooth Sensitive Skin Lotion  Nivea Body Creamy Conditioning Oil  Nivea Body Extra Enriched Lotion  Nivea Body Original Lotion  Nivea Body Sheer Moisturizing Lotion Nivea Crme  Nivea Skin Firming Lotion  NutraDerm 30 Skin Lotion  NutraDerm Skin Lotion  NutraDerm Therapeutic Skin Cream  NutraDerm Therapeutic Skin Lotion  ProShield Protective Hand Cream  Provon moisturizing lotion  Please read over the following fact sheets that you were  given.

## 2023-09-13 ENCOUNTER — Inpatient Hospital Stay (HOSPITAL_COMMUNITY)
Admission: RE | Admit: 2023-09-13 | Discharge: 2023-09-13 | Disposition: A | Discharge status: Home / Self Care | Source: Ambulatory Visit

## 2023-09-13 DIAGNOSIS — Z01818 Encounter for other preprocedural examination: Secondary | ICD-10-CM

## 2023-09-13 NOTE — Progress Notes (Signed)
 Surgical Instructions   Your procedure is scheduled on Friday, May 9th, 2025. Report to [facility] Main Entrance "A" at 9:15 A.M., then check in with the Admitting office. Any questions or running late day of surgery: call [phone]  Questions prior to your surgery date: call [phone], Monday-Friday, 8am-4pm. If you experience any cold or flu symptoms such as cough, fever, chills, shortness of breath, etc. between now and your scheduled surgery, please notify us  at the above number.     Remember:  Do not eat or drink after midnight the night before your surgery      Take these medicines the morning of surgery with A SIP OF WATER: Amlodipine  (Norvasc ) Estradiol  (Estrace ) Levothyroxine  (Synthroid )   May take these medicines IF NEEDED: Albuterol  (Ventolin ) Inhaler - please bring with you to the hospital Budesonide -formoterol  (Symbicort ) Inhaler Carboxymethylcellulose (Refresh) eye drop Diazepam  (Valium )   Follow your surgeon's instructions on when to stop your Aspirin .  If no instructions received, please call the surgeon's office.    One week prior to surgery, STOP taking any Aspirin  (unless otherwise instructed by your surgeon) Aleve, Naproxen, Ibuprofen, Motrin, Advil, Goody's, BC's, all herbal medications, fish oil, and non-prescription vitamins.                     Do NOT Smoke (Tobacco/Vaping) for 24 hours prior to your procedure.  If you use a CPAP at night, you may bring your mask/headgear for your overnight stay.   You will be asked to remove any contacts, glasses, piercing's, hearing aid's, dentures/partials prior to surgery. Please bring cases for these items if needed.    Patients discharged the day of surgery will not be allowed to drive home, and someone needs to stay with them for 24 hours.  SURGICAL WAITING ROOM VISITATION Patients may have no more than 2 support people in the waiting area - these visitors may rotate.   Pre-op nurse will coordinate an  appropriate time for 1 ADULT support person, who may not rotate, to accompany patient in pre-op.  Children under the age of 69 must have an adult with them who is not the patient and must remain in the main waiting area with an adult.  If the patient needs to stay at the hospital during part of their recovery, the visitor guidelines for inpatient rooms apply.  Please refer to the [facility] website for the visitor guidelines for any additional information.   If you received a COVID test during your pre-op visit  it is requested that you wear a mask when out in public, stay away from anyone that may not be feeling well and notify your surgeon if you develop symptoms. If you have been in contact with anyone that has tested positive in the last 10 days please notify you surgeon.      Pre-operative 5 CHG Bathing Instructions   You can play a key role in reducing the risk of infection after surgery. Your skin needs to be as free of germs as possible. You can reduce the number of germs on your skin by washing with CHG (chlorhexidine  gluconate) soap before surgery. CHG is an antiseptic soap that kills germs and continues to kill germs even after washing.   DO NOT use if you have an allergy to chlorhexidine /CHG or antibacterial soaps. If your skin becomes reddened or irritated, stop using the CHG and notify one of our RNs at [phone].   Please shower with the CHG soap starting 4 days  before surgery using the following schedule:     Please keep in mind the following:  DO NOT shave, including legs and underarms, starting the day of your first shower.   You may shave your face at any point before/day of surgery.  Place clean sheets on your bed the day you start using CHG soap. Use a clean washcloth (not used since being washed) for each shower. DO NOT sleep with pets once you start using the CHG.   CHG Shower Instructions:  Wash your face and private area with normal soap. If you choose to  wash your hair, wash first with your normal shampoo.  After you use shampoo/soap, rinse your hair and body thoroughly to remove shampoo/soap residue.  Turn the water OFF and apply about 3 tablespoons (45 ml) of CHG soap to a CLEAN washcloth.  Apply CHG soap ONLY FROM YOUR NECK DOWN TO YOUR TOES (washing for 3-5 minutes)  DO NOT use CHG soap on face, private areas, open wounds, or sores.  Pay special attention to the area where your surgery is being performed.  If you are having back surgery, having someone wash your back for you may be helpful. Wait 2 minutes after CHG soap is applied, then you may rinse off the CHG soap.  Pat dry with a clean towel  Put on clean clothes/pajamas   If you choose to wear lotion, please use ONLY the CHG-compatible lotions that are listed below.  Additional instructions for the day of surgery: DO NOT APPLY any lotions, deodorants, cologne, or perfumes.   Do not bring valuables to the hospital. [facility] is not responsible for any belongings/valuables. Do not wear nail polish, gel polish, artificial nails, or any other type of covering on natural nails (fingers and toes) Do not wear jewelry or makeup Put on clean/comfortable clothes.  Please brush your teeth.  Ask your nurse before applying any prescription medications to the skin.     CHG Compatible Lotions   Aveeno Moisturizing lotion  Cetaphil Moisturizing Cream  Cetaphil Moisturizing Lotion  Clairol Herbal Essence Moisturizing Lotion, Dry Skin  Clairol Herbal Essence Moisturizing Lotion, Extra Dry Skin  Clairol Herbal Essence Moisturizing Lotion, Normal Skin  Curel Age Defying Therapeutic Moisturizing Lotion with Alpha Hydroxy  Curel Extreme Care Body Lotion  Curel Soothing Hands Moisturizing Hand Lotion  Curel Therapeutic Moisturizing Cream, Fragrance-Free  Curel Therapeutic Moisturizing Lotion, Fragrance-Free  Curel Therapeutic Moisturizing Lotion, Original Formula  Eucerin Daily Replenishing  Lotion  Eucerin Dry Skin Therapy Plus Alpha Hydroxy Crme  Eucerin Dry Skin Therapy Plus Alpha Hydroxy Lotion  Eucerin Original Crme  Eucerin Original Lotion  Eucerin Plus Crme Eucerin Plus Lotion  Eucerin TriLipid Replenishing Lotion  Keri Anti-Bacterial Hand Lotion  Keri Deep Conditioning Original Lotion Dry Skin Formula Softly Scented  Keri Deep Conditioning Original Lotion, Fragrance Free Sensitive Skin Formula  Keri Lotion Fast Absorbing Fragrance Free Sensitive Skin Formula  Keri Lotion Fast Absorbing Softly Scented Dry Skin Formula  Keri Original Lotion  Keri Skin Renewal Lotion Keri Silky Smooth Lotion  Keri Silky Smooth Sensitive Skin Lotion  Nivea Body Creamy Conditioning Oil  Nivea Body Extra Enriched Lotion  Nivea Body Original Lotion  Nivea Body Sheer Moisturizing Lotion Nivea Crme  Nivea Skin Firming Lotion  NutraDerm 30 Skin Lotion  NutraDerm Skin Lotion  NutraDerm Therapeutic Skin Cream  NutraDerm Therapeutic Skin Lotion  ProShield Protective Hand Cream  Provon moisturizing lotion  Please read over the following fact sheets that you were given.

## 2023-09-13 NOTE — Progress Notes (Signed)
 Surgical Instructions     Your procedure is scheduled on Friday, May 9th, 2025. Report to [facility] Main Entrance "A" at 9:15 A.M., then check in with the Admitting office. Any questions or running late day of surgery: call [phone]   Questions prior to your surgery date: call [phone], Monday-Friday, 8am-4pm. If you experience any cold or flu symptoms such as cough, fever, chills, shortness of breath, etc. between now and your scheduled surgery, please notify us  at the above number.            Remember:       Do not eat or drink anything after midnight the night before your surgery        Take these medicines the morning of surgery with A SIP OF WATER  amLODipine  (NORVASC )  estradiol  (ESTRACE )  levothyroxine  (SYNTHROID )     May take these medicines IF NEEDED: albuterol  (VENTOLIN  HFA) 108 (90 Base) MCG/ACT inhaler -bring to the hospital  budesonide -formoterol  (SYMBICORT ) 160-4.5 MCG/ACT inhaler  carboxymethylcellulose (REFRESH PLUS  diazepam  (VALIUM )     FOLLOW YOUR SURGEON'S INSTRUCTIONS FOR HOLDING ASPIRIN . IF NO INSTRUCTIONS WERE GIVEN,YOU WILL NEED TO CALL THE OFFICE.    One week prior to surgery, STOP taking any Aspirin  (unless otherwise instructed by your surgeon) Aleve, Naproxen, Ibuprofen, Motrin, Advil, Goody's, BC's, all herbal medications, fish oil, and non-prescription vitamins.                     Do NOT Smoke (Tobacco/Vaping) for 24 hours prior to your procedure.   If you use a CPAP at night, you may bring your mask/headgear for your overnight stay.   You will be asked to remove any contacts, glasses, piercing's, hearing aid's, dentures/partials prior to surgery. Please bring cases for these items if needed.    Patients discharged the day of surgery will not be allowed to drive home, and someone needs to stay with them for 24 hours.   SURGICAL WAITING ROOM VISITATION Patients may have no more than 2 support people in the waiting area - these visitors  may rotate.   Pre-op nurse will coordinate an appropriate time for 1 ADULT support person, who may not rotate, to accompany patient in pre-op.  Children under the age of 35 must have an adult with them who is not the patient and must remain in the main waiting area with an adult.   If the patient needs to stay at the hospital during part of their recovery, the visitor guidelines for inpatient rooms apply.   Please refer to the [facility] website for the visitor guidelines for any additional information.     If you received a COVID test during your pre-op visit  it is requested that you wear a mask when out in public, stay away from anyone that may not be feeling well and notify your surgeon if you develop symptoms. If you have been in contact with anyone that has tested positive in the last 10 days please notify you surgeon.         Pre-operative 5 CHG Bathing Instructions    You can play a key role in reducing the risk of infection after surgery. Your skin needs to be as free of germs as possible. You can reduce the number of germs on your skin by washing with CHG (chlorhexidine  gluconate) soap before surgery. CHG is an antiseptic soap that kills germs and continues to kill germs even after washing.    DO NOT use if  you have an allergy to chlorhexidine /CHG or antibacterial soaps. If your skin becomes reddened or irritated, stop using the CHG and notify one of our RNs at [phone].    Please shower with the CHG soap starting 4 days before surgery using the following schedule:       Please keep in mind the following:  DO NOT shave, including legs and underarms, starting the day of your first shower.   You may shave your face at any point before/day of surgery.  Place clean sheets on your bed the day you start using CHG soap. Use a clean washcloth (not used since being washed) for each shower. DO NOT sleep with pets once you start using the CHG.    CHG Shower Instructions:  Wash  your face and private area with normal soap. If you choose to wash your hair, wash first with your normal shampoo.  After you use shampoo/soap, rinse your hair and body thoroughly to remove shampoo/soap residue.  Turn the water OFF and apply about 3 tablespoons (45 ml) of CHG soap to a CLEAN washcloth.  Apply CHG soap ONLY FROM YOUR NECK DOWN TO YOUR TOES (washing for 3-5 minutes)  DO NOT use CHG soap on face, private areas, open wounds, or sores.  Pay special attention to the area where your surgery is being performed.  If you are having back surgery, having someone wash your back for you may be helpful. Wait 2 minutes after CHG soap is applied, then you may rinse off the CHG soap.  Pat dry with a clean towel  Put on clean clothes/pajamas   If you choose to wear lotion, please use ONLY the CHG-compatible lotions that are listed below.   Additional instructions for the day of surgery: DO NOT APPLY any lotions, deodorants, cologne, or perfumes.   Do not bring valuables to the hospital. [facility] is not responsible for any belongings/valuables. Do not wear nail polish, gel polish, artificial nails, or any other type of covering on natural nails (fingers and toes) Do not wear jewelry or makeup Put on clean/comfortable clothes.  Please brush your teeth.  Ask your nurse before applying any prescription medications to the skin.        CHG Compatible Lotions    Aveeno Moisturizing lotion  Cetaphil Moisturizing Cream  Cetaphil Moisturizing Lotion  Clairol Herbal Essence Moisturizing Lotion, Dry Skin  Clairol Herbal Essence Moisturizing Lotion, Extra Dry Skin  Clairol Herbal Essence Moisturizing Lotion, Normal Skin  Curel Age Defying Therapeutic Moisturizing Lotion with Alpha Hydroxy  Curel Extreme Care Body Lotion  Curel Soothing Hands Moisturizing Hand Lotion  Curel Therapeutic Moisturizing Cream, Fragrance-Free  Curel Therapeutic Moisturizing Lotion, Fragrance-Free  Curel Therapeutic  Moisturizing Lotion, Original Formula  Eucerin Daily Replenishing Lotion  Eucerin Dry Skin Therapy Plus Alpha Hydroxy Crme  Eucerin Dry Skin Therapy Plus Alpha Hydroxy Lotion  Eucerin Original Crme  Eucerin Original Lotion  Eucerin Plus Crme Eucerin Plus Lotion  Eucerin TriLipid Replenishing Lotion  Keri Anti-Bacterial Hand Lotion  Keri Deep Conditioning Original Lotion Dry Skin Formula Softly Scented  Keri Deep Conditioning Original Lotion, Fragrance Free Sensitive Skin Formula  Keri Lotion Fast Absorbing Fragrance Free Sensitive Skin Formula  Keri Lotion Fast Absorbing Softly Scented Dry Skin Formula  Keri Original Lotion  Keri Skin Renewal Lotion Keri Silky Smooth Lotion  Keri Silky Smooth Sensitive Skin Lotion  Nivea Body Creamy Conditioning Oil  Nivea Body Extra Enriched Psychologist, educational  Lotion Nivea Crme  Nivea Skin Firming Lotion  NutraDerm 30 Skin Lotion  NutraDerm Skin Lotion  NutraDerm Therapeutic Skin Cream  NutraDerm Therapeutic Skin Lotion  ProShield Protective Hand Cream  Provon moisturizing lotion   Please read over the following fact sheets that you were given

## 2023-09-16 ENCOUNTER — Other Ambulatory Visit: Payer: Self-pay | Admitting: Primary Care

## 2023-09-16 DIAGNOSIS — E538 Deficiency of other specified B group vitamins: Secondary | ICD-10-CM

## 2023-09-17 ENCOUNTER — Other Ambulatory Visit

## 2023-09-17 ENCOUNTER — Other Ambulatory Visit: Payer: Self-pay

## 2023-09-17 ENCOUNTER — Encounter (HOSPITAL_COMMUNITY): Payer: Self-pay

## 2023-09-17 ENCOUNTER — Encounter (HOSPITAL_COMMUNITY)
Admission: RE | Admit: 2023-09-17 | Discharge: 2023-09-17 | Disposition: A | Discharge status: Home / Self Care | Source: Ambulatory Visit | Attending: Neurosurgery | Admitting: Neurosurgery

## 2023-09-17 VITALS — BP 153/73 | HR 90 | Temp 98.2°F | Resp 17 | Ht 63.0 in | Wt 136.0 lb

## 2023-09-17 DIAGNOSIS — E039 Hypothyroidism, unspecified: Secondary | ICD-10-CM | POA: Diagnosis not present

## 2023-09-17 DIAGNOSIS — Z01812 Encounter for preprocedural laboratory examination: Secondary | ICD-10-CM | POA: Insufficient documentation

## 2023-09-17 DIAGNOSIS — Z86718 Personal history of other venous thrombosis and embolism: Secondary | ICD-10-CM | POA: Diagnosis not present

## 2023-09-17 DIAGNOSIS — Z01818 Encounter for other preprocedural examination: Secondary | ICD-10-CM

## 2023-09-17 DIAGNOSIS — I10 Essential (primary) hypertension: Secondary | ICD-10-CM | POA: Diagnosis not present

## 2023-09-17 HISTORY — DX: Unspecified mental disorder due to known physiological condition: F09

## 2023-09-17 HISTORY — DX: Nausea with vomiting, unspecified: Z98.890

## 2023-09-17 LAB — CBC
HCT: 38.2 % (ref 36.0–46.0)
Hemoglobin: 12.4 g/dL (ref 12.0–15.0)
MCH: 30 pg (ref 26.0–34.0)
MCHC: 32.5 g/dL (ref 30.0–36.0)
MCV: 92.3 fL (ref 80.0–100.0)
Platelets: 242 10*3/uL (ref 150–400)
RBC: 4.14 MIL/uL (ref 3.87–5.11)
RDW: 13.4 % (ref 11.5–15.5)
WBC: 9 10*3/uL (ref 4.0–10.5)
nRBC: 0 % (ref 0.0–0.2)

## 2023-09-17 LAB — SURGICAL PCR SCREEN
MRSA, PCR: NEGATIVE
Staphylococcus aureus: NEGATIVE

## 2023-09-17 LAB — BASIC METABOLIC PANEL WITH GFR
Anion gap: 8 (ref 5–15)
BUN: 17 mg/dL (ref 8–23)
CO2: 25 mmol/L (ref 22–32)
Calcium: 9.5 mg/dL (ref 8.9–10.3)
Chloride: 104 mmol/L (ref 98–111)
Creatinine, Ser: 1.3 mg/dL — ABNORMAL HIGH (ref 0.44–1.00)
GFR, Estimated: 45 mL/min — ABNORMAL LOW (ref >60)
Glucose, Bld: 91 mg/dL (ref 70–99)
Potassium: 4.7 mmol/L (ref 3.5–5.1)
Sodium: 137 mmol/L (ref 135–145)

## 2023-09-17 LAB — TYPE AND SCREEN
ABO/RH(D): O NEG
Antibody Screen: NEGATIVE

## 2023-09-17 NOTE — Progress Notes (Signed)
 PCP - Tretha Fu Cardiologist - Dr. Veryl Gottron End   PPM/ICD - denies   Chest x-ray - n/a EKG - 01/16/23 Stress Test - 12/24/17 ECHO - 07/06/22 Cardiac Cath - 2016  Sleep Study - denies  No DM  Last dose of GLP1 agonist-  n/a GLP1 instructions:  n/a  Blood Thinner Instructions: n/a Aspirin  Instructions: patient states her last dose of Aspirin  was 09/09/23  ERAS Protcol -NPO   COVID TEST- n/a   Anesthesia review: yes  Patient denies shortness of breath, fever, cough and chest pain at PAT appointment   All instructions explained to the patient, with a verbal understanding of the material. Patient agrees to go over the instructions while at home for a better understanding. Patient also instructed to self quarantine after being tested for COVID-19. The opportunity to ask questions was provided.

## 2023-09-18 ENCOUNTER — Other Ambulatory Visit

## 2023-09-18 LAB — T4, FREE: Free T4: 1.2 ng/dL (ref 0.8–1.8)

## 2023-09-18 LAB — TSH: TSH: 3.46 m[IU]/L (ref 0.40–4.50)

## 2023-09-18 NOTE — Anesthesia Preprocedure Evaluation (Addendum)
 Anesthesia Evaluation  Patient identified by MRN, date of birth, ID band Patient awake    Reviewed: Allergy & Precautions, NPO status , Patient's Chart, lab work & pertinent test results  History of Anesthesia Complications (+) PONV and history of anesthetic complications  Airway Mallampati: II  TM Distance: >3 FB Neck ROM: Full    Dental  (+) Teeth Intact, Dental Advisory Given   Pulmonary asthma    Pulmonary exam normal breath sounds clear to auscultation       Cardiovascular hypertension, Pt. on medications Normal cardiovascular exam Rhythm:Regular Rate:Normal     Neuro/Psych  Headaches PSYCHIATRIC DISORDERS Anxiety     SPONDYLOSIS OF CERVICAL REGION WITHOUT MYELOPATHY OR RADICULOPATHY  Neuromuscular disease CVA    GI/Hepatic Neg liver ROS,GERD  Medicated,,  Endo/Other  Hypothyroidism    Renal/GU Renal InsufficiencyRenal disease   IC    Musculoskeletal  (+) Arthritis ,    Abdominal   Peds  (+) ADHD Hematology negative hematology ROS (+)   Anesthesia Other Findings Day of surgery medications reviewed with the patient.  Reproductive/Obstetrics                             Anesthesia Physical Anesthesia Plan  ASA: 3  Anesthesia Plan: General   Post-op Pain Management: Tylenol  PO (pre-op)*   Induction: Intravenous  PONV Risk Score and Plan: 4 or greater and Midazolam , Dexamethasone , Ondansetron  and TIVA  Airway Management Planned: Video Laryngoscope Planned and Oral ETT  Additional Equipment:   Intra-op Plan:   Post-operative Plan: Extubation in OR  Informed Consent: I have reviewed the patients History and Physical, chart, labs and discussed the procedure including the risks, benefits and alternatives for the proposed anesthesia with the patient or authorized representative who has indicated his/her understanding and acceptance.     Dental advisory given  Plan  Discussed with: CRNA  Anesthesia Plan Comments: (2nd PIV after induction.  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  Echo 06/2022 showed EF 60 to 65%, grade 1 DD, normal RV size and function, normal biatrial size, mild mitral digitation.  Coronary CTA 06/2022 showed calcium score of 0, normal coronaries.  Last seen by Dr. Nolan Battle on 01/16/2023, stable at that time, 1 year follow-up recommended.  PFTs 02/01/2022 were normal.  Other pertinent history includes asthma, GERD, chronic headaches, hypothyroid, history of Guillain-Barr syndrome (1982).  History of limited cervical ROM s/p prior C3-4 and C5-7 ACDF.  GlideScope was used electively for intubation 08/16/22.  Preop labs reviewed, creatinine mildly elevated 1.30, otherwise WNL.  EKG 01/16/2023: NSR.  Rate 89.  TTE 07/06/2022: 1. Left ventricular ejection fraction, by estimation, is 60 to 65%. The  left ventricle has normal function. The left ventricle has no regional  wall motion abnormalities. Left ventricular diastolic parameters are  consistent with Grade I diastolic  dysfunction (impaired relaxation).  2. Right ventricular systolic function is normal. The right ventricular  size is normal. Tricuspid regurgitation signal is inadequate for assessing  PA pressure.  3. The mitral valve is normal in structure. Mild mitral valve  regurgitation. No evidence of mitral stenosis.  4. The aortic valve is tricuspid. Aortic valve regurgitation is not  visualized. No aortic stenosis is present.  5. The inferior vena cava is normal  in size with greater than 50%  respiratory variability, suggesting right atrial pressure of 3 mmHg.   Coronary CTA 06/25/2022: IMPRESSION: 1. Normal coronary calcium score of 0. Patient is low  risk.  2. Normal coronary origin with right dominance.  3. No evidence of CAD.  4. CAD-RADS 0. Consider non-atherosclerotic causes of chest pain.  )        Anesthesia Quick Evaluation

## 2023-09-18 NOTE — Progress Notes (Signed)
 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  Echo 06/2022 showed EF 60 to 65%, grade 1 DD, normal RV size and function, normal biatrial size, mild mitral digitation.  Coronary CTA 06/2022 showed calcium score of 0, normal coronaries.  Last seen by Dr. Nolan Battle on 01/16/2023, stable at that time, 1 year follow-up recommended.  PFTs 02/01/2022 were normal.  Other pertinent history includes asthma, GERD, chronic headaches, hypothyroid, history of Guillain-Barr syndrome (1982).  History of limited cervical ROM s/p prior C3-4 and C5-7 ACDF.  GlideScope was used electively for intubation 08/16/22.  Preop labs reviewed, creatinine mildly elevated 1.30, otherwise WNL.  EKG 01/16/2023: NSR.  Rate 89.  TTE 07/06/2022: 1. Left ventricular ejection fraction, by estimation, is 60 to 65%. The  left ventricle has normal function. The left ventricle has no regional  wall motion abnormalities. Left ventricular diastolic parameters are  consistent with Grade I diastolic  dysfunction (impaired relaxation).   2. Right ventricular systolic function is normal. The right ventricular  size is normal. Tricuspid regurgitation signal is inadequate for assessing  PA pressure.   3. The mitral valve is normal in structure. Mild mitral valve  regurgitation. No evidence of mitral stenosis.   4. The aortic valve is tricuspid. Aortic valve regurgitation is not  visualized. No aortic stenosis is present.   5. The inferior vena cava is normal in size with greater than 50%  respiratory variability, suggesting right atrial pressure of 3 mmHg.   Coronary CTA 06/25/2022: IMPRESSION: 1. Normal coronary calcium score of 0. Patient is low risk.   2. Normal coronary origin with right dominance.   3. No evidence of  CAD.   4. CAD-RADS 0. Consider non-atherosclerotic causes of chest pain.    Katie Robbins [facility]/Anesthesiology Phone [phone] 09/18/2023 10:11 AM

## 2023-09-19 ENCOUNTER — Encounter: Payer: Self-pay | Admitting: Internal Medicine

## 2023-09-19 NOTE — Progress Notes (Signed)
 Patient informed of surgical time change on 09/20/2023. Patient o arrive at 83. Patient verbalized understanding.

## 2023-09-20 ENCOUNTER — Ambulatory Visit (HOSPITAL_BASED_OUTPATIENT_CLINIC_OR_DEPARTMENT_OTHER): Admitting: Anesthesiology

## 2023-09-20 ENCOUNTER — Encounter (HOSPITAL_COMMUNITY)
Admission: RE | Disposition: A | Discharge status: Home / Self Care | Payer: Self-pay | Source: Home / Self Care | Attending: Neurosurgery

## 2023-09-20 ENCOUNTER — Ambulatory Visit (HOSPITAL_COMMUNITY): Payer: Self-pay | Admitting: Vascular Surgery

## 2023-09-20 ENCOUNTER — Other Ambulatory Visit: Payer: Self-pay

## 2023-09-20 ENCOUNTER — Ambulatory Visit (HOSPITAL_COMMUNITY)

## 2023-09-20 ENCOUNTER — Ambulatory Visit (HOSPITAL_COMMUNITY)
Admission: RE | Admit: 2023-09-20 | Discharge: 2023-09-21 | Disposition: A | Discharge status: Home / Self Care | Attending: Neurosurgery | Admitting: Neurosurgery

## 2023-09-20 ENCOUNTER — Encounter (HOSPITAL_COMMUNITY): Payer: Self-pay | Admitting: Neurosurgery

## 2023-09-20 DIAGNOSIS — M47812 Spondylosis without myelopathy or radiculopathy, cervical region: Secondary | ICD-10-CM

## 2023-09-20 DIAGNOSIS — E039 Hypothyroidism, unspecified: Secondary | ICD-10-CM | POA: Diagnosis not present

## 2023-09-20 DIAGNOSIS — F909 Attention-deficit hyperactivity disorder, unspecified type: Secondary | ICD-10-CM | POA: Insufficient documentation

## 2023-09-20 DIAGNOSIS — Z79890 Hormone replacement therapy (postmenopausal): Secondary | ICD-10-CM | POA: Insufficient documentation

## 2023-09-20 DIAGNOSIS — M4802 Spinal stenosis, cervical region: Secondary | ICD-10-CM | POA: Diagnosis present

## 2023-09-20 DIAGNOSIS — Z8673 Personal history of transient ischemic attack (TIA), and cerebral infarction without residual deficits: Secondary | ICD-10-CM | POA: Diagnosis not present

## 2023-09-20 DIAGNOSIS — N1831 Chronic kidney disease, stage 3a: Secondary | ICD-10-CM | POA: Diagnosis not present

## 2023-09-20 DIAGNOSIS — Z79899 Other long term (current) drug therapy: Secondary | ICD-10-CM | POA: Diagnosis not present

## 2023-09-20 DIAGNOSIS — N183 Chronic kidney disease, stage 3 unspecified: Secondary | ICD-10-CM | POA: Diagnosis not present

## 2023-09-20 DIAGNOSIS — M2578 Osteophyte, vertebrae: Secondary | ICD-10-CM | POA: Diagnosis not present

## 2023-09-20 DIAGNOSIS — J45909 Unspecified asthma, uncomplicated: Secondary | ICD-10-CM | POA: Insufficient documentation

## 2023-09-20 DIAGNOSIS — I129 Hypertensive chronic kidney disease with stage 1 through stage 4 chronic kidney disease, or unspecified chronic kidney disease: Secondary | ICD-10-CM

## 2023-09-20 DIAGNOSIS — Z981 Arthrodesis status: Secondary | ICD-10-CM | POA: Diagnosis not present

## 2023-09-20 DIAGNOSIS — Z7982 Long term (current) use of aspirin: Secondary | ICD-10-CM | POA: Diagnosis not present

## 2023-09-20 DIAGNOSIS — M532X2 Spinal instabilities, cervical region: Secondary | ICD-10-CM | POA: Insufficient documentation

## 2023-09-20 DIAGNOSIS — Z7951 Long term (current) use of inhaled steroids: Secondary | ICD-10-CM | POA: Insufficient documentation

## 2023-09-20 DIAGNOSIS — F419 Anxiety disorder, unspecified: Secondary | ICD-10-CM | POA: Insufficient documentation

## 2023-09-20 DIAGNOSIS — K219 Gastro-esophageal reflux disease without esophagitis: Secondary | ICD-10-CM | POA: Insufficient documentation

## 2023-09-20 DIAGNOSIS — R519 Headache, unspecified: Secondary | ICD-10-CM | POA: Insufficient documentation

## 2023-09-20 DIAGNOSIS — Z472 Encounter for removal of internal fixation device: Secondary | ICD-10-CM | POA: Diagnosis not present

## 2023-09-20 HISTORY — PX: ANTERIOR CERVICAL DECOMP/DISCECTOMY FUSION: SHX1161

## 2023-09-20 LAB — ABO/RH: ABO/RH(D): O NEG

## 2023-09-20 SURGERY — ANTERIOR CERVICAL DECOMPRESSION/DISCECTOMY FUSION 1 LEVEL
Anesthesia: General

## 2023-09-20 MED ORDER — 0.9 % SODIUM CHLORIDE (POUR BTL) OPTIME
TOPICAL | Status: DC | PRN
  Administered 2023-09-20: 1000 mL

## 2023-09-20 MED ORDER — FENTANYL CITRATE (PF) 100 MCG/2ML IJ SOLN
25.0000 ug | INTRAMUSCULAR | Status: DC | PRN
  Administered 2023-09-20 (×2): 50 ug via INTRAVENOUS

## 2023-09-20 MED ORDER — PROPOFOL 10 MG/ML IV BOLUS
INTRAVENOUS | Status: DC | PRN
  Administered 2023-09-20: 120 mg via INTRAVENOUS

## 2023-09-20 MED ORDER — CHLORHEXIDINE GLUCONATE CLOTH 2 % EX PADS
6.0000 | MEDICATED_PAD | Freq: Once | CUTANEOUS | Status: DC
Start: 2023-09-20 — End: 2023-09-20

## 2023-09-20 MED ORDER — DEXAMETHASONE SODIUM PHOSPHATE 10 MG/ML IJ SOLN
INTRAMUSCULAR | Status: DC | PRN
  Administered 2023-09-20: 10 mg via INTRAVENOUS

## 2023-09-20 MED ORDER — SUGAMMADEX SODIUM 200 MG/2ML IV SOLN
INTRAVENOUS | Status: DC | PRN
  Administered 2023-09-20: 200 mg via INTRAVENOUS

## 2023-09-20 MED ORDER — MIDAZOLAM HCL 2 MG/2ML IJ SOLN
INTRAMUSCULAR | Status: DC | PRN
  Administered 2023-09-20: 2 mg via INTRAVENOUS

## 2023-09-20 MED ORDER — AMISULPRIDE (ANTIEMETIC) 5 MG/2ML IV SOLN: 10.0000 mg | Freq: Once | INTRAVENOUS | Status: DC | PRN

## 2023-09-20 MED ORDER — PROPOFOL 500 MG/50ML IV EMUL
INTRAVENOUS | Status: DC | PRN
  Administered 2023-09-20: 150 ug/kg/min via INTRAVENOUS

## 2023-09-20 MED ORDER — PANTOPRAZOLE SODIUM 40 MG PO TBEC
40.0000 mg | DELAYED_RELEASE_TABLET | Freq: Every day | ORAL | Status: DC
  Administered 2023-09-20: 40 mg via ORAL
  Filled 2023-09-20: qty 1

## 2023-09-20 MED ORDER — CHLORHEXIDINE GLUCONATE 0.12 % MT SOLN: 15.0000 mL | Freq: Once | OROMUCOSAL | Status: AC

## 2023-09-20 MED ORDER — ACETAMINOPHEN 650 MG RE SUPP: 650.0000 mg | RECTAL | Status: DC | PRN

## 2023-09-20 MED ORDER — PROPOFOL 10 MG/ML IV BOLUS
INTRAVENOUS | Status: AC
  Filled 2023-09-20: qty 20

## 2023-09-20 MED ORDER — ROCURONIUM BROMIDE 10 MG/ML (PF) SYRINGE
PREFILLED_SYRINGE | INTRAVENOUS | Status: DC | PRN
  Administered 2023-09-20: 30 mg via INTRAVENOUS
  Administered 2023-09-20: 10 mg via INTRAVENOUS
  Administered 2023-09-20: 50 mg via INTRAVENOUS

## 2023-09-20 MED ORDER — POLYVINYL ALCOHOL 1.4 % OP SOLN: 1.0000 [drp] | Freq: Three times a day (TID) | OPHTHALMIC | Status: DC | PRN

## 2023-09-20 MED ORDER — OXYCODONE HCL 5 MG PO TABS
10.0000 mg | ORAL_TABLET | ORAL | Status: DC | PRN
  Administered 2023-09-20 – 2023-09-21 (×3): 10 mg via ORAL
  Filled 2023-09-20 (×3): qty 2

## 2023-09-20 MED ORDER — LIDOCAINE 2% (20 MG/ML) 5 ML SYRINGE
INTRAMUSCULAR | Status: DC | PRN
  Administered 2023-09-20: 100 mg via INTRAVENOUS

## 2023-09-20 MED ORDER — LEVOTHYROXINE SODIUM 100 MCG PO TABS
100.0000 ug | ORAL_TABLET | Freq: Every day | ORAL | Status: DC
  Administered 2023-09-21: 100 ug via ORAL
  Filled 2023-09-20: qty 1

## 2023-09-20 MED ORDER — MIDAZOLAM HCL 2 MG/2ML IJ SOLN
INTRAMUSCULAR | Status: AC
  Filled 2023-09-20: qty 2

## 2023-09-20 MED ORDER — ALUM & MAG HYDROXIDE-SIMETH 200-200-20 MG/5ML PO SUSP: 30.0000 mL | Freq: Four times a day (QID) | ORAL | Status: DC | PRN

## 2023-09-20 MED ORDER — AMPHETAMINE-DEXTROAMPHETAMINE 10 MG PO TABS
20.0000 mg | ORAL_TABLET | Freq: Two times a day (BID) | ORAL | Status: DC
  Administered 2023-09-21: 20 mg via ORAL
  Filled 2023-09-20: qty 2

## 2023-09-20 MED ORDER — DIAZEPAM 5 MG PO TABS: 5.0000 mg | ORAL_TABLET | Freq: Four times a day (QID) | ORAL | Status: DC | PRN

## 2023-09-20 MED ORDER — FENTANYL CITRATE (PF) 100 MCG/2ML IJ SOLN
INTRAMUSCULAR | Status: AC
Start: 2023-09-20 — End: 2023-09-21
  Filled 2023-09-20: qty 2

## 2023-09-20 MED ORDER — MENTHOL 3 MG MT LOZG: 1.0000 | LOZENGE | OROMUCOSAL | Status: DC | PRN

## 2023-09-20 MED ORDER — CEFAZOLIN SODIUM-DEXTROSE 2-4 GM/100ML-% IV SOLN
INTRAVENOUS | Status: AC
  Filled 2023-09-20: qty 100

## 2023-09-20 MED ORDER — CHLORHEXIDINE GLUCONATE CLOTH 2 % EX PADS: 6.0000 | MEDICATED_PAD | Freq: Once | CUTANEOUS | Status: DC

## 2023-09-20 MED ORDER — SODIUM CHLORIDE 0.9 % IV SOLN
250.0000 mL | INTRAVENOUS | Status: DC
  Administered 2023-09-20: 250 mL via INTRAVENOUS

## 2023-09-20 MED ORDER — PHENOL 1.4 % MT LIQD: 1.0000 | OROMUCOSAL | Status: DC | PRN

## 2023-09-20 MED ORDER — THROMBIN (RECOMBINANT) 5000 UNITS EX SOLR
CUTANEOUS | Status: DC | PRN
  Administered 2023-09-20: 10 mL via TOPICAL

## 2023-09-20 MED ORDER — ACETAMINOPHEN 325 MG PO TABS: 650.0000 mg | ORAL_TABLET | ORAL | Status: DC | PRN

## 2023-09-20 MED ORDER — CEFAZOLIN SODIUM-DEXTROSE 2-4 GM/100ML-% IV SOLN
2.0000 g | INTRAVENOUS | Status: AC
  Administered 2023-09-20: 2 g via INTRAVENOUS

## 2023-09-20 MED ORDER — PANTOPRAZOLE SODIUM 40 MG IV SOLR: 40.0000 mg | Freq: Every day | INTRAVENOUS | Status: DC

## 2023-09-20 MED ORDER — CEFAZOLIN SODIUM-DEXTROSE 2-4 GM/100ML-% IV SOLN
2.0000 g | Freq: Three times a day (TID) | INTRAVENOUS | Status: AC
  Administered 2023-09-20 – 2023-09-21 (×2): 2 g via INTRAVENOUS
  Filled 2023-09-20 (×2): qty 100

## 2023-09-20 MED ORDER — CHLORHEXIDINE GLUCONATE 0.12 % MT SOLN
OROMUCOSAL | Status: AC
  Administered 2023-09-20: 15 mL via OROMUCOSAL
  Filled 2023-09-20: qty 15

## 2023-09-20 MED ORDER — LACTATED RINGERS IV SOLN
INTRAVENOUS | Status: DC
Start: 2023-09-20 — End: 2023-09-20

## 2023-09-20 MED ORDER — ESTRADIOL 0.5 MG PO TABS
1.0000 mg | ORAL_TABLET | Freq: Every day | ORAL | Status: DC
  Administered 2023-09-21: 1 mg via ORAL
  Filled 2023-09-20: qty 2

## 2023-09-20 MED ORDER — ORAL CARE MOUTH RINSE: 15.0000 mL | Freq: Once | OROMUCOSAL | Status: AC

## 2023-09-20 MED ORDER — FENTANYL CITRATE (PF) 250 MCG/5ML IJ SOLN
INTRAMUSCULAR | Status: AC
  Filled 2023-09-20: qty 5

## 2023-09-20 MED ORDER — ONDANSETRON HCL 4 MG PO TABS: 4.0000 mg | ORAL_TABLET | Freq: Four times a day (QID) | ORAL | Status: DC | PRN

## 2023-09-20 MED ORDER — SODIUM CHLORIDE 0.9% FLUSH
3.0000 mL | Freq: Two times a day (BID) | INTRAVENOUS | Status: DC
  Administered 2023-09-21: 3 mL via INTRAVENOUS

## 2023-09-20 MED ORDER — THROMBIN 5000 UNITS EX KIT
PACK | CUTANEOUS | Status: AC
  Filled 2023-09-20: qty 1

## 2023-09-20 MED ORDER — IRBESARTAN 75 MG PO TABS
37.5000 mg | ORAL_TABLET | Freq: Every day | ORAL | Status: DC
  Administered 2023-09-20 – 2023-09-21 (×2): 37.5 mg via ORAL
  Filled 2023-09-20 (×2): qty 0.5

## 2023-09-20 MED ORDER — ASPIRIN 81 MG PO TBEC
81.0000 mg | DELAYED_RELEASE_TABLET | Freq: Every morning | ORAL | Status: DC
  Administered 2023-09-21: 81 mg via ORAL
  Filled 2023-09-20: qty 1

## 2023-09-20 MED ORDER — AMLODIPINE BESYLATE 5 MG PO TABS
5.0000 mg | ORAL_TABLET | Freq: Every day | ORAL | Status: DC
  Administered 2023-09-21: 5 mg via ORAL
  Filled 2023-09-20: qty 1

## 2023-09-20 MED ORDER — FENTANYL CITRATE (PF) 100 MCG/2ML IJ SOLN
INTRAMUSCULAR | Status: AC
  Administered 2023-09-20: 50 ug via INTRAVENOUS
  Filled 2023-09-20: qty 2

## 2023-09-20 MED ORDER — ONDANSETRON HCL 4 MG/2ML IJ SOLN: 4.0000 mg | Freq: Once | INTRAMUSCULAR | Status: DC | PRN

## 2023-09-20 MED ORDER — THROMBIN 5000 UNITS EX SOLR
OROMUCOSAL | Status: DC | PRN
  Administered 2023-09-20 (×2): 5 mL via TOPICAL

## 2023-09-20 MED ORDER — FENTANYL CITRATE (PF) 250 MCG/5ML IJ SOLN
INTRAMUSCULAR | Status: DC | PRN
  Administered 2023-09-20 (×2): 50 ug via INTRAVENOUS
  Administered 2023-09-20: 100 ug via INTRAVENOUS
  Administered 2023-09-20: 50 ug via INTRAVENOUS

## 2023-09-20 MED ORDER — FLUTICASONE FUROATE-VILANTEROL 200-25 MCG/ACT IN AEPB: 1.0000 | INHALATION_SPRAY | Freq: Every day | RESPIRATORY_TRACT | Status: DC

## 2023-09-20 MED ORDER — SODIUM CHLORIDE 0.9% FLUSH: 3.0000 mL | INTRAVENOUS | Status: DC | PRN

## 2023-09-20 MED ORDER — HYDROMORPHONE HCL 1 MG/ML IJ SOLN: 0.5000 mg | INTRAMUSCULAR | Status: DC | PRN

## 2023-09-20 MED ORDER — THROMBIN 5000 UNITS EX KIT
PACK | CUTANEOUS | Status: AC
  Filled 2023-09-20: qty 3

## 2023-09-20 MED ORDER — FENTANYL CITRATE (PF) 100 MCG/2ML IJ SOLN: 50.0000 ug | Freq: Once | INTRAMUSCULAR | Status: AC

## 2023-09-20 MED ORDER — CYCLOBENZAPRINE HCL 10 MG PO TABS: 10.0000 mg | ORAL_TABLET | Freq: Three times a day (TID) | ORAL | Status: DC | PRN

## 2023-09-20 MED ORDER — ESTRADIOL 0.1 MG/GM VA CREA: 0.2500 | TOPICAL_CREAM | Freq: Every day | VAGINAL | Status: DC

## 2023-09-20 MED ORDER — ONDANSETRON HCL 4 MG/2ML IJ SOLN
INTRAMUSCULAR | Status: DC | PRN
  Administered 2023-09-20: 4 mg via INTRAVENOUS

## 2023-09-20 MED ORDER — ALBUTEROL SULFATE (2.5 MG/3ML) 0.083% IN NEBU: 3.0000 mL | INHALATION_SOLUTION | Freq: Four times a day (QID) | RESPIRATORY_TRACT | Status: DC | PRN

## 2023-09-20 MED ORDER — ONDANSETRON HCL 4 MG/2ML IJ SOLN: 4.0000 mg | Freq: Four times a day (QID) | INTRAMUSCULAR | Status: DC | PRN

## 2023-09-20 SURGICAL SUPPLY — 52 items
BAG COUNTER SPONGE SURGICOUNT (BAG) ×1 IMPLANT
BAND RUBBER #18 3X1/16 STRL (MISCELLANEOUS) ×2 IMPLANT
BASKET BONE COLLECTION (BASKET) ×1 IMPLANT
BENZOIN TINCTURE PRP APPL 2/3 (GAUZE/BANDAGES/DRESSINGS) ×1 IMPLANT
BIT DRILL NEURO 2X3.1 SFT TUCH (MISCELLANEOUS) ×1 IMPLANT
BUR MATCHSTICK NEURO 3.0 LAGG (BURR) ×1 IMPLANT
CANISTER SUCTION 3000ML PPV (SUCTIONS) ×1 IMPLANT
CHIPS BONE CANC-ACS11X14X6 6D (Bone Implant) IMPLANT
DERMABOND ADVANCED .7 DNX12 (GAUZE/BANDAGES/DRESSINGS) IMPLANT
DRAPE C-ARM 42X72 X-RAY (DRAPES) ×2 IMPLANT
DRAPE LAPAROTOMY 100X72 PEDS (DRAPES) ×1 IMPLANT
DRAPE MICROSCOPE SLANT 54X150 (MISCELLANEOUS) ×1 IMPLANT
DRSG OPSITE POSTOP 4X6 (GAUZE/BANDAGES/DRESSINGS) IMPLANT
DURAPREP 6ML APPLICATOR 50/CS (WOUND CARE) ×1 IMPLANT
ELECT COATED BLADE 2.86 ST (ELECTRODE) ×1 IMPLANT
ELECTRODE REM PT RTRN 9FT ADLT (ELECTROSURGICAL) ×1 IMPLANT
GAUZE 4X4 16PLY ~~LOC~~+RFID DBL (SPONGE) IMPLANT
GAUZE SPONGE 4X4 12PLY STRL (GAUZE/BANDAGES/DRESSINGS) ×1 IMPLANT
GLOVE BIO SURGEON STRL SZ7 (GLOVE) IMPLANT
GLOVE BIO SURGEON STRL SZ8 (GLOVE) ×1 IMPLANT
GLOVE BIOGEL PI IND STRL 7.0 (GLOVE) IMPLANT
GLOVE EXAM NITRILE XL STR (GLOVE) IMPLANT
GLOVE INDICATOR 8.5 STRL (GLOVE) ×2 IMPLANT
GOWN STRL REUS W/ TWL LRG LVL3 (GOWN DISPOSABLE) ×1 IMPLANT
GOWN STRL REUS W/ TWL XL LVL3 (GOWN DISPOSABLE) ×1 IMPLANT
GOWN STRL REUS W/TWL 2XL LVL3 (GOWN DISPOSABLE) ×1 IMPLANT
HALTER HD/CHIN CERV TRACTION D (MISCELLANEOUS) ×1 IMPLANT
HEMOSTAT POWDER KIT SURGIFOAM (HEMOSTASIS) ×1 IMPLANT
KIT BASIN OR (CUSTOM PROCEDURE TRAY) ×1 IMPLANT
KIT TURNOVER KIT B (KITS) ×1 IMPLANT
NDL HYPO 18GX1.5 BLUNT FILL (NEEDLE) ×1 IMPLANT
NDL SPNL 20GX3.5 QUINCKE YW (NEEDLE) ×1 IMPLANT
NEEDLE HYPO 18GX1.5 BLUNT FILL (NEEDLE) ×1 IMPLANT
NEEDLE SPNL 20GX3.5 QUINCKE YW (NEEDLE) ×1 IMPLANT
NS IRRIG 1000ML POUR BTL (IV SOLUTION) ×1 IMPLANT
PACK LAMINECTOMY NEURO (CUSTOM PROCEDURE TRAY) ×1 IMPLANT
PAD ARMBOARD POSITIONER FOAM (MISCELLANEOUS) ×3 IMPLANT
PATTIES SURGICAL .5 X.5 (GAUZE/BANDAGES/DRESSINGS) ×1 IMPLANT
PATTIES SURGICAL 1X1 (DISPOSABLE) ×1 IMPLANT
PIN DISTRACTION 14MM (PIN) IMPLANT
PLATE CERV RES ACP 16 1L (Plate) IMPLANT
SCREW VA SD 4.6X14 (Screw) IMPLANT
SPONGE INTESTINAL PEANUT (DISPOSABLE) ×1 IMPLANT
SPONGE SURGIFOAM ABS GEL SZ50 (HEMOSTASIS) ×1 IMPLANT
STRIP CLOSURE SKIN 1/2X4 (GAUZE/BANDAGES/DRESSINGS) ×1 IMPLANT
SUT VIC AB 3-0 SH 8-18 (SUTURE) ×1 IMPLANT
SUT VIC AB 4-0 PS2 18 (SUTURE) ×1 IMPLANT
SYR 30ML SLIP (SYRINGE) ×1 IMPLANT
TAPE CLOTH 4X10 WHT NS (GAUZE/BANDAGES/DRESSINGS) ×1 IMPLANT
TOWEL GREEN STERILE (TOWEL DISPOSABLE) ×1 IMPLANT
TOWEL GREEN STERILE FF (TOWEL DISPOSABLE) ×1 IMPLANT
WATER STERILE IRR 1000ML POUR (IV SOLUTION) ×1 IMPLANT

## 2023-09-20 NOTE — Progress Notes (Signed)
 Patient arrived to room. VSS. Honeycomb dressing-CDI. Bed alarm on and call bell within reach.

## 2023-09-20 NOTE — Transfer of Care (Signed)
 Immediate Anesthesia Transfer of Care Note  Patient: Katie Robbins  Procedure(s) Performed: ANTERIOR CERVICAL DECOMPRESSION/DISCECTOMY FUSION CERVICAL FOUR-FIVE  Patient Location: PACU  Anesthesia Type:General  Level of Consciousness: drowsy  Airway & Oxygen Therapy: Patient Spontanous Breathing and Patient connected to face mask oxygen  Post-op Assessment: Report given to RN and Post -op Vital signs reviewed and stable  Post vital signs: Reviewed and stable  Last Vitals:  Vitals Value Taken Time  BP 156/84 09/20/23 1700  Temp 36.1 C 09/20/23 1641  Pulse 78 09/20/23 1700  Resp 15 09/20/23 1700  SpO2 99 % 09/20/23 1700  Vitals shown include unfiled device data.  Last Pain:  Vitals:   09/20/23 1142  TempSrc:   PainSc: 10-Worst pain ever         Complications: No notable events documented.

## 2023-09-20 NOTE — Progress Notes (Signed)
 Orthopedic Tech Progress Note Patient Details:  Katie Robbins [birth year] [id number]  Ortho Devices Type of Ortho Device: Soft collar Ortho Device/Splint Location: NECK Ortho Device/Splint Interventions: Ordered, Application   Post Interventions Patient Tolerated: Well Instructions Provided: Care of device  [name] 09/20/2023, 6:39 PM

## 2023-09-20 NOTE — Plan of Care (Signed)

## 2023-09-20 NOTE — Op Note (Signed)
 Preoperative diagnosis: Cervical spondylosis with stenosis and instability C4-5.  Postoperative diagnosis: Same.  Procedure: Anterior cervical discectomy and fusion C4-5 utilizing allograft and the globus resonate plating system.  2.  Exploration fusion removal of hardware with removal of plate and screws at C3-4 and removal of plate and screws at C5-7.  Surgeon: Gearl Keens.  Assistant: Beula Brunswick.  Anesthesia: General.  EBL: Minimal.  HPI: 67-year female longstanding neck pain bilateral shoulder pain workup showed previous fusions that appear to be solid at C3-4 and C5-7 but island between with spondylosis that is progressed at C4-5 with evidence of motion on flexion-extension films.  Due to her progression of clinical syndrome imaging findings failed conservative treatment I recommended anterior cervical discectomy and fusion at C4-5 with possible removal of hardware C3-4 and C5-7.  I extensively reviewed the risks and benefits of the operation with the patient as well as perioperative course expectations of outcome and alternatives to surgery and she understood and agreed to proceed forward.  Operative procedure: Patient was brought into the OR was induced under general anesthesia positioned supine the neck in slight extension 5 pounds of halter traction.  The right side of her neck was prepped and draped in routine sterile fashion preoperative x-ray localized the previous 3 4 incision to be adequate to do the 4 5 fusion on so utilize at incision so a curvilinear incision was made through the old scar and the avascular plane was developed down to the prevertebral fascia which was dissected away with Kitners.  Immediately identified the 2 oh plates removed the bottom plate then performed a discectomy under microscopic lamination drilling down the posterior annulus and osteophyte complex decompress central canal and both C5 pedicles were identified both C5 nerve roots were decompressed  skeletonized flush with pedicle.  I sized up a 6 mm allograft spacer and inserted it then it showed that the superior plate was also in the way it was not Cabbell the work below it so I removed it remove the superior plate as well.  We then placed a 16 mm globus resonate plate utilizing old holes with rescue screws and all screws had excellent purchase locking mechanism was engaged.  Postop imaging confirmed good position of all the implants.  The wound was then copiously irrigated meticulous hemostasis was maintained the wound was then closed in layers with interrupted Vicryl and the platysma and a running fast 4-0 subcuticular Dermabond benzoin Steri-Strips and a sterile dressing was applied and patient went to cover room in stable condition.  At the end the case all needle count sponge counts were correct.

## 2023-09-20 NOTE — Anesthesia Procedure Notes (Signed)
 Procedure Name: Intubation Date/Time: 09/20/2023 2:54 PM  Performed by: Sherma Diver, CRNAPre-anesthesia Checklist: Patient identified, Patient being monitored, Timeout performed, Emergency Drugs available and Suction available Patient Re-evaluated:Patient Re-evaluated prior to induction Oxygen Delivery Method: Circle system utilized Preoxygenation: Pre-oxygenation with 100% oxygen Induction Type: IV induction Ventilation: Mask ventilation without difficulty Laryngoscope Size: 3 and McGrath Grade View: Grade I Tube type: Oral Tube size: 7.5 mm Number of attempts: 1 Airway Equipment and Method: Stylet Placement Confirmation: ETT inserted through vocal cords under direct vision, positive ETCO2 and breath sounds checked- equal and bilateral Secured at: 21 cm Tube secured with: Tape Dental Injury: Teeth and Oropharynx as per pre-operative assessment

## 2023-09-20 NOTE — H&P (Signed)
 Katie Robbins is an 68 y.o. female.   Chief Complaint: Neck pain shoulder pain HPI: 68 year old female with longstanding issues with the neck previously undergone anterior cervical discectomy fusion at C3-4 and C5-7 she has got an [place] disc base at C4-5 with instability.  She is now progressive neck pain and pain into both shoulders so I recommended an ACDF at C4-5.  We have extensively gone over the risks and benefits of the operation with her as well as perioperative course expectations of outcome and alternatives to surgery and she understands and agrees to proceed forward.  Past Medical History:  Diagnosis Date   Acute thoracic back pain 07/22/2018   ADHD (attention deficit hyperactivity disorder)    Anemia    Abnormal Uterine Bleeding - Resulted in Hysterectomy   Anxiety    Arthritis    Asthma    Chicken pox    Chronic headaches    Chronic kidney disease    CKD Stage 3a. No HD   Cognitive disorder    Concussion 07/2021   Diverticulitis    Essential hypertension    GERD (gastroesophageal reflux disease)    Guillain Barr syndrome (HCC) 1982   H/O breast implant    Heart murmur    Hypertension    Hypothyroidism    Interstitial cystitis    Interstitial cystitis    Mitral prolapse 1985   Non-cardiac chest pain 06/13/2020   Nutcracker esophagus    Osteoarthritis    PONV (postoperative nausea and vomiting)    patient states that she does well with phenergan    Soft tissue mass 03/31/2020   Stroke (HCC)    Multiple TIAs. Last one Feb. 2024    Past Surgical History:  Procedure Laterality Date   ABDOMINAL HYSTERECTOMY  2000/2009   APPENDECTOMY  2007   AUGMENTATION MAMMAPLASTY     BELPHAROPTOSIS REPAIR Bilateral    Cosmetic. Eye Lift   Bilateral Cervical Two ganglionectomies  08/2022   BOWEL RESECTION  2007   CARDIAC CATHETERIZATION  2016   Clean   CARPAL TUNNEL RELEASE Bilateral    CHOLECYSTECTOMY  2012   COLONOSCOPY WITH PROPOFOL  N/A 04/13/2020   Procedure:  COLONOSCOPY WITH PROPOFOL ;  Surgeon: Selena Daily, MD;  Location: [facility] ENDOSCOPY;  Service: Gastroenterology;  Laterality: N/A;   ESOPHAGOGASTRODUODENOSCOPY (EGD) WITH PROPOFOL  N/A 04/13/2020   Procedure: ESOPHAGOGASTRODUODENOSCOPY (EGD) WITH PROPOFOL ;  Surgeon: Selena Daily, MD;  Location: [facility] ENDOSCOPY;  Service: Gastroenterology;  Laterality: N/A;   ESOPHAGOGASTRODUODENOSCOPY (EGD) WITH PROPOFOL  N/A 06/06/2023   Procedure: ESOPHAGOGASTRODUODENOSCOPY (EGD) WITH PROPOFOL ;  Surgeon: Selena Daily, MD;  Location: [facility] ENDOSCOPY;  Service: Gastroenterology;  Laterality: N/A;   NECK SURGERY  06/11/2016   Fusion C3-C7 Dr. Lamon Pillow   POSTERIOR CERVICAL LAMINECTOMY Bilateral 08/16/2022   Procedure: Bilateral Cervical Two ganglionectomies;  Surgeon: Cannon Champion, MD;  Location: [facility] OR;  Service: Neurosurgery;  Laterality: Bilateral;   SMALL INTESTINE SURGERY  2007   Bowel resection   TONSILLECTOMY     VESICO-VAGINAL FISTULA REPAIR  2007    Family History  Problem Relation Age of Onset   Hypertension Mother    GER disease Mother    Polymyalgia rheumatica Mother    COPD Mother    Osteoarthritis Mother    Osteoporosis Mother    Other Mother        Giant Cell Arteritis    GER disease Father    Heart attack Father    Spondylolysis Father    Social History:  reports that  she has never smoked. She has never used smokeless tobacco. She reports current alcohol use of about 3.0 standard drinks of alcohol per week. She reports that she does not use drugs.  Allergies:  Allergies  Allergen Reactions   Bactrim [Sulfamethoxazole-Trimethoprim] Hives   Macrobid [Nitrofurantoin Macrocrystal] Hives   Tizanidine Other (See Comments)    Bad Mood change Migraines   Vicodin [Hydrocodone-Acetaminophen ] Swelling    Puffy eyes and face   Trileptal  [Oxcarbazepine ] Rash   Abilify [Aripiprazole] Other (See Comments)    Bad leg cramps   Acetaminophen      Other reaction(s): Puffy  face/eyes Patient states that she is able to take acetaminophen    Amaryl [Glimepiride] Other (See Comments)    Not effective   Ambien  [Zolpidem ] Other (See Comments)    Did not work   Cyclobenzaprine Other (See Comments)    Not effective   Erythromycin Base     Other Reaction(s): Rash   Gabapentin Other (See Comments)    XR makes her Groggy   Hydrocodone Swelling    Eyes and face   Klonopin [Clonazepam] Other (See Comments)    Make her feel groggy   Nexium [Esomeprazole Magnesium] Nausea And Vomiting    Face swelling, hives    Nortriptyline     insomnia   Olanzapine Other (See Comments)    Not effective   Pregabalin Other (See Comments)    Not effective   Prilosec [Omeprazole] Nausea And Vomiting    Face swelling, hives    Quetiapine Other (See Comments)    Groggy   Reglan  [Metoclopramide ] Nausea Only   Savella [Milnacipran] Other (See Comments)    Not effective   Sulfa Antibiotics Hives   Tamoxifen    Tapentadol Other (See Comments)    Not effective   Topiramate Other (See Comments)    Not effective   Trimethoprim     Other reaction(s): Hives   Wasp Venom Hives    swelling   Zoloft  [Sertraline ] Other (See Comments)    Not effective   Erythromycin Rash   Lamotrigine  Rash    Bad mood swings   Latex Rash   Molds & Smuts Anxiety    Black Mold   Tramadol Nausea Only    Not effective    Medications Prior to Admission  Medication Sig Dispense Refill   albuterol  (VENTOLIN  HFA) 108 (90 Base) MCG/ACT inhaler Inhale 2 puffs into the lungs every 6 (six) hours as needed for wheezing or shortness of breath. 8.5 g 0   Alum & Mag Hydroxide-Simeth (GI COCKTAIL) SUSP suspension Take 20 mLs by mouth 2 (two) times daily. Maalox 30 mL, lidocaine  5 mL and dicyclomine 10 mL, dispense 450 mL,  SIG take 15 to 20 mL as needed 3-4 times daily. (Patient taking differently: Take 15-20 mLs by mouth 2 (two) times daily as needed (esophageal spasm). Maalox 30 mL, lidocaine  5 mL and  dicyclomine 10 mL, dispense 450 mL,  SIG take 15 to 20 mL as needed 3-4 times daily.) 1200 mL 1   amLODipine  (NORVASC ) 5 MG tablet TAKE ONE TABLET BY MOUTH ONCE DAILY FOR BLOOD PRESSURE 90 tablet 2   amphetamine -dextroamphetamine  (ADDERALL) 20 MG tablet Take 20 mg by mouth See admin instructions. Take 1 tablet (20 mg) by mouth scheduled in the morning & in the afternoon, may take an additional dose in the evening, if needed for focus/attention.     budesonide -formoterol  (SYMBICORT ) 160-4.5 MCG/ACT inhaler Inhale 2 puffs into the lungs 2 (two) times daily. (Patient  taking differently: Inhale 2 puffs into the lungs 2 (two) times daily as needed (respiratory issues.).) 10.2 g 4   carboxymethylcellulose (REFRESH PLUS) 0.5 % SOLN Place 1-2 drops into both eyes 3 (three) times daily as needed (dry/irritated eyes.).     diazepam  (VALIUM ) 5 MG tablet Take 5 mg by mouth 4 (four) times daily as needed for anxiety.     EPINEPHrine  0.3 mg/0.3 mL IJ SOAJ injection Inject 0.3 mLs (0.3 mg total) into the muscle as needed for anaphylaxis. 1 each 0   estradiol  (ESTRACE ) 1 MG tablet Take 1 tablet (1 mg total) by mouth daily. 30 tablet 11   levothyroxine  (SYNTHROID ) 100 MCG tablet TAKE ONE TABLET BY MOUTH MONDAY THROUGH SATURDAYS 72 tablet 3   levothyroxine  (SYNTHROID ) 112 MCG tablet TAKE ONE TABLET (112 MCG TOTAL) BY MOUTH AS DIRECTED ON SUNDAYS 12 tablet 3   olmesartan  (BENICAR ) 20 MG tablet TAKE ONE TABLET (20 MG TOTAL) BY MOUTH DAILY. 90 tablet 1   pantoprazole  (PROTONIX ) 40 MG tablet TAKE ONE TABLET (40 MG TOTAL) BY MOUTH DAILY. FOR HEARTBURN. (Patient taking differently: Take 40 mg by mouth daily before supper.) 90 tablet 3   aspirin  EC 81 MG tablet Take 1 tablet (81 mg total) by mouth in the morning. 30 tablet 12   cyanocobalamin  (VITAMIN B12) 1000 MCG/ML injection INJECT ONE VIAL (1 ML) INTO THE MUSCLE EVERY THREE TO FOUR WEEKS 12 mL 0   estradiol  (ESTRACE ) 0.1 MG/GM vaginal cream Place 0.25 Applicatorfuls  vaginally at bedtime. 90 g 3    Results for orders placed or performed during the hospital encounter of 09/20/23 (from the past 48 hours)  ABO/Rh     Status: None   Collection Time: 09/20/23 10:00 AM  Result Value Ref Range   ABO/RH(D)      O NEG Performed at [facility], [address], [place], [place] [address]    No results found.  Review of Systems  Musculoskeletal:  Positive for neck pain.  Neurological:  Positive for headaches.    Blood pressure 131/86, pulse 83, temperature 98.9 F (37.2 C), temperature source Oral, resp. rate 18, height 5\' 3"  (1.6 m), weight 60.8 kg, SpO2 98%. Physical Exam HENT:     Head: Normocephalic.     Right Ear: Tympanic membrane normal.     Nose: Nose normal.     Mouth/Throat:     Mouth: Mucous membranes are moist.  Cardiovascular:     Rate and Rhythm: Normal rate.     Pulses: Normal pulses.  Pulmonary:     Effort: Pulmonary effort is normal.  Abdominal:     General: Abdomen is flat.  Musculoskeletal:     Cervical back: Normal range of motion.  Neurological:     Mental Status: She is alert.     Comments: Patient is awake alert strength is 5 out of 5 deltoid, bicep, tricep, wrist flexion, wrist extension, hand intrinsics.      Assessment/Plan Patient presents for ACDF C4-5  Ferris Hua, MD 09/20/2023, 1:45 PM

## 2023-09-21 DIAGNOSIS — R519 Headache, unspecified: Secondary | ICD-10-CM | POA: Diagnosis not present

## 2023-09-21 DIAGNOSIS — Z7982 Long term (current) use of aspirin: Secondary | ICD-10-CM | POA: Diagnosis not present

## 2023-09-21 DIAGNOSIS — J45909 Unspecified asthma, uncomplicated: Secondary | ICD-10-CM | POA: Diagnosis not present

## 2023-09-21 DIAGNOSIS — Z981 Arthrodesis status: Secondary | ICD-10-CM | POA: Diagnosis not present

## 2023-09-21 DIAGNOSIS — K219 Gastro-esophageal reflux disease without esophagitis: Secondary | ICD-10-CM | POA: Diagnosis not present

## 2023-09-21 DIAGNOSIS — Z8673 Personal history of transient ischemic attack (TIA), and cerebral infarction without residual deficits: Secondary | ICD-10-CM | POA: Diagnosis not present

## 2023-09-21 DIAGNOSIS — M47812 Spondylosis without myelopathy or radiculopathy, cervical region: Secondary | ICD-10-CM | POA: Diagnosis not present

## 2023-09-21 DIAGNOSIS — Z7951 Long term (current) use of inhaled steroids: Secondary | ICD-10-CM | POA: Diagnosis not present

## 2023-09-21 DIAGNOSIS — M532X2 Spinal instabilities, cervical region: Secondary | ICD-10-CM | POA: Diagnosis not present

## 2023-09-21 DIAGNOSIS — M4802 Spinal stenosis, cervical region: Secondary | ICD-10-CM | POA: Diagnosis not present

## 2023-09-21 DIAGNOSIS — M2578 Osteophyte, vertebrae: Secondary | ICD-10-CM | POA: Diagnosis not present

## 2023-09-21 DIAGNOSIS — N1831 Chronic kidney disease, stage 3a: Secondary | ICD-10-CM | POA: Diagnosis not present

## 2023-09-21 DIAGNOSIS — E039 Hypothyroidism, unspecified: Secondary | ICD-10-CM | POA: Diagnosis not present

## 2023-09-21 DIAGNOSIS — Z79899 Other long term (current) drug therapy: Secondary | ICD-10-CM | POA: Diagnosis not present

## 2023-09-21 DIAGNOSIS — I129 Hypertensive chronic kidney disease with stage 1 through stage 4 chronic kidney disease, or unspecified chronic kidney disease: Secondary | ICD-10-CM | POA: Diagnosis not present

## 2023-09-21 MED ORDER — OXYCODONE HCL 10 MG PO TABS
10.0000 mg | ORAL_TABLET | ORAL | 0 refills | Status: DC | PRN
Start: 2023-09-21 — End: 2023-11-13

## 2023-09-21 NOTE — Discharge Instructions (Signed)
Anterior Cervical Fusion Care After Pinching of the nerves is a common cause of long-term pain. When this happens, a procedure called an anterior cervical fusion is sometimes performed. It relieves the pressure on the pinched nerve roots or spinal cord in the neck. An anterior cervical fusion means that the operation is done through the front (anterior) of your neck to fuse bones in your neck together. This procedure is done to relieve the pressure on pinched nerve roots or spinal cord. This operation is done to control the movement of your spine, which may be pressing on the nerves. This may relieve the pain. The procedure that stops the movement of the spine is called a fusion. The cut by the surgeon (incision) is usually within a skin fold line under your chin. After moving the neck muscles gently apart, the neurosurgeon uses an operating microscope and removes the injured intervertebral disk (the cushion or pad of tissue between the bones of the spine). This takes the pressure off the nerves or spinal cord. This is called decompression. The area where the disc was removed is then filled with a bone graft. The graft will fuse the vertebrae together over time. This means it causes the vertebral bodies to grow together. The bone graft may be obtained from your own bone (your hip for example), or may be obtained from a bone bank. Receiving bone from a bone bank is similar to a blood bank, only the bone comes from human donors who have recently died. This type of graft is referred to as allograft bone. The preformed bone plug is safe and will not be rejected by your body. It does not contain blood cells. In some cases, the surgeon may use hardware in your neck to help stabilize it. This means that metal plates or pins or screws may be used to:  Provide extra support to the neck.   Help the bones to grow together more easily.  A cervical fusion procedure takes a couple hours to several hours, depending on  what needs to be done. Your caregiver will be able to answer your questions for you. HOME CARE INSTRUCTIONS   It will be normal to have a sore throat and have difficulty swallowing foods for a couple weeks following surgery. See your caregiver if this seems to be getting worse rather than better.   You may resume normal diet and activities as directed or allowed. Generally, walking and stair climbing are fine. Avoid lifting more than ten pounds and do no lifting above your head.   If given a cervical collar, remove only for bathing and eating, or as directed.   Use only showers for cleaning up, with no bathing, until seen.   You may apply ice to the surgical or bone donor site for 15 to 20 minutes each hour while awake for the first couple days following surgery. Put the ice in a plastic bag and place a towel between the bag of ice and your skin.   Change dressings if necessary or as directed.   You may drive in 10 days   Take prescribed medication as directed. Only take over-the-counter or prescription medicines for pain, discomfort, or fever as directed by your caregiver.   Make an appointment to see your caregiver for suture or staple removal when instructed.   If physical therapy was prescribed, follow your caregiver's directions.  SEEK IMMEDIATE MEDICAL CARE IF:  There is redness, swelling, or increasing pain in the wound.   There is  pus coming from the wound.   An unexplained oral temperature over 102 F (38.9 C) develops.   There is a bad smell coming from the wound or dressing.   You have swelling in your calf or leg.   You develop shortness of breath or chest pain.   The wound edges break open after sutures or staples have been removed.   Your pain is not controlled with medicine.   You seem to be getting worse rather than better.  Document Released: 12/13/2003 Document Revised: 01/10/2011 Document Reviewed: 02/18/2008 [facility] Patient Information 2012 [place].

## 2023-09-21 NOTE — Evaluation (Signed)
 Physical Therapy Evaluation & Discharge Patient Details Name: Katie Robbins MRN: [id number] DOB: 03/03/1956 Today's Date: 09/21/2023  History of Present Illness  68 y.o. female s/p elective ACDF C4-5, hardware removal C3-7 on 09/20/23. PMH includes HTN, CKD, ADHD, CVA/TIA, head injury, concussion, memory deficits, prior cervical fusion.  Clinical Impression  Patient evaluated by Physical Therapy with no further acute PT needs identified. PTA, pt ambulates with intermittent use of walker, lives with husband who supervises majority of her mobility/self-care, he is always with her for community ambulation and stairs secondary to h/o head injury and falls. Today, pt moving well, able to transfer and ambulate with RW, perform stair training with CGA. All education has been completed and the patient has no further questions. Acute PT is signing off. Thank you for this referral.      If plan is discharge home, recommend the following: A little help with walking and/or transfers;A little help with bathing/dressing/bathroom;Assistance with cooking/housework;Assist for transportation;Help with stairs or ramp for entrance   Can travel by private vehicle    Yes    Equipment Recommendations None recommended by PT  Recommendations for Other Services   Mobility Specialist   Functional Status Assessment       Precautions / Restrictions Precautions Precautions: Fall;Cervical Precaution Booklet Issued: Yes (comment) (by OT) Recall of Precautions/Restrictions: Impaired Required Braces or Orthoses: Cervical Brace Cervical Brace: Soft collar;For comfort Restrictions Weight Bearing Restrictions Per Provider Order: No      Mobility  Bed Mobility Overal bed mobility: Modified Independent Bed Mobility: Rolling, Sit to Sidelying                Transfers Overall transfer level: Needs assistance Equipment used: None, Rolling walker (2 wheels) Transfers: Sit to/from Stand Sit to Stand:  Supervision                Ambulation/Gait Ambulation/Gait assistance: Contact guard assist Gait Distance (Feet): 100 Feet Assistive device: Rolling walker (2 wheels) Gait Pattern/deviations: Step-through pattern, Decreased stride length Gait velocity: Decreased     General Gait Details: pt reports plan to use walker at home. slow, fairly steady gait with RW and CGA for balance/lines; no overt instability or LOB noted. pt requiring intermittent cues to increase gait speed, though repeatedly slows down when talking  Stairs Stairs: Yes Stairs assistance: Contact guard assist Stair Management: One rail Right, Forwards Number of Stairs: 10 General stair comments: ascend/descend 2-3 steps x4 with rail support and CGA for balance/lines; no overt instability or LOB, intermittent cues to focus on task  Wheelchair Mobility     Tilt Bed    Modified Rankin (Stroke Patients Only)       Balance Overall balance assessment: Needs assistance Sitting-balance support: No upper extremity supported, Feet supported Sitting balance-Leahy Scale: Good     Standing balance support: No upper extremity supported, During functional activity Standing balance-Leahy Scale: Fair Standing balance comment: can stand and take steps without UE support                             Pertinent Vitals/Pain Pain Assessment Pain Assessment: No/denies pain Pain Intervention(s): Monitored during session    Home Living Family/patient expects to be discharged to:: Private residence Living Arrangements: Spouse/significant other Available Help at Discharge: Family;Available 24 hours/day Type of Home: House Home Access: Level entry     Alternate Level Stairs-Number of Steps: 16 Home Layout: Two level;1/2 bath on main level Home Equipment: Rolling  Walker (2 wheels);Cane - single point;Rollator (4 wheels) Additional Comments: lives with husband who works from home    Prior Function Prior  Level of Function : Needs assist             Mobility Comments: household ambulation without DME, supervision from husband; husband typically "holds onto" patient when walking outside, always with pt when walking up/down stairs. retired Arts development officer ADLs Comments: husband assists getting into/out of shower, assists with ADL/iADLs as needed     Extremity/Trunk Assessment   Upper Extremity Assessment Upper Extremity Assessment: Overall WFL for tasks assessed [facility] for functional tasks, not formally assessed secondary to cervical sx)    Lower Extremity Assessment Lower Extremity Assessment: Overall WFL for tasks assessed (gross strength >/ 4/5)    Cervical / Trunk Assessment Cervical / Trunk Assessment: Neck Surgery  Communication   Communication Communication: No apparent difficulties    Cognition Arousal: Alert Behavior During Therapy: WFL for tasks assessed/performed   PT - Cognitive impairments: History of cognitive impairments                       PT - Cognition Comments: h/o cognitive impairments baseline, including memory problems; pt aware of this and frequently references it. very poor attention and tangential in conversation requiring frequent redirection to current task/topic Following commands: Intact       Cueing Cueing Techniques: Verbal cues     General Comments General comments (skin integrity, edema, etc.): educ re: role of acute PT, POC, cervical precautions, activity recommendations, fall risk reduction, d/c needs including DME use and assist from husband    Exercises     Assessment/Plan    PT Assessment    PT Problem List         PT Treatment Interventions      PT Goals (Current goals can be found in the Care Plan section)  Acute Rehab PT Goals PT Goal Formulation: All assessment and education complete, DC therapy    Frequency       Co-evaluation               AM-PAC PT "6 Clicks" Mobility  Outcome Measure Help needed  turning from your back to your side while in a flat bed without using bedrails?: None Help needed moving from lying on your back to sitting on the side of a flat bed without using bedrails?: None Help needed moving to and from a bed to a chair (including a wheelchair)?: A Little Help needed standing up from a chair using your arms (e.g., wheelchair or bedside chair)?: A Little Help needed to walk in hospital room?: A Little Help needed climbing 3-5 steps with a railing? : A Little 6 Click Score: 20    End of Session Equipment Utilized During Treatment: Cervical collar;Gait belt Activity Tolerance: Patient tolerated treatment well Patient left: in bed;with call bell/phone within reach;with bed alarm set Nurse Communication: Mobility status PT Visit Diagnosis: Other abnormalities of gait and mobility (R26.89)    Time: 1053-1110 PT Time Calculation (min) (ACUTE ONLY): 17 min   Charges:   PT Evaluation $PT Eval Low Complexity: 1 Low   PT General Charges $$ ACUTE PT VISIT: 1 Visit       Blase Bur, PT, DPT Acute Rehabilitation Services  Personal: Secure Chat Rehab Office: [phone]  Albino Hum 09/21/2023, 11:52 AM

## 2023-09-21 NOTE — Progress Notes (Signed)
 Subjective: The patient is alert and pleasant.  She is not sure if she is ready to go home.  She feels a bit unsteady and OT has recommended a PT consult.  She requires some redirecting.  Objective: Vital signs in last 24 hours: Temp:  [96.9 F (36.1 C)-98.9 F (37.2 C)] 98.2 F (36.8 C) (05/10 0901) Pulse Rate:  [59-83] 79 (05/10 0901) Resp:  [10-18] 17 (05/10 0901) BP: (123-156)/(63-98) 132/77 (05/10 0901) SpO2:  [93 %-100 %] 93 % (05/10 0901) Weight:  [60.8 kg] 60.8 kg (05/09 0920) Estimated body mass index is 23.74 kg/m as calculated from the following:   Height as of this encounter: 5\' 3"  (1.6 m).   Weight as of this encounter: 60.8 kg.   Intake/Output from previous day: 05/09 0701 - 05/10 0700 In: 220 [P.O.:120; IV Piggyback:100] Out: 15 [Blood:15] Intake/Output this shift: No intake/output data recorded.  Physical exam the patient is alert and oriented.  Her dressing is clean and dry.  Her strength is normal.  Lab Results: No results for input(s): "WBC", "HGB", "HCT", "PLT" in the last 72 hours. BMET No results for input(s): "NA", "K", "CL", "CO2", "GLUCOSE", "BUN", "CREATININE", "CALCIUM" in the last 72 hours.  Studies/Results: DG Cervical Spine 2 or 3 views Result Date: 09/20/2023 CLINICAL DATA:  Elective surgery. EXAM: CERVICAL SPINE - 2-3 VIEW COMPARISON:  Preoperative imaging FINDINGS: Two fluoroscopic spot views of the cervical spine submitted from the operating room. Previous anterior fusion at C3-C4 and C5 through C7. Surgical instrument localizes anteriorly at the C5 level. Subsequent removal of previous hardware. There is new anterior fusion at C4-C5 fluid new interbody spacer at this level. The previous interbody spacers remain in place. Fluoroscopy time 8.4 seconds. Dose 1 mGy. IMPRESSION: Intraoperative fluoroscopy during cervical spine surgery. Electronically Signed   By: Chadwick Colonel M.D.   On: 09/20/2023 17:15   DG C-Arm 1-60 Min-No Report Result Date:  09/20/2023 Fluoroscopy was utilized by the requesting physician.  No radiographic interpretation.   DG C-Arm 1-60 Min-No Report Result Date: 09/20/2023 Fluoroscopy was utilized by the requesting physician.  No radiographic interpretation.    Assessment/Plan: Postop day 1: As recommended I will ask PT to see the patient.  She may be able to go home later on today.  I gave her her discharge instructions and answered all her questions.  LOS: 0 days     Katie Robbins 09/21/2023, 9:07 AM     Patient ID: Katie Robbins, female   DOB: 1956/05/13, 68 y.o.   MRN: [id number]

## 2023-09-21 NOTE — Plan of Care (Signed)
  Problem: Health Behavior/Discharge Planning: Goal: Ability to manage health-related needs will improve Outcome: Progressing   Problem: Clinical Measurements: Goal: Ability to maintain clinical measurements within normal limits will improve Outcome: Progressing Goal: Will remain free from infection Outcome: Progressing Goal: Diagnostic test results will improve Outcome: Progressing Goal: Respiratory complications will improve Outcome: Progressing Goal: Cardiovascular complication will be avoided Outcome: Progressing   Problem: Activity: Goal: Risk for activity intolerance will decrease Outcome: Progressing   Problem: Nutrition: Goal: Adequate nutrition will be maintained Outcome: Progressing   Problem: Coping: Goal: Level of anxiety will decrease Outcome: Progressing   Problem: Elimination: Goal: Will not experience complications related to bowel motility Outcome: Progressing Goal: Will not experience complications related to urinary retention Outcome: Progressing   Problem: Pain Managment: Goal: General experience of comfort will improve and/or be controlled Outcome: Progressing   Problem: Safety: Goal: Ability to remain free from injury will improve Outcome: Progressing   Problem: Skin Integrity: Goal: Risk for impaired skin integrity will decrease Outcome: Progressing   Problem: Education: Goal: Ability to verbalize activity precautions or restrictions will improve Outcome: Progressing Goal: Knowledge of the prescribed therapeutic regimen will improve Outcome: Progressing Goal: Understanding of discharge needs will improve Outcome: Progressing   Problem: Activity: Goal: Ability to avoid complications of mobility impairment will improve Outcome: Progressing Goal: Ability to tolerate increased activity will improve Outcome: Progressing Goal: Will remain free from falls Outcome: Progressing   Problem: Bowel/Gastric: Goal: Gastrointestinal status for  postoperative course will improve Outcome: Progressing   Problem: Clinical Measurements: Goal: Ability to maintain clinical measurements within normal limits will improve Outcome: Progressing Goal: Postoperative complications will be avoided or minimized Outcome: Progressing Goal: Diagnostic test results will improve Outcome: Progressing   Problem: Pain Management: Goal: Pain level will decrease Outcome: Progressing   Problem: Skin Integrity: Goal: Will show signs of wound healing Outcome: Progressing   Problem: Health Behavior/Discharge Planning: Goal: Identification of resources available to assist in meeting health care needs will improve Outcome: Progressing   Problem: Bladder/Genitourinary: Goal: Urinary functional status for postoperative course will improve Outcome: Progressing

## 2023-09-21 NOTE — Discharge Summary (Signed)
 Physician Discharge Summary  Patient ID: Katie Robbins MRN: [id number] DOB/AGE: 1956-05-10 68 y.o.  Admit date: 09/20/2023 Discharge date: 09/21/2023  Admission Diagnoses:cervical stenosis  Discharge Diagnoses:  Principal Problem:   Status post cervical spinal fusion Active Problems:   Spinal stenosis of cervical region   Discharged Condition: good  Hospital Course: Katie Robbins was admitted and taken to the operating room for an uncomplicated ACDF at C4/5, and removal of plates at W0/9, and 5-7 along with hardware. Post op she is tolerating a regular diet, moving all extremities, and her wound is clean, dry, without signs of infection.   Treatments: surgery: Anterior cervical discectomy and fusion C4-5 utilizing allograft and the globus resonate plating system.   2.  Exploration fusion removal of hardware with removal of plate and screws at C3-4 and removal of plate and screws at C5-7.  Discharge Exam: Blood pressure (!) 152/71, pulse 83, temperature 98 F (36.7 C), temperature source Oral, resp. rate 16, height 5\' 3"  (1.6 m), weight 60.8 kg, SpO2 100%. General appearance: alert, cooperative, appears stated age, and no distress  Disposition: Discharge disposition: 01-Home or Self Care      SPONDYLOSIS OF CERVICAL REGION WITHOUT MYELOPATHY OR RADICULOPATHY  Allergies as of 09/21/2023       Reactions   Bactrim [sulfamethoxazole-trimethoprim] Hives   Macrobid [nitrofurantoin Macrocrystal] Hives   Tizanidine Other (See Comments)   Bad Mood change Migraines   Vicodin [hydrocodone-acetaminophen ] Swelling   Puffy eyes and face   Trileptal  [oxcarbazepine ] Rash   Abilify [aripiprazole] Other (See Comments)   Bad leg cramps   Acetaminophen     Other reaction(s): Puffy face/eyes Patient states that she is able to take acetaminophen    Amaryl [glimepiride] Other (See Comments)   Not effective   Ambien  [zolpidem ] Other (See Comments)   Did not work   Cyclobenzaprine Other  (See Comments)   Not effective   Erythromycin Base    Other Reaction(s): Rash   Gabapentin Other (See Comments)   XR makes her Groggy   Hydrocodone Swelling   Eyes and face   Klonopin [clonazepam] Other (See Comments)   Make her feel groggy   Nexium [esomeprazole Magnesium] Nausea And Vomiting   Face swelling, hives    Nortriptyline    insomnia   Olanzapine Other (See Comments)   Not effective   Pregabalin Other (See Comments)   Not effective   Prilosec [omeprazole] Nausea And Vomiting   Face swelling, hives    Quetiapine Other (See Comments)   Groggy   Reglan  [metoclopramide ] Nausea Only   Savella [milnacipran] Other (See Comments)   Not effective   Sulfa Antibiotics Hives   Tamoxifen    Tapentadol Other (See Comments)   Not effective   Topiramate Other (See Comments)   Not effective   Trimethoprim    Other reaction(s): Hives   Wasp Venom Hives   swelling   Zoloft  [sertraline ] Other (See Comments)   Not effective   Erythromycin Rash   Lamotrigine  Rash   Bad mood swings   Latex Rash   Molds & Smuts Anxiety   Black Mold   Tramadol Nausea Only   Not effective        Medication List     TAKE these medications    albuterol  108 (90 Base) MCG/ACT inhaler Commonly known as: VENTOLIN  HFA Inhale 2 puffs into the lungs every 6 (six) hours as needed for wheezing or shortness of breath.   amLODipine  5 MG tablet Commonly known as: NORVASC  TAKE  ONE TABLET BY MOUTH ONCE DAILY FOR BLOOD PRESSURE   amphetamine -dextroamphetamine  20 MG tablet Commonly known as: ADDERALL Take 20 mg by mouth See admin instructions. Take 1 tablet (20 mg) by mouth scheduled in the morning & in the afternoon, may take an additional dose in the evening, if needed for focus/attention.   aspirin  EC 81 MG tablet Take 1 tablet (81 mg total) by mouth in the morning.   budesonide -formoterol  160-4.5 MCG/ACT inhaler Commonly known as: Symbicort  Inhale 2 puffs into the lungs 2 (two) times  daily. What changed:  when to take this reasons to take this   carboxymethylcellulose 0.5 % Soln Commonly known as: REFRESH PLUS Place 1-2 drops into both eyes 3 (three) times daily as needed (dry/irritated eyes.).   cyanocobalamin  1000 MCG/ML injection Commonly known as: VITAMIN B12 INJECT ONE VIAL (1 ML) INTO THE MUSCLE EVERY THREE TO FOUR WEEKS   diazepam  5 MG tablet Commonly known as: VALIUM  Take 5 mg by mouth 4 (four) times daily as needed for anxiety.   EPINEPHrine  0.3 mg/0.3 mL Soaj injection Commonly known as: EPI-PEN Inject 0.3 mLs (0.3 mg total) into the muscle as needed for anaphylaxis.   estradiol  0.1 MG/GM vaginal cream Commonly known as: ESTRACE  Place 0.25 Applicatorfuls vaginally at bedtime.   estradiol  1 MG tablet Commonly known as: ESTRACE  Take 1 tablet (1 mg total) by mouth daily.   gi cocktail Susp suspension Take 20 mLs by mouth 2 (two) times daily. Maalox 30 mL, lidocaine  5 mL and dicyclomine 10 mL, dispense 450 mL,  SIG take 15 to 20 mL as needed 3-4 times daily. What changed:  how much to take when to take this reasons to take this   levothyroxine  112 MCG tablet Commonly known as: SYNTHROID  TAKE ONE TABLET (112 MCG TOTAL) BY MOUTH AS DIRECTED ON SUNDAYS   levothyroxine  100 MCG tablet Commonly known as: SYNTHROID  TAKE ONE TABLET BY MOUTH MONDAY THROUGH SATURDAYS   olmesartan  20 MG tablet Commonly known as: BENICAR  TAKE ONE TABLET (20 MG TOTAL) BY MOUTH DAILY.   Oxycodone  HCl 10 MG Tabs Take 1 tablet (10 mg total) by mouth every 3 (three) hours as needed for severe pain (pain score 7-10).   pantoprazole  40 MG tablet Commonly known as: PROTONIX  TAKE ONE TABLET (40 MG TOTAL) BY MOUTH DAILY. FOR HEARTBURN. What changed: See the new instructions.         SignedAudie Bleacher 09/21/2023, 2:58 PM

## 2023-09-21 NOTE — Anesthesia Postprocedure Evaluation (Signed)
 Anesthesia Post Note  Patient: Katie Robbins  Procedure(s) Performed: ANTERIOR CERVICAL DECOMPRESSION/DISCECTOMY FUSION CERVICAL FOUR-FIVE     Patient location during evaluation: PACU Anesthesia Type: General Level of consciousness: awake and alert Pain management: pain level controlled Vital Signs Assessment: post-procedure vital signs reviewed and stable Respiratory status: spontaneous breathing, nonlabored ventilation, respiratory function stable and patient connected to nasal cannula oxygen Cardiovascular status: blood pressure returned to baseline and stable Postop Assessment: no apparent nausea or vomiting Anesthetic complications: no   No notable events documented.  Last Vitals:  Vitals:   09/20/23 2339 09/21/23 0417  BP: 130/63 123/69  Pulse: 73 (!) 59  Resp: 16 16  Temp: 36.7 C 36.4 C  SpO2: 99% 97%    Last Pain:  Vitals:   09/21/23 0238  TempSrc:   PainSc: 6    Pain Goal: Patients Stated Pain Goal: 5 (09/20/23 1715)                 Shiheem Corporan

## 2023-09-21 NOTE — Evaluation (Signed)
 Occupational Therapy Evaluation Patient Details Name: Katie Robbins MRN: [id number] DOB: 1956/03/04 Today's Date: 09/21/2023   History of Present Illness   Pt is a 68 yr old female admitted for Anterior cervical discectomy and fusion C4-5 and exploration fusion removal of hardware with removal of plate and screws at C3-4 and removal of plate and screws at C5-7 via Dr. Lamon Pillow.  PMH hypertnension, concussion, memory deficits. Prior cervical fusion.     Clinical Impressions Pt currently min guard assist for simulated selfcare sit to stand and functional transfers without use of an assistive device.  Pain 5/10 in her neck but tolerable and did not limit activity.  Pt with history of cognitive deficits and some balance deficits requiring assist from her spouse at times for mobility out in the community with use of cane or his support.  He would also assist with going up and down steps and getting in and out of her shower.  Feel she will benefit from acute care to progress to supervision/modified independent level for home.  Her husband can provide 24 hr supervision as needed per her report.  No DME needs at this time but recommend PT consult for balance and follow-up recommendations.      If plan is discharge home, recommend the following:   A little help with walking and/or transfers;A little help with bathing/dressing/bathroom;Assistance with cooking/housework;Assist for transportation;Help with stairs or ramp for entrance;Supervision due to cognitive status     Functional Status Assessment   Patient has had a recent decline in their functional status and demonstrates the ability to make significant improvements in function in a reasonable and predictable amount of time.     Equipment Recommendations   None recommended by OT     Recommendations for Other Services   PT consult     Precautions/Restrictions   Precautions Precautions: Fall;Cervical Precaution Booklet Issued: Yes  (comment) Recall of Precautions/Restrictions: Impaired Required Braces or Orthoses: Cervical Brace Cervical Brace: Soft collar;For comfort     Mobility Bed Mobility Overal bed mobility: Needs Assistance Bed Mobility: Rolling, Sidelying to Sit Rolling: Supervision Sidelying to sit: Supervision       General bed mobility comments: Min instructional cueing    Transfers Overall transfer level: Needs assistance Equipment used: None Transfers: Sit to/from Stand, Bed to chair/wheelchair/BSC Sit to Stand: Contact guard assist     Step pivot transfers: Contact guard assist            Balance Overall balance assessment: Needs assistance Sitting-balance support: Bilateral upper extremity supported Sitting balance-Leahy Scale: Good     Standing balance support: No upper extremity supported, During functional activity Standing balance-Leahy Scale: Fair Standing balance comment: Higher level balance deficits present but able to stand statically and wash hands without LOB                           ADL either performed or assessed with clinical judgement   ADL Overall ADL's : Needs assistance/impaired Eating/Feeding: Independent;Sitting   Grooming: Wash/dry hands;Wash/dry face;Supervision/safety;Standing   Upper Body Bathing: Set up;Sitting   Lower Body Bathing: Sit to/from stand;Contact guard assist   Upper Body Dressing : Set up;Sitting   Lower Body Dressing: Contact guard assist;Sit to/from stand   Toilet Transfer: Contact guard assist;Comfort height toilet;Grab bars Toilet Transfer Details (indicate cue type and reason): no assistive device Toileting- Clothing Manipulation and Hygiene: Contact guard assist;Sit to/from stand       Functional mobility during ADLs:  Contact guard assist (ambulation, no assistive device) General ADL Comments: Cervical precautions handout given and reviewed.  Discussed need for shower seat for safety at home and she states she  thinks she has one already.  Toilets are comfort height and she reports that she will not need an elevated toilet.  Spouse can provide 24 hour and frequently helps with steps, shower transfers, and walking in the community.  Recommend PT consult secondary to history of balance deficits and having 16 steps up to the bedroom.     Vision Baseline Vision/History: 1 Wears glasses Ability to See in Adequate Light: 0 Adequate Patient Visual Report: No change from baseline Vision Assessment?: No apparent visual deficits     Perception Perception: Not tested       Praxis Praxis: Not tested       Pertinent Vitals/Pain Pain Assessment Pain Assessment: 0-10 Pain Score: 5  Pain Location: neck Pain Descriptors / Indicators: Discomfort Pain Intervention(s): Limited activity within patient's tolerance, Repositioned     Extremity/Trunk Assessment Upper Extremity Assessment Upper Extremity Assessment: Overall WFL for tasks assessed (Not formally assessed secondary to precautions but grip WFLs with slight swelling in various digits secondary to arthritis.)   Lower Extremity Assessment Lower Extremity Assessment: Defer to PT evaluation   Cervical / Trunk Assessment Cervical / Trunk Assessment: Neck Surgery (soft cervical collar in place)   Communication     Cognition Arousal: Alert Behavior During Therapy: WFL for tasks assessed/performed Cognition: History of cognitive impairments             OT - Cognition Comments: Pt with history of cognitive impairment per her report.  Difficulty staying on topic and answering questions asked.                 Following commands: Intact       Cueing  General Comments   Cueing Techniques: Verbal cues              Home Living Family/patient expects to be discharged to:: Private residence Living Arrangements: Spouse/significant other Available Help at Discharge: Available 24 hours/day Type of Home: House Home Access: Level  entry     Home Layout: Two level;1/2 bath on main level Alternate Level Stairs-Number of Steps: 16 Alternate Level Stairs-Rails: Right;Left (half wall on the left going up) Bathroom Shower/Tub: Walk-in shower;Door   [employer] Toilet: Standard     Home Equipment: Agricultural consultant (2 wheels);Cane - single point;Rollator (4 wheels)   Additional Comments: History of cognitive disorder per her reportj, memory deficits      Prior Functioning/Environment Prior Level of Function : Needs assist             Mobility Comments: Walked without assistive device most of the time, would use spouse for support if outside of the home ADLs Comments: Spouse would help get in and out of the shower.    OT Problem List: Decreased strength;Impaired balance (sitting and/or standing);Pain   OT Treatment/Interventions: Self-care/ADL training;Patient/family education;Balance training;Therapeutic activities;DME and/or AE instruction      OT Goals(Current goals can be found in the care plan section)   Acute Rehab OT Goals Patient Stated Goal: Go home soon OT Goal Formulation: With patient Time For Goal Achievement: 10/05/23 Potential to Achieve Goals: Good   OT Frequency:  Min 2X/week       AM-PAC OT "6 Clicks" Daily Activity     Outcome Measure Help from another person eating meals?: None Help from another person taking care of personal grooming?:  A Little Help from another person toileting, which includes using toliet, bedpan, or urinal?: A Little Help from another person bathing (including washing, rinsing, drying)?: A Little Help from another person to put on and taking off regular upper body clothing?: A Little Help from another person to put on and taking off regular lower body clothing?: A Little 6 Click Score: 19   End of Session Equipment Utilized During Treatment: Gait belt Nurse Communication: Mobility status  Activity Tolerance: Patient tolerated treatment well Patient left: in  chair;with chair alarm set  OT Visit Diagnosis: Unsteadiness on feet (R26.81);Other symptoms and signs involving cognitive function;Muscle weakness (generalized) (M62.81);Pain Pain - part of body:  (neck)                Time: 5409-8119 OT Time Calculation (min): 43 min Charges:  OT General Charges $OT Visit: 1 Visit OT Evaluation $OT Eval Moderate Complexity: 1 Mod OT Treatments $Self Care/Home Management : 23-37 mins  Ardena Becker, OTR/L Acute Rehabilitation Services  Office [phone] 09/21/2023

## 2023-09-23 ENCOUNTER — Encounter (HOSPITAL_COMMUNITY): Payer: Self-pay | Admitting: Neurosurgery

## 2023-09-23 MED FILL — Thrombin For Soln 5000 Unit: CUTANEOUS | Qty: 2 | Status: AC

## 2023-09-23 MED FILL — Thrombin For Soln 5000 Unit: CUTANEOUS | Qty: 5000 | Status: AC

## 2023-10-08 DIAGNOSIS — M542 Cervicalgia: Secondary | ICD-10-CM | POA: Diagnosis not present

## 2023-10-11 ENCOUNTER — Ambulatory Visit
Admission: RE | Admit: 2023-10-11 | Discharge: 2023-10-11 | Disposition: A | Discharge status: Home / Self Care | Source: Ambulatory Visit | Attending: Primary Care | Admitting: Primary Care

## 2023-10-11 DIAGNOSIS — E2839 Other primary ovarian failure: Secondary | ICD-10-CM | POA: Insufficient documentation

## 2023-10-11 DIAGNOSIS — Z780 Asymptomatic menopausal state: Secondary | ICD-10-CM | POA: Diagnosis not present

## 2023-10-15 ENCOUNTER — Ambulatory Visit: Payer: Self-pay | Admitting: Primary Care

## 2023-10-23 ENCOUNTER — Other Ambulatory Visit: Payer: Self-pay | Admitting: Primary Care

## 2023-10-23 DIAGNOSIS — I10 Essential (primary) hypertension: Secondary | ICD-10-CM

## 2023-10-24 ENCOUNTER — Other Ambulatory Visit: Payer: Self-pay | Admitting: Primary Care

## 2023-10-24 ENCOUNTER — Other Ambulatory Visit: Payer: Self-pay | Admitting: Emergency Medicine

## 2023-10-24 DIAGNOSIS — T63461D Toxic effect of venom of wasps, accidental (unintentional), subsequent encounter: Secondary | ICD-10-CM

## 2023-10-24 MED ORDER — EPINEPHRINE 0.3 MG/0.3ML IJ SOAJ: 0.3000 mg | INTRAMUSCULAR | 0 refills | Status: AC | PRN

## 2023-10-24 NOTE — Telephone Encounter (Unsigned)
 Copied from CRM [id number]. Topic: Clinical - Medication Refill >> Oct 24, 2023  1:22 PM Howard Macho wrote: Medication: epi pen (the pharmacy told the patient the epi pen has expired and she need a new script)  Has the patient contacted their pharmacy? Yes (Agent: If no, request that the patient contact the pharmacy for the refill. If patient does not wish to contact the pharmacy document the reason why and proceed with request.) (Agent: If yes, when and what did the pharmacy advise?)  This is the patient's preferred pharmacy:  [facility] - [place], [place] - [address] [place] [address] Phone: [phone] Fax: [phone]  Is this the correct pharmacy for this prescription? Yes If no, delete pharmacy and type the correct one.   Has the prescription been filled recently? No  Is the patient out of the medication? Yes  Has the patient been seen for an appointment in the last year OR does the patient have an upcoming appointment? Yes  Can we respond through MyChart? Yes  Agent: Please be advised that Rx refills may take up to 3 business days. We ask that you follow-up with your pharmacy.

## 2023-11-11 ENCOUNTER — Other Ambulatory Visit: Payer: Self-pay | Admitting: Internal Medicine

## 2023-11-13 ENCOUNTER — Ambulatory Visit (INDEPENDENT_AMBULATORY_CARE_PROVIDER_SITE_OTHER): Admitting: Internal Medicine

## 2023-11-13 ENCOUNTER — Encounter: Payer: Self-pay | Admitting: Internal Medicine

## 2023-11-13 ENCOUNTER — Telehealth: Admitting: Physician Assistant

## 2023-11-13 VITALS — BP 138/72 | HR 88 | Temp 98.6°F | Ht 63.0 in | Wt 132.0 lb

## 2023-11-13 DIAGNOSIS — G4459 Other complicated headache syndrome: Secondary | ICD-10-CM

## 2023-11-13 DIAGNOSIS — R6889 Other general symptoms and signs: Secondary | ICD-10-CM

## 2023-11-13 DIAGNOSIS — R519 Headache, unspecified: Secondary | ICD-10-CM | POA: Insufficient documentation

## 2023-11-13 DIAGNOSIS — G8929 Other chronic pain: Secondary | ICD-10-CM

## 2023-11-13 LAB — TSH: TSH: 0.66 u[IU]/mL (ref 0.35–5.50)

## 2023-11-13 LAB — SEDIMENTATION RATE: Sed Rate: 4 mm/h (ref 0–30)

## 2023-11-13 LAB — COMPREHENSIVE METABOLIC PANEL WITH GFR
ALT: 5 U/L (ref 0–35)
AST: 10 U/L (ref 0–37)
Albumin: 4.4 g/dL (ref 3.5–5.2)
Alkaline Phosphatase: 66 U/L (ref 39–117)
BUN: 22 mg/dL (ref 6–23)
CO2: 27 meq/L (ref 19–32)
Calcium: 9.5 mg/dL (ref 8.4–10.5)
Chloride: 103 meq/L (ref 96–112)
Creatinine, Ser: 1.27 mg/dL — ABNORMAL HIGH (ref 0.40–1.20)
GFR: 43.57 mL/min — ABNORMAL LOW (ref >60.00)
Glucose, Bld: 99 mg/dL (ref 70–99)
Potassium: 4.8 meq/L (ref 3.5–5.1)
Sodium: 139 meq/L (ref 135–145)
Total Bilirubin: 0.3 mg/dL (ref 0.2–1.2)
Total Protein: 7.2 g/dL (ref 6.0–8.3)

## 2023-11-13 LAB — CBC
HCT: 34.5 % — ABNORMAL LOW (ref 36.0–46.0)
Hemoglobin: 11.4 g/dL — ABNORMAL LOW (ref 12.0–15.0)
MCHC: 33 g/dL (ref 30.0–36.0)
MCV: 90.8 fl (ref 78.0–100.0)
Platelets: 288 10*3/uL (ref 150.0–400.0)
RBC: 3.79 Mil/uL — ABNORMAL LOW (ref 3.87–5.11)
RDW: 13.8 % (ref 11.5–15.5)
WBC: 6.4 10*3/uL (ref 4.0–10.5)

## 2023-11-13 NOTE — Patient Instructions (Signed)
 Eveleen ONEIDA Jester, thank you for joining Elsie Velma Lunger, PA-C for today's virtual visit.  While this provider is not your primary care provider (PCP), if your PCP is located in our provider database this encounter information will be shared with them immediately following your visit.   A [facility] MyChart account gives you access to today's visit and all your visits, tests, and labs performed at [facility]  click here if you don't have a [facility] MyChart account or go to [email or web address]  Consent: (Patient) Katie Robbins provided verbal consent for this virtual visit at the beginning of the encounter.  Current Medications:  Current Outpatient Medications:    albuterol  (VENTOLIN  HFA) 108 (90 Base) MCG/ACT inhaler, Inhale 2 puffs into the lungs every 6 (six) hours as needed for wheezing or shortness of breath., Disp: 8.5 g, Rfl: 0   Alum & Mag Hydroxide-Simeth (GI COCKTAIL) SUSP suspension, Take 20 mLs by mouth 2 (two) times daily. Maalox 30 mL, lidocaine  5 mL and dicyclomine 10 mL, dispense 450 mL,  SIG take 15 to 20 mL as needed 3-4 times daily. (Patient taking differently: Take 15-20 mLs by mouth 2 (two) times daily as needed (esophageal spasm). Maalox 30 mL, lidocaine  5 mL and dicyclomine 10 mL, dispense 450 mL,  SIG take 15 to 20 mL as needed 3-4 times daily.), Disp: 1200 mL, Rfl: 1   amLODipine  (NORVASC ) 5 MG tablet, TAKE ONE TABLET BY MOUTH ONCE DAILY FOR BLOOD PRESSURE, Disp: 90 tablet, Rfl: 0   amphetamine -dextroamphetamine  (ADDERALL) 20 MG tablet, Take 20 mg by mouth See admin instructions. Take 1 tablet (20 mg) by mouth scheduled in the morning & in the afternoon, may take an additional dose in the evening, if needed for focus/attention., Disp: , Rfl:    aspirin  EC 81 MG tablet, Take 1 tablet (81 mg total) by mouth in the morning., Disp: 30 tablet, Rfl: 12   budesonide -formoterol  (SYMBICORT ) 160-4.5 MCG/ACT inhaler, INHALE TWO PUFFS INTO THE LUNGS TWO TIMES  DAILY, Disp: 10.2 g, Rfl: 0   carboxymethylcellulose (REFRESH PLUS) 0.5 % SOLN, Place 1-2 drops into both eyes 3 (three) times daily as needed (dry/irritated eyes.)., Disp: , Rfl:    cyanocobalamin  (VITAMIN B12) 1000 MCG/ML injection, INJECT ONE VIAL (1 ML) INTO THE MUSCLE EVERY THREE TO FOUR WEEKS, Disp: 12 mL, Rfl: 0   diazepam  (VALIUM ) 5 MG tablet, Take 5 mg by mouth 4 (four) times daily as needed for anxiety., Disp: , Rfl:    EPINEPHrine  0.3 mg/0.3 mL IJ SOAJ injection, Inject 0.3 mg into the muscle as needed for anaphylaxis., Disp: 1 each, Rfl: 0   estradiol  (ESTRACE ) 0.1 MG/GM vaginal cream, Place 0.25 Applicatorfuls vaginally at bedtime., Disp: 90 g, Rfl: 3   estradiol  (ESTRACE ) 1 MG tablet, Take 1 tablet (1 mg total) by mouth daily., Disp: 30 tablet, Rfl: 11   levothyroxine  (SYNTHROID ) 100 MCG tablet, TAKE ONE TABLET BY MOUTH MONDAY THROUGH SATURDAYS, Disp: 72 tablet, Rfl: 3   levothyroxine  (SYNTHROID ) 112 MCG tablet, TAKE ONE TABLET (112 MCG TOTAL) BY MOUTH AS DIRECTED ON SUNDAYS, Disp: 12 tablet, Rfl: 3   olmesartan  (BENICAR ) 20 MG tablet, TAKE ONE TABLET (20 MG TOTAL) BY MOUTH DAILY., Disp: 90 tablet, Rfl: 0   oxyCODONE  10 MG TABS, Take 1 tablet (10 mg total) by mouth every 3 (three) hours as needed for severe pain (pain score 7-10)., Disp: 30 tablet, Rfl: 0   pantoprazole  (PROTONIX ) 40 MG tablet, TAKE ONE TABLET (40 MG TOTAL) BY  MOUTH DAILY. FOR HEARTBURN. (Patient taking differently: Take 40 mg by mouth daily before supper.), Disp: 90 tablet, Rfl: 3   Medications ordered in this encounter:  No orders of the defined types were placed in this encounter.    *If you need refills on other medications prior to your next appointment, please contact your pharmacy*  Follow-Up: Call back or seek an in-person evaluation if the symptoms worsen or if the condition fails to improve as anticipated.  [facility] [phone]  Other Instructions If you have been instructed to have  an in-person evaluation today at a local Urgent Care facility, please use the link below. It will take you to a list of all of our available [facility], including address, phone number and hours of operation. Please do not delay care.  [facility]

## 2023-11-13 NOTE — Assessment & Plan Note (Addendum)
 Has intermittent large lumps on occiput---but nothing palpable now No nodes palpable anywhere now Is going back to Dr Onetha to review whether this pain could have anything to do with past surgery Will check labs  At the end of the visit, she asked for tramadol. I declined

## 2023-11-13 NOTE — Progress Notes (Signed)
 Subjective:    Patient ID: Katie Robbins, female    DOB: 1955-11-08, 68 y.o.   MRN: [id number]  HPI Here due to throat swelling and possible swollen glands  Has noticed more swollen glands in her neck Having pain in occiput at night Lumps on back of head--one was 4 inches big Feels like a tight rubber band around her head No fever No cough, runny nose, sore throat, drainage Feels breathing is hard when her head hurts  No other glands--like in groin or axilla  BC this morning didn't help  Had cervical spine surgeon almost 2 months ago Did really well with this Feels her current problems in head have worsened since this though  Also had ganglionectomy last year Went on stretching machine and this made things worse (in October)  Current Outpatient Medications on File Prior to Visit  Medication Sig Dispense Refill   albuterol  (VENTOLIN  HFA) 108 (90 Base) MCG/ACT inhaler Inhale 2 puffs into the lungs every 6 (six) hours as needed for wheezing or shortness of breath. 8.5 g 0   Alum & Mag Hydroxide-Simeth (GI COCKTAIL) SUSP suspension Take 20 mLs by mouth 2 (two) times daily. Maalox 30 mL, lidocaine  5 mL and dicyclomine 10 mL, dispense 450 mL,  SIG take 15 to 20 mL as needed 3-4 times daily. (Patient taking differently: Take 15-20 mLs by mouth 2 (two) times daily as needed (esophageal spasm). Maalox 30 mL, lidocaine  5 mL and dicyclomine 10 mL, dispense 450 mL,  SIG take 15 to 20 mL as needed 3-4 times daily.) 1200 mL 1   amLODipine  (NORVASC ) 5 MG tablet TAKE ONE TABLET BY MOUTH ONCE DAILY FOR BLOOD PRESSURE 90 tablet 0   amphetamine -dextroamphetamine  (ADDERALL) 20 MG tablet Take 20 mg by mouth See admin instructions. Take 1 tablet (20 mg) by mouth scheduled in the morning & in the afternoon, may take an additional dose in the evening, if needed for focus/attention.     aspirin  EC 81 MG tablet Take 1 tablet (81 mg total) by mouth in the morning. 30 tablet 12   budesonide -formoterol   (SYMBICORT ) 160-4.5 MCG/ACT inhaler INHALE TWO PUFFS INTO THE LUNGS TWO TIMES DAILY 10.2 g 0   carboxymethylcellulose (REFRESH PLUS) 0.5 % SOLN Place 1-2 drops into both eyes 3 (three) times daily as needed (dry/irritated eyes.).     cyanocobalamin  (VITAMIN B12) 1000 MCG/ML injection INJECT ONE VIAL (1 ML) INTO THE MUSCLE EVERY THREE TO FOUR WEEKS 12 mL 0   diazepam  (VALIUM ) 5 MG tablet Take 5 mg by mouth 4 (four) times daily as needed for anxiety.     EPINEPHrine  0.3 mg/0.3 mL IJ SOAJ injection Inject 0.3 mg into the muscle as needed for anaphylaxis. 1 each 0   estradiol  (ESTRACE ) 0.1 MG/GM vaginal cream Place 0.25 Applicatorfuls vaginally at bedtime. 90 g 3   estradiol  (ESTRACE ) 1 MG tablet Take 1 tablet (1 mg total) by mouth daily. 30 tablet 11   levothyroxine  (SYNTHROID ) 100 MCG tablet TAKE ONE TABLET BY MOUTH MONDAY THROUGH SATURDAYS 72 tablet 3   levothyroxine  (SYNTHROID ) 112 MCG tablet TAKE ONE TABLET (112 MCG TOTAL) BY MOUTH AS DIRECTED ON SUNDAYS 12 tablet 3   olmesartan  (BENICAR ) 20 MG tablet TAKE ONE TABLET (20 MG TOTAL) BY MOUTH DAILY. 90 tablet 0   pantoprazole  (PROTONIX ) 40 MG tablet TAKE ONE TABLET (40 MG TOTAL) BY MOUTH DAILY. FOR HEARTBURN. (Patient taking differently: Take 40 mg by mouth daily before supper.) 90 tablet 3   No current  facility-administered medications on file prior to visit.    Allergies  Allergen Reactions   Bactrim [Sulfamethoxazole-Trimethoprim] Hives   Macrobid [Nitrofurantoin Macrocrystal] Hives   Tizanidine Other (See Comments)    Bad Mood change Migraines   Vicodin [Hydrocodone-Acetaminophen ] Swelling    Puffy eyes and face   Trileptal  [Oxcarbazepine ] Rash   Abilify [Aripiprazole] Other (See Comments)    Bad leg cramps   Acetaminophen      Other reaction(s): Puffy face/eyes Patient states that she is able to take acetaminophen    Amaryl [Glimepiride] Other (See Comments)    Not effective   Ambien  [Zolpidem ] Other (See Comments)    Did not work    Cyclobenzaprine  Other (See Comments)    Not effective   Erythromycin Base     Other Reaction(s): Rash   Gabapentin Other (See Comments)    XR makes her Groggy   Hydrocodone Swelling    Eyes and face   Klonopin [Clonazepam] Other (See Comments)    Make her feel groggy   Nexium [Esomeprazole Magnesium] Nausea And Vomiting    Face swelling, hives    Nortriptyline     insomnia   Olanzapine Other (See Comments)    Not effective   Pregabalin Other (See Comments)    Not effective   Prilosec [Omeprazole] Nausea And Vomiting    Face swelling, hives    Quetiapine Other (See Comments)    Groggy   Reglan  [Metoclopramide ] Nausea Only   Savella [Milnacipran] Other (See Comments)    Not effective   Sulfa Antibiotics Hives   Tamoxifen    Tapentadol Other (See Comments)    Not effective   Topiramate Other (See Comments)    Not effective   Trimethoprim     Other reaction(s): Hives   Wasp Venom Hives    swelling   Zoloft  [Sertraline ] Other (See Comments)    Not effective   Erythromycin Rash   Lamotrigine  Rash    Bad mood swings   Latex Rash   Molds & Smuts Anxiety    Black Mold   Tramadol Nausea Only    Not effective    Past Medical History:  Diagnosis Date   Acute thoracic back pain 07/22/2018   ADHD (attention deficit hyperactivity disorder)    Anemia    Abnormal Uterine Bleeding - Resulted in Hysterectomy   Anxiety    Arthritis    Asthma    Chicken pox    Chronic headaches    Chronic kidney disease    CKD Stage 3a. No HD   Cognitive disorder    Concussion 07/2021   Diverticulitis    Essential hypertension    GERD (gastroesophageal reflux disease)    Guillain Barr syndrome (HCC) 1982   H/O breast implant    Heart murmur    Hypertension    Hypothyroidism    Interstitial cystitis    Interstitial cystitis    Mitral prolapse 1985   Non-cardiac chest pain 06/13/2020   Nutcracker esophagus    Osteoarthritis    PONV (postoperative nausea and vomiting)     patient states that she does well with phenergan    Soft tissue mass 03/31/2020   Stroke (HCC)    Multiple TIAs. Last one Feb. 2024    Past Surgical History:  Procedure Laterality Date   ABDOMINAL HYSTERECTOMY  2000/2009   ANTERIOR CERVICAL DECOMP/DISCECTOMY FUSION N/A 09/20/2023   Procedure: ANTERIOR CERVICAL DECOMPRESSION/DISCECTOMY FUSION CERVICAL FOUR-FIVE;  Surgeon: Onetha Kuba, MD;  Location: [facility] OR;  Service: Neurosurgery;  Laterality: N/A;  ACDF C45 3C   APPENDECTOMY  2007   AUGMENTATION MAMMAPLASTY     BELPHAROPTOSIS REPAIR Bilateral    Cosmetic. Eye Lift   Bilateral Cervical Two ganglionectomies  08/2022   BOWEL RESECTION  2007   CARDIAC CATHETERIZATION  2016   Clean   CARPAL TUNNEL RELEASE Bilateral    CHOLECYSTECTOMY  2012   COLONOSCOPY WITH PROPOFOL  N/A 04/13/2020   Procedure: COLONOSCOPY WITH PROPOFOL ;  Surgeon: Unk Corinn Skiff, MD;  Location: [facility] ENDOSCOPY;  Service: Gastroenterology;  Laterality: N/A;   ESOPHAGOGASTRODUODENOSCOPY (EGD) WITH PROPOFOL  N/A 04/13/2020   Procedure: ESOPHAGOGASTRODUODENOSCOPY (EGD) WITH PROPOFOL ;  Surgeon: Unk Corinn Skiff, MD;  Location: [facility] ENDOSCOPY;  Service: Gastroenterology;  Laterality: N/A;   ESOPHAGOGASTRODUODENOSCOPY (EGD) WITH PROPOFOL  N/A 06/06/2023   Procedure: ESOPHAGOGASTRODUODENOSCOPY (EGD) WITH PROPOFOL ;  Surgeon: Unk Corinn Skiff, MD;  Location: [facility] ENDOSCOPY;  Service: Gastroenterology;  Laterality: N/A;   NECK SURGERY  06/11/2016   Fusion C3-C7 Dr. Onetha   POSTERIOR CERVICAL LAMINECTOMY Bilateral 08/16/2022   Procedure: Bilateral Cervical Two ganglionectomies;  Surgeon: Cheryle Debby LABOR, MD;  Location: [facility] OR;  Service: Neurosurgery;  Laterality: Bilateral;   SMALL INTESTINE SURGERY  2007   Bowel resection   TONSILLECTOMY     VESICO-VAGINAL FISTULA REPAIR  2007    Family History  Problem Relation Age of Onset   Hypertension Mother    GER disease Mother    Polymyalgia rheumatica Mother    COPD Mother     Osteoarthritis Mother    Osteoporosis Mother    Other Mother        Giant Cell Arteritis    GER disease Father    Heart attack Father    Spondylolysis Father     Social History   Socioeconomic History   Marital status: Married    Spouse name: Reyes   Number of children: 2   Years of education: Some college   Highest education level: Not on file  Occupational History   Occupation: Retired  Tobacco Use   Smoking status: Never   Smokeless tobacco: Never  Vaping Use   Vaping status: Never Used  Substance and Sexual Activity   Alcohol  use: Yes    Alcohol /week: 3.0 standard drinks of alcohol     Types: 2 Cans of beer, 1 Shots of liquor per week    Comment: occasional   Drug use: No   Sexual activity: Yes    Birth control/protection: Surgical  Other Topics Concern   Not on file  Social History Narrative   Lives with husband   Caffeine use: rare   Right handed   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  4 years active-2 years inactive   Social Drivers of Corporate investment banker Strain: Not on file  Food Insecurity: No Food Insecurity (09/20/2023)   Hunger Vital Sign    Worried About Running Out of Food in the Last Year: Never true    Ran Out of Food in the Last Year: Never true  Transportation Needs: No Transportation Needs (09/20/2023)   PRAPARE - Administrator, Civil Service (Medical): No    Lack of Transportation (Non-Medical): No  Physical Activity: Not on file  Stress: Not on file  Social Connections: Socially Integrated (09/20/2023)   Social Connection and Isolation Panel    Frequency of Communication with Friends and Family: More than three times a week    Frequency of Social Gatherings with Friends and Family: More than three times a week    Attends Religious Services: More than  4 times per year    Active Member of Clubs or Organizations: No    Attends Banker Meetings: More than 4 times per year    Marital Status: Married  Catering manager Violence: Not  At Risk (09/20/2023)   Humiliation, Afraid, Rape, and Kick questionnaire    Fear of Current or Ex-Partner: No    Emotionally Abused: No    Physically Abused: No    Sexually Abused: No   Review of Systems Has lost a few pounds    Objective:   Physical Exam Constitutional:      Appearance: Normal appearance.  HENT:     Head:     Comments: No masses Tender occiput without inflammation or lesions    Right Ear: Tympanic membrane and ear canal normal.     Left Ear: Tympanic membrane and ear canal normal.     Mouth/Throat:     Pharynx: No oropharyngeal exudate or posterior oropharyngeal erythema.  Neck:     Comments: No masses Cardiovascular:     Rate and Rhythm: Normal rate and regular rhythm.     Heart sounds: No murmur heard.    No gallop.  Pulmonary:     Effort: Pulmonary effort is normal.     Breath sounds: Normal breath sounds. No wheezing or rales.  Abdominal:     Palpations: Abdomen is soft.     Tenderness: There is no abdominal tenderness.     Comments: No HSM  Musculoskeletal:     Cervical back: Neck supple.     Right lower leg: No edema.     Left lower leg: No edema.  Lymphadenopathy:     Head:     Right side of head: No submental, submandibular, tonsillar, preauricular, posterior auricular or occipital adenopathy.     Left side of head: No submental, submandibular, tonsillar, preauricular, posterior auricular or occipital adenopathy.     Cervical: No cervical adenopathy.     Upper Body:     Right upper body: No supraclavicular or axillary adenopathy.     Left upper body: No supraclavicular or axillary adenopathy.     Lower Body: No right inguinal adenopathy. No left inguinal adenopathy.  Neurological:     Mental Status: She is alert.            Assessment & Plan:

## 2023-11-13 NOTE — Progress Notes (Signed)
 Virtual Visit Consent   Katie Robbins, you are scheduled for a virtual visit with a [place] provider today. Just as with appointments in the office, your consent must be obtained to participate. Your consent will be active for this visit and any virtual visit you may have with one of our providers in the next 365 days. If you have a MyChart account, a copy of this consent can be sent to you electronically.  As this is a virtual visit, video technology does not allow for your provider to perform a traditional examination. This may limit your provider's ability to fully assess your condition. If your provider identifies any concerns that need to be evaluated in person or the need to arrange testing (such as labs, EKG, etc.), we will make arrangements to do so. Although advances in technology are sophisticated, we cannot ensure that it will always work on either your end or our end. If the connection with a video visit is poor, the visit may have to be switched to a telephone visit. With either a video or telephone visit, we are not always able to ensure that we have a secure connection.  By engaging in this virtual visit, you consent to the provision of healthcare and authorize for your insurance to be billed (if applicable) for the services provided during this visit. Depending on your insurance coverage, you may receive a charge related to this service.  I need to obtain your verbal consent now. Are you willing to proceed with your visit today? Katie Robbins has provided verbal consent on 11/13/2023 for a virtual visit (video or telephone). Katie Robbins, [place]  Date: 11/13/2023 8:29 AM   Virtual Visit via Video Note   I, Katie Robbins, connected with  Katie Robbins  ([id number], [birth year]) on 11/13/23 at  8:15 AM EDT by a video-enabled telemedicine application and verified that I am speaking with the correct person using two identifiers.  Location: Patient: Virtual Visit Location  Patient: Home Provider: Virtual Visit Location Provider: Home Office   I discussed the limitations of evaluation and management by telemedicine and the availability of in person appointments. The patient expressed understanding and agreed to proceed.    History of Present Illness: Katie Robbins is a 68 y.o. who identifies as a female who was assigned female at birth, and is being seen today for some ongoing and worsening occipital pain and heaviness of her head. Notes when she turns her head she feels like fluid is moving in her head. Associated with some intermittent swollen and tender lymph nodes from time to time. Has upcoming follow-up with her Neurosurgeon, Dr. Onetha but is concerned due to worsening symptoms and would like her WBC counts checked to assess for infection or to determine if other things are needed more acutely.   HPI: HPI  Problems:  Patient Active Problem List   Diagnosis Date Noted   Status post cervical spinal fusion 09/20/2023   Spinal stenosis of cervical region 09/20/2023   Cervical radiculopathy 04/25/2023   Myofascial pain 04/25/2023   Transient ischemic attack 04/25/2023   Non-cardiac chest pain 01/17/2023   Mitral valve prolapse 01/17/2023   Imbalance 12/19/2022   Dizziness 07/12/2022   AKI (acute kidney injury) (HCC) 07/12/2022   Precordial pain 05/30/2022   Bladder pain 05/23/2022   Electrocution 04/17/2022   Lower extremity weakness 12/12/2021   Seizure-like activity (HCC) 07/03/2021   Esophageal spasm 12/09/2020   History of depression 08/16/2020   Sleep  disorder, unspecified 08/16/2020   Esophageal dysphagia    Body mass index (BMI) 27.0-27.9, adult 10/22/2019   CKD (chronic kidney disease) stage 3, GFR 30-59 ml/min (HCC) 06/30/2019   Vitamin B 12 deficiency 01/27/2019   Panic disorder 12/29/2018   Mood disorder as late effect of traumatic brain injury (HCC) 08/07/2018   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)  08/07/2018   Neuropathy 07/22/2018   Prediabetes 07/08/2018   Numbness and tingling 07/08/2018   Environmental and seasonal allergies 04/24/2018   Chronic fatigue 01/23/2018   Ataxia 07/16/2017   Primary osteoarthritis of left knee 05/29/2017   S/P carpal tunnel release 05/29/2017   Primary osteoarthritis of both hands 05/29/2017   History of bilateral breast implants 05/29/2017   Interstitial cystitis 05/29/2017   History of mitral valve prolapse 05/29/2017   History of diverticulitis 05/29/2017   Carpal tunnel syndrome 05/16/2016   Stenosis of intervertebral foramina 05/16/2016   Degeneration of intervertebral disc of cervical spine without prolapsed disc 05/01/2016   Cervical disc disorder with radiculopathy of cervical region 04/12/2016   Preventative health care 11/28/2015   Esophageal reflux 08/19/2015   Essential hypertension 08/19/2015   Hypothyroidism 08/19/2015   Asthma 08/19/2015   Chronic headaches 08/19/2015   Estrogen deficiency 08/19/2015   Nutcracker esophagus 08/12/2005    Allergies:  Allergies  Allergen Reactions   Bactrim [Sulfamethoxazole-Trimethoprim] Hives   Macrobid [Nitrofurantoin Macrocrystal] Hives   Tizanidine Other (See Comments)    Bad Mood change Migraines   Vicodin [Hydrocodone-Acetaminophen ] Swelling    Puffy eyes and face   Trileptal  [Oxcarbazepine ] Rash   Abilify [Aripiprazole] Other (See Comments)    Bad leg cramps   Acetaminophen      Other reaction(s): Puffy face/eyes Patient states that she is able to take acetaminophen    Amaryl [Glimepiride] Other (See Comments)    Not effective   Ambien  [Zolpidem ] Other (See Comments)    Did not work   Cyclobenzaprine  Other (See Comments)    Not effective   Erythromycin Base     Other Reaction(s): Rash   Gabapentin Other (See Comments)    XR makes her Groggy   Hydrocodone Swelling    Eyes and face   Klonopin [Clonazepam] Other (See Comments)    Make her feel groggy   Nexium [Esomeprazole  Magnesium] Nausea And Vomiting    Face swelling, hives    Nortriptyline     insomnia   Olanzapine Other (See Comments)    Not effective   Pregabalin Other (See Comments)    Not effective   Prilosec [Omeprazole] Nausea And Vomiting    Face swelling, hives    Quetiapine Other (See Comments)    Groggy   Reglan  [Metoclopramide ] Nausea Only   Savella [Milnacipran] Other (See Comments)    Not effective   Sulfa Antibiotics Hives   Tamoxifen    Tapentadol Other (See Comments)    Not effective   Topiramate Other (See Comments)    Not effective   Trimethoprim     Other reaction(s): Hives   Wasp Venom Hives    swelling   Zoloft  [Sertraline ] Other (See Comments)    Not effective   Erythromycin Rash   Lamotrigine  Rash    Bad mood swings   Latex Rash   Molds & Smuts Anxiety    Black Mold   Tramadol Nausea Only    Not effective   Medications:  Current Outpatient Medications:    albuterol  (VENTOLIN  HFA) 108 (90 Base) MCG/ACT inhaler, Inhale 2  puffs into the lungs every 6 (six) hours as needed for wheezing or shortness of breath., Disp: 8.5 g, Rfl: 0   Alum & Mag Hydroxide-Simeth (GI COCKTAIL) SUSP suspension, Take 20 mLs by mouth 2 (two) times daily. Maalox 30 mL, lidocaine  5 mL and dicyclomine 10 mL, dispense 450 mL,  SIG take 15 to 20 mL as needed 3-4 times daily. (Patient taking differently: Take 15-20 mLs by mouth 2 (two) times daily as needed (esophageal spasm). Maalox 30 mL, lidocaine  5 mL and dicyclomine 10 mL, dispense 450 mL,  SIG take 15 to 20 mL as needed 3-4 times daily.), Disp: 1200 mL, Rfl: 1   amLODipine  (NORVASC ) 5 MG tablet, TAKE ONE TABLET BY MOUTH ONCE DAILY FOR BLOOD PRESSURE, Disp: 90 tablet, Rfl: 0   amphetamine -dextroamphetamine  (ADDERALL) 20 MG tablet, Take 20 mg by mouth See admin instructions. Take 1 tablet (20 mg) by mouth scheduled in the morning & in the afternoon, may take an additional dose in the evening, if needed for focus/attention., Disp: , Rfl:     aspirin  EC 81 MG tablet, Take 1 tablet (81 mg total) by mouth in the morning., Disp: 30 tablet, Rfl: 12   budesonide -formoterol  (SYMBICORT ) 160-4.5 MCG/ACT inhaler, INHALE TWO PUFFS INTO THE LUNGS TWO TIMES DAILY, Disp: 10.2 g, Rfl: 0   carboxymethylcellulose (REFRESH PLUS) 0.5 % SOLN, Place 1-2 drops into both eyes 3 (three) times daily as needed (dry/irritated eyes.)., Disp: , Rfl:    cyanocobalamin  (VITAMIN B12) 1000 MCG/ML injection, INJECT ONE VIAL (1 ML) INTO THE MUSCLE EVERY THREE TO FOUR WEEKS, Disp: 12 mL, Rfl: 0   diazepam  (VALIUM ) 5 MG tablet, Take 5 mg by mouth 4 (four) times daily as needed for anxiety., Disp: , Rfl:    EPINEPHrine  0.3 mg/0.3 mL IJ SOAJ injection, Inject 0.3 mg into the muscle as needed for anaphylaxis., Disp: 1 each, Rfl: 0   estradiol  (ESTRACE ) 0.1 MG/GM vaginal cream, Place 0.25 Applicatorfuls vaginally at bedtime., Disp: 90 g, Rfl: 3   estradiol  (ESTRACE ) 1 MG tablet, Take 1 tablet (1 mg total) by mouth daily., Disp: 30 tablet, Rfl: 11   levothyroxine  (SYNTHROID ) 100 MCG tablet, TAKE ONE TABLET BY MOUTH MONDAY THROUGH SATURDAYS, Disp: 72 tablet, Rfl: 3   levothyroxine  (SYNTHROID ) 112 MCG tablet, TAKE ONE TABLET (112 MCG TOTAL) BY MOUTH AS DIRECTED ON SUNDAYS, Disp: 12 tablet, Rfl: 3   olmesartan  (BENICAR ) 20 MG tablet, TAKE ONE TABLET (20 MG TOTAL) BY MOUTH DAILY., Disp: 90 tablet, Rfl: 0   oxyCODONE  10 MG TABS, Take 1 tablet (10 mg total) by mouth every 3 (three) hours as needed for severe pain (pain score 7-10)., Disp: 30 tablet, Rfl: 0   pantoprazole  (PROTONIX ) 40 MG tablet, TAKE ONE TABLET (40 MG TOTAL) BY MOUTH DAILY. FOR HEARTBURN. (Patient taking differently: Take 40 mg by mouth daily before supper.), Disp: 90 tablet, Rfl: 3  Observations/Objective: Patient is well-developed, well-nourished in no acute distress.  Resting comfortably  at home.  Head is normocephalic, atraumatic.  No labored breathing.  Speech is clear and coherent with logical content.   Patient is alert and oriented at baseline.   Assessment and Plan: 1. Chronic neck pain (Primary)  2. Fullness in head  Needs in-person evaluation. Reiterated several times to patient that we cannot order imaging or blood work and recommend that she is evaluated in person with her PCP or at local urgent care for initial evaluation. Follow-up with Neurosurgery in 5 days as scheduled if they are unable  to work her in sooner.   Follow Up Instructions: I discussed the assessment and treatment plan with the patient. The patient was provided an opportunity to ask questions and all were answered. The patient agreed with the plan and demonstrated an understanding of the instructions.  A copy of instructions were sent to the patient via MyChart unless otherwise noted below.   The patient was advised to call back or seek an in-person evaluation if the symptoms worsen or if the condition fails to improve as anticipated.    Katie Velma Lunger, PA-C

## 2023-11-14 ENCOUNTER — Ambulatory Visit: Payer: Self-pay | Admitting: Internal Medicine

## 2023-11-19 ENCOUNTER — Ambulatory Visit: Payer: Medicare Other | Admitting: Internal Medicine

## 2023-11-19 DIAGNOSIS — M542 Cervicalgia: Secondary | ICD-10-CM | POA: Diagnosis not present

## 2023-11-21 ENCOUNTER — Other Ambulatory Visit: Payer: Self-pay | Admitting: Emergency Medicine

## 2023-11-22 ENCOUNTER — Ambulatory Visit: Admitting: Internal Medicine

## 2023-11-22 ENCOUNTER — Encounter: Payer: Self-pay | Admitting: Internal Medicine

## 2023-11-22 VITALS — BP 124/80 | HR 72 | Ht 63.0 in | Wt 133.0 lb

## 2023-11-22 DIAGNOSIS — E039 Hypothyroidism, unspecified: Secondary | ICD-10-CM

## 2023-11-22 MED ORDER — LEVOTHYROXINE SODIUM 100 MCG PO TABS: 100.0000 ug | ORAL_TABLET | ORAL | 3 refills | Status: DC

## 2023-11-22 MED ORDER — LEVOTHYROXINE SODIUM 112 MCG PO TABS: 112.0000 ug | ORAL_TABLET | ORAL | 3 refills | Status: DC

## 2023-11-22 NOTE — Progress Notes (Signed)
 Name: Katie Robbins  MRN/ DOB: [id number], [birth year]    Age/ Sex: 68 y.o., female    PCP: Gretta Comer POUR, NP   Reason for Endocrinology Evaluation: Hypothyroidism     Date of Initial Endocrinology Evaluation: 12/06/2021    HPI: Katie Robbins is a 68 y.o. female with a past medical history of HTN, asthma, hypothyroid, neuropathy,Hx Guillain-Barr syndrome (1982). The patient presented for initial endocrinology clinic visit on 12/06/2021  for consultative assistance with her Hypothyroidism.   She has been on levothyroxine  1994   Denies prior exposure to radiation and thyroid  sx   Over the years she has had fluctuating TSH ranging from 0.11 uIU/mL in 07/2021 to 7.05 uIU/mL in 08/2019    Mother with PMR , giant cell arteritis and thyroid  disease   She has had neck surgery in 2018 with a repeat surgery of the cervical decompression/discectomy fusion  09/2023  She is s/p ganglionectomy's due to bilateral occipital neuralgia, medically refractory in 2024.   Patient with multiple neurological symptoms, and a previous diagnosis of GBS, follows with neurology, and associates levothyroxine  with GBS      SUBJECTIVE:    Today (11/22/23):  Katie Robbins is here for a follow up on Hypothyroidism.  Since her last visit here she underwent anterior cervical decompression/discectomy fusion 09/2023 with Dr. Onetha Neck pain has improved tremendously  Has a referral to pain management for head pain following gangliectomy  Denies palpitations  Has chronic  constipation - on miralax  Patient has noted weight loss, improved hair growth, as well as improved nail growth Uses pill box  No Biotin    Continue levothyroxine  100 mcg x 6 days a week Monday through Saturday  Continue levothyroxine  112 mcg x 1 days  week Sunday     HISTORY:  Past Medical History:  Past Medical History:  Diagnosis Date   Acute thoracic back pain 07/22/2018   ADHD (attention deficit hyperactivity  disorder)    Anemia    Abnormal Uterine Bleeding - Resulted in Hysterectomy   Anxiety    Arthritis    Asthma    Chicken pox    Chronic headaches    Chronic kidney disease    CKD Stage 3a. No HD   Cognitive disorder    Concussion 07/2021   Diverticulitis    Essential hypertension    GERD (gastroesophageal reflux disease)    Guillain Barr syndrome (HCC) 1982   H/O breast implant    Heart murmur    Hypertension    Hypothyroidism    Interstitial cystitis    Interstitial cystitis    Mitral prolapse 1985   Non-cardiac chest pain 06/13/2020   Nutcracker esophagus    Osteoarthritis    PONV (postoperative nausea and vomiting)    patient states that she does well with phenergan    Soft tissue mass 03/31/2020   Stroke (HCC)    Multiple TIAs. Last one Feb. 2024   Past Surgical History:  Past Surgical History:  Procedure Laterality Date   ABDOMINAL HYSTERECTOMY  2000/2009   ANTERIOR CERVICAL DECOMP/DISCECTOMY FUSION N/A 09/20/2023   Procedure: ANTERIOR CERVICAL DECOMPRESSION/DISCECTOMY FUSION CERVICAL FOUR-FIVE;  Surgeon: Onetha Kuba, MD;  Location: [facility] OR;  Service: Neurosurgery;  Laterality: N/A;  ACDF C45 3C   APPENDECTOMY  2007   AUGMENTATION MAMMAPLASTY     BELPHAROPTOSIS REPAIR Bilateral    Cosmetic. Eye Lift   Bilateral Cervical Two ganglionectomies  08/2022   BOWEL RESECTION  2007   CARDIAC  CATHETERIZATION  2016   Clean   CARPAL TUNNEL RELEASE Bilateral    CHOLECYSTECTOMY  2012   COLONOSCOPY WITH PROPOFOL  N/A 04/13/2020   Procedure: COLONOSCOPY WITH PROPOFOL ;  Surgeon: Unk Corinn Skiff, MD;  Location: [facility] ENDOSCOPY;  Service: Gastroenterology;  Laterality: N/A;   ESOPHAGOGASTRODUODENOSCOPY (EGD) WITH PROPOFOL  N/A 04/13/2020   Procedure: ESOPHAGOGASTRODUODENOSCOPY (EGD) WITH PROPOFOL ;  Surgeon: Unk Corinn Skiff, MD;  Location: [facility] ENDOSCOPY;  Service: Gastroenterology;  Laterality: N/A;   ESOPHAGOGASTRODUODENOSCOPY (EGD) WITH PROPOFOL  N/A 06/06/2023   Procedure:  ESOPHAGOGASTRODUODENOSCOPY (EGD) WITH PROPOFOL ;  Surgeon: Unk Corinn Skiff, MD;  Location: [facility] ENDOSCOPY;  Service: Gastroenterology;  Laterality: N/A;   NECK SURGERY  06/11/2016   Fusion C3-C7 Dr. Onetha   POSTERIOR CERVICAL LAMINECTOMY Bilateral 08/16/2022   Procedure: Bilateral Cervical Two ganglionectomies;  Surgeon: Cheryle Debby LABOR, MD;  Location: [facility] OR;  Service: Neurosurgery;  Laterality: Bilateral;   SMALL INTESTINE SURGERY  2007   Bowel resection   TONSILLECTOMY     VESICO-VAGINAL FISTULA REPAIR  2007    Social History:  reports that she has never smoked. She has never used smokeless tobacco. She reports current alcohol  use of about 3.0 standard drinks of alcohol  per week. She reports that she does not use drugs. Family History: family history includes COPD in her mother; GER disease in her father and mother; Heart attack in her father; Hypertension in her mother; Osteoarthritis in her mother; Osteoporosis in her mother; Other in her mother; Polymyalgia rheumatica in her mother; Spondylolysis in her father.   HOME MEDICATIONS: Allergies as of 11/22/2023       Reactions   Bactrim [sulfamethoxazole-trimethoprim] Hives   Macrobid [nitrofurantoin Macrocrystal] Hives   Tizanidine Other (See Comments)   Bad Mood change Migraines   Vicodin [hydrocodone-acetaminophen ] Swelling   Puffy eyes and face   Trileptal  [oxcarbazepine ] Rash   Abilify [aripiprazole] Other (See Comments)   Bad leg cramps   Acetaminophen     Other reaction(s): Puffy face/eyes Patient states that she is able to take acetaminophen    Amaryl [glimepiride] Other (See Comments)   Not effective   Ambien  [zolpidem ] Other (See Comments)   Did not work   Cyclobenzaprine  Other (See Comments)   Not effective   Erythromycin Base    Other Reaction(s): Rash   Gabapentin Other (See Comments)   XR makes her Groggy   Hydrocodone Swelling   Eyes and face   Klonopin [clonazepam] Other (See Comments)   Make her feel  groggy   Nexium [esomeprazole Magnesium] Nausea And Vomiting   Face swelling, hives    Nortriptyline    insomnia   Olanzapine Other (See Comments)   Not effective   Pregabalin Other (See Comments)   Not effective   Prilosec [omeprazole] Nausea And Vomiting   Face swelling, hives    Quetiapine Other (See Comments)   Groggy   Reglan  [metoclopramide ] Nausea Only   Savella [milnacipran] Other (See Comments)   Not effective   Sulfa Antibiotics Hives   Tamoxifen    Tapentadol Other (See Comments)   Not effective   Topiramate Other (See Comments)   Not effective   Trimethoprim    Other reaction(s): Hives   Wasp Venom Hives   swelling   Zoloft  [sertraline ] Other (See Comments)   Not effective   Erythromycin Rash   Lamotrigine  Rash   Bad mood swings   Latex Rash   Molds & Smuts Anxiety   Black Mold   Tramadol Nausea Only   Not effective  Medication List        Accurate as of November 22, 2023  1:56 PM. If you have any questions, ask your nurse or doctor.          albuterol  108 (90 Base) MCG/ACT inhaler Commonly known as: VENTOLIN  HFA Inhale 2 puffs into the lungs every 6 (six) hours as needed for wheezing or shortness of breath.   amLODipine  5 MG tablet Commonly known as: NORVASC  TAKE ONE TABLET BY MOUTH ONCE DAILY FOR BLOOD PRESSURE   amphetamine -dextroamphetamine  20 MG tablet Commonly known as: ADDERALL Take 20 mg by mouth See admin instructions. Take 1 tablet (20 mg) by mouth scheduled in the morning & in the afternoon, may take an additional dose in the evening, if needed for focus/attention.   aspirin  EC 81 MG tablet Take 1 tablet (81 mg total) by mouth in the morning.   carboxymethylcellulose 0.5 % Soln Commonly known as: REFRESH PLUS Place 1-2 drops into both eyes 3 (three) times daily as needed (dry/irritated eyes.).   cyanocobalamin  1000 MCG/ML injection Commonly known as: VITAMIN B12 INJECT ONE VIAL (1 ML) INTO THE MUSCLE EVERY THREE TO FOUR  WEEKS   diazepam  5 MG tablet Commonly known as: VALIUM  Take 5 mg by mouth 4 (four) times daily as needed for anxiety.   EPINEPHrine  0.3 mg/0.3 mL Soaj injection Commonly known as: EPI-PEN Inject 0.3 mg into the muscle as needed for anaphylaxis.   estradiol  0.1 MG/GM vaginal cream Commonly known as: ESTRACE  Place 0.25 Applicatorfuls vaginally at bedtime.   estradiol  1 MG tablet Commonly known as: ESTRACE  Take 1 tablet (1 mg total) by mouth daily.   gi cocktail Susp suspension Take 20 mLs by mouth 2 (two) times daily. Maalox 30 mL, lidocaine  5 mL and dicyclomine 10 mL, dispense 450 mL,  SIG take 15 to 20 mL as needed 3-4 times daily. What changed:  how much to take when to take this reasons to take this   levothyroxine  112 MCG tablet Commonly known as: SYNTHROID  TAKE ONE TABLET (112 MCG TOTAL) BY MOUTH AS DIRECTED ON SUNDAYS   levothyroxine  100 MCG tablet Commonly known as: SYNTHROID  TAKE ONE TABLET BY MOUTH MONDAY THROUGH SATURDAYS   olmesartan  20 MG tablet Commonly known as: BENICAR  TAKE ONE TABLET (20 MG TOTAL) BY MOUTH DAILY.   pantoprazole  40 MG tablet Commonly known as: PROTONIX  TAKE ONE TABLET (40 MG TOTAL) BY MOUTH DAILY. FOR HEARTBURN. What changed: See the new instructions.   Symbicort  160-4.5 MCG/ACT inhaler Generic drug: budesonide -formoterol  INHALE TWO PUFFS INTO THE LUNGS TWO TIMES DAILY          REVIEW OF SYSTEMS: A comprehensive ROS was conducted with the patient and is negative except as per HPI    OBJECTIVE:  VS: BP 124/80 (BP Location: Left Arm, Patient Position: Sitting, Cuff Size: Normal)   Pulse 72   Ht 5' 3 (1.6 m)   Wt 133 lb (60.3 kg)   SpO2 95%   BMI 23.56 kg/m   Wt Readings from Last 3 Encounters:  11/22/23 133 lb (60.3 kg)  11/13/23 132 lb (59.9 kg)  09/20/23 134 lb (60.8 kg)     EXAM: General: Pt appears well and is in NAD  Neck: General: Supple without adenopathy. Thyroid : Thyroid  size normal.  No goiter or  nodules appreciated.  Lungs: Clear with good BS bilat   Heart: Auscultation: RRR.  Mental Status: Judgment, insight: Intact Orientation: Oriented to time, place, and person Mood and affect: No depression, anxiety, or agitation  DATA REVIEWED:   Latest Reference Range & Units 11/13/23 10:18  TSH 0.35 - 5.50 uIU/mL 0.66     Latest Reference Range & Units 11/13/23 10:18  Sodium 135 - 145 mEq/L 139  Potassium 3.5 - 5.1 mEq/L 4.8  Chloride 96 - 112 mEq/L 103  CO2 19 - 32 mEq/L 27  Glucose 70 - 99 mg/dL 99  BUN 6 - 23 mg/dL 22  Creatinine 9.59 - 8.79 mg/dL 8.72 (H)  Calcium 8.4 - 10.5 mg/dL 9.5  Alkaline Phosphatase 39 - 117 U/L 66  Albumin 3.5 - 5.2 g/dL 4.4  AST 0 - 37 U/L 10  ALT 0 - 35 U/L 5  Total Protein 6.0 - 8.3 g/dL 7.2  Total Bilirubin 0.2 - 1.2 mg/dL 0.3  GFR >39.99 mL/min 43.57 (L)      ASSESSMENT/PLAN/RECOMMENDATIONS:   Hypothyroidism:  -Patient is clinically euthyroid -No local neck symptoms -She feels better when her TSH is within mid range -TSH within normal range, but it has trended down, TSH continues to fluctuate, we have opted to repeat in 2 months and not to make any changes at this time   Medications : Continue levothyroxine  100 mcg x 6 days a week Continue levothyroxine  112 mcg x  1 day a week (Sunday)   Follow-up in 4 months   Signed electronically by: Stefano Redgie Butts, MD  [facility] Endocrinology  [facility] [address] [place], [place] [address] Phone: [phone] FAX: [phone]   CC: Gretta Comer POUR, NP [address] [place] [address] Phone: [phone] Fax: [phone]   Return to Endocrinology clinic as below: Future Appointments  Date Time Provider Department Center  12/11/2023  4:00 PM Shelah Lamar RAMAN, MD [facility] None  12/20/2023 11:40 AM Gretta Comer POUR, NP [facility]

## 2023-12-11 ENCOUNTER — Ambulatory Visit: Admitting: Emergency Medicine

## 2023-12-11 ENCOUNTER — Encounter: Payer: Self-pay | Admitting: Emergency Medicine

## 2023-12-11 VITALS — BP 145/82 | HR 92 | Temp 98.0°F | Ht 63.0 in | Wt 134.0 lb

## 2023-12-11 DIAGNOSIS — J45909 Unspecified asthma, uncomplicated: Secondary | ICD-10-CM | POA: Diagnosis not present

## 2023-12-11 DIAGNOSIS — J4540 Moderate persistent asthma, uncomplicated: Secondary | ICD-10-CM

## 2023-12-11 MED ORDER — ALBUTEROL SULFATE HFA 108 (90 BASE) MCG/ACT IN AERS
2.0000 | INHALATION_SPRAY | Freq: Four times a day (QID) | RESPIRATORY_TRACT | 5 refills | Status: AC | PRN
Start: 2023-12-11 — End: ?

## 2023-12-11 MED ORDER — BUDESONIDE-FORMOTEROL FUMARATE 160-4.5 MCG/ACT IN AERO: 2.0000 | INHALATION_SPRAY | Freq: Two times a day (BID) | RESPIRATORY_TRACT | 5 refills | Status: DC

## 2023-12-11 NOTE — Progress Notes (Signed)
 Subjective:    Patient ID: Katie Robbins, female    DOB: [birth year], 68 y.o.   MRN: [id number]  HPI  ROV 09/25/2022 --follow-up visit for 68 year old woman with a history of Guillain-Barr syndrome and suspected associated restrictive lung disease, GERD, hypertension, MVP, anxiety.  I have followed her for mild intermittent asthma.  She has reassuring pulmonary function testing..  She has had a clinical benefit from Symbicort .  Today she reports that since I last saw her she had cervical ganglionectomies. Her breathing has been doing well. No cough or wheeze.  She is not taking symbicort  on a schedule.   ROV 12/11/2023 --follow-up visit for 68 year old woman with restrictive lung disease due to Guillain-Barr syndrome, mitral valve prolapse, anxiety, hypertension, GERD, mild intermittent asthma.  She has reassuring pulmonary function testing done in September 2023 but has had clinical benefit from Symbicort  in the past.  She reports today that she had cervical discectomy in April 2025 w Dr Onetha. She has some intermittent SOB, has used her symbicort  prn - about once a week when she has dyspnea, fatigue.  When she uses it, it is bid.    Past Medical History:  Diagnosis Date   Acute thoracic back pain 07/22/2018   ADHD (attention deficit hyperactivity disorder)    Anemia    Abnormal Uterine Bleeding - Resulted in Hysterectomy   Anxiety    Arthritis    Asthma    Chicken pox    Chronic headaches    Chronic kidney disease    CKD Stage 3a. No HD   Cognitive disorder    Concussion 07/2021   Diverticulitis    Essential hypertension    GERD (gastroesophageal reflux disease)    Guillain Barr syndrome (HCC) 1982   H/O breast implant    Heart murmur    Hypertension    Hypothyroidism    Interstitial cystitis    Interstitial cystitis    Mitral prolapse 1985   Non-cardiac chest pain 06/13/2020   Nutcracker esophagus    Osteoarthritis    PONV (postoperative nausea and vomiting)    patient  states that she does well with phenergan    Soft tissue mass 03/31/2020   Stroke (HCC)    Multiple TIAs. Last one Feb. 2024     Review of Systems  Constitutional:  Negative for fever and unexpected weight change.  HENT:  Negative for congestion, dental problem, ear pain, nosebleeds, postnasal drip, rhinorrhea, sinus pressure, sneezing, sore throat and trouble swallowing.   Eyes:  Negative for redness and itching.  Respiratory:  Positive for cough, chest tightness and shortness of breath. Negative for wheezing.   Cardiovascular:  Negative for palpitations and leg swelling.  Gastrointestinal:  Negative for nausea and vomiting.  Genitourinary:  Negative for dysuria.  Musculoskeletal:  Negative for joint swelling.  Skin:  Negative for rash.  Neurological:  Negative for headaches.  Hematological:  Does not bruise/bleed easily.  Psychiatric/Behavioral:  Negative for dysphoric mood. The patient is not nervous/anxious.         Objective:   Physical Exam Vitals:   12/11/23 1551  BP: (!) 145/82  Pulse: 92  Temp: 98 F (36.7 C)  TempSrc: Temporal  SpO2: 97%  Weight: 134 lb (60.8 kg)  Height: 5' 3 (1.6 m)   Gen: Pleasant, well-nourished, in no distress, scattered affect  ENT: No lesions,  mouth clear,  oropharynx clear, no postnasal drip  Neck: No JVD, no stridor  Lungs: No use of accessory muscles, no wheeze or  crackles  Cardiovascular: RRR, heart sounds normal, no murmur or gallops, no peripheral edema  Musculoskeletal: Sternal pain was not assessed today  Neuro: alert, tangential, Some cognitive impairment  Skin: Warm, no lesions or rash      Assessment & Plan:  Asthma Possible asthma based on clinical symptoms.  Her pulmonary function testing has been reassuring.  She is only intermittently using Symbicort .  As before I have explained to her that typically this is a maintenance medication.  Discussed possibly changing this to Airsupra.  In the end I think it is  probably best for her to continue the maintenance routine that she is used to and that has benefited her.  She will keep Symbicort  available to use for stretches when she needs it.  At those times she will take it twice daily.  She also has albuterol  available to use as needed.  Continue using your Symbicort  2 puffs twice a day on the days when you need it.  Rinse and gargle after using. Keep your albuterol  available to use 2 puffs when needed for shortness of breath, chest tightness, wheezing. Follow with Dr. Shelah in 12 months or sooner if you have any problems.    Lamar Shelah, MD, PhD 12/11/2023, 4:14 PM [facility] [phone] or if no answer [phone]

## 2023-12-11 NOTE — Assessment & Plan Note (Signed)
 Possible asthma based on clinical symptoms.  Her pulmonary function testing has been reassuring.  She is only intermittently using Symbicort .  As before I have explained to her that typically this is a maintenance medication.  Discussed possibly changing this to Airsupra.  In the end I think it is probably best for her to continue the maintenance routine that she is used to and that has benefited her.  She will keep Symbicort  available to use for stretches when she needs it.  At those times she will take it twice daily.  She also has albuterol  available to use as needed.  Continue using your Symbicort  2 puffs twice a day on the days when you need it.  Rinse and gargle after using. Keep your albuterol  available to use 2 puffs when needed for shortness of breath, chest tightness, wheezing. Follow with Dr. Shelah in 12 months or sooner if you have any problems.

## 2023-12-11 NOTE — Patient Instructions (Signed)
 Continue using your Symbicort  2 puffs twice a day on the days when you need it.  Rinse and gargle after using. Keep your albuterol  available to use 2 puffs when needed for shortness of breath, chest tightness, wheezing. Follow with Dr. Shelah in 12 months or sooner if you have any problems.

## 2023-12-13 DIAGNOSIS — R202 Paresthesia of skin: Secondary | ICD-10-CM

## 2023-12-13 DIAGNOSIS — R519 Headache, unspecified: Secondary | ICD-10-CM

## 2023-12-13 NOTE — Addendum Note (Signed)
 Addended by: Elih Mooney K on: 12/13/2023 05:14 PM   Modules accepted: Orders

## 2023-12-13 NOTE — Telephone Encounter (Signed)
 Copied from CRM [id number]. Topic: General - Other >> Dec 13, 2023  1:48 PM Thersia C wrote: Reason for CRM: Patient called in regarding a message through mychart with NP Comer Gaskins, would like for her to look at that as soon as she can.

## 2023-12-18 ENCOUNTER — Encounter: Payer: Self-pay | Admitting: Physical Medicine and Rehabilitation

## 2023-12-20 ENCOUNTER — Telehealth: Payer: Self-pay

## 2023-12-20 ENCOUNTER — Ambulatory Visit: Admitting: Primary Care

## 2023-12-20 ENCOUNTER — Encounter: Payer: Self-pay | Admitting: Primary Care

## 2023-12-20 VITALS — BP 134/70 | HR 85 | Temp 97.9°F | Ht 63.0 in | Wt 135.0 lb

## 2023-12-20 DIAGNOSIS — Z0000 Encounter for general adult medical examination without abnormal findings: Secondary | ICD-10-CM

## 2023-12-20 DIAGNOSIS — I10 Essential (primary) hypertension: Secondary | ICD-10-CM

## 2023-12-20 DIAGNOSIS — S069XAS Unspecified intracranial injury with loss of consciousness status unknown, sequela: Secondary | ICD-10-CM | POA: Diagnosis not present

## 2023-12-20 DIAGNOSIS — G8929 Other chronic pain: Secondary | ICD-10-CM

## 2023-12-20 DIAGNOSIS — J4540 Moderate persistent asthma, uncomplicated: Secondary | ICD-10-CM

## 2023-12-20 DIAGNOSIS — E538 Deficiency of other specified B group vitamins: Secondary | ICD-10-CM

## 2023-12-20 DIAGNOSIS — R519 Headache, unspecified: Secondary | ICD-10-CM | POA: Diagnosis not present

## 2023-12-20 DIAGNOSIS — E2839 Other primary ovarian failure: Secondary | ICD-10-CM

## 2023-12-20 DIAGNOSIS — K219 Gastro-esophageal reflux disease without esophagitis: Secondary | ICD-10-CM

## 2023-12-20 DIAGNOSIS — N1831 Chronic kidney disease, stage 3a: Secondary | ICD-10-CM | POA: Diagnosis not present

## 2023-12-20 DIAGNOSIS — R7303 Prediabetes: Secondary | ICD-10-CM

## 2023-12-20 DIAGNOSIS — E039 Hypothyroidism, unspecified: Secondary | ICD-10-CM

## 2023-12-20 LAB — LIPID PANEL
Cholesterol: 171 mg/dL (ref 0–200)
HDL: 83.6 mg/dL (ref >39.00)
LDL Cholesterol: 70 mg/dL (ref 0–99)
NonHDL: 87.54
Total CHOL/HDL Ratio: 2
Triglycerides: 89 mg/dL (ref 0.0–149.0)
VLDL: 17.8 mg/dL (ref 0.0–40.0)

## 2023-12-20 LAB — POCT GLYCOSYLATED HEMOGLOBIN (HGB A1C): Hemoglobin A1C: 5.5 % (ref 4.0–5.6)

## 2023-12-20 LAB — VITAMIN B12: Vitamin B-12: 718 pg/mL (ref 211–911)

## 2023-12-20 MED ORDER — IRBESARTAN 75 MG PO TABS: 37.5000 mg | ORAL_TABLET | Freq: Every day | ORAL | 3 refills | Status: DC

## 2023-12-20 NOTE — Assessment & Plan Note (Signed)
 Repeat vitamin B12 level pending. Continue B12 injections every 3 weeks.

## 2023-12-20 NOTE — Assessment & Plan Note (Signed)
 Stable compared to prior labs. Reviewed renal function from July 2025

## 2023-12-20 NOTE — Patient Instructions (Addendum)
 Stop by the lab prior to leaving today. I will notify you of your results once received.   Stop taking olmesartan  for blood pressure. Start irbesartan  for blood pressure.  Take 1/2 tablet by mouth once daily for blood pressure.  Monitor your blood pressure as discussed.  It was a pleasure to see you today!

## 2023-12-20 NOTE — Assessment & Plan Note (Signed)
 Controlled.  Following with endocrinology, office notes and labs reviewed from July 2025. Continue levothyroxine  100 mcg 6 days weekly. Continue levothyroxine  112 mcg 1 day weekly.

## 2023-12-20 NOTE — Assessment & Plan Note (Signed)
 Controlled.  Continue pantoprazole 40 mg daily.

## 2023-12-20 NOTE — Assessment & Plan Note (Addendum)
 Controlled.  Continue amlodipine  5 mg daily.  She would like to switch from olmesartan  20 mg to irbestartan 37.5 mg. Irbesartan  was provided to her during her hospital stay in May 2025 which made her feel better than olmesartan .  Start irbesartan  37.5 mg daily. We discussed that this is a low dose.  She will monitor her BP at home.  CMP reviewed from July 2025.

## 2023-12-20 NOTE — Assessment & Plan Note (Signed)
 A1C today within normal range!

## 2023-12-20 NOTE — Assessment & Plan Note (Signed)
 Following with psychiatry and psychology.  Continue Adderall 20 mg TID. Continue diazepam  5 mg PRN

## 2023-12-20 NOTE — Assessment & Plan Note (Signed)
 Discussed second Shingrix  vaccine.  Mammogram UTD Colonoscopy UTD, due 2028  Discussed the importance of a healthy diet and regular exercise in order for weight loss, and to reduce the risk of further co-morbidity.  Exam stable. Labs pending.  Follow up in 1 year for repeat physical.

## 2023-12-20 NOTE — Progress Notes (Signed)
 Subjective:    Patient ID: Katie Robbins, female    DOB: 26-May-1955, 68 y.o.   MRN: [id number]  HPI  Katie Robbins is a very pleasant 68 y.o. female who presents today for complete physical and follow up of chronic conditions.  Immunizations: -Tetanus: Completed in 2016  -Shingles: Completed Shingrix  dose x 1  -Pneumonia: Completed Pneumovax 23 in 2016  Diet: Fair diet.  Exercise: No regular exercise.  Eye exam: Completes annually  Dental exam: Completes semi-annually    Mammogram: Completed in December 2024 and January 2025 Bone Density Scan: Completed in June 2025  Colonoscopy: Completed in 2021, due 2028  BP Readings from Last 3 Encounters:  12/20/23 134/70  12/11/23 (!) 145/82  11/22/23 124/80   Wt Readings from Last 3 Encounters:  12/20/23 135 lb (61.2 kg)  12/11/23 134 lb (60.8 kg)  11/22/23 133 lb (60.3 kg)        Review of Systems  Constitutional:  Negative for unexpected weight change.  HENT:  Negative for rhinorrhea.   Respiratory:  Negative for cough and shortness of breath.   Cardiovascular:  Negative for chest pain.  Gastrointestinal:  Negative for constipation and diarrhea.  Genitourinary:  Negative for difficulty urinating.  Musculoskeletal:  Negative for arthralgias.  Skin:  Negative for rash.  Allergic/Immunologic: Negative for environmental allergies.  Neurological:  Positive for headaches. Negative for dizziness.  Psychiatric/Behavioral:  The patient is not nervous/anxious.          Past Medical History:  Diagnosis Date   Acute thoracic back pain 07/22/2018   ADHD (attention deficit hyperactivity disorder)    Anemia    Abnormal Uterine Bleeding - Resulted in Hysterectomy   Anxiety    Arthritis    Asthma    Chicken pox    Chronic headaches    Chronic kidney disease    CKD Stage 3a. No HD   Cognitive disorder    Concussion 07/2021   Diverticulitis    Electrocution 04/17/2022   Essential hypertension    GERD  (gastroesophageal reflux disease)    Guillain Barr syndrome (HCC) 1982   H/O breast implant    Heart murmur    Hypertension    Hypothyroidism    Interstitial cystitis    Interstitial cystitis    Mitral prolapse 1985   Non-cardiac chest pain 06/13/2020   Nutcracker esophagus    Osteoarthritis    PONV (postoperative nausea and vomiting)    patient states that she does well with phenergan    Soft tissue mass 03/31/2020   Stroke (HCC)    Multiple TIAs. Last one Feb. 2024    Social History   Socioeconomic History   Marital status: Married    Spouse name: Reyes   Number of children: 2   Years of education: Some college   Highest education level: Some college, no degree  Occupational History   Occupation: Retired  Tobacco Use   Smoking status: Never   Smokeless tobacco: Never  Vaping Use   Vaping status: Never Used  Substance and Sexual Activity   Alcohol  use: Yes    Alcohol /week: 3.0 standard drinks of alcohol     Types: 2 Cans of beer, 1 Shots of liquor per week    Comment: occasional   Drug use: No   Sexual activity: Yes    Birth control/protection: Surgical  Other Topics Concern   Not on file  Social History Narrative   Lives with husband   Caffeine use: rare   Right handed  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  4 years active-2 years inactive   Social Drivers of Corporate investment banker Strain: Low Risk  (12/16/2023)   Overall Financial Resource Strain (CARDIA)    Difficulty of Paying Living Expenses: Not hard at all  Food Insecurity: No Food Insecurity (12/16/2023)   Hunger Vital Sign    Worried About Running Out of Food in the Last Year: Never true    Ran Out of Food in the Last Year: Never true  Transportation Needs: No Transportation Needs (12/16/2023)   PRAPARE - Administrator, Civil Service (Medical): No    Lack of Transportation (Non-Medical): No  Physical Activity: Inactive (12/16/2023)   Exercise Vital Sign    Days of Exercise per Week: 0 days    Minutes  of Exercise per Session: Not on file  Stress: Stress Concern Present (12/16/2023)   [employer] of Occupational Health - Occupational Stress Questionnaire    Feeling of Stress: To some extent  Social Connections: Socially Isolated (12/16/2023)   Social Connection and Isolation Panel    Frequency of Communication with Friends and Family: Once a week    Frequency of Social Gatherings with Friends and Family: Once a week    Attends Religious Services: Never    Database administrator or Organizations: No    Attends Engineer, structural: Not on file    Marital Status: Married  Catering manager Violence: Not At Risk (09/20/2023)   Humiliation, Afraid, Rape, and Kick questionnaire    Fear of Current or Ex-Partner: No    Emotionally Abused: No    Physically Abused: No    Sexually Abused: No    Past Surgical History:  Procedure Laterality Date   ABDOMINAL HYSTERECTOMY  2000/2009   ANTERIOR CERVICAL DECOMP/DISCECTOMY FUSION N/A 09/20/2023   Procedure: ANTERIOR CERVICAL DECOMPRESSION/DISCECTOMY FUSION CERVICAL FOUR-FIVE;  Surgeon: Onetha Kuba, MD;  Location: [facility] OR;  Service: Neurosurgery;  Laterality: N/A;  ACDF C45 3C   APPENDECTOMY  2007   AUGMENTATION MAMMAPLASTY     BELPHAROPTOSIS REPAIR Bilateral    Cosmetic. Eye Lift   Bilateral Cervical Two ganglionectomies  08/2022   BOWEL RESECTION  2007   CARDIAC CATHETERIZATION  2016   Clean   CARPAL TUNNEL RELEASE Bilateral    CHOLECYSTECTOMY  2012   COLONOSCOPY WITH PROPOFOL  N/A 04/13/2020   Procedure: COLONOSCOPY WITH PROPOFOL ;  Surgeon: Unk Corinn Skiff, MD;  Location[facility]COPY;  Service: Gastroenterology;  Laterality: N/A;   ESOPHAGOGASTRODUODENOSCOPY (EGD) WITH PROPOFOL  N/A 04/13/2020   Procedure: ESOPHAGOGASTRODUODENOSCOPY (EGD) WITH PROPOFOL ;  Surgeon: Unk Corinn Skiff, MD;  Location: [facility] ENDOSCOPY;  Service: Gastroenterology;  Laterality: N/A;   ESOPHAGOGASTRODUODENOSCOPY (EGD) WITH PROPOFOL  N/A 06/06/2023    Procedure: ESOPHAGOGASTRODUODENOSCOPY (EGD) WITH PROPOFOL ;  Surgeon: Unk Corinn Skiff, MD;  Location: [facility] ENDOSCOPY;  Service: Gastroenterology;  Laterality: N/A;   NECK SURGERY  06/11/2016   Fusion C3-C7 Dr. Onetha   POSTERIOR CERVICAL LAMINECTOMY Bilateral 08/16/2022   Procedure: Bilateral Cervical Two ganglionectomies;  Surgeon: Cheryle Debby LABOR, MD;  Location: [facility] OR;  Service: Neurosurgery;  Laterality: Bilateral;   SMALL INTESTINE SURGERY  2007   Bowel resection   TONSILLECTOMY     VESICO-VAGINAL FISTULA REPAIR  2007    Family History  Problem Relation Age of Onset   Hypertension Mother    GER disease Mother    Polymyalgia rheumatica Mother    COPD Mother    Osteoarthritis Mother    Osteoporosis Mother    Other Mother  Giant Cell Arteritis    GER disease Father    Heart attack Father    Spondylolysis Father     Allergies  Allergen Reactions   Bactrim [Sulfamethoxazole-Trimethoprim] Hives   Macrobid [Nitrofurantoin Macrocrystal] Hives   Tizanidine Other (See Comments)    Bad Mood change Migraines   Vicodin [Hydrocodone-Acetaminophen ] Swelling    Puffy eyes and face   Trileptal  [Oxcarbazepine ] Rash   Abilify [Aripiprazole] Other (See Comments)    Bad leg cramps   Acetaminophen      Other reaction(s): Puffy face/eyes Patient states that she is able to take acetaminophen    Amaryl [Glimepiride] Other (See Comments)    Not effective   Ambien  [Zolpidem ] Other (See Comments)    Did not work   Cyclobenzaprine  Other (See Comments)    Not effective   Erythromycin Base     Other Reaction(s): Rash   Gabapentin Other (See Comments)    XR makes her Groggy   Hydrocodone Swelling    Eyes and face   Klonopin [Clonazepam] Other (See Comments)    Make her feel groggy   Nexium [Esomeprazole Magnesium] Nausea And Vomiting    Face swelling, hives    Nortriptyline     insomnia   Olanzapine Other (See Comments)    Not effective   Pregabalin Other (See Comments)     Not effective   Prilosec [Omeprazole] Nausea And Vomiting    Face swelling, hives    Quetiapine Other (See Comments)    Groggy   Reglan  [Metoclopramide ] Nausea Only   Savella [Milnacipran] Other (See Comments)    Not effective   Sulfa Antibiotics Hives   Tamoxifen    Tapentadol Other (See Comments)    Not effective   Topiramate Other (See Comments)    Not effective   Trimethoprim     Other reaction(s): Hives   Wasp Venom Hives    swelling   Zoloft  [Sertraline ] Other (See Comments)    Not effective   Erythromycin Rash   Lamotrigine  Rash    Bad mood swings   Latex Rash   Molds & Smuts Anxiety    Black Mold   Tramadol Nausea Only    Not effective    Current Outpatient Medications on File Prior to Visit  Medication Sig Dispense Refill   albuterol  (VENTOLIN  HFA) 108 (90 Base) MCG/ACT inhaler Inhale 2 puffs into the lungs every 6 (six) hours as needed for wheezing or shortness of breath. 8.5 g 5   Alum & Mag Hydroxide-Simeth (GI COCKTAIL) SUSP suspension Take 20 mLs by mouth 2 (two) times daily. Maalox 30 mL, lidocaine  5 mL and dicyclomine 10 mL, dispense 450 mL,  SIG take 15 to 20 mL as needed 3-4 times daily. 1200 mL 1   amLODipine  (NORVASC ) 5 MG tablet TAKE ONE TABLET BY MOUTH ONCE DAILY FOR BLOOD PRESSURE 90 tablet 0   amphetamine -dextroamphetamine  (ADDERALL) 20 MG tablet Take 20 mg by mouth See admin instructions. Take 1 tablet (20 mg) by mouth scheduled in the morning & in the afternoon, may take an additional dose in the evening, if needed for focus/attention.     aspirin  EC 81 MG tablet Take 1 tablet (81 mg total) by mouth in the morning. 30 tablet 12   budesonide -formoterol  (SYMBICORT ) 160-4.5 MCG/ACT inhaler Inhale 2 puffs into the lungs 2 (two) times daily. 10.2 g 5   carboxymethylcellulose (REFRESH PLUS) 0.5 % SOLN Place 1-2 drops into both eyes 3 (three) times daily as needed (dry/irritated eyes.).  cyanocobalamin  (VITAMIN B12) 1000 MCG/ML injection INJECT ONE VIAL  (1 ML) INTO THE MUSCLE EVERY THREE TO FOUR WEEKS 12 mL 0   diazepam  (VALIUM ) 5 MG tablet Take 5 mg by mouth 4 (four) times daily as needed for anxiety.     EPINEPHrine  0.3 mg/0.3 mL IJ SOAJ injection Inject 0.3 mg into the muscle as needed for anaphylaxis. 1 each 0   estradiol  (ESTRACE ) 0.1 MG/GM vaginal cream Place 0.25 Applicatorfuls vaginally at bedtime. 90 g 3   estradiol  (ESTRACE ) 1 MG tablet Take 1 tablet (1 mg total) by mouth daily. 30 tablet 11   levothyroxine  (SYNTHROID ) 100 MCG tablet Take 1 tablet (100 mcg total) by mouth as directed. 1 tablet Monday through Saturday 72 tablet 3   levothyroxine  (SYNTHROID ) 112 MCG tablet Take 1 tablet (112 mcg total) by mouth as directed. Every Sunday only 13 tablet 3   pantoprazole  (PROTONIX ) 40 MG tablet TAKE ONE TABLET (40 MG TOTAL) BY MOUTH DAILY. FOR HEARTBURN. 90 tablet 3   traMADol (ULTRAM) 50 MG tablet Take 50 mg by mouth every 6 (six) hours as needed.     No current facility-administered medications on file prior to visit.    BP 134/70   Pulse 85   Temp 97.9 F (36.6 C) (Temporal)   Ht 5' 3 (1.6 m)   Wt 135 lb (61.2 kg)   SpO2 99%   BMI 23.91 kg/m  Objective:   Physical Exam HENT:     Right Ear: Tympanic membrane and ear canal normal.     Left Ear: Tympanic membrane and ear canal normal.  Eyes:     Pupils: Pupils are equal, round, and reactive to light.  Cardiovascular:     Rate and Rhythm: Normal rate and regular rhythm.  Pulmonary:     Effort: Pulmonary effort is normal.     Breath sounds: Normal breath sounds.  Abdominal:     General: Bowel sounds are normal.     Palpations: Abdomen is soft.     Tenderness: There is no abdominal tenderness.  Musculoskeletal:        General: Normal range of motion.     Cervical back: Neck supple.  Skin:    General: Skin is warm and dry.  Neurological:     Mental Status: She is alert and oriented to person, place, and time.     Cranial Nerves: No cranial nerve deficit.     Deep  Tendon Reflexes:     Reflex Scores:      Patellar reflexes are 2+ on the right side and 2+ on the left side. Psychiatric:        Mood and Affect: Mood normal.           Assessment & Plan:  Preventative health care Assessment & Plan: Discussed second Shingrix  vaccine.  Mammogram UTD Colonoscopy UTD, due 2028  Discussed the importance of a healthy diet and regular exercise in order for weight loss, and to reduce the risk of further co-morbidity.  Exam stable. Labs pending.  Follow up in 1 year for repeat physical.    Essential hypertension Assessment & Plan: Controlled.  Continue amlodipine  5 mg daily.  She would like to switch from olmesartan  20 mg to irbestartan 37.5 mg. Irbesartan  was provided to her during her hospital stay in May 2025 which made her feel better than olmesartan .  Start irbesartan  37.5 mg daily. We discussed that this is a low dose.  She will monitor her BP at home.  CMP  reviewed from July 2025.  Orders: -     Irbesartan ; Take 0.5 tablets (37.5 mg total) by mouth daily. for blood pressure.  Dispense: 45 tablet; Refill: 3 -     Lipid panel  Moderate persistent asthma without complication Assessment & Plan: Stable.  Following with pulmonology, office notes reviewed from July 2025.  Continue Symbicort  160-4.5 mcg, 2 puffs BID. Continue albuterol  inhaler PRN.   Gastroesophageal reflux disease, unspecified whether esophagitis present Assessment & Plan: Controlled.  Continue pantoprazole  40 mg daily.    Hypothyroidism, unspecified type Assessment & Plan: Controlled.  Following with endocrinology, office notes and labs reviewed from July 2025. Continue levothyroxine  100 mcg 6 days weekly. Continue levothyroxine  112 mcg 1 day weekly.   Chronic nonintractable headache, unspecified headache type Assessment & Plan: Deteriorated.  Follow up with neurology as scheduled.    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  Continue Adderall 20 mg twice to three times daily, diazepam  5 mg PRN.   Stage 3a chronic kidney disease (HCC) Assessment & Plan: Stable compared to prior labs. Reviewed renal function from July 2025   Mood disorder as late effect of traumatic brain injury [facility]) Assessment & Plan: Following with psychiatry and psychology.  Continue Adderall 20 mg TID. Continue diazepam  5 mg PRN   Prediabetes Assessment & Plan: A1C today within normal range!    Orders: -     POCT glycosylated hemoglobin (Hb A1C)  Vitamin B 12 deficiency Assessment & Plan: Repeat vitamin B12 level pending. Continue B12 injections every 3 weeks.   Orders: -     Vitamin B12  Estrogen deficiency Assessment & Plan: Stable.  Continue estradiol  1 mg daily per GYN         Katie Staff K Connery Shiffler, NP

## 2023-12-20 NOTE — Assessment & Plan Note (Signed)
 Following with psychiatry and psychology.  Continue Adderall 20 mg twice to three times daily, diazepam  5 mg PRN.

## 2023-12-20 NOTE — Assessment & Plan Note (Signed)
 Stable.  Continue estradiol  1 mg daily per GYN

## 2023-12-20 NOTE — Telephone Encounter (Signed)
 Copied from CRM [id number]. Topic: Clinical - Medication Question >> Dec 20, 2023 12:32 PM Rea BROCKS wrote: Reason for CRM: [place] from [facility] needs confirmation on whether NP Gretta wants patient to continue or discontinue with omelsartan 25 mg prescribed by patients cardiologist, since they received the script for irbesartan  (AVAPRO ) 75 MG tablet.

## 2023-12-20 NOTE — Assessment & Plan Note (Signed)
 Stable.  Following with pulmonology, office notes reviewed from July 2025.  Continue Symbicort  160-4.5 mcg, 2 puffs BID. Continue albuterol  inhaler PRN.

## 2023-12-20 NOTE — Telephone Encounter (Signed)
 Please discontinue olmesartan .

## 2023-12-20 NOTE — Assessment & Plan Note (Signed)
 Deteriorated.  Follow up with neurology as scheduled.

## 2023-12-22 ENCOUNTER — Ambulatory Visit: Payer: Self-pay | Admitting: Primary Care

## 2023-12-23 ENCOUNTER — Encounter: Payer: Self-pay | Admitting: Internal Medicine

## 2023-12-23 NOTE — Telephone Encounter (Signed)
 [facility] pharmacy notified.

## 2023-12-30 ENCOUNTER — Telehealth: Payer: Self-pay | Admitting: Neurology

## 2023-12-30 ENCOUNTER — Ambulatory Visit: Admitting: Neurology

## 2023-12-30 ENCOUNTER — Encounter: Payer: Self-pay | Admitting: Neurology

## 2023-12-30 VITALS — BP 166/76 | HR 89 | Resp 17 | Ht 63.0 in | Wt 134.5 lb

## 2023-12-30 DIAGNOSIS — G459 Transient cerebral ischemic attack, unspecified: Secondary | ICD-10-CM | POA: Diagnosis not present

## 2023-12-30 DIAGNOSIS — M542 Cervicalgia: Secondary | ICD-10-CM

## 2023-12-30 DIAGNOSIS — G8929 Other chronic pain: Secondary | ICD-10-CM

## 2023-12-30 MED ORDER — DULOXETINE HCL 20 MG PO CPEP: 20.0000 mg | ORAL_CAPSULE | Freq: Every day | ORAL | 3 refills | Status: DC

## 2023-12-30 NOTE — Telephone Encounter (Signed)
 no auth required sent to GI [phone]
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
## 2023-12-31 ENCOUNTER — Ambulatory Visit: Payer: Self-pay | Admitting: Neurology

## 2023-12-31 LAB — CREATININE, SERUM
Creatinine, Ser: 1.05 mg/dL — ABNORMAL HIGH (ref 0.57–1.00)
eGFR: 58 mL/min/1.73 — ABNORMAL LOW (ref >59)

## 2023-12-31 LAB — CK: Total CK: 54 U/L (ref 32–182)

## 2023-12-31 LAB — ANA W/REFLEX IF POSITIVE: Anti Nuclear Antibody (ANA): NEGATIVE

## 2023-12-31 LAB — C-REACTIVE PROTEIN: CRP: 7 mg/L (ref 0–10)

## 2023-12-31 LAB — RHEUMATOID FACTOR: Rheumatoid fact SerPl-aCnc: 11.2 [IU]/mL (ref <14.0)

## 2023-12-31 LAB — SEDIMENTATION RATE: Sed Rate: 4 mm/h (ref 0–40)

## 2024-01-03 ENCOUNTER — Ambulatory Visit (INDEPENDENT_AMBULATORY_CARE_PROVIDER_SITE_OTHER)

## 2024-01-03 VITALS — Ht 63.0 in | Wt 134.0 lb

## 2024-01-03 DIAGNOSIS — Z0000 Encounter for general adult medical examination without abnormal findings: Secondary | ICD-10-CM | POA: Diagnosis not present

## 2024-01-03 NOTE — Patient Instructions (Addendum)
 Ms. Currier , Thank you for taking time out of your busy schedule to complete your Annual Wellness Visit with me. I enjoyed our conversation and look forward to speaking with you again next year. I, as well as your care team,  appreciate your ongoing commitment to your health goals. Please review the following plan we discussed and let me know if I can assist you in the future. Your Game plan/ To Do List    Referrals: If you haven't heard from the office you've been referred to, please reach out to them at the phone provided.   Follow up Visits: We will see or speak with you next year for your Next Medicare AWV with our clinical staff-01/06/25 @ 1:40pm televisit Have you seen your provider in the last 6 months (3 months if uncontrolled diabetes)? Yes  Clinician Recommendations:  Aim for 30 minutes of exercise or brisk walking, 6-8 glasses of water, and 5 servings of fruits and vegetables each day.       This is a list of the screenings recommended for you:  Health Maintenance  Topic Date Due   Pneumococcal Vaccine for age over 78 (2 of 2 - PCV) 12/09/2015   Zoster (Shingles) Vaccine (2 of 2) 01/30/2019   COVID-19 Vaccine (3 - Moderna risk series) 01/05/2024*   Flu Shot  08/11/2024*   Medicare Annual Wellness Visit  01/02/2025   DTaP/Tdap/Td vaccine (2 - Tdap) 02/13/2025   Mammogram  05/16/2025   Colon Cancer Screening  04/14/2027   DEXA scan (bone density measurement)  Completed   Hepatitis C Screening  Completed   HPV Vaccine  Aged Out   Meningitis B Vaccine  Aged Out  *Topic was postponed. The date shown is not the original due date.    Advanced directives: (In Chart) A copy of your advanced directives are scanned into your chart should your provider ever need it. Advance Care Planning is important because it:  [x]  Makes sure you receive the medical care that is consistent with your values, goals, and preferences  [x]  It provides guidance to your family and loved ones and reduces  their decisional burden about whether or not they are making the right decisions based on your wishes.  Follow the link provided in your after visit summary or read over the paperwork we have mailed to you to help you started getting your Advance Directives in place. If you need assistance in completing these, please reach out to us  so that we can help you!

## 2024-01-03 NOTE — Progress Notes (Signed)
 Please attest and cosign this visit due to patients primary care provider not being in the office at the time the visit was completed.    Subjective:   Katie Robbins is a 68 y.o. who presents for a Medicare Wellness preventive visit.  As a reminder, Annual Wellness Visits don't include a physical exam, and some assessments may be limited, especially if this visit is performed virtually. We may recommend an in-person follow-up visit with your provider if needed.  Visit Complete: Virtual I connected with  Katie Robbins on 01/03/24 by a audio enabled telemedicine application and verified that I am speaking with the correct person using two identifiers.  Patient Location: Home  Provider Location: Office/Clinic  I discussed the limitations of evaluation and management by telemedicine. The patient expressed understanding and agreed to proceed.  Vital Signs: Because this visit was a virtual/telehealth visit, some criteria may be missing or patient reported. Any vitals not documented were not able to be obtained and vitals that have been documented are patient reported.  VideoDeclined- This patient declined Librarian, academic. Therefore the visit was completed with audio only.  Persons Participating in Visit: Patient.  AWV Questionnaire: No: Patient Medicare AWV questionnaire was not completed prior to this visit.  Cardiac Risk Factors include: advanced age (>30men, >70 women);hypertension;sedentary lifestyle     Objective:    Today's Vitals   01/03/24 1344  Weight: 134 lb (60.8 kg)  Height: 5' 3 (1.6 m)  PainSc: 8    Body mass index is 23.74 kg/m.     01/03/2024    2:01 PM 09/20/2023    6:32 PM 09/20/2023    6:12 PM 09/17/2023    1:39 PM 06/06/2023    7:25 AM 08/15/2022    2:17 PM 04/13/2022    5:55 PM  Advanced Directives  Does Patient Have a Medical Advance Directive? Yes  No Yes Yes Yes No  Type of Estate agent of  [place];Living will  Healthcare Power of [employer] of [place];Living will Healthcare Power of [place];Living will Healthcare Power of [place];Living will   Does patient want to make changes to medical advance directive?   No - Patient declined No - Patient declined     Copy of Healthcare Power of Attorney in Chart? Yes - validated most recent copy scanned in chart (See row information)   Yes - validated most recent copy scanned in chart (See row information)  No - copy requested   Would patient like information on creating a medical advance directive?  No - Patient declined         Current Medications (verified) Outpatient Encounter Medications as of 01/03/2024  Medication Sig   albuterol  (VENTOLIN  HFA) 108 (90 Base) MCG/ACT inhaler Inhale 2 puffs into the lungs every 6 (six) hours as needed for wheezing or shortness of breath.   Alum & Mag Hydroxide-Simeth (GI COCKTAIL) SUSP suspension Take 20 mLs by mouth 2 (two) times daily. Maalox 30 mL, lidocaine  5 mL and dicyclomine 10 mL, dispense 450 mL,  SIG take 15 to 20 mL as needed 3-4 times daily.   amLODipine  (NORVASC ) 5 MG tablet TAKE ONE TABLET BY MOUTH ONCE DAILY FOR BLOOD PRESSURE   amphetamine -dextroamphetamine  (ADDERALL) 20 MG tablet Take 20 mg by mouth See admin instructions. Take 1 tablet (20 mg) by mouth scheduled in the morning & in the afternoon, may take an additional dose in the evening, if needed for focus/attention.   aspirin  EC 81  MG tablet Take 1 tablet (81 mg total) by mouth in the morning.   budesonide -formoterol  (SYMBICORT ) 160-4.5 MCG/ACT inhaler Inhale 2 puffs into the lungs 2 (two) times daily.   carboxymethylcellulose (REFRESH PLUS) 0.5 % SOLN Place 1-2 drops into both eyes 3 (three) times daily as needed (dry/irritated eyes.).   cyanocobalamin  (VITAMIN B12) 1000 MCG/ML injection INJECT ONE VIAL (1 ML) INTO THE MUSCLE EVERY THREE TO FOUR WEEKS   diazepam  (VALIUM ) 5 MG tablet Take 5 mg by mouth 4 (four) times  daily as needed for anxiety.   DULoxetine  (CYMBALTA ) 20 MG capsule Take 1 capsule (20 mg total) by mouth daily.   EPINEPHrine  0.3 mg/0.3 mL IJ SOAJ injection Inject 0.3 mg into the muscle as needed for anaphylaxis.   estradiol  (ESTRACE ) 0.1 MG/GM vaginal cream Place 0.25 Applicatorfuls vaginally at bedtime.   estradiol  (ESTRACE ) 1 MG tablet Take 1 tablet (1 mg total) by mouth daily.   irbesartan  (AVAPRO ) 75 MG tablet Take 0.5 tablets (37.5 mg total) by mouth daily. for blood pressure.   levothyroxine  (SYNTHROID ) 100 MCG tablet Take 1 tablet (100 mcg total) by mouth as directed. 1 tablet Monday through Saturday   levothyroxine  (SYNTHROID ) 112 MCG tablet Take 1 tablet (112 mcg total) by mouth as directed. Every Sunday only   pantoprazole  (PROTONIX ) 40 MG tablet TAKE ONE TABLET (40 MG TOTAL) BY MOUTH DAILY. FOR HEARTBURN.   traMADol (ULTRAM) 50 MG tablet Take 50 mg by mouth every 6 (six) hours as needed.   No facility-administered encounter medications on file as of 01/03/2024.    Allergies (verified) Bactrim [sulfamethoxazole-trimethoprim], Macrobid [nitrofurantoin macrocrystal], Tizanidine, Vicodin [hydrocodone-acetaminophen ], Trileptal  [oxcarbazepine ], Abilify [aripiprazole], Acetaminophen , Amaryl [glimepiride], Ambien  [zolpidem ], Cyclobenzaprine , Erythromycin base, Gabapentin, Hydrocodone, Klonopin [clonazepam], Nexium [esomeprazole magnesium], Nortriptyline, Olanzapine, Pregabalin, Prilosec [omeprazole], Quetiapine, Reglan  [metoclopramide ], Savella [milnacipran], Sulfa antibiotics, Tamoxifen, Tapentadol, Topiramate, Trimethoprim, Wasp venom, Zoloft  [sertraline ], Erythromycin, Lamotrigine , Latex, Molds & smuts, and Tramadol   History: Past Medical History:  Diagnosis Date   Acute thoracic back pain 07/22/2018   ADHD (attention deficit hyperactivity disorder)    Allergy    Anemia    Abnormal Uterine Bleeding - Resulted in Hysterectomy   Anxiety    Arthritis    Asthma    Blood transfusion  without reported diagnosis 09/16/23   Dr Onetha   Chicken pox    Chronic headaches    Chronic kidney disease    CKD Stage 3a. No HD   Cognitive disorder    Concussion 07/2021   Depression    Diverticulitis    Electrocution 04/17/2022   Essential hypertension    GERD (gastroesophageal reflux disease)    Guillain Barr syndrome (HCC) 1982   H/O breast implant    Heart murmur    Hypertension    Hypothyroidism    Interstitial cystitis    Interstitial cystitis    Mitral prolapse 1985   Myocardial infarction [facility])    Non-cardiac chest pain 06/13/2020   Nutcracker esophagus    Osteoarthritis    PONV (postoperative nausea and vomiting)    patient states that she does well with phenergan    Soft tissue mass 03/31/2020   Stroke (HCC)    Multiple TIAs. Last one Feb. 2024   Past Surgical History:  Procedure Laterality Date   ABDOMINAL HYSTERECTOMY  2000/2009   ANTERIOR CERVICAL DECOMP/DISCECTOMY FUSION N/A 09/20/2023   Procedure: ANTERIOR CERVICAL DECOMPRESSION/DISCECTOMY FUSION CERVICAL FOUR-FIVE;  Surgeon: Onetha Kuba, MD;  Location: [facility] OR;  Service: Neurosurgery;  Laterality: N/A;  ACDF C45 3C  APPENDECTOMY  2007   AUGMENTATION MAMMAPLASTY     BELPHAROPTOSIS REPAIR Bilateral    Cosmetic. Eye Lift   Bilateral Cervical Two ganglionectomies  08/2022   BOWEL RESECTION  2007   CARDIAC CATHETERIZATION  2016   Clean   CARPAL TUNNEL RELEASE Bilateral    CHOLECYSTECTOMY  2012   COLON SURGERY  2007 abcess n fiscula   Bowel resection 13   COLONOSCOPY WITH PROPOFOL  N/A 04/13/2020   Procedure: COLONOSCOPY WITH PROPOFOL ;  Surgeon: Unk Corinn Skiff, MD;  Location: [facility] ENDOSCOPY;  Service: Gastroenterology;  Laterality: N/A;   COSMETIC SURGERY  2007   Breast augmentation   ESOPHAGOGASTRODUODENOSCOPY (EGD) WITH PROPOFOL  N/A 04/13/2020   Procedure: ESOPHAGOGASTRODUODENOSCOPY (EGD) WITH PROPOFOL ;  Surgeon: Unk Corinn Skiff, MD;  Location: [facility] ENDOSCOPY;  Service: Gastroenterology;   Laterality: N/A;   ESOPHAGOGASTRODUODENOSCOPY (EGD) WITH PROPOFOL  N/A 06/06/2023   Procedure: ESOPHAGOGASTRODUODENOSCOPY (EGD) WITH PROPOFOL ;  Surgeon: Unk Corinn Skiff, MD;  Location: [facility] ENDOSCOPY;  Service: Gastroenterology;  Laterality: N/A;   EYE SURGERY  2007 uppers n lowers   Eyelid surgery both   NECK SURGERY  06/11/2016   Fusion C3-C7 Dr. Onetha   POSTERIOR CERVICAL LAMINECTOMY Bilateral 08/16/2022   Procedure: Bilateral Cervical Two ganglionectomies;  Surgeon: Cheryle Debby LABOR, MD;  Location[facility] OR;  Service: Neurosurgery;  Laterality: Bilateral;   SMALL INTESTINE SURGERY  2007   Bowel resection   SPINE SURGERY  2018 Jan cervical   Fusion C-3-7   TONSILLECTOMY     TUBAL LIGATION  2009   VESICO-VAGINAL FISTULA REPAIR  2007   Family History  Problem Relation Age of Onset   Hypertension Mother    GER disease Mother    Polymyalgia rheumatica Mother    COPD Mother    Osteoarthritis Mother    Osteoporosis Mother    Other Mother        Giant Cell Arteritis    Arthritis Mother    Asthma Mother    GER disease Father    Heart attack Father    Spondylolysis Father    Hearing loss Father    Hypertension Father    Hearing loss Brother    Social History   Socioeconomic History   Marital status: Married    Spouse name: Reyes   Number of children: 2   Years of education: Some college   Highest education level: Some college, no degree  Occupational History   Occupation: Retired  Tobacco Use   Smoking status: Never   Smokeless tobacco: Never  Vaping Use   Vaping status: Never Used  Substance and Sexual Activity   Alcohol  use: Yes    Alcohol /week: 3.0 standard drinks of alcohol     Types: 2 Cans of beer, 1 Shots of liquor per week    Comment: occasional   Drug use: No   Sexual activity: Yes    Birth control/protection: Surgical  Other Topics Concern   Not on file  Social History Narrative   Lives with husband   Caffeine use: rare   Right handed   Mar[employer]  '78-82.  4 years active-2 years inactive   Social Drivers of Corporate investment banker Strain: Low Risk  (01/03/2024)   Overall Financial Resource Strain (CARDIA)    Difficulty of Paying Living Expenses: Not hard at all  Food Insecurity: No Food Insecurity (01/03/2024)   Hunger Vital Sign    Worried About Running Out of Food in the Last Year: Never true    Ran Out of Food in  the Last Year: Never true  Transportation Needs: No Transportation Needs (01/03/2024)   PRAPARE - Administrator, Civil Service (Medical): No    Lack of Transportation (Non-Medical): No  Physical Activity: Inactive (01/03/2024)   Exercise Vital Sign    Days of Exercise per Week: 0 days    Minutes of Exercise per Session: 0 min  Stress: No Stress Concern Present (01/03/2024)   [employer] of Occupational Health - Occupational Stress Questionnaire    Feeling of Stress: Only a little  Recent Concern: Stress - Stress Concern Present (12/16/2023)   [employer] of Occupational Health - Occupational Stress Questionnaire    Feeling of Stress: To some extent  Social Connections: Moderately Isolated (01/03/2024)   Social Connection and Isolation Panel    Frequency of Communication with Friends and Family: Once a week    Frequency of Social Gatherings with Friends and Family: Once a week    Attends Religious Services: Never    Database administrator or Organizations: No    Attends Engineer, structural: More than 4 times per year    Marital Status: Married    Tobacco Counseling Counseling given: Not Answered    Clinical Intake:  Pre-visit preparation completed: Yes  Pain : 0-10 Pain Score: 8  Pain Type: Chronic pain Pain Location: Head (at skull base and arthritis in hands) Pain Descriptors / Indicators: Aching Pain Onset: More than a month ago Pain Frequency: Constant Pain Relieving Factors: medications  Pain Relieving Factors: medications  BMI - recorded: 23.74 Nutritional  Status: BMI of 19-24  Normal Nutritional Risks: Nausea/ vomitting/ diarrhea (tramadol on empty stomach) Diabetes: No  Lab Results  Component Value Date   HGBA1C 5.5 12/20/2023   HGBA1C 5.7 12/19/2022   HGBA1C 6.1 12/12/2021     How often do you need to have someone help you when you read instructions, pamphlets, or other written materials from your doctor or pharmacy?: 1 - Never  Interpreter Needed?: No  Comments: lives with husband Information entered by :: B.Sterling Mondo,LPN   Activities of Daily Living     01/03/2024    2:01 PM 09/20/2023    6:12 PM  In your present state of health, do you have any difficulty performing the following activities:  Hearing? 1 0  Vision? 0 0  Difficulty concentrating or making decisions? 1 0  Walking or climbing stairs? 1   Dressing or bathing? 1   Doing errands, shopping? 1 1  Preparing Food and eating ? Y   Using the Toilet? N   In the past six months, have you accidently leaked urine? N   Do you have problems with loss of bowel control? N   Managing your Medications? N   Managing your Finances? Y   Housekeeping or managing your Housekeeping? Y     Patient Care Team: Gretta Comer POUR, NP as PCP - General (Internal Medicine) End, Lonni, MD as PCP - Cardiology (Cardiology) Onetha Kuba, MD as Consulting Physician (Neurosurgery) Pucilowski, Robynn LABOR, MD (Inactive) as Consulting Physician (Psychiatry) Patel, Donika K, DO as Consulting Physician (Neurology) Portia Fireman, OD (Optometry)  I have updated your Care Teams any recent Medical Services you may have received from other providers in the past year.     Assessment:   This is a routine wellness examination for Katie Robbins.  Hearing/Vision screen Hearing Screening - Comments:: Patient says she has some earing difficulties. Will [facility] Vision Screening - Comments:: Pt says her vision  is good w/glasses and readers Dr Portia   Goals Addressed              This Visit's Progress    Patient Stated       I would like to be able to drive by next year     Patient Stated       I would like to feel better and have more energy and read more       Depression Screen     01/03/2024    1:57 PM 12/20/2023   11:37 AM 11/13/2023    9:47 AM 12/19/2022   11:43 AM 04/13/2022   11:13 AM 12/12/2021    3:27 PM 09/21/2020    3:24 PM  PHQ 2/9 Scores  PHQ - 2 Score 2 5 6 5  3    PHQ- 9 Score 8 20 23 23  12       Information is confidential and restricted. Go to Review Flowsheets to unlock data.    Fall Risk     01/03/2024    1:51 PM 12/20/2023   11:37 AM 11/13/2023    9:46 AM 12/19/2022   11:44 AM 12/12/2021    3:29 PM  Fall Risk   Falls in the past year? 1 1 1 1  0  Number falls in past yr: 0 0 1 1 1   Injury with Fall? 0 0 1 1 1   Risk for fall due to : Impaired balance/gait History of fall(s) History of fall(s) History of fall(s) History of fall(s)  Follow up Education provided;Falls prevention discussed Falls evaluation completed Falls evaluation completed Follow up appointment     MEDICARE RISK AT HOME:  Medicare Risk at Home Any stairs in or around the home?: Yes If so, are there any without handrails?: Yes Home free of loose throw rugs in walkways, pet beds, electrical cords, etc?: Yes Adequate lighting in your home to reduce risk of falls?: Yes Life alert?: No Use of a cane, walker or w/c?: Yes Grab bars in the bathroom?: No Shower chair or bench in shower?: No Elevated toilet seat or a handicapped toilet?: Yes  TIMED UP AND GO:  Was the test performed?  No  Cognitive Function: 6CIT completed    03/09/2019   10:28 AM 07/23/2018    1:49 PM 04/14/2018   10:22 AM 10/09/2017    9:19 AM  MMSE - Mini Mental State Exam  Orientation to time 4 3 4 3   Orientation to Place 5 5 5 5   Registration 2 3 2 3   Attention/ Calculation 5 5 5 5   Recall 2 0 1 3  Language- name 2 objects 2 2 2 2   Language- repeat 1 1 1 1   Language- follow 3 step command 2 3 3 3    Language- read & follow direction 1 1 1 1   Write a sentence 1 1 1 1   Copy design 1 1 1 1   Total score 26 25 26 28       12/30/2023   10:00 AM  Montreal Cognitive Assessment   Visuospatial/ Executive (0/5) 5  Naming (0/3) 3  Attention: Read list of digits (0/2) 2  Attention: Read list of letters (0/1) 0  Attention: Serial 7 subtraction starting at 100 (0/3) 3  Language: Repeat phrase (0/2) 1  Language : Fluency (0/1) 0  Abstraction (0/2) 2  Delayed Recall (0/5) 3  Orientation (0/6) 6  Total 25      01/03/2024    2:03 PM  6CIT Screen  What  Year? 0 points  What month? 0 points  What time? 0 points  Count back from 20 0 points  Months in reverse 0 points  Repeat phrase 2 points  Total Score 2 points    Immunizations Immunization History  Administered Date(s) Administered   Influenza-Unspecified 06/26/1980   Moderna Sars-Covid-2 Vaccination 07/31/2019, 09/01/2019   Pneumococcal Polysaccharide-23 12/09/2014   Td 02/14/2015   Zoster Recombinant(Shingrix ) 12/05/2018    Screening Tests Health Maintenance  Topic Date Due   Pneumococcal Vaccine: 50+ Years (2 of 2 - PCV) 12/09/2015   Zoster Vaccines- Shingrix  (2 of 2) 01/30/2019   COVID-19 Vaccine (3 - Moderna risk series) 01/05/2024 (Originally 09/29/2019)   INFLUENZA VACCINE  08/11/2024 (Originally 12/13/2023)   Medicare Annual Wellness (AWV)  01/02/2025   DTaP/Tdap/Td (2 - Tdap) 02/13/2025   MAMMOGRAM  05/16/2025   Colonoscopy  04/14/2027   DEXA SCAN  Completed   Hepatitis C Screening  Completed   HPV VACCINES  Aged Out   Meningococcal B Vaccine  Aged Out    Health Maintenance  Health Maintenance Due  Topic Date Due   Pneumococcal Vaccine: 50+ Years (2 of 2 - PCV) 12/09/2015   Zoster Vaccines- Shingrix  (2 of 2) 01/30/2019   Health Maintenance Items Addressed: None at this time  Additional Screening:  Vision Screening: Recommended annual ophthalmology exams for early detection of glaucoma and other disorders  of the eye. Would you like a referral to an eye doctor? No    Dental Screening: Recommended annual dental exams for proper oral hygiene  Community Resource Referral / Chronic Care Management: CRR required this visit?  No   CCM required this visit?  Appt scheduled with PCP   Plan:    I have personally reviewed and noted the following in the patient's chart:   Medical and social history Use of alcohol , tobacco or illicit drugs  Current medications and supplements including opioid prescriptions. Patient is currently taking opioid prescriptions. Information provided to patient regarding non-opioid alternatives. Patient advised to discuss non-opioid treatment plan with their provider. Functional ability and status Nutritional status Physical activity Advanced directives List of other physicians Hospitalizations, surgeries, and ER visits in previous 12 months Vitals Screenings to include cognitive, depression, and falls Referrals and appointments  In addition, I have reviewed and discussed with patient certain preventive protocols, quality metrics, and best practice recommendations. A written personalized care plan for preventive services as well as general preventive health recommendations were provided to patient.   Erminio LITTIE Saris, LPN   1/77/7974   After Visit Summary: (MyChart) Due to this being a telephonic visit, the after visit summary with patients personalized plan was offered to patient via MyChart   Notes: Nothing significant to report at this time.

## 2024-01-06 ENCOUNTER — Ambulatory Visit
Admission: RE | Admit: 2024-01-06 | Discharge: 2024-01-06 | Disposition: A | Discharge status: Home / Self Care | Source: Ambulatory Visit | Attending: Neurology | Admitting: Neurology

## 2024-01-06 DIAGNOSIS — G459 Transient cerebral ischemic attack, unspecified: Secondary | ICD-10-CM

## 2024-01-06 DIAGNOSIS — I639 Cerebral infarction, unspecified: Secondary | ICD-10-CM | POA: Diagnosis not present

## 2024-01-06 DIAGNOSIS — G8929 Other chronic pain: Secondary | ICD-10-CM

## 2024-01-06 MED ORDER — IOPAMIDOL (ISOVUE-370) INJECTION 76%
75.0000 mL | Freq: Once | INTRAVENOUS | Status: AC | PRN
Start: 2024-01-06 — End: 2024-01-06
  Administered 2024-01-06: 75 mL via INTRAVENOUS

## 2024-01-08 ENCOUNTER — Telehealth: Payer: Self-pay | Admitting: Neurology

## 2024-01-08 NOTE — Telephone Encounter (Signed)
 Call to patient, she reports the tramadol is not helping her headaches. She reports taking up to 1.5 tablets for relief but still not helping. She does report the Cymbalta  has helped. Advised that per Dr. Onita note from last week visit she didn't mention continuing tramadol and I also advised she take any medication as prescribed and not at her own discretion. She then begin stating she was dizzy and weak and and thought she could have had a TIA and had previous a stroke. Advised that if she felt she was having a stroke she should got to ER for evaluation. She sated she did not want to go to ER. I advised I would let Dr. Onita know about her headaches. She has appt with pain management at end of October.

## 2024-01-08 NOTE — Telephone Encounter (Signed)
 Pt called to  request MD to to Prescriber pain medication.  PT  states Dr.Cram recommended  Pt to speak with  Neurologist  to  see if pt medication  can be fill. Pt states  traMADol (ULTRAM) 50 MG tablet  is not working for her at all .  [facility] - [place], [place] - [address] Phone: [phone]  Fax: [phone]

## 2024-01-09 ENCOUNTER — Telehealth: Payer: Self-pay

## 2024-01-09 ENCOUNTER — Other Ambulatory Visit (HOSPITAL_COMMUNITY): Payer: Self-pay

## 2024-01-09 DIAGNOSIS — G43709 Chronic migraine without aura, not intractable, without status migrainosus: Secondary | ICD-10-CM | POA: Insufficient documentation

## 2024-01-09 MED ORDER — DULOXETINE HCL 40 MG PO CPEP: 40.0000 mg | ORAL_CAPSULE | Freq: Every day | ORAL | 3 refills | Status: DC

## 2024-01-09 MED ORDER — NURTEC 75 MG PO TBDP: 75.0000 mg | ORAL_TABLET | ORAL | 5 refills | Status: DC

## 2024-01-09 NOTE — Addendum Note (Signed)
 Addended by: JOSHUA IZETTA CROME on: 01/09/2024 03:21 PM   Modules accepted: Orders

## 2024-01-09 NOTE — Telephone Encounter (Signed)
 I received a request to do a PA for Nurtec, It is indicated for use with patients with migraine headaches. Migraine is mentioned numerous times in chart however I could not find a DX code for Migraines in the chart. If applicable can you also add that to the problem list in chart. Thank you!

## 2024-01-09 NOTE — Telephone Encounter (Signed)
 Call to patient reviewed Dr. Onita recommendations, patient verbalized understanding and in agreement. PA for Nurtec sent to PA team. Patient states will no longer take tramadol

## 2024-01-09 NOTE — Telephone Encounter (Signed)
 Please let patient know, there was no formal report of the CT angiogram head and neck yet,  If she can tolerate Cymbalta  20 mg every day, she can increase the dosage to 2 of 20 mg daily,  I have sending higher dose of Cymbalta  40 mg to her pharmacy  Reviewed her chart, she has tried and failed many different medications in the past for her headache, I can try new category of medicine CGRP antagonist if her insurance approves,   Meds ordered this encounter  Medications   DULoxetine  40 MG CPEP    Sig: Take 1 capsule (40 mg total) by mouth daily.    Dispense:  30 capsule    Refill:  3   Rimegepant Sulfate (NURTEC) 75 MG TBDP    Sig: Take 1 tablet (75 mg total) by mouth every other day.    Dispense:  16 tablet    Refill:  5

## 2024-01-10 ENCOUNTER — Other Ambulatory Visit: Payer: Self-pay | Admitting: Primary Care

## 2024-01-10 ENCOUNTER — Other Ambulatory Visit (HOSPITAL_COMMUNITY): Payer: Self-pay

## 2024-01-10 DIAGNOSIS — I10 Essential (primary) hypertension: Secondary | ICD-10-CM

## 2024-01-10 NOTE — Telephone Encounter (Signed)
 Pharmacy Patient Advocate Encounter   Received notification from Physician's Office that prior authorization for Nurtec is required/requested.   Insurance verification completed.   The patient is insured through [facility] .   Per test claim: PA required; PA submitted to above mentioned insurance via Latent Key/confirmation #/EOC [employer] Status is pending  THANK YOU SO MUCH for updating the DX code!

## 2024-01-10 NOTE — Telephone Encounter (Signed)
 Pharmacy Patient Advocate Encounter  Received notification from [employer] that Prior Authorization for Nurtec has been APPROVED from 01/10/2024 to 05/13/2024. Ran test claim, Copay is $577.94. This test claim was processed through [facility]- copay amounts may vary at other pharmacies due to pharmacy/plan contracts, or as the patient moves through the different stages of their insurance plan.   PA #/Case ID/Reference #: [id number]  HIGH COPAY!

## 2024-01-16 ENCOUNTER — Ambulatory Visit: Admitting: Cardiology

## 2024-01-21 ENCOUNTER — Encounter: Payer: Self-pay | Admitting: Internal Medicine

## 2024-01-21 ENCOUNTER — Other Ambulatory Visit: Payer: Self-pay | Admitting: Primary Care

## 2024-01-21 DIAGNOSIS — K219 Gastro-esophageal reflux disease without esophagitis: Secondary | ICD-10-CM

## 2024-01-21 DIAGNOSIS — R1319 Other dysphagia: Secondary | ICD-10-CM

## 2024-01-21 DIAGNOSIS — K224 Dyskinesia of esophagus: Secondary | ICD-10-CM

## 2024-01-21 NOTE — Telephone Encounter (Signed)
Called patient. No answer. Left voicemail to call back.

## 2024-01-21 NOTE — Telephone Encounter (Signed)
 Go ahead and call and get some more information about the concerns. I wasn't the last provider here to see her, it has been over a year.

## 2024-01-23 ENCOUNTER — Other Ambulatory Visit

## 2024-01-23 DIAGNOSIS — E039 Hypothyroidism, unspecified: Secondary | ICD-10-CM | POA: Diagnosis not present

## 2024-01-23 LAB — T4, FREE: Free T4: 1.2 ng/dL (ref 0.8–1.8)

## 2024-01-23 LAB — TSH: TSH: 0.56 m[IU]/L (ref 0.40–4.50)

## 2024-01-23 NOTE — Addendum Note (Signed)
 Addended by: Wm Sahagun K on: 01/23/2024 12:51 PM   Modules accepted: Orders

## 2024-01-24 ENCOUNTER — Ambulatory Visit: Payer: Self-pay | Admitting: Internal Medicine

## 2024-01-24 ENCOUNTER — Encounter: Payer: Self-pay | Admitting: Internal Medicine

## 2024-01-31 ENCOUNTER — Encounter: Payer: Self-pay | Admitting: Cardiology

## 2024-01-31 ENCOUNTER — Ambulatory Visit: Attending: Cardiology | Admitting: Cardiology

## 2024-01-31 VITALS — BP 138/68 | HR 78 | Ht 63.0 in | Wt 138.6 lb

## 2024-01-31 DIAGNOSIS — R519 Headache, unspecified: Secondary | ICD-10-CM

## 2024-01-31 DIAGNOSIS — I341 Nonrheumatic mitral (valve) prolapse: Secondary | ICD-10-CM

## 2024-01-31 DIAGNOSIS — I10 Essential (primary) hypertension: Secondary | ICD-10-CM | POA: Diagnosis not present

## 2024-01-31 DIAGNOSIS — N183 Chronic kidney disease, stage 3 unspecified: Secondary | ICD-10-CM

## 2024-01-31 DIAGNOSIS — G8929 Other chronic pain: Secondary | ICD-10-CM | POA: Diagnosis not present

## 2024-01-31 DIAGNOSIS — R42 Dizziness and giddiness: Secondary | ICD-10-CM | POA: Diagnosis not present

## 2024-01-31 DIAGNOSIS — G459 Transient cerebral ischemic attack, unspecified: Secondary | ICD-10-CM

## 2024-01-31 NOTE — Progress Notes (Signed)
 Cardiology Office Note   Date:  01/31/2024  ID:  SERGIO HOBART, DOB Aug 12, 1955, MRN [id number] PCP: Gretta Comer POUR, NP  [facility] Providers Cardiologist:  Lonni Hanson, MD     History of Present Illness Katie Robbins is a 68 y.o. female with a past medical history of hypertension, questionable MVP mild mitral regurgitation, chronic chest pain attributed to nutcracker esophagitis, left upper extremity DVT related to PICC line (approximately 2007), Gilliam Paredes syndrome, hypothyroidism, osteoarthritis, anxiety, who presents today for follow-up.   She stated she had previously had a cardiac catheterization in the past but the report was unable to be found.  Echocardiogram completed 07/06/2022 revealed LVEF of 60-65%, with normal wall motion, G1 DD, mild mitral valve calcification and regurgitation, coronary CTA completed 06/25/2022 showed normal right dominant coronary arteries without stenosis or calcification, no significant extracardiac findings identified in the visualized chest, stress echo in 12/2017 showed poor exercise capacity with submaximal heart rate, no wall motion abnormalities identified at rest or during stress.   She was seen in clinic 07/11/2022 by Dr. Hanson.  At that time her symptoms unchanged since her previous visit.  She believes that her elevated blood pressure was causing worsening headaches for which she had received trigger point injections.  There were no changes made to her medication regimen or any further testing that was ordered.  She was to return in 3 months to have her blood pressure reevaluated.  She was seen in clinic 10/09/2018/she was doing fairly well.  She was having some pain in her neck and was placed on approximately [redacted] weeks pregnant surgery down.  She denied any chest pain, shortness of breath, palpitations or peripheral edema.  There was some questions related to her losartan prescription.  There were no changes to her medication that were  made and no further testing that was ordered at this time.   She was last seen in clinic on/12/24 by Dr. Hanson.  At that time she was feeling worn out after running a lot of errands.  She had stable exertional dyspnea with climbing stairs but otherwise felt well denying any other associated symptoms.  She was continued on her current medication regimen without changes and was advised that she would need clinical follow-up with repeat echocardiogram every 3 to 5 years or sooner if symptoms progress.  She returns to clinic today accompanied by her husband with several questions and concerns related to previous CTA of the head and neck that she had completed that been ordered by her neurologist.  She denies any chest pain palpitations, or peripheral edema.  Unfortunately she has been having worsening fatigue and pain and swelling at the base of her skull.  She had previously had a fusion completed on her neck and thinks that some of her symptoms are related to physical therapy that she had when she was placed on a machine and stretched postoperatively.  There are several findings on her scans that she had questions about today.  She states that she has been compliant with her current medications.  Has follow-up with pain clinic that has been scheduled.  Denies any recent hospitalizations or visits to the emergency department  ROS: 10 point review of system has been reviewed and considered negative except ones are listed in the HPI  Studies Reviewed EKG Interpretation Date/Time:  Friday January 31 2024 15:08:58 EDT Ventricular Rate:  78 PR Interval:  180 QRS Duration:  88 QT Interval:  404 QTC Calculation: 460 R  Axis:   -11  Text Interpretation: Normal sinus rhythm Normal ECG When compared with ECG of 16-Jan-2023 16:00, No significant change was found Confirmed by Gerard Frederick ([id number]) on 01/31/2024 3:13:59 PM    TTE 07/06/22 1. Left ventricular ejection fraction, by estimation, is 60 to 65%. The   left ventricle has normal function. The left ventricle has no regional  wall motion abnormalities. Left ventricular diastolic parameters are  consistent with Grade I diastolic  dysfunction (impaired relaxation).   2. Right ventricular systolic function is normal. The right ventricular  size is normal. Tricuspid regurgitation signal is inadequate for assessing  PA pressure.   3. The mitral valve is normal in structure. Mild mitral valve  regurgitation. No evidence of mitral stenosis.   4. The aortic valve is tricuspid. Aortic valve regurgitation is not  visualized. No aortic stenosis is present.   5. The inferior vena cava is normal in size with greater than 50%  respiratory variability, suggesting right atrial pressure of 3 mmHg.    Coronary CTA 06/25/22 IMPRESSION: 1. Normal coronary calcium score of 0. Patient is low risk.   2. Normal coronary origin with right dominance.   3. No evidence of CAD.   4. CAD-RADS 0. Consider non-atherosclerotic causes of chest pain. Risk Assessment/Calculations           Physical Exam VS:  BP 138/68 (BP Location: Left Arm, Patient Position: Sitting, Cuff Size: Normal)   Pulse 78   Ht 5' 3 (1.6 m)   Wt 138 lb 9.6 oz (62.9 kg)   SpO2 97%   BMI 24.55 kg/m        Wt Readings from Last 3 Encounters:  01/31/24 138 lb 9.6 oz (62.9 kg)  01/03/24 134 lb (60.8 kg)  12/30/23 134 lb 8 oz (61 kg)    GEN: Well nourished, well developed in no acute distress NECK: No JVD; No carotid bruits CARDIAC: RRR, no murmurs, mid-systolic click noted, without rubs or gallops RESPIRATORY:  Clear to auscultation without rales, wheezing or rhonchi  ABDOMEN: Soft, non-tender, non-distended EXTREMITIES:  No edema; No deformity   ASSESSMENT AND PLAN Hypertension with a blood pressure today of 138/68.  Blood pressure has been well-controlled.  She is continued on amlodipine  5 mg daily he and Benicar  20 mg daily.  She has been encouraged to continue to monitor  pressure 1 to 2 hours postmedication administration at home as well.  CKD stage III with last serum creatinine 1.05 which has improved.  Encouraged continuing with hydration.  Encouraged to avoid NSAIDs.  Mitral valve prolapse echocardiogram) structurally normal mitral valve with mild MR.  Midsystolic click is audible on exam.  Mild prolapse is noted recommend repeat echocardiograms every 3 to 5 years or sooner if needed.  Hypothyroidism she is continued on levothyroxine .  Ongoing management per endocrinologist.  Chronic occipital area headaches followed by neurology where she has had multiple cervical surgeries including ACDF C3-4, C5-7, and history of bilateral C2 ganglion ectomy without improvement in her occipital area headache.  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  No no stenosis noted in the anterior or posterior circulation carotid arteries.  With multiple questions and concerns today with previous DVT patient is being scheduled for a carotid duplex to see the remainder of the carotid artery and the subclavian's to ensure there is no stenosis.       Dispo: Patient to return to clinic to see MD/APP once testing is completed or sooner if  needed for further evaluation  Signed, Asif Muchow, NP

## 2024-01-31 NOTE — Patient Instructions (Signed)
 Medication Instructions:  Your physician recommends that you continue on your current medications as directed. Please refer to the Current Medication list given to you today.   *If you need a refill on your cardiac medications before your next appointment, please call your pharmacy*  Lab Work: No labs ordered today  If you have labs (blood work) drawn today and your tests are completely normal, you will receive your results only by: MyChart Message (if you have MyChart) OR A paper copy in the mail If you have any lab test that is abnormal or we need to change your treatment, we will call you to review the results.  Testing/Procedures: Your physician has requested that you have a carotid duplex. This test is an ultrasound of the carotid arteries in your neck. It looks at blood flow through these arteries that supply the brain with blood.   Allow one hour for this exam.  There are no restrictions or special instructions.  This will take place at [address] [facility]) [address], [place] [address]  Please note: We ask at that you not bring children with you during ultrasound (echo/ vascular) testing. Due to room size and safety concerns, children are not allowed in the ultrasound rooms during exams. Our front office staff cannot provide observation of children in our lobby area while testing is being conducted. An adult accompanying a patient to their appointment will only be allowed in the ultrasound room at the discretion of the ultrasound technician under special circumstances. We apologize for any inconvenience.   Follow-Up: At [facility], you and your health needs are our priority.  As part of our continuing mission to provide you with exceptional heart care, our providers are all part of one team.  This team includes your primary Cardiologist (physician) and Advanced Practice Providers or APPs (Physician Assistants and Nurse Practitioners) who all work together to  provide you with the care you need, when you need it.  Your next appointment:   6 week(s)  Provider:   You may see Lonni Hanson, MD or one of the following Advanced Practice Providers on your designated Care Team:   Lonni Meager, NP Lesley Maffucci, PA-C Bernardino Bring, PA-C Cadence Lomita, PA-C Tylene Lunch, NP Barnie Hila, NP

## 2024-02-01 ENCOUNTER — Encounter: Payer: Self-pay | Admitting: Neurology

## 2024-02-01 DIAGNOSIS — G43709 Chronic migraine without aura, not intractable, without status migrainosus: Secondary | ICD-10-CM

## 2024-02-01 DIAGNOSIS — G8929 Other chronic pain: Secondary | ICD-10-CM

## 2024-02-01 DIAGNOSIS — G459 Transient cerebral ischemic attack, unspecified: Secondary | ICD-10-CM

## 2024-02-03 ENCOUNTER — Telehealth: Payer: Self-pay | Admitting: Neurology

## 2024-02-03 NOTE — Telephone Encounter (Signed)
Orders Placed This Encounter  Procedures   Ambulatory referral to Pain Clinic     

## 2024-02-03 NOTE — Addendum Note (Signed)
 Addended by: Devan Babino on: 02/03/2024 04:12 PM   Modules accepted: Orders

## 2024-02-03 NOTE — Telephone Encounter (Signed)
 Referral for pain clinic fax to [facility]. Phone: [phone], Fax: [phone]

## 2024-02-10 ENCOUNTER — Ambulatory Visit (HOSPITAL_COMMUNITY)

## 2024-02-10 ENCOUNTER — Ambulatory Visit (HOSPITAL_COMMUNITY)
Admission: RE | Admit: 2024-02-10 | Discharge: 2024-02-10 | Disposition: A | Discharge status: Home / Self Care | Source: Ambulatory Visit | Attending: Cardiology | Admitting: Cardiology

## 2024-02-10 DIAGNOSIS — G459 Transient cerebral ischemic attack, unspecified: Secondary | ICD-10-CM | POA: Insufficient documentation

## 2024-02-11 ENCOUNTER — Emergency Department (HOSPITAL_COMMUNITY)
Admission: EM | Admit: 2024-02-11 | Discharge: 2024-02-11 | Disposition: A | Discharge status: Home / Self Care | Attending: Emergency Medicine | Admitting: Emergency Medicine

## 2024-02-11 ENCOUNTER — Ambulatory Visit: Payer: Self-pay | Admitting: Cardiology

## 2024-02-11 ENCOUNTER — Encounter (HOSPITAL_COMMUNITY): Payer: Self-pay

## 2024-02-11 ENCOUNTER — Other Ambulatory Visit: Payer: Self-pay

## 2024-02-11 DIAGNOSIS — F10920 Alcohol use, unspecified with intoxication, uncomplicated: Secondary | ICD-10-CM | POA: Diagnosis not present

## 2024-02-11 DIAGNOSIS — M542 Cervicalgia: Secondary | ICD-10-CM | POA: Insufficient documentation

## 2024-02-11 DIAGNOSIS — Y904 Blood alcohol level of 80-99 mg/100 ml: Secondary | ICD-10-CM | POA: Insufficient documentation

## 2024-02-11 DIAGNOSIS — Z7982 Long term (current) use of aspirin: Secondary | ICD-10-CM | POA: Insufficient documentation

## 2024-02-11 DIAGNOSIS — Z91040 Latex allergy status: Secondary | ICD-10-CM | POA: Diagnosis not present

## 2024-02-11 DIAGNOSIS — R519 Headache, unspecified: Secondary | ICD-10-CM | POA: Insufficient documentation

## 2024-02-11 DIAGNOSIS — G8929 Other chronic pain: Secondary | ICD-10-CM | POA: Diagnosis not present

## 2024-02-11 LAB — COMPREHENSIVE METABOLIC PANEL WITH GFR
ALT: 10 U/L (ref 0–44)
AST: 16 U/L (ref 15–41)
Albumin: 3.9 g/dL (ref 3.5–5.0)
Alkaline Phosphatase: 77 U/L (ref 38–126)
Anion gap: 12 (ref 5–15)
BUN: 15 mg/dL (ref 8–23)
CO2: 20 mmol/L — ABNORMAL LOW (ref 22–32)
Calcium: 8.8 mg/dL — ABNORMAL LOW (ref 8.9–10.3)
Chloride: 104 mmol/L (ref 98–111)
Creatinine, Ser: 1.41 mg/dL — ABNORMAL HIGH (ref 0.44–1.00)
GFR, Estimated: 41 mL/min — ABNORMAL LOW (ref >60)
Glucose, Bld: 86 mg/dL (ref 70–99)
Potassium: 4 mmol/L (ref 3.5–5.1)
Sodium: 136 mmol/L (ref 135–145)
Total Bilirubin: 0.4 mg/dL (ref 0.0–1.2)
Total Protein: 7.1 g/dL (ref 6.5–8.1)

## 2024-02-11 LAB — CBC WITH DIFFERENTIAL/PLATELET
Abs Immature Granulocytes: 0.01 K/uL (ref 0.00–0.07)
Basophils Absolute: 0.1 K/uL (ref 0.0–0.1)
Basophils Relative: 1 %
Eosinophils Absolute: 0.2 K/uL (ref 0.0–0.5)
Eosinophils Relative: 2 %
HCT: 36.2 % (ref 36.0–46.0)
Hemoglobin: 11.7 g/dL — ABNORMAL LOW (ref 12.0–15.0)
Immature Granulocytes: 0 %
Lymphocytes Relative: 44 %
Lymphs Abs: 3.1 K/uL (ref 0.7–4.0)
MCH: 29 pg (ref 26.0–34.0)
MCHC: 32.3 g/dL (ref 30.0–36.0)
MCV: 89.6 fL (ref 80.0–100.0)
Monocytes Absolute: 0.4 K/uL (ref 0.1–1.0)
Monocytes Relative: 6 %
Neutro Abs: 3.4 K/uL (ref 1.7–7.7)
Neutrophils Relative %: 47 %
Platelets: 268 K/uL (ref 150–400)
RBC: 4.04 MIL/uL (ref 3.87–5.11)
RDW: 13.5 % (ref 11.5–15.5)
WBC: 7.2 K/uL (ref 4.0–10.5)
nRBC: 0 % (ref 0.0–0.2)

## 2024-02-11 LAB — ETHANOL: Alcohol, Ethyl (B): 95 mg/dL — ABNORMAL HIGH (ref <15)

## 2024-02-11 LAB — SALICYLATE LEVEL: Salicylate Lvl: <7 mg/dL — ABNORMAL LOW (ref 7.0–30.0)

## 2024-02-11 MED ORDER — DROPERIDOL 2.5 MG/ML IJ SOLN
1.2500 mg | Freq: Once | INTRAMUSCULAR | Status: AC
  Administered 2024-02-11: 1.25 mg via INTRAVENOUS
  Filled 2024-02-11: qty 2

## 2024-02-11 MED ORDER — LIDOCAINE 5 % EX PTCH
1.0000 | MEDICATED_PATCH | CUTANEOUS | Status: DC
  Administered 2024-02-11: 1 via TRANSDERMAL
  Filled 2024-02-11: qty 1

## 2024-02-11 NOTE — ED Notes (Signed)
 Per night shift RN, Pt took off all monitoring equipment except pulse ox and is refusing to put it back on.

## 2024-02-11 NOTE — ED Provider Notes (Signed)
 [place] EMERGENCY DEPARTMENT AT [facility] Provider Note   CSN: [id number] Arrival date & time: 02/11/24  9688     Patient presents with: Headache   Peggyann T Pinkstaff is a 68 y.o. female.   The history is provided by the patient and medical records.  Headache Talyia T Hinkle is a 68 y.o. female who presents to the Emergency Department complaining of headache. She presents the emergency department for evaluation of persistent occipital headache and posterior neck pain that that is been present since April 2024 following a ganglion ectomy. She states that she saw neurosurgery in May and she was released to pain management but does not have an appointment until October 27. She presents tonight because her headache is persistent and she is very frustrated and doesn't feel like she is able to live her life. She feels like she has trouble straightening her neck. She also feels swollen on the back of her head on the left side. She did have an outpatient MRI head and neck performed but does not have results available. She also reports taking for aspirin  sore goodie powders daily as well as drinking wine tonight. No fevers. She does get nauseated when she takes pain medication.    Prior to Admission medications   Medication Sig Start Date End Date Taking? Authorizing Provider  albuterol  (VENTOLIN  HFA) 108 (90 Base) MCG/ACT inhaler Inhale 2 puffs into the lungs every 6 (six) hours as needed for wheezing or shortness of breath. 12/11/23   Shelah Lamar RAMAN, MD  Alum & Mag Hydroxide-Simeth (GI COCKTAIL) SUSP suspension Take 20 mLs by mouth 2 (two) times daily. Maalox 30 mL, lidocaine  5 mL and dicyclomine 10 mL, dispense 450 mL,  SIG take 15 to 20 mL as needed 3-4 times daily. 04/25/23   Unk Corinn Skiff, MD  amLODipine  (NORVASC ) 5 MG tablet TAKE ONE TABLET BY MOUTH ONCE DAILY FOR BLOOD PRESSURE 01/10/24   Gretta Comer POUR, NP  amphetamine -dextroamphetamine  (ADDERALL) 20 MG tablet Take 20  mg by mouth See admin instructions. Take 1 tablet (20 mg) by mouth scheduled in the morning & in the afternoon, may take an additional dose in the evening, if needed for focus/attention. 01/17/21   [provider]  aspirin  EC 81 MG tablet Take 1 tablet (81 mg total) by mouth in the morning. 08/18/22   Cosentino, Allison R, PA-C  budesonide -formoterol  (SYMBICORT ) 160-4.5 MCG/ACT inhaler Inhale 2 puffs into the lungs 2 (two) times daily. 12/11/23   Byrum, Robert S, MD  carboxymethylcellulose (REFRESH PLUS) 0.5 % SOLN Place 1-2 drops into both eyes 3 (three) times daily as needed (dry/irritated eyes.).    [provider]  cyanocobalamin  (VITAMIN B12) 1000 MCG/ML injection INJECT ONE VIAL (1 ML) INTO THE MUSCLE EVERY THREE TO FOUR WEEKS 09/16/23   Gretta Comer POUR, NP  diazepam  (VALIUM ) 5 MG tablet Take 5 mg by mouth 4 (four) times daily as needed for anxiety.    [provider]  DULoxetine  40 MG CPEP Take 1 capsule (40 mg total) by mouth daily. 01/09/24   Onita Duos, MD  EPINEPHrine  0.3 mg/0.3 mL IJ SOAJ injection Inject 0.3 mg into the muscle as needed for anaphylaxis. 10/24/23   Clark, Katherine K, NP  estradiol  (ESTRACE ) 0.1 MG/GM vaginal cream Place 0.25 Applicatorfuls vaginally at bedtime. 05/21/23 05/20/24  Janit Alm Agent, MD  estradiol  (ESTRACE ) 1 MG tablet Take 1 tablet (1 mg total) by mouth daily. 05/21/23 05/20/24  Janit Alm Agent, MD  irbesartan  (AVAPRO )  75 MG tablet Take 0.5 tablets (37.5 mg total) by mouth daily. for blood pressure. 12/20/23   Clark, Katherine K, NP  levothyroxine  (SYNTHROID ) 100 MCG tablet Take 1 tablet (100 mcg total) by mouth as directed. 1 tablet Monday through Saturday 11/22/23   Shamleffer, Ibtehal Jaralla, MD  levothyroxine  (SYNTHROID ) 112 MCG tablet Take 1 tablet (112 mcg total) by mouth as directed. Every Sunday only 11/22/23   Shamleffer, Ibtehal Jaralla, MD  pantoprazole  (PROTONIX ) 40 MG tablet TAKE ONE TABLET (40 MG TOTAL) BY MOUTH DAILY. FOR  HEARTBURN. 01/21/24   Gretta Comer POUR, NP    Allergies: Bactrim [sulfamethoxazole-trimethoprim], Macrobid [nitrofurantoin macrocrystal], Tizanidine, Vicodin [hydrocodone-acetaminophen ], Trileptal  [oxcarbazepine ], Abilify [aripiprazole], Acetaminophen , Amaryl [glimepiride], Ambien  [zolpidem ], Cyclobenzaprine , Erythromycin base, Gabapentin, Hydrocodone, Klonopin [clonazepam], Nexium [esomeprazole magnesium], Nortriptyline, Olanzapine, Pregabalin, Prilosec [omeprazole], Quetiapine, Reglan  [metoclopramide ], Savella [milnacipran], Sulfa antibiotics, Tamoxifen, Tapentadol, Topiramate, Trimethoprim, Wasp venom, Zoloft  [sertraline ], Erythromycin, Lamotrigine , Latex, Molds & smuts, and Tramadol    Review of Systems  Neurological:  Positive for headaches.  All other systems reviewed and are negative.   Updated Vital Signs BP (!) 172/91 (BP Location: Right Arm)   Pulse 73   Temp 98 F (36.7 C)   Resp 17   Ht 5' 3 (1.6 m)   Wt 61.2 kg   SpO2 98%   BMI 23.91 kg/m   Physical Exam Vitals and nursing note reviewed.  Constitutional:      Appearance: She is well-developed.  HENT:     Head: Normocephalic and atraumatic.  Neck:     Comments: There is a midline cervical scar. There is mild soft tissue swelling on the left posterior cervical region without erythema or overlying skin changes. Cardiovascular:     Rate and Rhythm: Normal rate and regular rhythm.     Heart sounds: No murmur heard. Pulmonary:     Effort: Pulmonary effort is normal. No respiratory distress.     Breath sounds: Normal breath sounds.  Abdominal:     Palpations: Abdomen is soft.     Tenderness: There is no abdominal tenderness. There is no guarding or rebound.  Musculoskeletal:        General: No tenderness.  Skin:    General: Skin is warm and dry.  Neurological:     Mental Status: She is alert and oriented to person, place, and time.     Comments: Five out of five strength in all four extremities.  Psychiatric:      Comments: Tearful     (all labs ordered are listed, but only abnormal results are displayed) Labs Reviewed  ETHANOL - Abnormal; Notable for the following components:      Result Value   Alcohol , Ethyl (B) 95 (*)    All other components within normal limits  COMPREHENSIVE METABOLIC PANEL WITH GFR - Abnormal; Notable for the following components:   CO2 20 (*)    Creatinine, Ser 1.41 (*)    Calcium 8.8 (*)    GFR, Estimated 41 (*)    All other components within normal limits  CBC WITH DIFFERENTIAL/PLATELET - Abnormal; Notable for the following components:   Hemoglobin 11.7 (*)    All other components within normal limits  SALICYLATE LEVEL - Abnormal; Notable for the following components:   Salicylate Lvl <7.0 (*)    All other components within normal limits    EKG: None  Radiology: VAS US  CAROTID Result Date: 02/10/2024 Carotid Arterial Duplex Study Patient Name:  ROMINA DIVIRGILIO  Date of Exam:   02/10/2024 Medical  Rec #: [id number]        Accession #:    [id number] Date of Birth: [birth year]        Patient Gender: F Patient Age:   72 years Exam Location:  [address] Procedure:      VAS US  CAROTID Referring Phys: SHERI HAMMOCK --------------------------------------------------------------------------------  Indications:       Carotid artery disease. Risk Factors:      Hypertension. Other Factors:     Myriam Leisure. Comparison Study:  No prior exam. Performing Technologist: Devere Dark RVT  Examination Guidelines: A complete evaluation includes B-mode imaging, spectral Doppler, color Doppler, and power Doppler as needed of all accessible portions of each vessel. Bilateral testing is considered an integral part of a complete examination. Limited examinations for reoccurring indications may be performed as noted.  Right Carotid Findings: +----------+--------+--------+--------+------------------+--------+           PSV cm/sEDV cm/sStenosisPlaque DescriptionComments  +----------+--------+--------+--------+------------------+--------+ CCA Prox  95      15                                         +----------+--------+--------+--------+------------------+--------+ CCA Mid   88      16                                         +----------+--------+--------+--------+------------------+--------+ CCA Distal75      17                                         +----------+--------+--------+--------+------------------+--------+ ICA Prox  80      26                                         +----------+--------+--------+--------+------------------+--------+ ICA Mid   82      23                                         +----------+--------+--------+--------+------------------+--------+ ICA Distal72      24                                         +----------+--------+--------+--------+------------------+--------+ ECA       66      4                                          +----------+--------+--------+--------+------------------+--------+ +----------+--------+-------+----------------+-------------------+           PSV cm/sEDV cmsDescribe        Arm Pressure (mmHG) +----------+--------+-------+----------------+-------------------+ Subclavian120     120    Multiphasic, [id number]                 +----------+--------+-------+----------------+-------------------+ +---------+--------+--+--------+-+---------+ VertebralPSV cm/s34EDV cm/s7Antegrade +---------+--------+--+--------+-+---------+  Left Carotid Findings: +----------+--------+--------+--------+------------------+--------+           PSV cm/sEDV cm/sStenosisPlaque DescriptionComments +----------+--------+--------+--------+------------------+--------+ CCA Prox  85      15                                         +----------+--------+--------+--------+------------------+--------+ CCA Mid   74      15                                          +----------+--------+--------+--------+------------------+--------+ CCA Distal69      15                                         +----------+--------+--------+--------+------------------+--------+ ICA Prox  70      22                                         +----------+--------+--------+--------+------------------+--------+ ICA Mid   71      24                                         +----------+--------+--------+--------+------------------+--------+ ICA Distal94      27                                         +----------+--------+--------+--------+------------------+--------+ ECA       59      5                                          +----------+--------+--------+--------+------------------+--------+ +----------+--------+--------+--------+-------------------+           PSV cm/sEDV cm/sDescribeArm Pressure (mmHG) +----------+--------+--------+--------+-------------------+ Subclavian130     2               132                 +----------+--------+--------+--------+-------------------+ +---------+--------+--+--------+--+---------+ VertebralPSV cm/s48EDV cm/s14Antegrade +---------+--------+--+--------+--+---------+   Summary: Right Carotid: There is no evidence of stenosis in the right ICA. Left Carotid: There is no evidence of stenosis in the left ICA. Vertebrals:  Bilateral vertebral arteries demonstrate antegrade flow. Subclavians: Normal flow hemodynamics were seen in bilateral subclavian              arteries. *See table(s) above for measurements and observations.     Preliminary      Procedures   Medications Ordered in the ED  lidocaine  (LIDODERM ) 5 % 1 patch (1 patch Transdermal Patch Applied 02/11/24 0448)  droperidol (INAPSINE) 2.5 MG/ML injection 1.25 mg (1.25 mg Intravenous Given 02/11/24 0640)                                    Medical Decision Making Amount and/or Complexity of Data Reviewed Labs: ordered.  Risk Prescription drug  management.   Patient with history of chronic pain here for evaluation of persistent chronic pain to her neck. She  did drink alcohol  tonight. She does not have any focal neurologic deficits. She did have recent imaging obtained at another imaging center, unable to review the results. Do not feel she warrants additional imaging at this time as she has recently had evaluation. Labs with stable renal insufficiency. Patient was treated with medications for her chronic pain. Discussed that she will need to follow up with her primary care as well as neurologist and neurosurgeon regarding her symptoms. Also discussed that she should not drive given her current state. Feels she is stable for outpatient follow-up with return precautions.     Final diagnoses:  Chronic intractable headache, unspecified headache type  Alcoholic intoxication without complication    ED Discharge Orders     None          Griselda Norris, MD 02/11/24 [phone]

## 2024-02-11 NOTE — ED Triage Notes (Signed)
 Pt arrived from home via POV c/o head ache 10/10 poster left side. Pt states that she had a gangliectomy 04/24 after that at rehab they placed her on a stretching machine and her head has hurt since that. Pt states that she just can not take it any more and needs some relief.

## 2024-02-11 NOTE — ED Notes (Signed)
 Pt states her husband is going to be livid because she took his truck to drive to the hospital and she had drank a few glasses of wine tonight.. when asked why she didn't wake him up she said he had taken a tramadol.

## 2024-02-11 NOTE — ED Notes (Signed)
 Pt was given discharge instructions. Pt and husband verbalized understanding. Husband took pt home. Pt was wheeled out to the car with her husband in a wheelchair and secured in her seat belt.

## 2024-02-21 DIAGNOSIS — M129 Arthropathy, unspecified: Secondary | ICD-10-CM | POA: Diagnosis not present

## 2024-02-21 DIAGNOSIS — R030 Elevated blood-pressure reading, without diagnosis of hypertension: Secondary | ICD-10-CM | POA: Diagnosis not present

## 2024-02-21 DIAGNOSIS — M5412 Radiculopathy, cervical region: Secondary | ICD-10-CM | POA: Diagnosis not present

## 2024-02-21 DIAGNOSIS — Z79899 Other long term (current) drug therapy: Secondary | ICD-10-CM | POA: Diagnosis not present

## 2024-02-27 DIAGNOSIS — R300 Dysuria: Secondary | ICD-10-CM

## 2024-02-27 NOTE — Telephone Encounter (Signed)
 Kelli, will you get a urine kit together for her husband to pick up on 02/28/2024?

## 2024-02-28 ENCOUNTER — Other Ambulatory Visit (INDEPENDENT_AMBULATORY_CARE_PROVIDER_SITE_OTHER)

## 2024-02-28 ENCOUNTER — Ambulatory Visit: Payer: Self-pay | Admitting: Primary Care

## 2024-02-28 DIAGNOSIS — R300 Dysuria: Secondary | ICD-10-CM

## 2024-02-28 DIAGNOSIS — M5412 Radiculopathy, cervical region: Secondary | ICD-10-CM | POA: Diagnosis not present

## 2024-02-28 DIAGNOSIS — R1319 Other dysphagia: Secondary | ICD-10-CM

## 2024-02-28 DIAGNOSIS — R030 Elevated blood-pressure reading, without diagnosis of hypertension: Secondary | ICD-10-CM | POA: Diagnosis not present

## 2024-02-28 DIAGNOSIS — R143 Flatulence: Secondary | ICD-10-CM

## 2024-02-28 DIAGNOSIS — R3129 Other microscopic hematuria: Secondary | ICD-10-CM

## 2024-02-28 DIAGNOSIS — N3001 Acute cystitis with hematuria: Secondary | ICD-10-CM

## 2024-02-28 DIAGNOSIS — K219 Gastro-esophageal reflux disease without esophagitis: Secondary | ICD-10-CM

## 2024-02-28 DIAGNOSIS — Z79899 Other long term (current) drug therapy: Secondary | ICD-10-CM | POA: Diagnosis not present

## 2024-02-28 LAB — POC URINALSYSI DIPSTICK (AUTOMATED)
Bilirubin, UA: NEGATIVE
Blood, UA: POSITIVE
Glucose, UA: NEGATIVE
Nitrite, UA: NEGATIVE
Protein, UA: NEGATIVE
Spec Grav, UA: 1.015 (ref 1.010–1.025)
Urobilinogen, UA: NEGATIVE U/dL — AB
pH, UA: 5 (ref 5.0–8.0)

## 2024-02-28 MED ORDER — CEPHALEXIN 500 MG PO CAPS: 500.0000 mg | ORAL_CAPSULE | Freq: Two times a day (BID) | ORAL | 0 refills | Status: AC

## 2024-02-28 NOTE — Telephone Encounter (Signed)
 Pt came in and provided urine sample, results forwarded to provider.

## 2024-03-01 LAB — URINE CULTURE
MICRO NUMBER:: 17114474
SPECIMEN QUALITY:: ADEQUATE

## 2024-03-02 DIAGNOSIS — N898 Other specified noninflammatory disorders of vagina: Secondary | ICD-10-CM

## 2024-03-06 ENCOUNTER — Other Ambulatory Visit: Payer: Self-pay | Admitting: Nurse Practitioner

## 2024-03-06 NOTE — Telephone Encounter (Signed)
 Patient being treated for UTI by Katie Gaskins, NP.  Now having yeast infection symptoms.  Sending to [facility] since Katie is out of the office.  Patient uses [facility].   Topic: Clinical - Medical Advice >> Mar 06, 2024  1:23 PM Katie Robbins wrote: Reason for CRM: patient called in stated she was on antibiotic for bladder infection, she nows have a yeast infection was calling if she could get diflucan  prescription sent to the pharmacy want the pill not the cream

## 2024-03-06 NOTE — Progress Notes (Signed)
 Error

## 2024-03-08 MED ORDER — FLUCONAZOLE 150 MG PO TABS: 150.0000 mg | ORAL_TABLET | Freq: Once | ORAL | 0 refills | Status: AC

## 2024-03-08 NOTE — Addendum Note (Signed)
 Addended by: Genever Hentges K on: 03/08/2024 02:18 AM   Modules accepted: Orders

## 2024-03-09 ENCOUNTER — Encounter: Admitting: Physical Medicine and Rehabilitation

## 2024-03-17 ENCOUNTER — Encounter: Payer: Self-pay | Admitting: *Deleted

## 2024-03-20 ENCOUNTER — Encounter: Payer: Self-pay | Admitting: Cardiology

## 2024-03-20 ENCOUNTER — Ambulatory Visit: Attending: Cardiology | Admitting: Cardiology

## 2024-03-20 VITALS — BP 132/78 | HR 90 | Ht 63.75 in | Wt 135.0 lb

## 2024-03-20 DIAGNOSIS — I341 Nonrheumatic mitral (valve) prolapse: Secondary | ICD-10-CM | POA: Diagnosis not present

## 2024-03-20 DIAGNOSIS — I10 Essential (primary) hypertension: Secondary | ICD-10-CM | POA: Diagnosis not present

## 2024-03-20 DIAGNOSIS — E039 Hypothyroidism, unspecified: Secondary | ICD-10-CM

## 2024-03-20 DIAGNOSIS — N183 Chronic kidney disease, stage 3 unspecified: Secondary | ICD-10-CM | POA: Diagnosis not present

## 2024-03-20 DIAGNOSIS — G43709 Chronic migraine without aura, not intractable, without status migrainosus: Secondary | ICD-10-CM

## 2024-03-20 NOTE — Patient Instructions (Signed)
 Medication Instructions:  Your physician recommends that you continue on your current medications as directed. Please refer to the Current Medication list given to you today.  *If you need a refill on your cardiac medications before your next appointment, please call your pharmacy*  Lab Work: No labs ordered today  If you have labs (blood work) drawn today and your tests are completely normal, you will receive your results only by: MyChart Message (if you have MyChart) OR A paper copy in the mail If you have any lab test that is abnormal or we need to change your treatment, we will call you to review the results.  Testing/Procedures: No test ordered today   Follow-Up: At [facility], you and your health needs are our priority.  As part of our continuing mission to provide you with exceptional heart care, our providers are all part of one team.  This team includes your primary Cardiologist (physician) and Advanced Practice Providers or APPs (Physician Assistants and Nurse Practitioners) who all work together to provide you with the care you need, when you need it.  Your next appointment:   6 month(s)  Provider:   You may see Sammy Crisp, MD or one of the following Advanced Practice Providers on your designated Care Team:   Laneta Pintos, NP Gildardo Labrador, PA-C Varney Gentleman, PA-C Cadence Rewey, PA-C Ronald Cockayne, NP Morey Ar, NP    We recommend signing up for the patient portal called "MyChart".  Sign up information is provided on this After Visit Summary.  MyChart is used to connect with patients for Virtual Visits (Telemedicine).  Patients are able to view lab/test results, encounter notes, upcoming appointments, etc.  Non-urgent messages can be sent to your provider as well.   To learn more about what you can do with MyChart, go to [email or web address].

## 2024-03-20 NOTE — Progress Notes (Signed)
 Cardiology Office Note   Date:  03/20/2024  ID:  Katie, Robbins 1955/12/14, MRN [id number] PCP: Gretta Comer POUR, NP  [facility] Providers Cardiologist:  Lonni Hanson, MD Cardiology APP:  Gerard Frederick, NP     History of Present Illness Katie Robbins is a 68 y.o. 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   She stated she had previously had a cardiac catheterization in the past but the report was unable to be found.  Echocardiogram completed 07/06/2022 revealed LVEF of 60-65%, with normal wall motion, G1 DD, mild mitral valve calcification and regurgitation, coronary CTA completed 06/25/2022 showed normal right dominant coronary arteries without stenosis or calcification, no significant extracardiac findings identified in the visualized chest, stress echo in 12/2017 showed poor exercise capacity with submaximal heart rate, no wall motion abnormalities identified at rest or during stress.   She was seen in clinic 07/11/2022 by Dr. Hanson.  At that time her symptoms unchanged since her previous visit.  She believes that her elevated blood pressure was causing worsening headaches for which she had received trigger point injections.  There were no changes made to her medication regimen or any further testing that was ordered.  She was to return in 3 months to have her blood pressure reevaluated.  She was seen in clinic 10/09/2018/she was doing fairly well.  She was having some pain in her neck and was placed on approximately [redacted] weeks pregnant surgery down.  She denied any chest pain, shortness of breath, palpitations or peripheral edema.  There was some questions related to her losartan prescription.  There were no  changes to her medication that were made and no further testing that was ordered at this time.  She was seen by Dr. Hanson on/12/24 at that time feeling worn out for medical nonadherence.  She states she had stable exertional dyspnea when climbing stairs but otherwise this was denied any other associated symptoms.  She was continued on her current medication regimen without changes advised she will need clinical follow-up with repeat echocardiogram every 3 to 5 years or sooner if symptoms progressed.  She was last seen in clinic 9//25 foot.  She had several questions and concerns related to previous CTA of the head and neck that was completed and ordered by her neurologist.  There were no medication changes that were made or further testing that was ordered at that time.   She returns to clinic today stating that overall she has been doing well from a cardiac perspective.  She denies any chest pain or shortness of breath.  Continues to suffer from occasional headaches.  Denies any lightheadedness or dizziness.  Blood pressure has been well-controlled on her current medication regimen.  She has been compliant with her current medications without any undue side effects.  She also denies any hospitalizations or visits to the emergency department.  She is following up with [facility] for her pain management as well.  ROS: 10 point review of systems has been reviewed and considered negative the exception was been listed in HPI  Studies Reviewed      TTE 07/06/22 1. Left ventricular ejection fraction, by estimation, is 60 to 65%. The  left ventricle has normal function. The left ventricle has no regional  wall motion abnormalities.  Left ventricular diastolic parameters are  consistent with Grade I diastolic  dysfunction (impaired relaxation).   2. Right ventricular systolic function is normal. The right ventricular  size is normal. Tricuspid regurgitation signal is inadequate for assessing  PA  pressure.   3. The mitral valve is normal in structure. Mild mitral valve  regurgitation. No evidence of mitral stenosis.   4. The aortic valve is tricuspid. Aortic valve regurgitation is not  visualized. No aortic stenosis is present.   5. The inferior vena cava is normal in size with greater than 50%  respiratory variability, suggesting right atrial pressure of 3 mmHg.    Coronary CTA 06/25/22 IMPRESSION: 1. Normal coronary calcium score of 0. Patient is low risk.   2. Normal coronary origin with right dominance.   3. No evidence of CAD.   4. CAD-RADS 0. Consider non-atherosclerotic causes of chest pain.  Risk Assessment/Calculations           Physical Exam VS:  BP 132/78 (BP Location: Left Arm, Patient Position: Sitting, Cuff Size: Normal)   Pulse 90   Ht 5' 3.75 (1.619 m)   Wt 135 lb (61.2 kg)   SpO2 97%   BMI 23.35 kg/m        Wt Readings from Last 3 Encounters:  03/20/24 135 lb (61.2 kg)  02/11/24 135 lb (61.2 kg)  01/31/24 138 lb 9.6 oz (62.9 kg)    GEN: Well nourished, well developed in no acute distress NECK: No JVD; No carotid bruits CARDIAC: RRR, no murmurs, rubs, gallops RESPIRATORY:  Clear to auscultation without rales, wheezing or rhonchi  ABDOMEN: Soft, non-tender, non-distended EXTREMITIES:  No edema; No deformity   ASSESSMENT AND PLAN Primary hypertension with a blood pressure today 132/78 which has remained stable.  Blood pressure has been well-controlled.  She has been continued on amlodipine  5 mg daily and 37.5 mg of irbesartan .  (Of note patient was previously on Benicar  and at some point in time has been changed to irbesartan ), she has been encouraged to continue to monitor pressures 1 to 2 hours postmedication administration as well.  CKD stage III last seen creatinine 1.41.  Slightly elevated from baseline.  Encouraged to continue to maintain adequate hydration and avoid nephrotoxic agents were able.  Mitral valve prolapse with a history of  with a structurally normal mitral valve with mild MR noted on echocardiogram.  Recommend repeat echocardiograms every 3 to 5 years or sooner if needed related to symptoms.  Hypothyroidism where she is continued on levothyroxine .  Ongoing management per endocrinology.  Chronic occipital area headaches followed by neurology.  Carotid duplex was completed to visualize the remainder of the carotid arteries and the top of subclavian veins with no stenosis noted.  Continue with surveillance studies as needed       Dispo: Patient to return to clinic see MD/APP in 6 months or sooner if needed for further evaluation  Signed, Jerzey Komperda, NP

## 2024-03-25 ENCOUNTER — Encounter: Payer: Self-pay | Admitting: Internal Medicine

## 2024-03-25 ENCOUNTER — Ambulatory Visit: Admitting: Internal Medicine

## 2024-03-25 ENCOUNTER — Other Ambulatory Visit

## 2024-03-25 VITALS — BP 130/80 | Ht 63.75 in | Wt 139.0 lb

## 2024-03-25 DIAGNOSIS — E039 Hypothyroidism, unspecified: Secondary | ICD-10-CM | POA: Diagnosis not present

## 2024-03-25 LAB — TSH: TSH: 4.95 m[IU]/L — ABNORMAL HIGH (ref 0.40–4.50)

## 2024-03-25 LAB — T4, FREE: Free T4: 1.1 ng/dL (ref 0.8–1.8)

## 2024-03-25 NOTE — Progress Notes (Signed)
 Name: Katie Robbins  MRN/ DOB: [id number], Mar 10, 1956    Age/ Sex: 68 y.o., female    PCP: Gretta Comer POUR, NP   Reason for Endocrinology Evaluation: Hypothyroidism     Date of Initial Endocrinology Evaluation: 12/06/2021    HPI: [name] is a 68 y.o. female with a past medical history of HTN, asthma, hypothyroid, neuropathy,Hx Guillain-Barr syndrome (1982). The patient presented for initial endocrinology clinic visit on 12/06/2021  for consultative assistance with her Hypothyroidism.   She has been on levothyroxine  1994   Denies prior exposure to radiation and thyroid  sx   Over the years she has had fluctuating TSH ranging from 0.11 uIU/mL in 07/2021 to 7.05 uIU/mL in 08/2019    Mother with PMR , giant cell arteritis and thyroid  disease   She has had neck surgery in 2018 with a repeat surgery of the cervical decompression/discectomy fusion  09/2023  She is s/p ganglionectomy's due to bilateral occipital neuralgia, medically refractory in 2024.   Patient with multiple neurological symptoms, and a previous diagnosis of GBS, follows with neurology, and associates levothyroxine  with GBS      SUBJECTIVE:    Today (03/25/24):  Katie Robbins is here for a follow up on Hypothyroidism.   Patient continues to follow-up with cardiology for HTN and mitral valve prolapse.  She remains on amlodipine  and irbesartan  She continues with chronic headaches, she underwent anterior cervical decompression/discectomy fusion 09/2023 with Dr. Onetha Patient had a follow-up with behavioral medicine for episodic mood disorder She did present to the ED for headaches in September, 2025, alcohol  level was elevated, salicylic levels were undetectable She is having headache today, she attributes to hearing the unfortunate news of that daughter getting into a car accident in [place] , stepdaughter with brain bleed that is under control and fractured ribs  Has a pending follow with neurology  Dr. Erin She also establish care with pain management at [facility].  No palpitations  Continues with  chronic  constipation - on miralax  Uses pill box  No Biotin  No hair loss But has been tired Spouse was recently diagnosed with prostate cancer, he does have history of bladder cancer approximately 8 years ago The patient has history of esophageal dilatation, she has noted recurrence, she will contact GI for follow-up    Levothyroxine  100 mcg daily      HISTORY:  Past Medical History:  Past Medical History:  Diagnosis Date   Acute thoracic back pain 07/22/2018   ADHD (attention deficit hyperactivity disorder)    Allergy    Anemia    Abnormal Uterine Bleeding - Resulted in Hysterectomy   Anxiety    Arthritis    Asthma    Blood transfusion without reported diagnosis 09/16/23   Dr Onetha   Chicken pox    Chronic headaches    Chronic kidney disease    CKD Stage 3a. No HD   Cognitive disorder    Concussion 07/2021   Depression    Diverticulitis    Electrocution 04/17/2022   Essential hypertension    GERD (gastroesophageal reflux disease)    Guillain Barr syndrome 1982   H/O breast implant    Heart murmur    Hypertension    Hypothyroidism    Interstitial cystitis    Interstitial cystitis    Mitral prolapse 1985   Myocardial infarction [facility])    Non-cardiac chest pain 06/13/2020   Nutcracker esophagus    Osteoarthritis    PONV (  postoperative nausea and vomiting)    patient states that she does well with phenergan    Soft tissue mass 03/31/2020   Stroke [facility])    Multiple TIAs. Last one Feb. 2024   Past Surgical History:  Past Surgical History:  Procedure Laterality Date   ABDOMINAL HYSTERECTOMY  2000/2009   ANTERIOR CERVICAL DECOMP/DISCECTOMY FUSION N/A 09/20/2023   Procedure: ANTERIOR CERVICAL DECOMPRESSION/DISCECTOMY FUSION CERVICAL FOUR-FIVE;  Surgeon: Onetha Kuba, MD;  Location: [facility] OR;  Service: Neurosurgery;  Laterality: N/A;  ACDF C45 3C    APPENDECTOMY  2007   AUGMENTATION MAMMAPLASTY     BELPHAROPTOSIS REPAIR Bilateral    Cosmetic. Eye Lift   Bilateral Cervical Two ganglionectomies  08/2022   BOWEL RESECTION  2007   CARDIAC CATHETERIZATION  2016   Clean   CARPAL TUNNEL RELEASE Bilateral    CHOLECYSTECTOMY  2012   COLON SURGERY  2007 abcess n fiscula   Bowel resection 13   COLONOSCOPY WITH PROPOFOL  N/A 04/13/2020   Procedure: COLONOSCOPY WITH PROPOFOL ;  Surgeon: Unk Corinn Skiff, MD;  Location: [facility] ENDOSCOPY;  Service: Gastroenterology;  Laterality: N/A;   COSMETIC SURGERY  2007   Breast augmentation   ESOPHAGOGASTRODUODENOSCOPY (EGD) WITH PROPOFOL  N/A 04/13/2020   Procedure: ESOPHAGOGASTRODUODENOSCOPY (EGD) WITH PROPOFOL ;  Surgeon: Unk Corinn Skiff, MD;  Location: [facility] ENDOSCOPY;  Service: Gastroenterology;  Laterality: N/A;   ESOPHAGOGASTRODUODENOSCOPY (EGD) WITH PROPOFOL  N/A 06/06/2023   Procedure: ESOPHAGOGASTRODUODENOSCOPY (EGD) WITH PROPOFOL ;  Surgeon: Unk Corinn Skiff, MD;  Location: [facility] ENDOSCOPY;  Service: Gastroenterology;  Laterality: N/A;   EYE SURGERY  2007 uppers n lowers   Eyelid surgery both   NECK SURGERY  06/11/2016   Fusion C3-C7 Dr. Onetha   POSTERIOR CERVICAL LAMINECTOMY Bilateral 08/16/2022   Procedure: Bilateral Cervical Two ganglionectomies;  Surgeon: Cheryle Debby LABOR, MD;  Location: [facility] OR;  Service: Neurosurgery;  Laterality: Bilateral;   SMALL INTESTINE SURGERY  2007   Bowel resection   SPINE SURGERY  2018 Jan cervical   Fusion C-3-7   TONSILLECTOMY     TUBAL LIGATION  2009   VESICO-VAGINAL FISTULA REPAIR  2007    Social History:  reports that she has never smoked. She has never used smokeless tobacco. She reports current alcohol  use of about 3.0 standard drinks of alcohol  per week. She reports that she does not use drugs. Family History: family history includes Arthritis in her mother; Asthma in her mother; COPD in her mother; GER disease in her father and mother; Hearing loss  in her brother and father; Heart attack in her father; Hypertension in her father and mother; Osteoarthritis in her mother; Osteoporosis in her mother; Other in her mother; Polymyalgia rheumatica in her mother; Spondylolysis in her father.   HOME MEDICATIONS: Allergies as of 03/25/2024       Reactions   Bactrim [sulfamethoxazole-trimethoprim] Hives   Macrobid [nitrofurantoin Macrocrystal] Hives   Tizanidine Other (See Comments)   Bad Mood change Migraines   Vicodin [hydrocodone-acetaminophen ] Swelling   Puffy eyes and face   Trileptal  [oxcarbazepine ] Rash   Abilify [aripiprazole] Other (See Comments)   Bad leg cramps   Acetaminophen     Other reaction(s): Puffy face/eyes Patient states that she is able to take acetaminophen    Amaryl [glimepiride] Other (See Comments)   Not effective   Ambien  [zolpidem ] Other (See Comments)   Did not work   Cyclobenzaprine  Other (See Comments)   Not effective   Erythromycin Base    Other Reaction(s): Rash   Gabapentin Other (See Comments)   XR  makes her Groggy   Hydrocodone Swelling   Eyes and face   Klonopin [clonazepam] Other (See Comments)   Make her feel groggy   Nexium [esomeprazole Magnesium] Nausea And Vomiting   Face swelling, hives    Nortriptyline    insomnia   Olanzapine Other (See Comments)   Not effective   Pregabalin Other (See Comments)   Not effective   Prilosec [omeprazole] Nausea And Vomiting   Face swelling, hives    Quetiapine Other (See Comments)   Groggy   Reglan  [metoclopramide ] Nausea Only   Savella [milnacipran] Other (See Comments)   Not effective   Sulfa Antibiotics Hives   Tamoxifen    Tapentadol Other (See Comments)   Not effective   Topiramate Other (See Comments)   Not effective   Trimethoprim    Other reaction(s): Hives   Wasp Venom Hives   swelling   Zoloft  [sertraline ] Other (See Comments)   Not effective   Erythromycin Rash   Lamotrigine  Rash   Bad mood swings   Latex Rash   Molds &  Smuts Anxiety   Black Mold   Tramadol Nausea Only   Not effective        Medication List        Accurate as of March 25, 2024  3:11 PM. If you have any questions, ask your nurse or doctor.          STOP taking these medications    DULoxetine  HCl 40 MG Cpep Stopped by: Donell PARAS Carnesha Maravilla       TAKE these medications    albuterol  108 (90 Base) MCG/ACT inhaler Commonly known as: VENTOLIN  HFA Inhale 2 puffs into the lungs every 6 (six) hours as needed for wheezing or shortness of breath.   amLODipine  5 MG tablet Commonly known as: NORVASC  TAKE ONE TABLET BY MOUTH ONCE DAILY FOR BLOOD PRESSURE   amphetamine -dextroamphetamine  20 MG tablet Commonly known as: ADDERALL Take 20 mg by mouth See admin instructions. Take 1 tablet (20 mg) by mouth scheduled in the morning & in the afternoon, may take an additional dose in the evening, if needed for focus/attention.   aspirin  EC 81 MG tablet Take 1 tablet (81 mg total) by mouth in the morning.   carboxymethylcellulose 0.5 % Soln Commonly known as: REFRESH PLUS Place 1-2 drops into both eyes 3 (three) times daily as needed (dry/irritated eyes.).   cyanocobalamin  1000 MCG/ML injection Commonly known as: VITAMIN B12 INJECT ONE VIAL (1 ML) INTO THE MUSCLE EVERY THREE TO FOUR WEEKS   diazepam  5 MG tablet Commonly known as: VALIUM  Take 5 mg by mouth 4 (four) times daily as needed for anxiety.   EPINEPHrine  0.3 mg/0.3 mL Soaj injection Commonly known as: EPI-PEN Inject 0.3 mg into the muscle as needed for anaphylaxis.   estradiol  0.1 MG/GM vaginal cream Commonly known as: ESTRACE  Place 0.25 Applicatorfuls vaginally at bedtime. What changed:  when to take this reasons to take this   estradiol  1 MG tablet Commonly known as: ESTRACE  Take 1 tablet (1 mg total) by mouth daily.   gi cocktail Susp suspension Take 20 mLs by mouth 2 (two) times daily. Maalox 30 mL, lidocaine  5 mL and dicyclomine 10 mL, dispense 450 mL,   SIG take 15 to 20 mL as needed 3-4 times daily. What changed:  when to take this reasons to take this   irbesartan  75 MG tablet Commonly known as: AVAPRO  Take 0.5 tablets (37.5 mg total) by mouth daily. for blood pressure.  levothyroxine  100 MCG tablet Commonly known as: SYNTHROID  Take 1 tablet (100 mcg total) by mouth as directed. 1 tablet Monday through Saturday What changed: Another medication with the same name was removed. Continue taking this medication, and follow the directions you see here. Changed by: Ysela Hettinger J Jenney Brester   oxyCODONE -acetaminophen  7.5-325 MG tablet Commonly known as: PERCOCET Take 1 tablet by mouth 4 (four) times daily as needed.   pantoprazole  40 MG tablet Commonly known as: PROTONIX  TAKE ONE TABLET (40 MG TOTAL) BY MOUTH DAILY. FOR HEARTBURN.          REVIEW OF SYSTEMS: A comprehensive ROS was conducted with the patient and is negative except as per HPI    OBJECTIVE:  VS: BP (!) 152/88   Ht 5' 3.75 (1.619 m)   Wt 139 lb (63 kg)   BMI 24.05 kg/m   Wt Readings from Last 3 Encounters:  03/25/24 139 lb (63 kg)  03/20/24 135 lb (61.2 kg)  02/11/24 135 lb (61.2 kg)     EXAM: General: Pt appears well and is in NAD  Neck: General: Supple without adenopathy. Thyroid : No goiter or nodules appreciated.  Lungs: Clear with good BS bilat   Heart: Auscultation: RRR.  Mental Status: Judgment, insight: Intact Orientation: Oriented to time, place, and person Mood and affect: No depression, anxiety, or agitation     DATA REVIEWED:   Latest Reference Range & Units 03/25/24 15:40  TSH 0.40 - 4.50 mIU/L 4.95 (H)  T4,Free(Direct) 0.8 - 1.8 ng/dL 1.1  (H): Data is abnormally high   Latest Reference Range & Units 11/13/23 10:18  Sodium 135 - 145 mEq/L 139  Potassium 3.5 - 5.1 mEq/L 4.8  Chloride 96 - 112 mEq/L 103  CO2 19 - 32 mEq/L 27  Glucose 70 - 99 mg/dL 99  BUN 6 - 23 mg/dL 22  Creatinine 9.59 - 8.79 mg/dL 8.72 (H)  Calcium 8.4 -  10.5 mg/dL 9.5  Alkaline Phosphatase 39 - 117 U/L 66  Albumin 3.5 - 5.2 g/dL 4.4  AST 0 - 37 U/L 10  ALT 0 - 35 U/L 5  Total Protein 6.0 - 8.3 g/dL 7.2  Total Bilirubin 0.2 - 1.2 mg/dL 0.3  GFR >39.99 mL/min 43.57 (L)      ASSESSMENT/PLAN/RECOMMENDATIONS:   Hypothyroidism:  -Patient with fatigue -TSH elevated, will adjust the dose as below -No local neck symptoms -She feels better when her TSH is within mid range  Medications : Take levothyroxine  100 mcg x 6 days a week Take levothyroxine  112 mcg x  1 day a week (Sunday)   Follow-up in 6 months   I spent 25 minutes preparing to see the patient by review of recent labs, imaging and procedures, obtaining and reviewing separately obtained history, communicating with the patient ordering medications, tests or procedures, and documenting clinical information in the EHR including the differential Dx, treatment, and any further evaluation and other management    Signed electronically by: Stefano Redgie Butts, MD  [facility] Endocrinology  [facility] [address] [place] [place], [place] [address] Phone: [phone] FAX: [phone]   CC: Gretta Comer POUR, NP [address] [place] [address] Phone: [phone] Fax: [phone]   Return to Endocrinology clinic as below: Future Appointments  Date Time Provider Department Center  04/16/2024  2:30 PM Onita Duos, MD [facility] None  12/22/2024 11:20 AM Gretta Comer POUR, NP [facility] [address]  01/06/2025  1:40 PM [facility] ANNUAL WELLNESS VISIT 1 [facility] [address]

## 2024-03-26 ENCOUNTER — Encounter: Payer: Self-pay | Admitting: Internal Medicine

## 2024-03-26 MED ORDER — LEVOTHYROXINE SODIUM 112 MCG PO TABS: 112.0000 ug | ORAL_TABLET | ORAL | Status: AC

## 2024-03-26 MED ORDER — LEVOTHYROXINE SODIUM 100 MCG PO TABS
100.0000 ug | ORAL_TABLET | ORAL | Status: AC
Start: 2024-03-26 — End: ?

## 2024-04-03 ENCOUNTER — Other Ambulatory Visit: Payer: Self-pay | Admitting: Internal Medicine

## 2024-04-07 ENCOUNTER — Ambulatory Visit: Payer: Self-pay

## 2024-04-07 NOTE — Telephone Encounter (Signed)
 FYI Only or Action Required?: Action required by provider: clinical question for provider.  Patient was last seen in primary care on 12/20/2023 by Gretta Comer POUR, NP.  Called Nurse Triage reporting Medication Problem.  Triage Disposition: Call PCP Now  Patient/caregiver understands and will follow disposition?: Yes Copied from CRM [id number]. Topic: Clinical - Medication Question >> Apr 07, 2024  2:54 PM Frederich PARAS wrote: Reason for CRM: pt called in trying to fill medication olmesartan  20mg   for blood pressure, but the medication Is not on the list  Pt would like this filled. Pt callback# [phone]. Reason for Disposition  [1] [name] has URGENT medicine question about med that primary care doctor (or NP/PA) or specialist prescribed AND [2] triager unable to answer question  Answer Assessment - Initial Assessment Questions Patient reports she started herself back on Olmesartan  20 mg after stopping Avapro  in September. Patient states she was noticing increases of blood pressure while on Avapro . Patient is requesting to new prescription for the Olmesartan . Patient states blood pressure has been at her normal levels.   1. NAME of MEDICINE: What medicine(s) are you calling about?     Olmesartan  20 mg 2. QUESTION: What is your question? (e.g., double dose of medicine, side effect)     Patient states she restarted herself back on the Olmesartan  in September. Patient stopped the Avapro  with starting herself back on the Olmesartan . Patient is needing a new prescription for the Olmesartan . 3. PRESCRIBER: Who prescribed the medicine? Reason: if prescribed by specialist, call should be referred to that group.     Comer Gretta NP 4. SYMPTOMS: Do you have any symptoms? If Yes, ask: What symptoms are you having?  How bad are the symptoms (e.g., mild, moderate, severe)     no  Protocols used: Medication Question Call-A-AH

## 2024-04-07 NOTE — Telephone Encounter (Signed)
 Patient states would like to be on medication she was on for 20 years would like a call back from a Clinical team member.

## 2024-04-07 NOTE — Telephone Encounter (Signed)
 Please call patient:  She is currently prescribed irbesartan  for BP. Has she  been taking irbesartan  and olmesartan  for blood pressure?

## 2024-04-08 ENCOUNTER — Other Ambulatory Visit: Payer: Self-pay | Admitting: Primary Care

## 2024-04-08 DIAGNOSIS — I10 Essential (primary) hypertension: Secondary | ICD-10-CM

## 2024-04-08 MED ORDER — OLMESARTAN MEDOXOMIL 20 MG PO TABS
20.0000 mg | ORAL_TABLET | Freq: Every day | ORAL | 1 refills | Status: AC
Start: 2024-04-08 — End: ?

## 2024-04-08 NOTE — Telephone Encounter (Signed)
 Spoke with pt and she states that she is taking amlodipine  5mg  and olmesartan  20mg  daily.

## 2024-04-08 NOTE — Telephone Encounter (Signed)
 LM for pt to return my call

## 2024-04-08 NOTE — Telephone Encounter (Addendum)
Noted. Refill(s) sent to pharmacy.  

## 2024-04-08 NOTE — Telephone Encounter (Signed)
 Copied from CRM [id number]. Topic: Clinical - Medication Question >> Apr 08, 2024 10:32 AM Harlene ORN wrote: Reason for CRM: Patient called. Patient is having trouble having her BENICAR  medications refilled. Saw her Cardiologist on 09/19, after her PCP's Annual appointment. Please call back the patient to discuss.

## 2024-04-13 ENCOUNTER — Telehealth: Payer: Self-pay | Admitting: Neurology

## 2024-04-13 NOTE — Telephone Encounter (Signed)
 Phone room: Let pt know I added medication to allergy list so we shouldn't refill anymore and thank them for informing us 

## 2024-04-13 NOTE — Telephone Encounter (Signed)
 Pt asked to cx upcoming appointment because it is not needed .  Also pt asked it be noted that she as not taken Cymbalta  in months because of how it interacted with other meds, she does not want Dr Onita to call it in any longer.

## 2024-04-14 NOTE — Telephone Encounter (Signed)
 Pt was called, response from CMA was relayed.

## 2024-04-16 ENCOUNTER — Ambulatory Visit: Admitting: Neurology

## 2024-05-27 ENCOUNTER — Other Ambulatory Visit: Payer: Self-pay | Admitting: Internal Medicine

## 2024-05-27 ENCOUNTER — Other Ambulatory Visit

## 2024-05-27 DIAGNOSIS — E039 Hypothyroidism, unspecified: Secondary | ICD-10-CM

## 2024-05-28 ENCOUNTER — Ambulatory Visit: Payer: Self-pay | Admitting: Internal Medicine

## 2024-05-28 LAB — T4, FREE: Free T4: 1.1 ng/dL (ref 0.8–1.8)

## 2024-05-28 LAB — TSH: TSH: 0.72 m[IU]/L (ref 0.40–4.50)

## 2024-06-08 ENCOUNTER — Other Ambulatory Visit: Payer: Self-pay | Admitting: Primary Care

## 2024-06-08 DIAGNOSIS — Z79890 Hormone replacement therapy (postmenopausal): Secondary | ICD-10-CM

## 2024-06-09 NOTE — Telephone Encounter (Signed)
Noted. Refill(s) sent to pharmacy.  

## 2024-06-09 NOTE — Telephone Encounter (Signed)
 Lvm asking pt to call back. Pls get answers to [name]'s questions.

## 2024-06-09 NOTE — Telephone Encounter (Signed)
 Copied from CRM [id number]. Topic: Clinical - Medical Advice >> Jun 09, 2024  3:40 PM Rea ORN wrote: Reason for CRM: Pt returning CMA call. Pt stated she spoke with PCP prior that her gyno, Dr. Janit, told her due her no longer having her parts that he can check on, she didn't need to see her. He advised that she can go through her PCP to have Estrodiol prescribed. Pt said she mention this to PCP briefly at her last ov.

## 2024-06-09 NOTE — Telephone Encounter (Signed)
 Please call patient:  Who is she seeing for GYN now? Received refill request for estradiol . Did her prior GYN retire?

## 2024-06-18 ENCOUNTER — Ambulatory Visit: Admitting: Primary Care

## 2024-06-18 ENCOUNTER — Encounter: Payer: Self-pay | Admitting: Primary Care

## 2024-06-18 VITALS — BP 130/70 | HR 82 | Temp 97.8°F | Ht 63.0 in | Wt 138.1 lb

## 2024-06-18 DIAGNOSIS — G8929 Other chronic pain: Secondary | ICD-10-CM

## 2024-06-18 DIAGNOSIS — R519 Headache, unspecified: Secondary | ICD-10-CM

## 2024-06-18 DIAGNOSIS — R5382 Chronic fatigue, unspecified: Secondary | ICD-10-CM

## 2024-06-18 DIAGNOSIS — R7303 Prediabetes: Secondary | ICD-10-CM

## 2024-06-18 LAB — POC URINALSYSI DIPSTICK (AUTOMATED)
Bilirubin, UA: NEGATIVE
Blood, UA: NEGATIVE
Glucose, UA: NEGATIVE
Ketones, UA: NEGATIVE
Leukocytes, UA: NEGATIVE
Nitrite, UA: NEGATIVE
Protein, UA: POSITIVE — AB
Spec Grav, UA: 1.025
Urobilinogen, UA: 0.2 U/dL
pH, UA: 5.5

## 2024-06-18 NOTE — Patient Instructions (Signed)
 Stop by the lab prior to leaving today. I will notify you of your results once received.   It was a pleasure to see you today!

## 2024-06-18 NOTE — Progress Notes (Signed)
 "  Subjective:    Patient ID: Katie Robbins, female    DOB: [birth year], 69 y.o.   MRN: [id number]  Katie Robbins is a very pleasant 69 y.o. female with a history of hypertension, TIA, TBI with mild neurocognitive disorder, hypothyroidism, CKD, chronic neck pain status post cervical fusion who presents today to discuss several concerns  She underwent cervical spine fusion for chronic neck pain with radiculopathy in May 2025 per Dr. Onetha. Had failed multiple other treatments. Treated with Percocet. In July 2025 she was referred to a psychiatrist as she was told there was nothing more that could be done for her chronic neck pain. She was told by the psychiatrist that she may not get medication so she did not attend the appointment that was scheduled in October 2025.  She was evaluated at [facility] in November 2025 for chronic pain. She is now treated with Percocet 7.5-325 mg for which she takes 3-4 per day. She underwent cervical spine xrays per [facility] which showed C3-4 and C6-7 fusion, anterior metallic plating at C4-5, multiple level faucet disease.  She brings these results with her today for  She's here today to discuss ongoing symptoms of tight rubber band around the base of my skull with neck pain, neck stiffness, frontal and temporal lobe headaches, imbalanced, shortness of breath, fatigue, swelling to her hands,  feeling cold throughout her body, inability to lay her head flat, pain when rolling over in bed, inability to sleep due to her pain. She finds herself drinking alcohol  more frequently due to her pain.   She does notice slight improvement in her neck pain since taking Perocet as she's been more active during the day. She is getting out more often with her husband or friends. She only feels better for a few hours. January 1st she took an old prescription of prednisone  and most her symptoms significantly improved. As soon as she finished the prednisone  her symptoms  returned.   Follows with psychiatry, last office visit was in December 2025, no changes in her medication regimen of adderall 2 mg TID, valium  5 mg PRN.   Follows with cardiology, last office visit was November 2025. She underwent carotid duplex of the carotid arteries and the subclavian veins which did not show stenosis. Recommendation was for continued monitoring. She underwent CT angio head and neck in August 2025 which was without large vessel occlusion or aneurysm in the head or neck. There was a remote infarct in the right cerebellum and chronic small vessel disease.   She went back through her mother's medical records as her mother had similar symptoms. Her mother was diagnosed with Giant Cell and PMR. She underwent lab work in August 2025 which showed normal sed rate, negative RF, negative ANA., normal sed rate.  Her most recent sed rate was within normal range on 05/11/24. She has noticed her sed rate increase and is higher than ever. Her mother's sed rate often waxed and waned depending on severity of symptoms.   BP Readings from Last 3 Encounters:  06/18/24 130/70  03/25/24 130/80  03/20/24 132/78   Wt Readings from Last 3 Encounters:  06/18/24 138 lb 2 oz (62.7 kg)  03/25/24 139 lb (63 kg)  03/20/24 135 lb (61.2 kg)       Review of Systems  Constitutional:  Positive for fatigue.  Respiratory:  Positive for shortness of breath.   Musculoskeletal:  Positive for arthralgias and neck pain.  Neurological:  Positive for  light-headedness.  Psychiatric/Behavioral:  Positive for sleep disturbance.          Past Medical History:  Diagnosis Date   Acute thoracic back pain 07/22/2018   ADHD (attention deficit hyperactivity disorder)    Allergy    Anemia    Abnormal Uterine Bleeding - Resulted in Hysterectomy   Anxiety    Arthritis    Asthma    Blood transfusion without reported diagnosis 09/16/23   Dr Onetha   Chicken pox    Chronic headaches    Chronic kidney disease     CKD Stage 3a. No HD   Cognitive disorder    Concussion 07/2021   Depression    Diverticulitis    Electrocution 04/17/2022   Essential hypertension    GERD (gastroesophageal reflux disease)    Guillain Barr syndrome 1982   H/O breast implant    Heart murmur    Hypertension    Hypothyroidism    Interstitial cystitis    Interstitial cystitis    Mitral prolapse 1985   Myocardial infarction [facility])    Non-cardiac chest pain 06/13/2020   Nutcracker esophagus    Osteoarthritis    PONV (postoperative nausea and vomiting)    patient states that she does well with phenergan    Soft tissue mass 03/31/2020   Stroke (HCC)    Multiple TIAs. Last one Feb. 2024    Social History   Socioeconomic History   Marital status: Married    Spouse name: Reyes   Number of children: 2   Years of education: Some college   Highest education level: Some college, no degree  Occupational History   Occupation: Retired  Tobacco Use   Smoking status: Never   Smokeless tobacco: Never  Vaping Use   Vaping status: Never Used  Substance and Sexual Activity   Alcohol  use: Yes    Alcohol /week: 3.0 standard drinks of alcohol     Types: 2 Cans of beer, 1 Shots of liquor per week    Comment: occasional   Drug use: No   Sexual activity: Yes    Birth control/protection: Surgical  Other Topics Concern   Not on file  Social History Narrative   Lives with husband   Caffeine use: rare   Right handed   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  4 years active-2 years inactive   Social Drivers of Health   Tobacco Use: Low Risk (06/18/2024)   Patient History    Smoking Tobacco Use: Never    Smokeless Tobacco Use: Never    Passive Exposure: Not on file  Financial Resource Strain: Low Risk (06/15/2024)   Overall Financial Resource Strain (CARDIA)    Difficulty of Paying Living Expenses: Not hard at all  Food Insecurity: No Food Insecurity (06/15/2024)   Epic    Worried About Radiation Protection Practitioner of Food in the Last Year: Never true    Ran Out  of Food in the Last Year: Never true  Transportation Needs: No Transportation Needs (06/15/2024)   Epic    Lack of Transportation (Medical): No    Lack of Transportation (Non-Medical): No  Physical Activity: Inactive (06/15/2024)   Exercise Vital Sign    Days of Exercise per Week: 0 days    Minutes of Exercise per Session: Not on file  Stress: Stress Concern Present (06/15/2024)   [employer] of Occupational Health - Occupational Stress Questionnaire    Feeling of Stress: Very much  Social Connections: Socially Isolated (06/15/2024)   Social Connection and Isolation Panel    Frequency of  Communication with Friends and Family: Once a week    Frequency of Social Gatherings with Friends and Family: Once a week    Attends Religious Services: Patient declined    Active Member of Clubs or Organizations: No    Attends Engineer, Structural: Not on file    Marital Status: Married  Intimate Partner Violence: Not At Risk (01/03/2024)   Epic    Fear of Current or Ex-Partner: No    Emotionally Abused: No    Physically Abused: No    Sexually Abused: No  Depression (PHQ2-9): High Risk (06/18/2024)   Depression (PHQ2-9)    PHQ-2 Score: 24  Alcohol  Screen: Low Risk (06/15/2024)   Alcohol  Screen    Last Alcohol  Screening Score (AUDIT): 3  Housing: Low Risk (06/15/2024)   Epic    Unable to Pay for Housing in the Last Year: No    Number of Times Moved in the Last Year: 0    Homeless in the Last Year: No  Utilities: Not At Risk (01/03/2024)   Epic    Threatened with loss of utilities: No  Health Literacy: Adequate Health Literacy (01/03/2024)   B1300 Health Literacy    Frequency of need for help with medical instructions: Never    Past Surgical History:  Procedure Laterality Date   ABDOMINAL HYSTERECTOMY  2000/2009   ANTERIOR CERVICAL DECOMP/DISCECTOMY FUSION N/A 09/20/2023   Procedure: ANTERIOR CERVICAL DECOMPRESSION/DISCECTOMY FUSION CERVICAL FOUR-FIVE;  Surgeon: Onetha Kuba, MD;   Location: [facility] OR;  Service: Neurosurgery;  Laterality: N/A;  ACDF C45 3C   APPENDECTOMY  2007   AUGMENTATION MAMMAPLASTY     BELPHAROPTOSIS REPAIR Bilateral    Cosmetic. Eye Lift   Bilateral Cervical Two ganglionectomies  08/2022   BOWEL RESECTION  2007   CARDIAC CATHETERIZATION  2016   Clean   CARPAL TUNNEL RELEASE Bilateral    CHOLECYSTECTOMY  2012   COLON SURGERY  2007 abcess n fiscula   Bowel resection 13   COLONOSCOPY WITH PROPOFOL  N/A 04/13/2020   Procedure: COLONOSCOPY WITH PROPOFOL ;  Surgeon: Unk Corinn Skiff, MD;  Location: [facility] ENDOSCOPY;  Service: Gastroenterology;  Laterality: N/A;   COSMETIC SURGERY  2007   Breast augmentation   ESOPHAGOGASTRODUODENOSCOPY (EGD) WITH PROPOFOL  N/A 04/13/2020   Procedure: ESOPHAGOGASTRODUODENOSCOPY (EGD) WITH PROPOFOL ;  Surgeon: Unk Corinn Skiff, MD;  Location: [facility] ENDOSCOPY;  Service: Gastroenterology;  Laterality: N/A;   ESOPHAGOGASTRODUODENOSCOPY (EGD) WITH PROPOFOL  N/A 06/06/2023   Procedure: ESOPHAGOGASTRODUODENOSCOPY (EGD) WITH PROPOFOL ;  Surgeon: Unk Corinn Skiff, MD;  Location: [facility] ENDOSCOPY;  Service: Gastroenterology;  Laterality: N/A;   EYE SURGERY  2007 uppers n lowers   Eyelid surgery both   NECK SURGERY  06/11/2016   Fusion C3-C7 Dr. Onetha   POSTERIOR CERVICAL LAMINECTOMY Bilateral 08/16/2022   Procedure: Bilateral Cervical Two ganglionectomies;  Surgeon: Cheryle Debby LABOR, MD;  Location: [facility] OR;  Service: Neurosurgery;  Laterality: Bilateral;   SMALL INTESTINE SURGERY  2007   Bowel resection   SPINE SURGERY  2018 Jan cervical   Fusion C-3-7   TONSILLECTOMY     TUBAL LIGATION  2009   VESICO-VAGINAL FISTULA REPAIR  2007    Family History  Problem Relation Age of Onset   Hypertension Mother    GER disease Mother    Polymyalgia rheumatica Mother    COPD Mother    Osteoarthritis Mother    Osteoporosis Mother    Other Mother        Giant Cell Arteritis    Arthritis Mother  Asthma Mother    GER disease  Father    Heart attack Father    Spondylolysis Father    Hearing loss Father    Hypertension Father    Hearing loss Brother     Allergies[1]  Medications Ordered Prior to Encounter[2]  BP 130/70   Pulse 82   Temp 97.8 F (36.6 C) (Oral)   Ht 5' 3 (1.6 m)   Wt 138 lb 2 oz (62.7 kg)   SpO2 98%   BMI 24.47 kg/m  Objective:   Physical Exam Cardiovascular:     Rate and Rhythm: Normal rate and regular rhythm.  Pulmonary:     Effort: Pulmonary effort is normal.     Breath sounds: Normal breath sounds.  Musculoskeletal:     Cervical back: Neck supple.  Skin:    General: Skin is warm and dry.  Neurological:     Mental Status: She is alert and oriented to person, place, and time.  Psychiatric:        Mood and Affect: Mood normal.     Physical Exam        Assessment & Plan:  Chronic neck pain Assessment & Plan: Slight improvement since establishment with pain management.  Continue Percocet 7.5-325 mg, 1 tablet 3-4 times daily.  Will repeat labs today including CRP, CCP, sed rate, CK, CMP, urine protein electrophoresis. Urinalysis today negative for infection.  Given the longevity of her symptoms, coupled with little improvement despite surgeries and other treatments, consider low-dose prednisone  as this is most effective.  We discussed the potential side effects from chronic prednisone  use, she understands.  Orders: -     Sedimentation rate -     C-reactive protein -     Comprehensive metabolic panel with GFR -     POCT Urinalysis Dipstick (Automated) -     Cyclic citrul peptide antibody, IgG -     Protein Electrophoresis,Random Urn -     CK -     CBC with Differential/Platelet  Frequent headaches -     Sedimentation rate -     C-reactive protein -     Comprehensive metabolic panel with GFR -     POCT Urinalysis Dipstick (Automated) -     Cyclic citrul peptide antibody, IgG -     Protein Electrophoresis,Random Urn -     CK -     CBC with  Differential/Platelet  Chronic fatigue Assessment & Plan: Likely multifactorial given her history.  Do agree that her chronic pain is contributing. Labs pending today.  Await results  Thyroid  is well-controlled based on recent labs   Orders: -     Sedimentation rate -     C-reactive protein -     Comprehensive metabolic panel with GFR -     POCT Urinalysis Dipstick (Automated) -     Cyclic citrul peptide antibody, IgG -     Protein Electrophoresis,Random Urn -     CK -     CBC with Differential/Platelet  Prediabetes -     Hemoglobin A1c  Chronic nonintractable headache, unspecified headache type Assessment & Plan: Secondary to chronic neck pain.  Labs pending today. See notes under chronic neck pain.     Assessment and Plan Assessment & Plan   I personally spent a total of 45 minutes in the care of the patient today including preparing to see the patient, getting/reviewing separately obtained history, performing a medically appropriate exam/evaluation, counseling and educating, placing orders, documenting clinical information in  the EHR, and independently interpreting results.       Comer MARLA Gaskins, NP       [1]  Allergies Allergen Reactions   Bactrim [Sulfamethoxazole-Trimethoprim] Hives   Macrobid [Nitrofurantoin Macrocrystal] Hives   Tizanidine Other (See Comments)    Bad Mood change Migraines   Vicodin [Hydrocodone-Acetaminophen ] Swelling    Puffy eyes and face   Trileptal  [Oxcarbazepine ] Rash   Abilify [Aripiprazole] Other (See Comments)    Bad leg cramps   Acetaminophen      Other reaction(s): Puffy face/eyes Patient states that she is able to take acetaminophen    Amaryl [Glimepiride] Other (See Comments)    Not effective   Ambien  [Zolpidem ] Other (See Comments)    Did not work   Cyclobenzaprine  Other (See Comments)    Not effective   Duloxetine     Erythromycin Base     Other Reaction(s): Rash   Gabapentin Other (See Comments)    XR  makes her Groggy   Hydrocodone Swelling    Eyes and face   Klonopin [Clonazepam] Other (See Comments)    Make her feel groggy   Nexium [Esomeprazole Magnesium] Nausea And Vomiting    Face swelling, hives    Nortriptyline     insomnia   Olanzapine Other (See Comments)    Not effective   Pregabalin Other (See Comments)    Not effective   Prilosec [Omeprazole] Nausea And Vomiting    Face swelling, hives    Quetiapine Other (See Comments)    Groggy   Reglan  [Metoclopramide ] Nausea Only   Savella [Milnacipran] Other (See Comments)    Not effective   Sulfa Antibiotics Hives   Tamoxifen    Tapentadol Other (See Comments)    Not effective   Topiramate Other (See Comments)    Not effective   Trimethoprim     Other reaction(s): Hives   Wasp Venom Hives    swelling   Zoloft  Esdras.favorite ] Other (See Comments)    Not effective   Erythromycin Rash   Lamotrigine  Rash    Bad mood swings   Latex Rash   Molds & Smuts Anxiety    Black Mold   Tramadol Nausea Only    Not effective  [2]  Current Outpatient Medications on File Prior to Visit  Medication Sig Dispense Refill   albuterol  (VENTOLIN  HFA) 108 (90 Base) MCG/ACT inhaler Inhale 2 puffs into the lungs every 6 (six) hours as needed for wheezing or shortness of breath. 8.5 g 5   Alum & Mag Hydroxide-Simeth (GI COCKTAIL) SUSP suspension Take 20 mLs by mouth 2 (two) times daily. Maalox 30 mL, lidocaine  5 mL and dicyclomine 10 mL, dispense 450 mL,  SIG take 15 to 20 mL as needed 3-4 times daily. (Patient taking differently: Take 20 mLs by mouth as needed. Maalox 30 mL, lidocaine  5 mL and dicyclomine 10 mL, dispense 450 mL,  SIG take 15 to 20 mL as needed 3-4 times daily.) 1200 mL 1   amLODipine  (NORVASC ) 5 MG tablet TAKE ONE TABLET BY MOUTH ONCE DAILY FOR BLOOD PRESSURE 90 tablet 2   amphetamine -dextroamphetamine  (ADDERALL) 20 MG tablet Take 20 mg by mouth See admin instructions. Take 1 tablet (20 mg) by mouth scheduled in the morning & in  the afternoon, may take an additional dose in the evening, if needed for focus/attention.     aspirin  EC 81 MG tablet Take 1 tablet (81 mg total) by mouth in the morning. 30 tablet 12   Budesonide -Formoterol  Fumarate (SYMBICORT  IN) Inhale  into the lungs. Takes 2 puffs twice a day as needed     carboxymethylcellulose (REFRESH PLUS) 0.5 % SOLN Place 1-2 drops into both eyes 3 (three) times daily as needed (dry/irritated eyes.).     cyanocobalamin  (VITAMIN B12) 1000 MCG/ML injection INJECT ONE VIAL (1 ML) INTO THE MUSCLE EVERY THREE TO FOUR WEEKS 12 mL 0   diazepam  (VALIUM ) 5 MG tablet Take 5 mg by mouth 4 (four) times daily as needed for anxiety. (Patient taking differently: Take 5 mg by mouth 4 (four) times daily as needed for anxiety. Takes 1 tablet once a day)     EPINEPHrine  0.3 mg/0.3 mL IJ SOAJ injection Inject 0.3 mg into the muscle as needed for anaphylaxis. 1 each 0   estradiol  (ESTRACE ) 1 MG tablet Take 1 tablet (1 mg total) by mouth daily. 90 tablet 1   levothyroxine  (SYNTHROID ) 100 MCG tablet Take 1 tablet (100 mcg total) by mouth as directed. 1 tablet Monday through Saturday     levothyroxine  (SYNTHROID ) 112 MCG tablet Take 1 tablet (112 mcg total) by mouth as directed. Every Sunday only     olmesartan  (BENICAR ) 20 MG tablet Take 1 tablet (20 mg total) by mouth daily. for blood pressure. 90 tablet 1   oxyCODONE -acetaminophen  (PERCOCET) 7.5-325 MG tablet Take 1 tablet by mouth 4 (four) times daily as needed.     pantoprazole  (PROTONIX ) 40 MG tablet TAKE ONE TABLET (40 MG TOTAL) BY MOUTH DAILY. FOR HEARTBURN. 90 tablet 2   No current facility-administered medications on file prior to visit.   "

## 2024-06-18 NOTE — Assessment & Plan Note (Signed)
 Secondary to chronic neck pain.  Labs pending today. See notes under chronic neck pain.

## 2024-06-18 NOTE — Assessment & Plan Note (Signed)
 Likely multifactorial given her history.  Do agree that her chronic pain is contributing. Labs pending today.  Await results  Thyroid  is well-controlled based on recent labs

## 2024-06-18 NOTE — Assessment & Plan Note (Addendum)
 Slight improvement since establishment with pain management.  Continue Percocet 7.5-325 mg, 1 tablet 3-4 times daily.  Will repeat labs today including CRP, CCP, sed rate, CK, CMP, urine protein electrophoresis. Urinalysis today negative for infection.  Given the longevity of her symptoms, coupled with little improvement despite surgeries and other treatments, consider low-dose prednisone  as this is most effective.  We discussed the potential side effects from chronic prednisone  use, she understands.

## 2024-06-19 LAB — COMPREHENSIVE METABOLIC PANEL WITH GFR
ALT: 5 U/L (ref 3–35)
AST: 12 U/L (ref 5–37)
Albumin: 4.4 g/dL (ref 3.5–5.2)
Alkaline Phosphatase: 75 U/L (ref 39–117)
BUN: 17 mg/dL (ref 6–23)
CO2: 26 meq/L (ref 19–32)
Calcium: 9.1 mg/dL (ref 8.4–10.5)
Chloride: 104 meq/L (ref 96–112)
Creatinine, Ser: 1.21 mg/dL — ABNORMAL HIGH (ref 0.40–1.20)
GFR: 45.98 mL/min — ABNORMAL LOW
Glucose, Bld: 95 mg/dL (ref 70–99)
Potassium: 5.3 meq/L — ABNORMAL HIGH (ref 3.5–5.1)
Sodium: 138 meq/L (ref 135–145)
Total Bilirubin: 0.3 mg/dL (ref 0.2–1.2)
Total Protein: 7.3 g/dL (ref 6.0–8.3)

## 2024-06-19 LAB — CBC WITH DIFFERENTIAL/PLATELET
Basophils Absolute: 0.1 10*3/uL (ref 0.0–0.1)
Basophils Relative: 1.1 % (ref 0.0–3.0)
Eosinophils Absolute: 0 10*3/uL (ref 0.0–0.7)
Eosinophils Relative: 0.8 % (ref 0.0–5.0)
HCT: 35.2 % — ABNORMAL LOW (ref 36.0–46.0)
Hemoglobin: 11.6 g/dL — ABNORMAL LOW (ref 12.0–15.0)
Lymphocytes Relative: 48 % — ABNORMAL HIGH (ref 12.0–46.0)
Lymphs Abs: 2.7 10*3/uL (ref 0.7–4.0)
MCHC: 33 g/dL (ref 30.0–36.0)
MCV: 88.4 fl (ref 78.0–100.0)
Monocytes Absolute: 0.4 10*3/uL (ref 0.1–1.0)
Monocytes Relative: 6.8 % (ref 3.0–12.0)
Neutro Abs: 2.4 10*3/uL (ref 1.4–7.7)
Neutrophils Relative %: 43.3 % (ref 43.0–77.0)
Platelets: 259 10*3/uL (ref 150.0–400.0)
RBC: 3.98 Mil/uL (ref 3.87–5.11)
RDW: 14.5 % (ref 11.5–15.5)
WBC: 5.6 10*3/uL (ref 4.0–10.5)

## 2024-06-19 LAB — CK: Total CK: 51 U/L (ref 17–177)

## 2024-06-19 LAB — C-REACTIVE PROTEIN: CRP: 0.7 mg/dL — ABNORMAL LOW (ref 1.0–20.0)

## 2024-06-19 LAB — SEDIMENTATION RATE: Sed Rate: 5 mm/h (ref 0–30)

## 2024-06-19 LAB — HEMOGLOBIN A1C: Hgb A1c MFr Bld: 5.8 % (ref 4.6–6.5)

## 2024-09-23 ENCOUNTER — Ambulatory Visit: Admitting: Internal Medicine

## 2024-12-22 ENCOUNTER — Encounter: Admitting: Primary Care

## 2025-01-06 ENCOUNTER — Ambulatory Visit
